# Patient Record
Sex: Male | Born: 1935 | Race: White | Hispanic: No | Smoking: Former smoker
Health system: Southern US, Community
[De-identification: ages and names within clinical notes are randomized; demographics above are authoritative.]

## PROBLEM LIST (undated history)

## (undated) DIAGNOSIS — M1990 Unspecified osteoarthritis, unspecified site: Secondary | ICD-10-CM

## (undated) DIAGNOSIS — R1310 Dysphagia, unspecified: Secondary | ICD-10-CM

## (undated) DIAGNOSIS — I4891 Unspecified atrial fibrillation: Secondary | ICD-10-CM

## (undated) DIAGNOSIS — N400 Enlarged prostate without lower urinary tract symptoms: Secondary | ICD-10-CM

## (undated) DIAGNOSIS — E079 Disorder of thyroid, unspecified: Secondary | ICD-10-CM

## (undated) DIAGNOSIS — Z972 Presence of dental prosthetic device (complete) (partial): Secondary | ICD-10-CM

## (undated) DIAGNOSIS — E785 Hyperlipidemia, unspecified: Secondary | ICD-10-CM

## (undated) DIAGNOSIS — I10 Essential (primary) hypertension: Secondary | ICD-10-CM

## (undated) DIAGNOSIS — G20 Parkinson's disease: Secondary | ICD-10-CM

## (undated) DIAGNOSIS — I509 Heart failure, unspecified: Secondary | ICD-10-CM

## (undated) DIAGNOSIS — M4800 Spinal stenosis, site unspecified: Secondary | ICD-10-CM

## (undated) DIAGNOSIS — Z973 Presence of spectacles and contact lenses: Secondary | ICD-10-CM

## (undated) DIAGNOSIS — F0390 Unspecified dementia without behavioral disturbance: Secondary | ICD-10-CM

## (undated) DIAGNOSIS — N289 Disorder of kidney and ureter, unspecified: Secondary | ICD-10-CM

## (undated) DIAGNOSIS — G20A1 Parkinson's disease without dyskinesia, without mention of fluctuations: Secondary | ICD-10-CM

## (undated) HISTORY — DX: Unspecified osteoarthritis, unspecified site: M19.90

## (undated) HISTORY — DX: Unspecified atrial fibrillation: I48.91

## (undated) HISTORY — PX: PACEMAKER INSERTION: SHX728

## (undated) HISTORY — PX: TONSILLECTOMY: SUR1361

## (undated) HISTORY — DX: Essential (primary) hypertension: I10

## (undated) HISTORY — PX: EYE SURGERY: SHX253

## (undated) HISTORY — DX: Heart failure, unspecified: I50.9

## (undated) HISTORY — DX: Disorder of kidney and ureter, unspecified: N28.9

## (undated) HISTORY — PX: OTHER SURGICAL HISTORY: SHX169

---

## 2009-07-09 ENCOUNTER — Encounter: Payer: Self-pay | Admitting: Internal Medicine

## 2009-07-10 ENCOUNTER — Encounter: Payer: Self-pay | Admitting: Internal Medicine

## 2009-12-27 ENCOUNTER — Encounter (INDEPENDENT_AMBULATORY_CARE_PROVIDER_SITE_OTHER): Payer: Self-pay | Admitting: *Deleted

## 2010-02-09 ENCOUNTER — Emergency Department (HOSPITAL_COMMUNITY): Admission: EM | Admit: 2010-02-09 | Discharge: 2010-02-09 | Payer: Self-pay | Admitting: Emergency Medicine

## 2010-02-19 ENCOUNTER — Ambulatory Visit: Payer: Self-pay | Admitting: Internal Medicine

## 2010-02-19 DIAGNOSIS — N259 Disorder resulting from impaired renal tubular function, unspecified: Secondary | ICD-10-CM | POA: Insufficient documentation

## 2010-02-19 DIAGNOSIS — I4891 Unspecified atrial fibrillation: Secondary | ICD-10-CM | POA: Insufficient documentation

## 2010-02-19 DIAGNOSIS — Z950 Presence of cardiac pacemaker: Secondary | ICD-10-CM | POA: Insufficient documentation

## 2010-02-19 LAB — CONVERTED CEMR LAB
BUN: 22 mg/dL (ref 6–23)
CO2: 30 meq/L (ref 19–32)
Calcium: 8.5 mg/dL (ref 8.4–10.5)
Chloride: 105 meq/L (ref 96–112)
Creatinine, Ser: 1.1 mg/dL (ref 0.4–1.5)
Digitoxin Lvl: <0.1 ng/mL — ABNORMAL LOW (ref 0.8–2.0)
GFR calc non Af Amer: 65.44 mL/min (ref >60)
Glucose, Bld: 124 mg/dL — ABNORMAL HIGH (ref 70–99)
Potassium: 4.4 meq/L (ref 3.5–5.1)
Sodium: 138 meq/L (ref 135–145)

## 2010-02-21 ENCOUNTER — Telehealth: Payer: Self-pay | Admitting: Internal Medicine

## 2010-03-28 ENCOUNTER — Ambulatory Visit: Payer: Self-pay | Admitting: Internal Medicine

## 2010-06-27 ENCOUNTER — Ambulatory Visit: Payer: Self-pay | Admitting: Internal Medicine

## 2010-08-27 NOTE — Assessment & Plan Note (Signed)
Summary: nep/ pt has device/ gd   CC:  new patient.  Pacemaker check.  Pt says that Dr. Sharyn Lull started him on carvedilol once daily.  History of Present Illness: [name] is seen at the reqeust of Dr Sharyn Lull  to establish device follouwp for pacer implant undertaken in 2001 for atrial fibrillation and bradycardia. He underwent generator replacement in 2006 with a [employer] device.  He has hd no symptoms of CHF although he was told by the [place] that he had CHF.  A recent echo by Dr Sharyn Lull showed impaired EF, 30-35% just recently.  He is able to climb 3 flts of stairs twice, denies edema, palpitations , lightheadedness or presyncope.  He has had no chest pressure.  He takes coumadin for TE risk reduction.    He has had a stress test by history, but never a cath.  Denies prior MI  Dr. Annitta Jersey notes were reviewed  Current Medications (verified): 1)  Apap 325 Mg Tabs (Acetaminophen) .... Take 2 Tablets As Needed For Pain 2)  Tramadol Hcl 50 Mg Tabs (Tramadol Hcl) .... 2 To 4 Tablets As Needed For Pain 3)  Folic Acid 400 Mcg Tabs (Folic Acid) .... Once Daily 4)  Finasteride 5 Mg Tabs (Finasteride) .... Take One Tablet Once Daily 5)  Tamsulosin Hcl 0.4 Mg Caps (Tamsulosin Hcl) .... Once Daily 6)  Wellbutrin 100 Mg Tabs (Bupropion Hcl) .... Take 2 Tablets Once Daily 7)  Fish Oil 1000 Mg Caps (Omega-3 Fatty Acids) .... Take One Capsule Two Times A Day 8)  Vitamin D3 5000 Unit Caps (Cholecalciferol) .... Once Daily 9)  Vitamin B-1 100 Mg Tabs (Thiamine Hcl) .... Once Daily 10)  Vitamin B-12 500 Mcg Tabs (Cyanocobalamin) .... Once Daily 11)  Active-Q 200-30 Mg Caps (Ubiquinol-Vitamin C) .... Once Daily 12)  Lisinopril 10 Mg Tabs (Lisinopril) .... Take One Tablet Once Daily 13)  Spironolactone 25 Mg Tabs (Spironolactone) .... Once Daily 14)  Simvastatin 40 Mg Tabs (Simvastatin) .... Take One Tablet At Bedtime 15)  Warfarin Sodium 2 Mg Tabs (Warfarin Sodium) .... Uad 16)  Vitamin K 100 Mcg  Tabs (Vitamin K) .... Take One Tablet Once Daily 17)  Digoxin 0.125 Mg Tabs (Digoxin) .... Take One Tablet Once Daily 18)  Mirtazapine 15 Mg Tabs (Mirtazapine) .... Take 1/2 Tablet Once Daily 19)  Coreg 3.125 Mg Tabs (Carvedilol) .... Once Daily  Allergies (verified): 1)  ! * Amiodarone  Past History:  Family History: Last updated: 04-Jan-2010 Father deceased age 31--CVA Mother deceased old age  Social History: Last updated: 02/19/2010 Retired  Widowed no children Tobacco Use - Former.  Alcohol Use - yes-socially  Past Medical History: CHF atrial fibrillation HTN status post pacemaker arthrits, incl spinal stenosis renal insufficiency question degree  Past Surgical History: bilateral shoulder surgery tonsillectomy Prosthetic hip bilaterally R knee quadricep tendon repair  Social History: Retired  Widowed no children Tobacco Use - Former.  Alcohol Use - yes-socially  Review of Systems       full review of systems was negative apart from a history of present illness and past medical history except hemmorrhoids, remote issues with thyroid, and floaters  Vital Signs:  Patient profile:   77 year old male Height:      68 inches Weight:      174 pounds BMI:     26.55 Pulse rate:   60 / minute Pulse rhythm:   irregular BP sitting:   117 / 73  (right arm) Cuff size:  regular  Vitals Entered By: Judithe Modest CMA (February 19, 2010 2:49 PM)  Physical Exam  General:  Well developed, well nourished, in no acute distress. Head:  HEENT Neck:  supple without thyromegaly Chest Wall:  without CVA tenderness or scoliosis Lungs:  clear to auscultation Heart:  irregular rate and rhythm with no significant murmurs or gallop Abdomen:  soft nontender without hepatomegaly or midline pulsation; no abdominal bruit Msk:  normal gait; impaired motion of his arms bilaterally Extremities:  without clubbing cyanosis or edema Neurologic:  alert and oriented grossly normal motor and   sensory function Skin:  warm and dry Cervical Nodes:  no lynmphadenopathy Psych:  affect normal     PPM Specifications Following MD:  Sherryl Manges, MD     PPM Vendor:  [facility] [employer]     PPM Model Number:  [id number]     PPM Serial Number:  [id number] PPM DOI:  03/07/2005     PPM Implanting MD:  NOT IMPLANTED BY [place]  Lead 1    Location: RV     DOI: 10/27/1998     Model #: [id number]     Serial #: [id number]     Status: active  Magnet Response Rate:  BOL 100 ERI  85  Indications:  A-fib   PPM Follow Up Remote Check?  No Battery Voltage:  good V     Battery Est. Longevity:  5 years     Pacer Dependent:  No     Right Ventricle  Amplitude: 8.2 mV, Impedance: 430 ohms, Threshold: 0.7 V at 0.4 msec  Episodes Coumadin:  Yes Ventricular Pacing:  58%  Parameters Mode:  SSIR     Lower Rate Limit:  60     Upper Rate Limit:  120 Next Cardiology Appt Due:  07/28/2010 Tech Comments:  No parameter changes.  The last 2 high rate episodes recorded in July of this year were noise on the lead which I was unable to reproduce.  We will set him up for TTM's with [employer] every 3 months and see him back in the clinic in 6 months. Altha Harm, LPN  February 19, 2010 3:23 PM   Impression & Recommendations:  Problem # 1:  PACEMAKER, PERMANENT (ICD-V45.01) tpatient has atrial fibrillation and bradycardia. He is status post pacemaker implantation and revision in 2006. There's an episode in July 2010 associated with high frequency artifactual signals. I suspect this represents external exposure to EMI as opposed to oversensing from an nternal signal, ie mypotential.  the patient is alerted to let us know if he has symptoms of lightheadedness. Otherwise the device functions normally.  He is 58% ventricularly paced and heart rate excursion is not too excessive  Problem # 2:  ATRIAL FIBRILLATION (ICD-427.31) Pt has permanent atrial fibrillation for whichhe takes coumadin(but also VitK ????)  becasue of difficulties with INR  control.  I [facility] as an alternative His updated medication list for this problem includes:    Warfarin Sodium 2 Mg Tabs (Warfarin sodium) ..... Uad    Digoxin 0.125 Mg Tabs (Digoxin) .Marland Kitchen... Take one tablet once daily    Coreg 3.125 Mg Tabs (Carvedilol) .Marland Kitchen... Take 1 tablet twice daily for 2 weeks until you see dr Sharyn Lull, then increase to 2 tablets twice daily  Problem # 3:  RENAL INSUFFICIENCY (ICD-588.9) With hthis history, we will check his BMET and Dig level.  I am also concerned about the aldactone ace with this  history.   Orders: TLB-BMP (Basic Metabolic Panel-BMET) (80048-METABOL) TLB-Digoxin (Lanoxin) (80162-DIG)  Problem # 4:  CARDIOMYOPATHY-PRESUMED NONISCHEMIC (ICD-425.4) the patient has a presumed nonischemic cardio myopathy with recent ejection fraction of 30-35%.  we sent a long time discussing the advantages of medications for risk reduction. In this regard we will further uptitrate his carvedilol to twice a day and then to 6.25 twice a day in anticipation is seeing Dr. [facility] again.  given his age and the just recent addition of beta blocker therapy, ICD upgrade is not an appropriate discussion His updated medication list for this problem includes:    Lisinopril 10 Mg Tabs (Lisinopril) .Marland Kitchen... Take one tablet once daily    Spironolactone 25 Mg Tabs (Spironolactone) ..... Once daily    Warfarin Sodium 2 Mg Tabs (Warfarin sodium) ..... Uad    Digoxin 0.125 Mg Tabs (Digoxin) .Marland Kitchen... Take one tablet once daily    Coreg 3.125 Mg Tabs (Carvedilol) .Marland Kitchen... Take 1 tablet twice daily for 2 weeks until you see dr Sharyn Lull, then increase to 2 tablets twice daily  Problem # 5:  BRADYCARDIA (ICD-427.81) as above His updated medication list for this problem includes:    Lisinopril 10 Mg Tabs (Lisinopril) .Marland Kitchen... Take one tablet once daily    Warfarin Sodium 2 Mg Tabs (Warfarin sodium) ..... Uad    Coreg 3.125 Mg Tabs (Carvedilol) .Marland Kitchen... Take 1  tablet twice daily for 2 weeks until you see dr Sharyn Lull, then increase to 2 tablets twice daily  Patient Instructions: 1)  Your physician has recommended you make the following change in your medication: Increase Carvedilol to 3.125mg  1 tablet twice daily for 2 weeks until you see Dr Sharyn Lull, then increase to 2 tablets twice daily.  Change Simvastatin to Pravastatin 20mg  1 tablet daily. 2)  Your physician recommends that you return for lab work today. 3)  Your physician wants you to follow-up in: 12 months with Dr Graciela Husbands.  You will receive a reminder letter in the mail two months in advance. If you don't receive a letter, please call our office to schedule the follow-up appointment. Prescriptions: DIGOXIN 0.125 MG TABS (DIGOXIN) take one tablet once daily  #30 x 11   Entered by:   Optometrist BSN   Authorized by:   Nathen May, MD, [facility]   Signed by:   Gypsy Balsam RN BSN on 02/19/2010   Method used:   Electronically to        [facility]  [phone]* (retail)       [address]       [place], [place]  [address]       Ph: [phone] or [phone]       Fax: [phone]   RxID:   [id number] SPIRONOLACTONE 25 MG TABS (SPIRONOLACTONE) once daily  #30 x 11   Entered by:   Optometrist BSN   Authorized by:   Nathen May, MD, [facility]   Signed by:   Gypsy Balsam RN BSN on 02/19/2010   Method used:   Electronically to        [facility]  [phone]* (retail)       [address]       [place], [place]  [address]       Ph: [phone] or [phone]       Fax: [phone]   RxID:   [id number] LISINOPRIL 10  MG TABS (LISINOPRIL) take one tablet once daily  #30 x 11   Entered by:   Optometrist BSN   Authorized by:   Nathen May, MD, [facility]   Signed by:   Gypsy Balsam RN BSN on 02/19/2010   Method used:   Electronically to        [facility]  [phone]* (retail)       [address]       [place], [place]  [address]       Ph: [phone] or [phone]       Fax: [phone]   RxID:   [id number] PRAVASTATIN SODIUM 20 MG TABS (PRAVASTATIN SODIUM) Take one tablet by mouth daily at bedtime  #30 x 11   Entered by:   Optometrist BSN   Authorized by:   Nathen May, MD, [facility]   Signed by:   Gypsy Balsam RN BSN on 02/19/2010   Method used:   Electronically to        [facility]  [phone]* (retail)       [address]       [place], [place]  [address]       Ph: [phone] or [phone]       Fax: [phone]   RxID:   [id number] COREG 3.125 MG TABS (CARVEDILOL) Take 1 tablet twice daily for 2 weeks until you see Dr Sharyn Lull, then increase to 2 tablets twice daily  #120 x 2   Entered by:   Optometrist BSN   Authorized by:   Nathen May, MD, [facility]   Signed by:   Gypsy Balsam RN BSN on 02/19/2010   Method used:   Electronically to        [facility]  [phone]* (retail)       [address]       [place], [place]  [address]       Ph: [phone] or [phone]       Fax: [phone]   RxID:   [id number]

## 2010-08-27 NOTE — Letter (Signed)
Summary: Appointment - Missed  [facility], Main Office  [address]   [place], [place] [address]   Phone: [phone]  Fax: [phone]     December 27, 2009 MRN: [id number]   [name] [address] [place], [place]  [address]   Dear [name],  Our records indicate you missed your appointment on 12-20-09 with Dr Graciela Husbands. It is very important that we reach you to reschedule this appointment. We look forward to participating in your health care needs. Please contact us at the number listed above at your earliest convenience to reschedule this appointment.     Sincerely,   Ruel Favors Scheduling Team

## 2010-08-27 NOTE — Cardiovascular Report (Signed)
Summary: TTM   TTM   Imported By: Roderic Ovens 04/24/2010 11:32:50  _____________________________________________________________________  External Attachment:    Type:   Image     Comment:   External Document

## 2010-08-27 NOTE — Cardiovascular Report (Signed)
Summary: TTM   TTM   Imported By: Roderic Ovens 05/08/2010 16:10:24  _____________________________________________________________________  External Attachment:    Type:   Image     Comment:   External Document

## 2010-08-27 NOTE — Progress Notes (Signed)
Summary: [name] regarding medication-lm  Phone Note Call from Patient Call back at [facility] Phone [phone]   Caller: Patient Summary of Call: Pt have question about medication Initial call taken by: Judie Grieve,  February 21, 2010 2:30 PM  Follow-up for Phone Call        [facility] for pt to call us back Dennis Bast, RN, BSN  February 21, 2010 4:54 PM  Additional Follow-up for Phone Call Additional follow up Details #1::        LMTCB Lisabeth Devoid RN BSN February 22, 2010 @ 0840 [name] called back to verify dosages of his current medications ( Lisinopril, spironolactone, & Acetaminophen).  Med list updated. Lisabeth Devoid RN    New/Updated Medications: APAP 325 MG TABS (ACETAMINOPHEN) take 2 tablets in the morning and 2 tablets in the afternoon daily. LISINOPRIL 10 MG TABS (LISINOPRIL) take 1/2 tablet daily SPIRONOLACTONE 25 MG TABS (SPIRONOLACTONE) Take 1/2 tablet daily.

## 2010-08-27 NOTE — Consult Note (Signed)
Summary: [facility]   Imported By: Marylou Mccoy 12/28/2009 15:41:34  _____________________________________________________________________  External Attachment:    Type:   Image     Comment:   External Document

## 2010-08-29 NOTE — Cardiovascular Report (Signed)
Summary: TTM   TTM   Imported By: Roderic Ovens 07/12/2010 15:10:03  _____________________________________________________________________  External Attachment:    Type:   Image     Comment:   External Document

## 2010-09-26 ENCOUNTER — Encounter: Payer: Self-pay | Admitting: Internal Medicine

## 2010-09-26 DIAGNOSIS — I4891 Unspecified atrial fibrillation: Secondary | ICD-10-CM

## 2010-10-12 LAB — DIFFERENTIAL
Basophils Absolute: 0 10*3/uL (ref 0.0–0.1)
Basophils Relative: 0 % (ref 0–1)
Eosinophils Absolute: 0 10*3/uL (ref 0.0–0.7)
Eosinophils Relative: 0 % (ref 0–5)
Lymphocytes Relative: 15 % (ref 12–46)
Lymphs Abs: 0.8 10*3/uL (ref 0.7–4.0)
Monocytes Absolute: 0.3 10*3/uL (ref 0.1–1.0)
Monocytes Relative: 5 % (ref 3–12)
Neutro Abs: 4.5 10*3/uL (ref 1.7–7.7)
Neutrophils Relative %: 80 % — ABNORMAL HIGH (ref 43–77)

## 2010-10-12 LAB — COMPREHENSIVE METABOLIC PANEL
ALT: 23 U/L (ref 0–53)
AST: 31 U/L (ref 0–37)
Albumin: 3.8 g/dL (ref 3.5–5.2)
Alkaline Phosphatase: 71 U/L (ref 39–117)
BUN: 18 mg/dL (ref 6–23)
CO2: 30 mEq/L (ref 19–32)
Calcium: 8.9 mg/dL (ref 8.4–10.5)
Chloride: 103 mEq/L (ref 96–112)
Creatinine, Ser: 1.11 mg/dL (ref 0.4–1.5)
GFR calc Af Amer: >60 mL/min (ref >60)
GFR calc non Af Amer: >60 mL/min (ref >60)
Glucose, Bld: 133 mg/dL — ABNORMAL HIGH (ref 70–99)
Potassium: 4 mEq/L (ref 3.5–5.1)
Sodium: 138 mEq/L (ref 135–145)
Total Bilirubin: 0.9 mg/dL (ref 0.3–1.2)
Total Protein: 7.2 g/dL (ref 6.0–8.3)

## 2010-10-12 LAB — CBC
HCT: 36.2 % — ABNORMAL LOW (ref 39.0–52.0)
Hemoglobin: 12.6 g/dL — ABNORMAL LOW (ref 13.0–17.0)
MCH: 34 pg (ref 26.0–34.0)
MCHC: 34.7 g/dL (ref 30.0–36.0)
MCV: 98.1 fL (ref 78.0–100.0)
Platelets: 155 10*3/uL (ref 150–400)
RBC: 3.69 MIL/uL — ABNORMAL LOW (ref 4.22–5.81)
RDW: 13.4 % (ref 11.5–15.5)
WBC: 5.7 10*3/uL (ref 4.0–10.5)

## 2010-10-12 LAB — URINALYSIS, ROUTINE W REFLEX MICROSCOPIC
Bilirubin Urine: NEGATIVE
Glucose, UA: NEGATIVE mg/dL
Ketones, ur: NEGATIVE mg/dL
Leukocytes, UA: NEGATIVE
Nitrite: NEGATIVE
Protein, ur: NEGATIVE mg/dL
Specific Gravity, Urine: 1.016 (ref 1.005–1.030)
Urobilinogen, UA: 0.2 mg/dL (ref 0.0–1.0)
pH: 7.5 (ref 5.0–8.0)

## 2010-10-12 LAB — URINE MICROSCOPIC-ADD ON

## 2010-10-12 LAB — URINE CULTURE
Colony Count: NO GROWTH
Culture: NO GROWTH

## 2010-10-15 NOTE — Cardiovascular Report (Signed)
Summary: TTM   TTM   Imported By: Roderic Ovens 10/07/2010 13:50:46  _____________________________________________________________________  External Attachment:    Type:   Image     Comment:   External Document

## 2010-10-16 ENCOUNTER — Ambulatory Visit: Payer: Medicare Other

## 2010-11-29 ENCOUNTER — Other Ambulatory Visit (HOSPITAL_COMMUNITY): Payer: Self-pay | Admitting: Cardiology

## 2010-12-09 ENCOUNTER — Encounter (HOSPITAL_COMMUNITY)
Admission: RE | Admit: 2010-12-09 | Discharge: 2010-12-09 | Disposition: A | Discharge status: Home / Self Care | Payer: Medicare Other | Source: Ambulatory Visit | Attending: Cardiology | Admitting: Cardiology

## 2010-12-09 ENCOUNTER — Ambulatory Visit (HOSPITAL_COMMUNITY): Payer: Medicare Other

## 2010-12-09 DIAGNOSIS — R55 Syncope and collapse: Secondary | ICD-10-CM | POA: Insufficient documentation

## 2010-12-09 DIAGNOSIS — Z950 Presence of cardiac pacemaker: Secondary | ICD-10-CM | POA: Insufficient documentation

## 2010-12-09 DIAGNOSIS — R079 Chest pain, unspecified: Secondary | ICD-10-CM | POA: Insufficient documentation

## 2010-12-09 DIAGNOSIS — R0602 Shortness of breath: Secondary | ICD-10-CM | POA: Insufficient documentation

## 2010-12-09 MED ORDER — TECHNETIUM TC 99M TETROFOSMIN IV KIT
30.0000 | PACK | Freq: Once | INTRAVENOUS | Status: AC | PRN
  Administered 2010-12-09: 30 via INTRAVENOUS

## 2010-12-09 MED ORDER — TECHNETIUM TC 99M TETROFOSMIN IV KIT
10.0000 | PACK | Freq: Once | INTRAVENOUS | Status: AC | PRN
  Administered 2010-12-09: 10 via INTRAVENOUS

## 2010-12-26 ENCOUNTER — Encounter: Payer: Self-pay | Admitting: Internal Medicine

## 2010-12-26 DIAGNOSIS — I495 Sick sinus syndrome: Secondary | ICD-10-CM

## 2011-01-28 ENCOUNTER — Encounter: Payer: Self-pay | Admitting: Internal Medicine

## 2011-02-11 ENCOUNTER — Other Ambulatory Visit: Payer: Self-pay | Admitting: Internal Medicine

## 2011-02-26 ENCOUNTER — Other Ambulatory Visit: Payer: Self-pay | Admitting: Internal Medicine

## 2011-03-12 ENCOUNTER — Other Ambulatory Visit: Payer: Self-pay | Admitting: Internal Medicine

## 2011-03-26 ENCOUNTER — Other Ambulatory Visit: Payer: Self-pay | Admitting: Internal Medicine

## 2011-03-28 ENCOUNTER — Encounter: Payer: Medicare Other | Admitting: Internal Medicine

## 2011-05-13 ENCOUNTER — Encounter: Payer: Self-pay | Admitting: Internal Medicine

## 2011-05-13 ENCOUNTER — Ambulatory Visit (INDEPENDENT_AMBULATORY_CARE_PROVIDER_SITE_OTHER): Payer: Medicare Other | Admitting: Internal Medicine

## 2011-05-13 DIAGNOSIS — Z950 Presence of cardiac pacemaker: Secondary | ICD-10-CM

## 2011-05-13 DIAGNOSIS — I4891 Unspecified atrial fibrillation: Secondary | ICD-10-CM

## 2011-05-13 DIAGNOSIS — I495 Sick sinus syndrome: Secondary | ICD-10-CM

## 2011-05-13 LAB — PACEMAKER DEVICE OBSERVATION
BRDY-0002RV: 60 {beats}/min
BRDY-0003RV: 120 {beats}/min
DEVICE MODEL PM: 134223
RV LEAD AMPLITUDE: 8.1 mv
RV LEAD IMPEDENCE PM: 410 Ohm
RV LEAD THRESHOLD: 0.8 V
VENTRICULAR PACING PM: 72

## 2011-05-13 NOTE — Assessment & Plan Note (Signed)
The patient's device was interrogated.  The information was reviewed. No changes were made in the programming.    

## 2011-05-13 NOTE — Patient Instructions (Signed)
Your physician wants you to follow-up in: 1 year with Dr. Klein. You will receive a reminder letter in the mail two months in advance. If you don't receive a letter, please call our office to schedule the follow-up appointment.  Your physician recommends that you continue on your current medications as directed. Please refer to the Current Medication list given to you today.  

## 2011-05-13 NOTE — Assessment & Plan Note (Signed)
Permanent

## 2011-05-13 NOTE — Progress Notes (Signed)
HPI  [name] is a 77 y.o. male seen in followup for pacer implanted for tachybradycardia syndrome in 2001 with device generator replacement in 2006.  He has hd no symptoms of CHF although he was told by the [place] that he had CHF. A recent echo by Dr Sharyn Lull showed impaired EF, 30-35% just recently. He is able to climb 3 flts of stairs twice,  The patient denies chest pain, shortness of breath, nocturnal dyspnea, orthopnea or peripheral edema.  There have been no palpitations, lightheadedness or syncope.   He takes coumadin for TE risk reduction.  He has had a stress test by history, but never a cath. Denies prior MI      Past Medical History  Diagnosis Date  . CHF (congestive heart failure)   . Atrial fibrillation   . HTN (hypertension)   . Pacemaker     [employer]   . Arthritis     incl spinal stenosis  . Renal insufficiency     question degree    Past Surgical History  Procedure Date  . Bilateral shoulder surgery   . Tonsillectomy   . Prosthetic hip - bilaterally   . R knee quadricep tendon repair     Current Outpatient Prescriptions  Medication Sig Dispense Refill  . acetaminophen (TYLENOL) 325 MG tablet Take 650 mg by mouth 2 (two) times daily. 2 in AM 2 in afternoon       . buPROPion (WELLBUTRIN) 100 MG tablet Take 200 mg by mouth daily.        . carvedilol (COREG) 3.125 MG tablet Take 2 tablets (6.25 mg total) by mouth 2 (two) times daily with a meal.  122 tablet  3  . Cholecalciferol (VITAMIN D-3) 5000 UNITS TABS Take 1 tablet by mouth daily.        . digoxin (LANOXIN) 0.125 MG tablet TAKE ONE TABLET BY MOUTH EVERY DAY  30 tablet  12  . finasteride (PROSCAR) 5 MG tablet Take 5 mg by mouth daily.        Marland Kitchen lisinopril (PRINIVIL,ZESTRIL) 10 MG tablet TAKE ONE TABLET BY MOUTH EVERY DAY  30 tablet  6  . mirtazapine (REMERON) 15 MG tablet Take 7.5 mg by mouth daily.        . Omega-3 Fatty Acids (FISH OIL) 1000 MG CAPS Take 1 capsule by mouth 2 (two) times  daily.        . pravastatin (PRAVACHOL) 20 MG tablet Take 20 mg by mouth at bedtime.        Marland Kitchen spironolactone (ALDACTONE) 25 MG tablet TAKE ONE TABLET BY MOUTH EVERY DAY  30 tablet  6  . Tamsulosin HCl (FLOMAX) 0.4 MG CAPS Take 0.4 mg by mouth daily.        . Thiamine HCl (VITAMIN B-1) 100 MG tablet Take 100 mg by mouth daily.        . traMADol (ULTRAM) 50 MG tablet Take 100-200 mg by mouth every 6 (six) hours as needed.        . vitamin B-12 (CYANOCOBALAMIN) 500 MCG tablet Take 500 mcg by mouth daily.        Marland Kitchen warfarin (COUMADIN) 2 MG tablet Take 2 mg by mouth daily. UAD         Allergies  Allergen Reactions  . Amiodarone     REACTION: fluid on lungs    Review of Systems negative except from HPI and PMH  Physical Exam Well developed and well nourished in no acute distress HENT  normal E scleral and icterus clear Neck Supple JVP flat; carotids brisk and full Clear to ausculation Irregular rate  and rhythm, no murmurs gallops or rub Soft with active bowel sounds No clubbing cyanosis and edema Alert and oriented, grossly normal motor and sensory function Skin Warm and Dry   Assessment and  Plan

## 2011-05-15 ENCOUNTER — Telehealth: Payer: Self-pay | Admitting: Internal Medicine

## 2011-05-15 NOTE — Telephone Encounter (Signed)
LMTC. I am not certain if he was just calling to clarify this med.

## 2011-05-15 NOTE — Telephone Encounter (Signed)
Pt called back with name of med is spironolactone He was here yesterday

## 2011-06-26 ENCOUNTER — Encounter: Payer: Self-pay | Admitting: Internal Medicine

## 2011-06-26 DIAGNOSIS — I495 Sick sinus syndrome: Secondary | ICD-10-CM

## 2011-08-20 ENCOUNTER — Other Ambulatory Visit: Payer: Self-pay | Admitting: Cardiology

## 2011-09-01 DIAGNOSIS — Z7901 Long term (current) use of anticoagulants: Secondary | ICD-10-CM | POA: Diagnosis not present

## 2011-09-05 DIAGNOSIS — I10 Essential (primary) hypertension: Secondary | ICD-10-CM | POA: Diagnosis not present

## 2011-09-05 DIAGNOSIS — E7800 Pure hypercholesterolemia, unspecified: Secondary | ICD-10-CM | POA: Diagnosis not present

## 2011-09-05 DIAGNOSIS — I4891 Unspecified atrial fibrillation: Secondary | ICD-10-CM | POA: Diagnosis not present

## 2011-09-05 DIAGNOSIS — I509 Heart failure, unspecified: Secondary | ICD-10-CM | POA: Diagnosis not present

## 2011-09-18 DIAGNOSIS — Z7901 Long term (current) use of anticoagulants: Secondary | ICD-10-CM | POA: Diagnosis not present

## 2011-09-19 DIAGNOSIS — I4891 Unspecified atrial fibrillation: Secondary | ICD-10-CM | POA: Diagnosis not present

## 2011-09-19 DIAGNOSIS — D649 Anemia, unspecified: Secondary | ICD-10-CM | POA: Diagnosis not present

## 2011-09-19 DIAGNOSIS — I509 Heart failure, unspecified: Secondary | ICD-10-CM | POA: Diagnosis not present

## 2011-09-19 DIAGNOSIS — I10 Essential (primary) hypertension: Secondary | ICD-10-CM | POA: Diagnosis not present

## 2011-09-25 ENCOUNTER — Encounter: Payer: Self-pay | Admitting: Internal Medicine

## 2011-09-25 DIAGNOSIS — I495 Sick sinus syndrome: Secondary | ICD-10-CM | POA: Diagnosis not present

## 2011-09-29 DIAGNOSIS — I4891 Unspecified atrial fibrillation: Secondary | ICD-10-CM | POA: Diagnosis not present

## 2011-09-29 DIAGNOSIS — D649 Anemia, unspecified: Secondary | ICD-10-CM | POA: Diagnosis not present

## 2011-09-29 DIAGNOSIS — I10 Essential (primary) hypertension: Secondary | ICD-10-CM | POA: Diagnosis not present

## 2011-10-13 ENCOUNTER — Encounter (HOSPITAL_COMMUNITY): Payer: Self-pay | Admitting: Emergency Medicine

## 2011-10-13 ENCOUNTER — Inpatient Hospital Stay (HOSPITAL_COMMUNITY)
Admission: EM | Admit: 2011-10-13 | Discharge: 2011-10-20 | DRG: 086 | Disposition: A | Discharge status: Nursing Home (Includes ICF) | Payer: Medicare Other | Attending: Cardiology | Admitting: Cardiology

## 2011-10-13 ENCOUNTER — Other Ambulatory Visit: Payer: Self-pay

## 2011-10-13 ENCOUNTER — Emergency Department (HOSPITAL_COMMUNITY): Payer: Medicare Other

## 2011-10-13 DIAGNOSIS — D638 Anemia in other chronic diseases classified elsewhere: Secondary | ICD-10-CM | POA: Diagnosis present

## 2011-10-13 DIAGNOSIS — N183 Chronic kidney disease, stage 3 unspecified: Secondary | ICD-10-CM | POA: Diagnosis present

## 2011-10-13 DIAGNOSIS — I10 Essential (primary) hypertension: Secondary | ICD-10-CM | POA: Diagnosis not present

## 2011-10-13 DIAGNOSIS — Z96649 Presence of unspecified artificial hip joint: Secondary | ICD-10-CM | POA: Diagnosis not present

## 2011-10-13 DIAGNOSIS — I129 Hypertensive chronic kidney disease with stage 1 through stage 4 chronic kidney disease, or unspecified chronic kidney disease: Secondary | ICD-10-CM | POA: Diagnosis present

## 2011-10-13 DIAGNOSIS — R55 Syncope and collapse: Secondary | ICD-10-CM

## 2011-10-13 DIAGNOSIS — I609 Nontraumatic subarachnoid hemorrhage, unspecified: Secondary | ICD-10-CM | POA: Diagnosis not present

## 2011-10-13 DIAGNOSIS — K5900 Constipation, unspecified: Secondary | ICD-10-CM | POA: Diagnosis present

## 2011-10-13 DIAGNOSIS — I5022 Chronic systolic (congestive) heart failure: Secondary | ICD-10-CM | POA: Diagnosis not present

## 2011-10-13 DIAGNOSIS — Z950 Presence of cardiac pacemaker: Secondary | ICD-10-CM | POA: Diagnosis not present

## 2011-10-13 DIAGNOSIS — J984 Other disorders of lung: Secondary | ICD-10-CM | POA: Diagnosis not present

## 2011-10-13 DIAGNOSIS — I4891 Unspecified atrial fibrillation: Secondary | ICD-10-CM

## 2011-10-13 DIAGNOSIS — S0180XA Unspecified open wound of other part of head, initial encounter: Secondary | ICD-10-CM | POA: Diagnosis present

## 2011-10-13 DIAGNOSIS — I428 Other cardiomyopathies: Secondary | ICD-10-CM | POA: Diagnosis present

## 2011-10-13 DIAGNOSIS — Z87891 Personal history of nicotine dependence: Secondary | ICD-10-CM

## 2011-10-13 DIAGNOSIS — I619 Nontraumatic intracerebral hemorrhage, unspecified: Secondary | ICD-10-CM | POA: Diagnosis not present

## 2011-10-13 DIAGNOSIS — S066X0A Traumatic subarachnoid hemorrhage without loss of consciousness, initial encounter: Principal | ICD-10-CM | POA: Diagnosis present

## 2011-10-13 DIAGNOSIS — M47812 Spondylosis without myelopathy or radiculopathy, cervical region: Secondary | ICD-10-CM | POA: Diagnosis not present

## 2011-10-13 DIAGNOSIS — R220 Localized swelling, mass and lump, head: Secondary | ICD-10-CM | POA: Diagnosis not present

## 2011-10-13 DIAGNOSIS — IMO0002 Reserved for concepts with insufficient information to code with codable children: Secondary | ICD-10-CM | POA: Diagnosis not present

## 2011-10-13 DIAGNOSIS — E7800 Pure hypercholesterolemia, unspecified: Secondary | ICD-10-CM | POA: Diagnosis not present

## 2011-10-13 DIAGNOSIS — R5381 Other malaise: Secondary | ICD-10-CM | POA: Diagnosis not present

## 2011-10-13 DIAGNOSIS — W19XXXA Unspecified fall, initial encounter: Secondary | ICD-10-CM | POA: Diagnosis present

## 2011-10-13 DIAGNOSIS — I517 Cardiomegaly: Secondary | ICD-10-CM | POA: Diagnosis not present

## 2011-10-13 DIAGNOSIS — H532 Diplopia: Secondary | ICD-10-CM | POA: Diagnosis present

## 2011-10-13 DIAGNOSIS — M1990 Unspecified osteoarthritis, unspecified site: Secondary | ICD-10-CM | POA: Diagnosis present

## 2011-10-13 DIAGNOSIS — I629 Nontraumatic intracranial hemorrhage, unspecified: Secondary | ICD-10-CM | POA: Diagnosis not present

## 2011-10-13 DIAGNOSIS — R42 Dizziness and giddiness: Secondary | ICD-10-CM | POA: Diagnosis not present

## 2011-10-13 DIAGNOSIS — R4789 Other speech disturbances: Secondary | ICD-10-CM | POA: Diagnosis not present

## 2011-10-13 DIAGNOSIS — R209 Unspecified disturbances of skin sensation: Secondary | ICD-10-CM | POA: Diagnosis not present

## 2011-10-13 DIAGNOSIS — R404 Transient alteration of awareness: Secondary | ICD-10-CM | POA: Diagnosis not present

## 2011-10-13 DIAGNOSIS — R221 Localized swelling, mass and lump, neck: Secondary | ICD-10-CM | POA: Diagnosis not present

## 2011-10-13 LAB — CBC
HCT: 36.7 % — ABNORMAL LOW (ref 39.0–52.0)
HCT: 33 % — ABNORMAL LOW (ref 39.0–52.0)
Hemoglobin: 12.6 g/dL — ABNORMAL LOW (ref 13.0–17.0)
Hemoglobin: 11.1 g/dL — ABNORMAL LOW (ref 13.0–17.0)
MCH: 32.6 pg (ref 26.0–34.0)
MCH: 32.4 pg (ref 26.0–34.0)
MCHC: 34.3 g/dL (ref 30.0–36.0)
MCHC: 33.6 g/dL (ref 30.0–36.0)
MCV: 95.1 fL (ref 78.0–100.0)
MCV: 96.2 fL (ref 78.0–100.0)
Platelets: 148 10*3/uL — ABNORMAL LOW (ref 150–400)
Platelets: 141 10*3/uL — ABNORMAL LOW (ref 150–400)
RBC: 3.86 MIL/uL — ABNORMAL LOW (ref 4.22–5.81)
RBC: 3.43 MIL/uL — ABNORMAL LOW (ref 4.22–5.81)
RDW: 13.9 % (ref 11.5–15.5)
RDW: 14.2 % (ref 11.5–15.5)
WBC: 6.1 10*3/uL (ref 4.0–10.5)
WBC: 6.5 10*3/uL (ref 4.0–10.5)

## 2011-10-13 LAB — DIFFERENTIAL
Basophils Absolute: 0 10*3/uL (ref 0.0–0.1)
Basophils Absolute: 0 10*3/uL (ref 0.0–0.1)
Basophils Relative: 0 % (ref 0–1)
Basophils Relative: 0 % (ref 0–1)
Eosinophils Absolute: 0 10*3/uL (ref 0.0–0.7)
Eosinophils Absolute: 0.1 10*3/uL (ref 0.0–0.7)
Eosinophils Relative: 1 % (ref 0–5)
Eosinophils Relative: 1 % (ref 0–5)
Lymphocytes Relative: 14 % (ref 12–46)
Lymphocytes Relative: 15 % (ref 12–46)
Lymphs Abs: 0.8 10*3/uL (ref 0.7–4.0)
Lymphs Abs: 1 10*3/uL (ref 0.7–4.0)
Monocytes Absolute: 0.3 10*3/uL (ref 0.1–1.0)
Monocytes Absolute: 0.4 10*3/uL (ref 0.1–1.0)
Monocytes Relative: 6 % (ref 3–12)
Monocytes Relative: 6 % (ref 3–12)
Neutro Abs: 4.8 10*3/uL (ref 1.7–7.7)
Neutro Abs: 5.1 10*3/uL (ref 1.7–7.7)
Neutrophils Relative %: 80 % — ABNORMAL HIGH (ref 43–77)
Neutrophils Relative %: 79 % — ABNORMAL HIGH (ref 43–77)

## 2011-10-13 LAB — COMPREHENSIVE METABOLIC PANEL
ALT: 13 U/L (ref 0–53)
AST: 20 U/L (ref 0–37)
Albumin: 3.3 g/dL — ABNORMAL LOW (ref 3.5–5.2)
Alkaline Phosphatase: 57 U/L (ref 39–117)
BUN: 25 mg/dL — ABNORMAL HIGH (ref 6–23)
CO2: 26 mEq/L (ref 19–32)
Calcium: 9.2 mg/dL (ref 8.4–10.5)
Chloride: 106 mEq/L (ref 96–112)
Creatinine, Ser: 1.37 mg/dL — ABNORMAL HIGH (ref 0.50–1.35)
GFR calc Af Amer: 53 mL/min — ABNORMAL LOW (ref >90)
GFR calc non Af Amer: 46 mL/min — ABNORMAL LOW (ref >90)
Glucose, Bld: 163 mg/dL — ABNORMAL HIGH (ref 70–99)
Potassium: 4 mEq/L (ref 3.5–5.1)
Sodium: 141 mEq/L (ref 135–145)
Total Bilirubin: 0.4 mg/dL (ref 0.3–1.2)
Total Protein: 6.5 g/dL (ref 6.0–8.3)

## 2011-10-13 LAB — BASIC METABOLIC PANEL
BUN: 27 mg/dL — ABNORMAL HIGH (ref 6–23)
CO2: 24 mEq/L (ref 19–32)
Calcium: 9.7 mg/dL (ref 8.4–10.5)
Chloride: 105 mEq/L (ref 96–112)
Creatinine, Ser: 1.43 mg/dL — ABNORMAL HIGH (ref 0.50–1.35)
GFR calc Af Amer: 51 mL/min — ABNORMAL LOW (ref >90)
GFR calc non Af Amer: 44 mL/min — ABNORMAL LOW (ref >90)
Glucose, Bld: 130 mg/dL — ABNORMAL HIGH (ref 70–99)
Potassium: 4.7 mEq/L (ref 3.5–5.1)
Sodium: 140 mEq/L (ref 135–145)

## 2011-10-13 LAB — URINALYSIS, ROUTINE W REFLEX MICROSCOPIC
Bilirubin Urine: NEGATIVE
Glucose, UA: NEGATIVE mg/dL
Ketones, ur: 15 mg/dL — AB
Leukocytes, UA: NEGATIVE
Nitrite: NEGATIVE
Protein, ur: NEGATIVE mg/dL
Specific Gravity, Urine: 1.013 (ref 1.005–1.030)
Urobilinogen, UA: 0.2 mg/dL (ref 0.0–1.0)
pH: 7 (ref 5.0–8.0)

## 2011-10-13 LAB — PROTIME-INR
INR: 1.18 (ref 0.00–1.49)
Prothrombin Time: 15.2 seconds (ref 11.6–15.2)

## 2011-10-13 LAB — GLUCOSE, CAPILLARY: Glucose-Capillary: 119 mg/dL — ABNORMAL HIGH (ref 70–99)

## 2011-10-13 LAB — URINE MICROSCOPIC-ADD ON

## 2011-10-13 LAB — CARDIAC PANEL(CRET KIN+CKTOT+MB+TROPI)
CK, MB: 4 ng/mL (ref 0.3–4.0)
Relative Index: 1.7 (ref 0.0–2.5)
Total CK: 236 U/L — ABNORMAL HIGH (ref 7–232)
Troponin I: <0.3 ng/mL (ref <0.30)

## 2011-10-13 LAB — POCT I-STAT TROPONIN I: Troponin i, poc: 0.02 ng/mL (ref 0.00–0.08)

## 2011-10-13 LAB — MAGNESIUM: Magnesium: 2.1 mg/dL (ref 1.5–2.5)

## 2011-10-13 LAB — APTT: aPTT: 27 seconds (ref 24–37)

## 2011-10-13 MED ORDER — ASPIRIN 300 MG RE SUPP: 300.0000 mg | RECTAL | Status: AC

## 2011-10-13 MED ORDER — NITROGLYCERIN 0.4 MG SL SUBL: 0.4000 mg | SUBLINGUAL_TABLET | SUBLINGUAL | Status: DC | PRN

## 2011-10-13 MED ORDER — SODIUM CHLORIDE 0.9 % IV BOLUS (SEPSIS)
500.0000 mL | Freq: Once | INTRAVENOUS | Status: AC
  Administered 2011-10-13: 500 mL via INTRAVENOUS

## 2011-10-13 MED ORDER — B COMPLEX-C PO TABS
1.0000 | ORAL_TABLET | Freq: Every day | ORAL | Status: DC
  Administered 2011-10-13 – 2011-10-20 (×8): 1 via ORAL
  Filled 2011-10-13 (×9): qty 1

## 2011-10-13 MED ORDER — ADULT MULTIVITAMIN W/MINERALS CH
1.0000 | ORAL_TABLET | Freq: Every day | ORAL | Status: DC
  Administered 2011-10-13 – 2011-10-20 (×8): 1 via ORAL
  Filled 2011-10-13 (×8): qty 1

## 2011-10-13 MED ORDER — WARFARIN - PHARMACIST DOSING INPATIENT
Freq: Every day | Status: DC
  Administered 2011-10-14: 17:00:00

## 2011-10-13 MED ORDER — WARFARIN SODIUM 4 MG PO TABS
4.0000 mg | ORAL_TABLET | Freq: Once | ORAL | Status: AC
  Administered 2011-10-13: 4 mg via ORAL
  Filled 2011-10-13: qty 1

## 2011-10-13 MED ORDER — SIMVASTATIN 20 MG PO TABS
20.0000 mg | ORAL_TABLET | Freq: Every day | ORAL | Status: DC
  Administered 2011-10-14 – 2011-10-20 (×7): 20 mg via ORAL
  Filled 2011-10-13 (×8): qty 1

## 2011-10-13 MED ORDER — OMEGA-3-ACID ETHYL ESTERS 1 G PO CAPS
1.0000 g | ORAL_CAPSULE | Freq: Two times a day (BID) | ORAL | Status: DC
  Administered 2011-10-13 – 2011-10-20 (×14): 1 g via ORAL
  Filled 2011-10-13 (×15): qty 1

## 2011-10-13 MED ORDER — VITAMIN D-3 125 MCG (5000 UT) PO TABS
1.0000 | ORAL_TABLET | Freq: Every day | ORAL | Status: DC
  Filled 2011-10-13 (×3): qty 1

## 2011-10-13 MED ORDER — SODIUM CHLORIDE 0.9 % IV SOLN
INTRAVENOUS | Status: DC
  Administered 2011-10-13: 21:00:00 via INTRAVENOUS
  Administered 2011-10-15: 20 mL/h via INTRAVENOUS
  Administered 2011-10-17: 16:00:00 via INTRAVENOUS
  Administered 2011-10-19: 500 mL via INTRAVENOUS

## 2011-10-13 MED ORDER — VITAMIN B-1 100 MG PO TABS
100.0000 mg | ORAL_TABLET | Freq: Every day | ORAL | Status: DC
  Administered 2011-10-13 – 2011-10-20 (×8): 100 mg via ORAL
  Filled 2011-10-13 (×9): qty 1

## 2011-10-13 MED ORDER — ASPIRIN EC 81 MG PO TBEC
81.0000 mg | DELAYED_RELEASE_TABLET | Freq: Every day | ORAL | Status: DC
  Administered 2011-10-14 – 2011-10-15 (×2): 81 mg via ORAL
  Filled 2011-10-13 (×2): qty 1

## 2011-10-13 MED ORDER — ONDANSETRON HCL 4 MG/2ML IJ SOLN: 4.0000 mg | Freq: Four times a day (QID) | INTRAMUSCULAR | Status: DC | PRN

## 2011-10-13 MED ORDER — ACETAMINOPHEN 325 MG PO TABS
650.0000 mg | ORAL_TABLET | ORAL | Status: DC | PRN
  Administered 2011-10-14 – 2011-10-20 (×5): 650 mg via ORAL
  Filled 2011-10-13 (×5): qty 2

## 2011-10-13 MED ORDER — CARVEDILOL 6.25 MG PO TABS
6.2500 mg | ORAL_TABLET | Freq: Two times a day (BID) | ORAL | Status: DC
  Administered 2011-10-14 – 2011-10-20 (×12): 6.25 mg via ORAL
  Filled 2011-10-13 (×14): qty 1

## 2011-10-13 MED ORDER — ASPIRIN 81 MG PO CHEW
324.0000 mg | CHEWABLE_TABLET | ORAL | Status: AC
  Administered 2011-10-13: 324 mg via ORAL
  Filled 2011-10-13: qty 4

## 2011-10-13 MED ORDER — VITAMIN D3 25 MCG (1000 UNIT) PO TABS
5000.0000 [IU] | ORAL_TABLET | Freq: Every day | ORAL | Status: DC
  Administered 2011-10-14 – 2011-10-20 (×7): 5000 [IU] via ORAL
  Filled 2011-10-13 (×8): qty 5

## 2011-10-13 MED ORDER — FINASTERIDE 5 MG PO TABS
5.0000 mg | ORAL_TABLET | Freq: Every day | ORAL | Status: DC
  Administered 2011-10-13 – 2011-10-20 (×8): 5 mg via ORAL
  Filled 2011-10-13 (×9): qty 1

## 2011-10-13 MED ORDER — TAMSULOSIN HCL 0.4 MG PO CAPS
0.4000 mg | ORAL_CAPSULE | Freq: Every day | ORAL | Status: DC
  Administered 2011-10-13 – 2011-10-20 (×8): 0.4 mg via ORAL
  Filled 2011-10-13 (×8): qty 1

## 2011-10-13 NOTE — ED Notes (Signed)
Patient transported to CT 

## 2011-10-13 NOTE — ED Provider Notes (Signed)
History     CSN: [id number]  Arrival date & time 10/13/11  1450   First MD Initiated Contact with Patient 10/13/11 1508      Chief Complaint  Patient presents with  . Loss of Consciousness    (Consider location/radiation/quality/duration/timing/severity/associated sxs/prior treatment) HPI Pt states he was visiting a friend at the [facility] (pt lives in [facility] but had driven to his friends to visit) when he states he had sudden onset lightheadedness and felt as if he was going to pass out.  He states he lowered himself down to the ground and believes he did pass out.  EMS stated that staff reported to them that the pt did have full LOC.  He was out for about 2-3 minutes.  Pt states that on awakening, he felt fine.  He denies any injury, has not had any pain or palpitations surrounding the event, including no cp.  Pt states that he fell at his nh 2 d ago and hit his head, mechanical fall, no LOC, has bruidsing and abrasion to left side of forehead and periorbital area.  Sx's moderate, no aggravating or alleviating factors.  Syncope associated with lightheadedness, no injury.  Past Medical History  Diagnosis Date  . CHF (congestive heart failure)   . Atrial fibrillation   . HTN (hypertension)   . Pacemaker     [employer]   . Arthritis     incl spinal stenosis  . Renal insufficiency     question degree    Past Surgical History  Procedure Date  . Bilateral shoulder surgery   . Tonsillectomy   . Prosthetic hip - bilaterally   . R knee quadricep tendon repair     History reviewed. No pertinent family history.  History  Substance Use Topics  . Smoking status: Former Smoker    Types: Cigarettes    Quit date: 05/13/1971  . Smokeless tobacco: Never Used  . Alcohol Use: Yes     occasional      Review of Systems  Constitutional: Negative for fever, chills and fatigue.  Respiratory: Negative for cough and shortness of breath.   Cardiovascular: Negative for  chest pain.  Gastrointestinal: Negative for nausea, vomiting, abdominal pain and diarrhea.  Genitourinary: Negative for dysuria and difficulty urinating.  Musculoskeletal: Negative for myalgias, back pain, joint swelling and arthralgias.  Skin: Negative for rash.  Neurological: Positive for syncope and light-headedness. Negative for speech difficulty, weakness, numbness and headaches.  All other systems reviewed and are negative.    Allergies  Amiodarone  Home Medications   No current outpatient prescriptions on file.  BP 92/42  Pulse 90  Temp(Src) 98 F (36.7 C) (Oral)  Resp 23  Ht 5\' 8"  (1.727 m)  Wt 160 lb 15 oz (73 kg)  BMI 24.47 kg/m2  SpO2 90%  Physical Exam  Nursing note and vitals reviewed. Constitutional: He appears well-developed and well-nourished.  HENT:  Head: Head is with abrasion and with contusion.    Eyes: Right eye exhibits no discharge. Left eye exhibits no discharge.  Neck: Normal range of motion. Neck supple.  Cardiovascular: Normal rate, regular rhythm and normal heart sounds.   Pulmonary/Chest: Effort normal and breath sounds normal.  Abdominal: Soft. There is no tenderness.  Musculoskeletal: Normal range of motion. He exhibits no tenderness.  Neurological: He is alert.  Skin: Skin is warm and dry.  Psychiatric: He has a normal mood and affect. His behavior is normal.    ED Course  Procedures (including critical care time)  Labs Reviewed  CBC - Abnormal; Notable for the following:    RBC 3.86 (*)    Hemoglobin 12.6 (*)    HCT 36.7 (*)    Platelets 148 (*)    All other components within normal limits  DIFFERENTIAL - Abnormal; Notable for the following:    Neutrophils Relative 80 (*)    All other components within normal limits  BASIC METABOLIC PANEL - Abnormal; Notable for the following:    Glucose, Bld 130 (*)    BUN 27 (*)    Creatinine, Ser 1.43 (*)    GFR calc non Af Amer 44 (*)    GFR calc Af Amer 51 (*)    All other  components within normal limits  URINALYSIS, ROUTINE W REFLEX MICROSCOPIC - Abnormal; Notable for the following:    Hgb urine dipstick TRACE (*)    Ketones, ur 15 (*)    All other components within normal limits  GLUCOSE, CAPILLARY - Abnormal; Notable for the following:    Glucose-Capillary 119 (*)    All other components within normal limits  POCT I-STAT TROPONIN I  URINE MICROSCOPIC-ADD ON  CARDIAC PANEL(CRET KIN+CKTOT+MB+TROPI)  CARDIAC PANEL(CRET KIN+CKTOT+MB+TROPI)  CARDIAC PANEL(CRET KIN+CKTOT+MB+TROPI)  PROTIME-INR  APTT  CBC  DIFFERENTIAL  TSH  COMPREHENSIVE METABOLIC PANEL  MAGNESIUM  LIPID PANEL  CBC  BASIC METABOLIC PANEL  PROTIME-INR   Ct Head Wo Contrast (if Head Trauma)  10/13/2011  **ADDENDUM** CREATED: 10/13/2011 16:47:32  9 mm sclerotic focus within the left aspect of the C7 vertebral body may be a bone island.  Small sclerotic metastatic lesion not excluded in the proper clinical setting.  Carotid bifurcation calcifications.  **END ADDENDUM** SIGNED BY: Almedia Balls. Constance Goltz, M.D.    10/13/2011  *RADIOLOGY REPORT*  Clinical Data:  Loss of consciousness.  CT HEAD WITHOUT CONTRAST CT CERVICAL SPINE WITHOUT CONTRAST  Technique:  Multidetector CT imaging of the head and cervical spine was performed following the standard protocol without intravenous contrast.  Multiplanar CT image reconstructions of the cervical spine were also generated.  Comparison:   None  CT HEAD  Findings: Soft tissue swelling left lateral orbital region.  No underlying fracture or intracranial hemorrhage.  Atrophy without hydrocephalus.  No CT evidence of large acute infarct.  No intracranial mass lesion detected on this unenhanced exam.  IMPRESSION: No skull fracture or intracranial hemorrhage.  Soft tissue swelling lateral left orbital region.  CT CERVICAL SPINE  Findings: Rotation with prominent degenerative changes slightly limit evaluation.  No fracture of the cervical spine noted.  No abnormal  prevertebral soft tissue swelling.  Prominent transverse ligament hypertrophy.  IMPRESSION: No cervical spine fracture noted.  Evaluation slightly limited by rotation and prominent degenerative changes.  Original Report Authenticated By: Fuller Canada, M.D.   Ct Cervical Spine Wo Contrast  10/13/2011  **ADDENDUM** CREATED: 10/13/2011 16:47:32  9 mm sclerotic focus within the left aspect of the C7 vertebral body may be a bone island.  Small sclerotic metastatic lesion not excluded in the proper clinical setting.  Carotid bifurcation calcifications.  **END ADDENDUM** SIGNED BY: Almedia Balls. Constance Goltz, M.D.    10/13/2011  *RADIOLOGY REPORT*  Clinical Data:  Loss of consciousness.  CT HEAD WITHOUT CONTRAST CT CERVICAL SPINE WITHOUT CONTRAST  Technique:  Multidetector CT imaging of the head and cervical spine was performed following the standard protocol without intravenous contrast.  Multiplanar CT image reconstructions of the cervical spine were also generated.  Comparison:   None  CT HEAD  Findings: Soft tissue swelling left lateral orbital region.  No underlying fracture or intracranial hemorrhage.  Atrophy without hydrocephalus.  No CT evidence of large acute infarct.  No intracranial mass lesion detected on this unenhanced exam.  IMPRESSION: No skull fracture or intracranial hemorrhage.  Soft tissue swelling lateral left orbital region.  CT CERVICAL SPINE  Findings: Rotation with prominent degenerative changes slightly limit evaluation.  No fracture of the cervical spine noted.  No abnormal prevertebral soft tissue swelling.  Prominent transverse ligament hypertrophy.  IMPRESSION: No cervical spine fracture noted.  Evaluation slightly limited by rotation and prominent degenerative changes.  Original Report Authenticated By: Fuller Canada, M.D.   Dg Chest Port 1 View  10/13/2011  *RADIOLOGY REPORT*  Clinical Data: Syncopal episode  PORTABLE CHEST - 1 VIEW  Comparison: None.  Findings: Dual lead pacemaker is in  place from a left subclavian approach.  Leads are somewhat unusually positioned, possibly in the region of the right ventricular outflow tract.  The heart is mildly enlarged.  There is atherosclerosis of the aorta.  No evidence of heart failure or effusion.  There is mild pulmonary scarring in the left upper lobe.  IMPRESSION: No active cardiopulmonary disease evident.  Mild cardiomegaly. Atherosclerosis.  Dual lead pacemaker with leads projected over the right ventricular outflow tract region.  Original Report Authenticated By: Thomasenia Sales, M.D.     No diagnosis found.    MDM  Pt is in nad, afvss, nontoxic appearing, exam and hx as above.  Pt here after syncopal episode associated with lightheadedness.  Had fall 2 d ago with head injury.  Story from ems unclear if pt hit head today.  Getting ct head and c spine.  Pt has [employer] pacemaker that was placed in 2006, have called to have the pacer interrogated.  Considered PE but pt no tachycardic, no sob, no cp, no leg swelling.  Pt has no s/sx of dehydration, no recent n/v/d, eating and drinking normally.  H/o chf last ef 30-35% (10/12).    9:40 PM [employer]chant [employer] officer interrogated pace and showed nothing acute specifically no VF/VT.  Pt has mild renal nsufficiency with Crt 1.43, trop wnl, h/h slightly low at 12.6/36.7 but orthostatics wnl and pt has no sob/fatigue to suggest this is the etiology.  Dr Sharyn Lull consulted, will admit, pt stable     Elijio Miles, MD 10/13/11 2141

## 2011-10-13 NOTE — H&P (Signed)
[name] is an 78 y.o. male.   Chief Complaint  syncope HPI: Patient is 78 year old male with past medical history significant for multiple medical problems i.e. hypertension chronic atrial fibrillation history of systolic heart failure next sinus syndrome status post permanent pacemaker degenerative joint disease history of spinal stenosis hypercholesteremia chronic renal insufficiency stage III came to the ER via EMS following syncopal episode. Patient states he was visiting his friend at [facility] while walking there he felt lightheaded and felt that he will pass out he sat down 100 floor and suddenly had syncopal episode and then he woke up noticed ambulance was there. Patient denies any palpitations prior to syncopal episode denies any chest pain nausea vomiting. Denies any weakness in the arms or legs. Denies any seizure activity. Denies such episodes of syncope in the past. Patient had single chamber pacemaker which was interpreted as normal device function with no episodes of VT of V. fib. By [employer] . Patient states he had fall last Friday her when he tripped in the bathroom. Consistent small laceration over his forehead and ecchymosis around left periorbital area. No history of syncope then.  Past Medical History  Diagnosis Date  . CHF (congestive heart failure)   . Atrial fibrillation   . HTN (hypertension)   . Pacemaker     [employer]   . Arthritis     incl spinal stenosis  . Renal insufficiency     question degree    Past Surgical History  Procedure Date  . Bilateral shoulder surgery   . Tonsillectomy   . Prosthetic hip - bilaterally   . R knee quadricep tendon repair     History reviewed. No pertinent family history. Social History:  reports that he quit smoking about 40 years ago. His smoking use included Cigarettes. He has never used smokeless tobacco. He reports that he drinks alcohol. He reports that he does not use illicit  drugs.  Allergies:  Allergies  Allergen Reactions  . Amiodarone     REACTION: fluid on lungs    Medications Prior to Admission  Medication Dose Route Frequency Provider Last Rate Last Dose  . sodium chloride 0.9 % bolus 500 mL  500 mL Intravenous Once Elijio Miles, MD   500 mL at 10/13/11 1626   Medications Prior to Admission  Medication Sig Dispense Refill  . acetaminophen (TYLENOL) 325 MG tablet Take 650 mg by mouth 2 (two) times daily. 2 in AM 2 in afternoon       . carvedilol (COREG) 3.125 MG tablet Take 2 tablets (6.25 mg total) by mouth 2 (two) times daily with a meal.  122 tablet  3  . Cholecalciferol (VITAMIN D-3) 5000 UNITS TABS Take 1 tablet by mouth daily.        . digoxin (LANOXIN) 0.125 MG tablet TAKE ONE TABLET BY MOUTH EVERY DAY  30 tablet  12  . finasteride (PROSCAR) 5 MG tablet Take 5 mg by mouth daily.        Marland Kitchen lisinopril (PRINIVIL,ZESTRIL) 10 MG tablet TAKE ONE TABLET BY MOUTH EVERY DAY  30 tablet  6  . Omega-3 Fatty Acids (FISH OIL) 1000 MG CAPS Take 1 capsule by mouth 3 (three) times daily.       . pravastatin (PRAVACHOL) 20 MG tablet Take 20 mg by mouth at bedtime.        Marland Kitchen spironolactone (ALDACTONE) 25 MG tablet TAKE ONE TABLET BY MOUTH EVERY DAY  30 tablet  6  .  Tamsulosin HCl (FLOMAX) 0.4 MG CAPS Take 0.4 mg by mouth daily.        . Thiamine HCl (VITAMIN B-1) 100 MG tablet Take 100 mg by mouth daily.        . traMADol (ULTRAM) 50 MG tablet Take 100 mg by mouth 2 (two) times daily.       Marland Kitchen warfarin (COUMADIN) 2 MG tablet Take 2 mg by mouth daily. 1 tablet on Sunday, Tuesday, Wednesday, Friday, and Saturday 1/2 tablet on Monday and Thursday        Results for orders placed during the hospital encounter of 10/13/11 (from the past 48 hour(s))  CBC     Status: Abnormal   Collection Time   10/13/11  3:37 PM      Component Value Range Comment   WBC 6.1  4.0 - 10.5 (K/uL)    RBC 3.86 (*) 4.22 - 5.81 (MIL/uL)    Hemoglobin 12.6 (*) 13.0 - 17.0 (g/dL)    HCT 16.1  (*) 09.6 - 52.0 (%)    MCV 95.1  78.0 - 100.0 (fL)    MCH 32.6  26.0 - 34.0 (pg)    MCHC 34.3  30.0 - 36.0 (g/dL)    RDW 04.5  40.9 - 81.1 (%)    Platelets 148 (*) 150 - 400 (K/uL)   DIFFERENTIAL     Status: Abnormal   Collection Time   10/13/11  3:37 PM      Component Value Range Comment   Neutrophils Relative 80 (*) 43 - 77 (%)    Neutro Abs 4.8  1.7 - 7.7 (K/uL)    Lymphocytes Relative 14  12 - 46 (%)    Lymphs Abs 0.8  0.7 - 4.0 (K/uL)    Monocytes Relative 6  3 - 12 (%)    Monocytes Absolute 0.3  0.1 - 1.0 (K/uL)    Eosinophils Relative 1  0 - 5 (%)    Eosinophils Absolute 0.0  0.0 - 0.7 (K/uL)    Basophils Relative 0  0 - 1 (%)    Basophils Absolute 0.0  0.0 - 0.1 (K/uL)   BASIC METABOLIC PANEL     Status: Abnormal   Collection Time   10/13/11  3:37 PM      Component Value Range Comment   Sodium 140  135 - 145 (mEq/L)    Potassium 4.7  3.5 - 5.1 (mEq/L)    Chloride 105  96 - 112 (mEq/L)    CO2 24  19 - 32 (mEq/L)    Glucose, Bld 130 (*) 70 - 99 (mg/dL)    BUN 27 (*) 6 - 23 (mg/dL)    Creatinine, Ser 9.14 (*) 0.50 - 1.35 (mg/dL)    Calcium 9.7  8.4 - 10.5 (mg/dL)    GFR calc non Af Amer 44 (*) >90 (mL/min)    GFR calc Af Amer 51 (*) >90 (mL/min)   POCT I-STAT TROPONIN I     Status: Normal   Collection Time   10/13/11  3:51 PM      Component Value Range Comment   Troponin i, poc 0.02  0.00 - 0.08 (ng/mL)    Comment 3            GLUCOSE, CAPILLARY     Status: Abnormal   Collection Time   10/13/11  4:47 PM      Component Value Range Comment   Glucose-Capillary 119 (*) 70 - 99 (mg/dL)    Ct Head Wo Contrast (if  Head Trauma)  10/13/2011  **ADDENDUM** CREATED: 10/13/2011 16:47:32  9 mm sclerotic focus within the left aspect of the C7 vertebral body may be a bone island.  Small sclerotic metastatic lesion not excluded in the proper clinical setting.  Carotid bifurcation calcifications.  **END ADDENDUM** SIGNED BY: Almedia Balls. Constance Goltz, M.D.    10/13/2011  *RADIOLOGY REPORT*   Clinical Data:  Loss of consciousness.  CT HEAD WITHOUT CONTRAST CT CERVICAL SPINE WITHOUT CONTRAST  Technique:  Multidetector CT imaging of the head and cervical spine was performed following the standard protocol without intravenous contrast.  Multiplanar CT image reconstructions of the cervical spine were also generated.  Comparison:   None  CT HEAD  Findings: Soft tissue swelling left lateral orbital region.  No underlying fracture or intracranial hemorrhage.  Atrophy without hydrocephalus.  No CT evidence of large acute infarct.  No intracranial mass lesion detected on this unenhanced exam.  IMPRESSION: No skull fracture or intracranial hemorrhage.  Soft tissue swelling lateral left orbital region.  CT CERVICAL SPINE  Findings: Rotation with prominent degenerative changes slightly limit evaluation.  No fracture of the cervical spine noted.  No abnormal prevertebral soft tissue swelling.  Prominent transverse ligament hypertrophy.  IMPRESSION: No cervical spine fracture noted.  Evaluation slightly limited by rotation and prominent degenerative changes.  Original Report Authenticated By: Fuller Canada, M.D.   Ct Cervical Spine Wo Contrast  10/13/2011  **ADDENDUM** CREATED: 10/13/2011 16:47:32  9 mm sclerotic focus within the left aspect of the C7 vertebral body may be a bone island.  Small sclerotic metastatic lesion not excluded in the proper clinical setting.  Carotid bifurcation calcifications.  **END ADDENDUM** SIGNED BY: Almedia Balls. Constance Goltz, M.D.    10/13/2011  *RADIOLOGY REPORT*  Clinical Data:  Loss of consciousness.  CT HEAD WITHOUT CONTRAST CT CERVICAL SPINE WITHOUT CONTRAST  Technique:  Multidetector CT imaging of the head and cervical spine was performed following the standard protocol without intravenous contrast.  Multiplanar CT image reconstructions of the cervical spine were also generated.  Comparison:   None  CT HEAD  Findings: Soft tissue swelling left lateral orbital region.  No underlying  fracture or intracranial hemorrhage.  Atrophy without hydrocephalus.  No CT evidence of large acute infarct.  No intracranial mass lesion detected on this unenhanced exam.  IMPRESSION: No skull fracture or intracranial hemorrhage.  Soft tissue swelling lateral left orbital region.  CT CERVICAL SPINE  Findings: Rotation with prominent degenerative changes slightly limit evaluation.  No fracture of the cervical spine noted.  No abnormal prevertebral soft tissue swelling.  Prominent transverse ligament hypertrophy.  IMPRESSION: No cervical spine fracture noted.  Evaluation slightly limited by rotation and prominent degenerative changes.  Original Report Authenticated By: Fuller Canada, M.D.   Dg Chest Port 1 View  10/13/2011  *RADIOLOGY REPORT*  Clinical Data: Syncopal episode  PORTABLE CHEST - 1 VIEW  Comparison: None.  Findings: Dual lead pacemaker is in place from a left subclavian approach.  Leads are somewhat unusually positioned, possibly in the region of the right ventricular outflow tract.  The heart is mildly enlarged.  There is atherosclerosis of the aorta.  No evidence of heart failure or effusion.  There is mild pulmonary scarring in the left upper lobe.  IMPRESSION: No active cardiopulmonary disease evident.  Mild cardiomegaly. Atherosclerosis.  Dual lead pacemaker with leads projected over the right ventricular outflow tract region.  Original Report Authenticated By: Thomasenia Sales, M.D.    Review of Systems  Constitutional:  Negative for fever, chills and weight loss.  HENT: Negative for ear pain.   Eyes: Negative for blurred vision, double vision and photophobia.  Respiratory: Negative for cough, hemoptysis and sputum production.   Cardiovascular: Negative for chest pain, palpitations and orthopnea.  Gastrointestinal: Negative for heartburn, nausea, vomiting and abdominal pain.  Genitourinary: Negative for dysuria.  Skin: Negative for rash.  Neurological: Positive for dizziness. Negative  for tingling, tremors, focal weakness, seizures and headaches.    Blood pressure 109/59, pulse 63, temperature 98 F (36.7 C), temperature source Oral, resp. rate 15, SpO2 94.00%. Physical Exam  Constitutional: He is oriented to person, place, and time. He appears well-developed.  HENT:  Head: Normocephalic.       Left orbital area is small laceration and ecchymosis noted  Eyes: Conjunctivae are normal. Left eye exhibits no discharge. No scleral icterus.  Neck: Neck supple. No JVD present. No tracheal deviation present. No thyromegaly present.  Cardiovascular: Normal rate and regular rhythm.        Soft systolic murmur noted no S3 gallop  Respiratory: Effort normal and breath sounds normal. No respiratory distress. He has no wheezes.  GI: Bowel sounds are normal. He exhibits no distension. There is no rebound.  Musculoskeletal: He exhibits no edema and no tenderness.  Lymphadenopathy:    He has no cervical adenopathy.  Neurological: He is alert and oriented to person, place, and time. No cranial nerve deficit.     Assessment/Plan Status post syncope rule out cardiac arrhythmias rule out orthostatic hypotension rule out coronary insufficiency Nonischemic cardiomyopathy Chronic atrial fibrillation Hypertension Hypercholesteremia Chronic kidney disease stage III Anemia of chronic disease Plan Check CT of brain duplex ultrasound of carotids 2-D echo Check serial enzymes and EKG OT PT consult Coumadin per pharmacy   [facility] N 10/13/2011, 6:48 PM

## 2011-10-13 NOTE — ED Notes (Signed)
Patient is unable to void at this time 

## 2011-10-13 NOTE — ED Notes (Signed)
This patient is unable to void at this time.  Patient is unable to stand to do orthostatics.  He is still in a neckbrace and states he is very uncomfortable.Marland Kitchen

## 2011-10-13 NOTE — ED Notes (Addendum)
Pt brought in via EMS for syncope with LOC. Incident witnessed by nursing home staff where pt was a visitor. Pt states he got dizzy and lowered himself to the floor. Pt has a hematoma around the left eye that was present prior to this incident, pt can not recall how this happened. CBG 118 per EMS

## 2011-10-13 NOTE — ED Notes (Signed)
Dinner tray ordered. Heart healthy diet. 

## 2011-10-13 NOTE — Progress Notes (Signed)
ANTICOAGULATION CONSULT NOTE - Initial Consult  Pharmacy Consult for Coumadin Indication: atrial fibrillation  Allergies  Allergen Reactions  . Amiodarone     REACTION: fluid on lungs    Patient Measurements: Height: 5\' 8"  (172.7 cm) Weight: 160 lb 15 oz (73 kg) IBW/kg (Calculated) : 68.4  Heparin Dosing Weight:   Vital Signs: Temp: 98 F (36.7 C) (03/18 2030) Temp src: Oral (03/18 2030) BP: 92/42 mmHg (03/18 2045) Pulse Rate: 90  (03/18 2045)  Labs:  Basename 10/13/11 2214 10/13/11 2153 10/13/11 1537  HGB -- 11.1* 12.6*  HCT -- 33.0* 36.7*  PLT -- 141* 148*  APTT -- 27 --  LABPROT -- 15.2 --  INR -- 1.18 --  HEPARINUNFRC -- -- --  CREATININE -- 1.37* 1.43*  CKTOTAL 236* -- --  CKMB 4.0 -- --  TROPONINI <0.30 -- --   Estimated Creatinine Clearance: 39.5 ml/min (by C-G formula based on Cr of 1.37).  Medical History: Past Medical History  Diagnosis Date  . CHF (congestive heart failure)   . Atrial fibrillation   . HTN (hypertension)   . Pacemaker     [employer]   . Arthritis     incl spinal stenosis  . Renal insufficiency     question degree    Medications:  Scheduled:    . aspirin  324 mg Oral NOW   Or  . aspirin  300 mg Rectal NOW  . aspirin EC  81 mg Oral Daily  . B-complex with vitamin C  1 tablet Oral Daily  . carvedilol  6.25 mg Oral BID WC  . finasteride  5 mg Oral Daily  . mulitivitamin with minerals  1 tablet Oral Daily  . omega-3 acid ethyl esters  1 g Oral BID  . simvastatin  20 mg Oral q1800  . sodium chloride  500 mL Intravenous Once  . Tamsulosin HCl  0.4 mg Oral Daily  . thiamine  100 mg Oral Daily  . Vitamin D-3  1 tablet Oral Daily  . warfarin  4 mg Oral Once  . Warfarin - Pharmacist Dosing Inpatient   Does not apply q1800    Assessment: 78 yr old male admitted after syncopal episode while visiting a friend at a nursing home. Pt has a history of a.fib and was on coumadin PTA, however, his INR was only 1,18. He was  taking 2 mg Sun, Tue, Wed, Fri, Sat and 1 mg on Mon and Thur. Per RN, pt is not in a.fib at present time. Goal of Therapy:  INR 2-3   Plan:  Will give 4 mg tonight. Ordered daily INR.   Eugene Garnet 10/13/2011,11:02 PM

## 2011-10-13 NOTE — ED Notes (Signed)
Attempted to phone floor report; receiving RN unable to take report at this time; left number for nurse to return call

## 2011-10-13 NOTE — ED Notes (Signed)
Gave patient urinal; waiting on inpatient bed

## 2011-10-13 NOTE — ED Notes (Signed)
2031-01 Ready 

## 2011-10-14 ENCOUNTER — Other Ambulatory Visit: Payer: Self-pay

## 2011-10-14 DIAGNOSIS — R55 Syncope and collapse: Secondary | ICD-10-CM

## 2011-10-14 LAB — CARDIAC PANEL(CRET KIN+CKTOT+MB+TROPI)
CK, MB: 3.1 ng/mL (ref 0.3–4.0)
CK, MB: 3 ng/mL (ref 0.3–4.0)
Relative Index: 1.5 (ref 0.0–2.5)
Relative Index: 1.5 (ref 0.0–2.5)
Total CK: 208 U/L (ref 7–232)
Total CK: 197 U/L (ref 7–232)
Troponin I: <0.3 ng/mL (ref <0.30)
Troponin I: <0.3 ng/mL (ref <0.30)

## 2011-10-14 LAB — BASIC METABOLIC PANEL
BUN: 26 mg/dL — ABNORMAL HIGH (ref 6–23)
CO2: 24 mEq/L (ref 19–32)
Calcium: 8.7 mg/dL (ref 8.4–10.5)
Chloride: 106 mEq/L (ref 96–112)
Creatinine, Ser: 1.35 mg/dL (ref 0.50–1.35)
GFR calc Af Amer: 54 mL/min — ABNORMAL LOW (ref >90)
GFR calc non Af Amer: 47 mL/min — ABNORMAL LOW (ref >90)
Glucose, Bld: 95 mg/dL (ref 70–99)
Potassium: 4 mEq/L (ref 3.5–5.1)
Sodium: 139 mEq/L (ref 135–145)

## 2011-10-14 LAB — CBC
HCT: 29.8 % — ABNORMAL LOW (ref 39.0–52.0)
Hemoglobin: 10.1 g/dL — ABNORMAL LOW (ref 13.0–17.0)
MCH: 32.3 pg (ref 26.0–34.0)
MCHC: 33.9 g/dL (ref 30.0–36.0)
MCV: 95.2 fL (ref 78.0–100.0)
Platelets: 133 10*3/uL — ABNORMAL LOW (ref 150–400)
RBC: 3.13 MIL/uL — ABNORMAL LOW (ref 4.22–5.81)
RDW: 14.2 % (ref 11.5–15.5)
WBC: 5.6 10*3/uL (ref 4.0–10.5)

## 2011-10-14 LAB — LIPID PANEL
Cholesterol: 152 mg/dL (ref 0–200)
HDL: 54 mg/dL (ref >39)
LDL Cholesterol: 78 mg/dL (ref 0–99)
Total CHOL/HDL Ratio: 2.8 RATIO
Triglycerides: 98 mg/dL (ref <150)
VLDL: 20 mg/dL (ref 0–40)

## 2011-10-14 LAB — PROTIME-INR
INR: 1.18 (ref 0.00–1.49)
Prothrombin Time: 15.2 seconds (ref 11.6–15.2)

## 2011-10-14 LAB — TSH: TSH: 2.449 u[IU]/mL (ref 0.350–4.500)

## 2011-10-14 LAB — HEPARIN LEVEL (UNFRACTIONATED): Heparin Unfractionated: 0.42 IU/mL (ref 0.30–0.70)

## 2011-10-14 MED ORDER — WARFARIN VIDEO: 1.0000 | Freq: Once | Status: DC

## 2011-10-14 MED ORDER — COUMADIN BOOK
1.0000 | Freq: Once | Status: AC
  Administered 2011-10-14: 1
  Filled 2011-10-14: qty 1

## 2011-10-14 MED ORDER — ZOLPIDEM TARTRATE 5 MG PO TABS
5.0000 mg | ORAL_TABLET | Freq: Every evening | ORAL | Status: DC | PRN
  Administered 2011-10-14 – 2011-10-19 (×6): 5 mg via ORAL
  Filled 2011-10-14 (×6): qty 1

## 2011-10-14 MED ORDER — WARFARIN SODIUM 4 MG PO TABS
4.0000 mg | ORAL_TABLET | Freq: Once | ORAL | Status: AC
  Administered 2011-10-14: 4 mg via ORAL
  Filled 2011-10-14: qty 1

## 2011-10-14 MED ORDER — POLYVINYL ALCOHOL 1.4 % OP SOLN
1.0000 [drp] | OPHTHALMIC | Status: DC | PRN
  Administered 2011-10-14: 1 [drp] via OPHTHALMIC
  Filled 2011-10-14: qty 15

## 2011-10-14 MED ORDER — HEPARIN (PORCINE) IN NACL 100-0.45 UNIT/ML-% IJ SOLN
1000.0000 [IU]/h | INTRAMUSCULAR | Status: DC
  Administered 2011-10-14 – 2011-10-15 (×3): 1000 [IU]/h via INTRAVENOUS
  Filled 2011-10-14 (×3): qty 250

## 2011-10-14 MED ORDER — HEPARIN BOLUS VIA INFUSION
3000.0000 [IU] | Freq: Once | INTRAVENOUS | Status: AC
  Administered 2011-10-14: 3000 [IU] via INTRAVENOUS
  Filled 2011-10-14: qty 3000

## 2011-10-14 NOTE — Consult Note (Signed)
ANTICOAGULATION CONSULT NOTE - Initial Consult  Pharmacy Consult for: Coumadin, Begin Heparin bridging until INR therapeutic Indication: atrial fibrillation  Allergies  Allergen Reactions  . Amiodarone     REACTION: fluid on lungs    Patient Measurements: Height: 5\' 8"  (172.7 cm) Weight: 160 lb 15 oz (73 kg) IBW/kg (Calculated) : 68.4   Vital Signs: Temp: 99.3 F (37.4 C) (03/19 1136) Temp src: Oral (03/19 1136) BP: 105/62 mmHg (03/19 1136) Pulse Rate: 63  (03/19 1136)  Labs:  Alvira Philips 10/14/11 0913 10/14/11 0329 10/14/11 0328 10/13/11 2214 10/13/11 2153 10/13/11 1537  HGB -- 10.1* -- -- 11.1* --  HCT -- 29.8* -- -- 33.0* 36.7*  PLT -- 133* -- -- 141* 148*  APTT -- -- -- -- 27 --  LABPROT -- 15.2 -- -- 15.2 --  INR -- 1.18 -- -- 1.18 --  HEPARINUNFRC -- -- -- -- -- --  CREATININE -- 1.35 -- -- 1.37* 1.43*  CKTOTAL 197 -- 208 236* -- --  CKMB 3.0 -- 3.1 4.0 -- --  TROPONINI <0.30 -- <0.30 <0.30 -- --   Estimated Creatinine Clearance: 40.1 ml/min (by C-G formula based on Cr of 1.35).  Medical / Surgical History: Past Medical History  Diagnosis Date  . CHF (congestive heart failure)   . Atrial fibrillation   . HTN (hypertension)   . Pacemaker     [employer]   . Arthritis     incl spinal stenosis  . Renal insufficiency     question degree   Past Surgical History  Procedure Date  . Bilateral shoulder surgery   . Tonsillectomy   . Prosthetic hip - bilaterally   . R knee quadricep tendon repair     Medications:  Prescriptions prior to admission  Medication Sig Dispense Refill  . acetaminophen (TYLENOL) 325 MG tablet Take 650 mg by mouth 2 (two) times daily. 2 in AM 2 in afternoon       . B Complex-C (B-COMPLEX WITH VITAMIN C) tablet Take 1 tablet by mouth daily.      . carvedilol (COREG) 3.125 MG tablet Take 2 tablets (6.25 mg total) by mouth 2 (two) times daily with a meal.  122 tablet  3  . Cholecalciferol (VITAMIN D-3) 5000 UNITS TABS Take 1  tablet by mouth daily.        . digoxin (LANOXIN) 0.125 MG tablet TAKE ONE TABLET BY MOUTH EVERY DAY  30 tablet  12  . finasteride (PROSCAR) 5 MG tablet Take 5 mg by mouth daily.        Marland Kitchen lisinopril (PRINIVIL,ZESTRIL) 10 MG tablet TAKE ONE TABLET BY MOUTH EVERY DAY  30 tablet  6  . Multiple Vitamin (MULITIVITAMIN WITH MINERALS) TABS Take 1 tablet by mouth daily.      . Omega-3 Fatty Acids (FISH OIL) 1000 MG CAPS Take 1 capsule by mouth 3 (three) times daily.       . pravastatin (PRAVACHOL) 20 MG tablet Take 20 mg by mouth at bedtime.        Marland Kitchen spironolactone (ALDACTONE) 25 MG tablet TAKE ONE TABLET BY MOUTH EVERY DAY  30 tablet  6  . Tamsulosin HCl (FLOMAX) 0.4 MG CAPS Take 0.4 mg by mouth daily.        . Thiamine HCl (VITAMIN B-1) 100 MG tablet Take 100 mg by mouth daily.        . traMADol (ULTRAM) 50 MG tablet Take 100 mg by mouth 2 (two) times daily.       Marland Kitchen  warfarin (COUMADIN) 2 MG tablet Take 2 mg by mouth daily. 1 tablet on Sunday, Tuesday, Wednesday, Friday, and Saturday 1/2 tablet on Monday and Thursday       Scheduled:    . aspirin  324 mg Oral NOW   Or  . aspirin  300 mg Rectal NOW  . aspirin EC  81 mg Oral Daily  . B-complex with vitamin C  1 tablet Oral Daily  . carvedilol  6.25 mg Oral BID WC  . cholecalciferol  5,000 Units Oral Daily  . finasteride  5 mg Oral Daily  . mulitivitamin with minerals  1 tablet Oral Daily  . omega-3 acid ethyl esters  1 g Oral BID  . simvastatin  20 mg Oral q1800  . sodium chloride  500 mL Intravenous Once  . Tamsulosin HCl  0.4 mg Oral Daily  . thiamine  100 mg Oral Daily  . warfarin  4 mg Oral Once  . Warfarin - Pharmacist Dosing Inpatient   Does not apply q1800  . DISCONTD: Vitamin D-3  1 tablet Oral Daily   Infusions:    . sodium chloride 50 mL/hr at 10/13/11 2058    Assessment:  78 yr old male admitted after syncopal episode while visiting a friend at a nursing home. Pt has a history of a.fib and was on coumadin PTA.  INR on  admission SUBtherapeutic at 1.18.  Will need Heparin bridging until INR therapeutic.  Goal of Therapy:   INR 2-3  Heparin level 0.3-0.7 units/ml   Plan:   Repeat Coumadin 4 mg po today.  Heparin 3000 unit bolus, then begin Heparin infusion at 1000 units/hr.  Heparin level in 8 hours after Heparin started, then daily.  Markeise Mathews, Elisha Headland, Pharm.D. 10/14/2011 11:52 AM

## 2011-10-14 NOTE — ED Provider Notes (Signed)
I saw and evaluated the patient, reviewed the resident's note and I agree with the findings and plan.  Limits the patient for additional syncopal workup.  His pacemaker was interrogated and was without abnormality.  Lyanne Co, MD 10/14/11 [phone]

## 2011-10-14 NOTE — Progress Notes (Signed)
ANTICOAGULATION CONSULT NOTE - Follow Up Consult  Pharmacy Consult for Heparin Indication: atrial fibrillation  Allergies  Allergen Reactions  . Amiodarone     REACTION: fluid on lungs    Patient Measurements: Height: 5\' 8"  (172.7 cm) Weight: 160 lb 15 oz (73 kg) IBW/kg (Calculated) : 68.4   Vital Signs: Temp: 98.9 F (37.2 C) (03/19 2033) Temp src: Oral (03/19 2033) BP: 105/51 mmHg (03/19 2033) Pulse Rate: 73  (03/19 2033)  Labs:  Alvira Philips 10/14/11 2031 10/14/11 0913 10/14/11 0329 10/14/11 0328 10/13/11 2214 10/13/11 2153 10/13/11 1537  HGB -- -- 10.1* -- -- 11.1* --  HCT -- -- 29.8* -- -- 33.0* 36.7*  PLT -- -- 133* -- -- 141* 148*  APTT -- -- -- -- -- 27 --  LABPROT -- -- 15.2 -- -- 15.2 --  INR -- -- 1.18 -- -- 1.18 --  HEPARINUNFRC 0.42 -- -- -- -- -- --  CREATININE -- -- 1.35 -- -- 1.37* 1.43*  CKTOTAL -- 197 -- 208 236* -- --  CKMB -- 3.0 -- 3.1 4.0 -- --  TROPONINI -- <0.30 -- <0.30 <0.30 -- --   Estimated Creatinine Clearance: 40.1 ml/min (by C-G formula based on Cr of 1.35).   Medications:  Infusions:    . sodium chloride 20 mL/hr (10/14/11 1500)  . heparin 1,000 Units/hr (10/14/11 1341)    Assessment: Atrial Fibrillation: Initial heparin level is therapeutic.  Goal of Therapy:  Heparin level 0.3-0.7 units/ml   Plan:  Continue Heparin at 1000 units/hr Check AM Heparin level and CBC.  Estella Husk, Pharm.D., BCPS Clinical Pharmacist  Pager [phone] 10/14/2011, 9:43 PM

## 2011-10-14 NOTE — Progress Notes (Signed)
Subjective:  No further dizziness or lightheadedness. Patient denies any chest pain or shortness of breath. INR remains subtherapeutic start him on heparin2  Objective:  Vital Signs in the last 24 hours: Temp:  [98 F (36.7 C)-98.8 F (37.1 C)] 98.8 F (37.1 C) (03/19 0553) Pulse Rate:  [60-90] 60  (03/19 0553) Resp:  [11-23] 18  (03/19 0553) BP: (92-151)/(42-71) 105/53 mmHg (03/19 0553) SpO2:  [90 %-99 %] 96 % (03/19 0553) Weight:  [73 kg (160 lb 15 oz)] 73 kg (160 lb 15 oz) (03/18 2043)  Intake/Output from previous day: 03/18 0701 - 03/19 0700 In: 501.7 [I.V.:501.7] Out: 500 [Urine:500] Intake/Output from this shift:    Physical Exam: Neck: no adenopathy, no carotid bruit, no JVD and supple, symmetrical, trachea midline Lungs: clear to auscultation bilaterally Heart: regular rate and rhythm, S1, S2 normal and Soft systolic murmur noted Abdomen: soft, non-tender; bowel sounds normal; no masses,  no organomegaly Extremities: extremities normal, atraumatic, no cyanosis or edema  Lab Results:  [facility] 10/14/11 0329 10/13/11 2153  WBC 5.6 6.5  HGB 10.1* 11.1*  PLT 133* 141*    Basename 10/14/11 0329 10/13/11 2153  NA 139 141  K 4.0 4.0  CL 106 106  CO2 24 26  GLUCOSE 95 163*  BUN 26* 25*  CREATININE 1.35 1.37*    Basename 10/14/11 0913 10/14/11 0328  TROPONINI <0.30 <0.30   Hepatic Function Panel  Basename 10/13/11 2153  PROT 6.5  ALBUMIN 3.3*  AST 20  ALT 13  ALKPHOS 57  BILITOT 0.4  BILIDIR --  IBILI --    Basename 10/14/11 0329  CHOL 152   No results found for this basename: PROTIME in the last 72 hours  Imaging: Imaging results have been reviewed and Ct Head Wo Contrast (if Head Trauma)  10/13/2011  **ADDENDUM** CREATED: 10/13/2011 16:47:32  9 mm sclerotic focus within the left aspect of the C7 vertebral body may be a bone island.  Small sclerotic metastatic lesion not excluded in the proper clinical setting.  Carotid bifurcation calcifications.   **END ADDENDUM** SIGNED BY: Almedia Balls. Constance Goltz, M.D.    10/13/2011  *RADIOLOGY REPORT*  Clinical Data:  Loss of consciousness.  CT HEAD WITHOUT CONTRAST CT CERVICAL SPINE WITHOUT CONTRAST  Technique:  Multidetector CT imaging of the head and cervical spine was performed following the standard protocol without intravenous contrast.  Multiplanar CT image reconstructions of the cervical spine were also generated.  Comparison:   None  CT HEAD  Findings: Soft tissue swelling left lateral orbital region.  No underlying fracture or intracranial hemorrhage.  Atrophy without hydrocephalus.  No CT evidence of large acute infarct.  No intracranial mass lesion detected on this unenhanced exam.  IMPRESSION: No skull fracture or intracranial hemorrhage.  Soft tissue swelling lateral left orbital region.  CT CERVICAL SPINE  Findings: Rotation with prominent degenerative changes slightly limit evaluation.  No fracture of the cervical spine noted.  No abnormal prevertebral soft tissue swelling.  Prominent transverse ligament hypertrophy.  IMPRESSION: No cervical spine fracture noted.  Evaluation slightly limited by rotation and prominent degenerative changes.  Original Report Authenticated By: Fuller Canada, M.D.   Ct Cervical Spine Wo Contrast  10/13/2011  **ADDENDUM** CREATED: 10/13/2011 16:47:32  9 mm sclerotic focus within the left aspect of the C7 vertebral body may be a bone island.  Small sclerotic metastatic lesion not excluded in the proper clinical setting.  Carotid bifurcation calcifications.  **END ADDENDUM** SIGNED BY: Almedia Balls. Constance Goltz, M.D.  10/13/2011  *RADIOLOGY REPORT*  Clinical Data:  Loss of consciousness.  CT HEAD WITHOUT CONTRAST CT CERVICAL SPINE WITHOUT CONTRAST  Technique:  Multidetector CT imaging of the head and cervical spine was performed following the standard protocol without intravenous contrast.  Multiplanar CT image reconstructions of the cervical spine were also generated.  Comparison:   None   CT HEAD  Findings: Soft tissue swelling left lateral orbital region.  No underlying fracture or intracranial hemorrhage.  Atrophy without hydrocephalus.  No CT evidence of large acute infarct.  No intracranial mass lesion detected on this unenhanced exam.  IMPRESSION: No skull fracture or intracranial hemorrhage.  Soft tissue swelling lateral left orbital region.  CT CERVICAL SPINE  Findings: Rotation with prominent degenerative changes slightly limit evaluation.  No fracture of the cervical spine noted.  No abnormal prevertebral soft tissue swelling.  Prominent transverse ligament hypertrophy.  IMPRESSION: No cervical spine fracture noted.  Evaluation slightly limited by rotation and prominent degenerative changes.  Original Report Authenticated By: Fuller Canada, M.D.   Dg Chest Port 1 View  10/13/2011  *RADIOLOGY REPORT*  Clinical Data: Syncopal episode  PORTABLE CHEST - 1 VIEW  Comparison: None.  Findings: Dual lead pacemaker is in place from a left subclavian approach.  Leads are somewhat unusually positioned, possibly in the region of the right ventricular outflow tract.  The heart is mildly enlarged.  There is atherosclerosis of the aorta.  No evidence of heart failure or effusion.  There is mild pulmonary scarring in the left upper lobe.  IMPRESSION: No active cardiopulmonary disease evident.  Mild cardiomegaly. Atherosclerosis.  Dual lead pacemaker with leads projected over the right ventricular outflow tract region.  Original Report Authenticated By: Thomasenia Sales, M.D.    Cardiac Studies:  Assessment/Plan:  Status post syncope workup in progress Nonischemic cardiomyopathy Chronic atrial fibrillation Hypertension Hypercholesteremia Chronic kidney disease stage III Anemia of chronic disease Plan Start heparin per pharmacy protocol Continue Coumadin per pharmacy protocol Check 2-D echo and duplex ultrasound of carotids Check stool for occult blood Check CBC in a.m.  LOS: 1 day     Jaya Lapka N 10/14/2011, 10:56 AM

## 2011-10-14 NOTE — Progress Notes (Signed)
  Echocardiogram 2D Echocardiogram has been performed.  Taela Charbonneau Deneen 10/14/2011, 11:00 AM 

## 2011-10-14 NOTE — Clinical Documentation Improvement (Signed)
CHF DOCUMENTATION CLARIFICATION QUERY  THIS DOCUMENT IS NOT A PERMANENT PART OF THE MEDICAL RECORD  TO RESPOND TO THE THIS QUERY, FOLLOW THE INSTRUCTIONS BELOW:  1. If needed, update documentation for the patient's encounter via the notes activity.  2. Access this query again and click edit on the In [employer].  3. After updating, or not, click F2 to complete all highlighted (required) fields concerning your review. Select "additional documentation in the medical record" OR "no additional documentation provided".  4. Click Sign note button.  5. The deficiency will fall out of your In Basket *Please let us know if you are not able to complete this workflow by phone or e-mail (listed below).  Please update your documentation within the medical record to reflect your response to this query.                                                                                    10/14/11  Dear Dr.[facility],  In a better effort to capture your patient's severity of illness, reflect appropriate length of stay and utilization of resources, a review of the patient medical record has revealed the following indicators the diagnosis of Heart Failure.    Based on your clinical judgment, please clarify and document in a progress note and/or discharge summary the clinical condition associated with the following supporting information:  In responding to this query please exercise your independent judgment.  The fact that a query is asked, does not imply that any particular answer is desired or expected.  Possible Clinical Conditions?  Chronic Systolic Congestive Heart Failure  Chronic Systolic & Diastolic Congestive Heart Failure  Acute Systolic Congestive Heart Failure  Acute Systolic & Diastolic Congestive Heart Failure  Acute on Chronic Systolic Congestive Heart Failure  Acute on Chronic Systolic & Diastolic  Congestive Heart Failure  Other Condition Cannot Clinically  Determine    Risk Factors: Systolic heart failure noted per 3/18 H&P. Please document Acute vs Chronic in the progress notes and the discharge summary.  Treatment: Aldactone 25mg  daily included on home medication list.  Reviewed: No further documentation provided.                  Thank You,  Marciano Sequin,  Clinical Documentation Specialist:  Pager: [phone]  [facility] [employer] [facility] [place]

## 2011-10-14 NOTE — Progress Notes (Signed)
Pt removed IV. Heparin was stopped at for about 30 minutes at  2000-2045.

## 2011-10-14 NOTE — Progress Notes (Signed)
VASCULAR LAB PRELIMINARY  PRELIMINARY  PRELIMINARY  PRELIMINARY  Carotid duplex completed.    Preliminary report:  Right:  40-59% internal carotid artery stenosis upper end of scale. Left:  60-79% internal carotid artery stenosis lower end of scale.                                 Bilateral : Vertebral artery flow is antegrade  Taniqua Issa D, RVS 10/14/2011, 11:22 AM

## 2011-10-15 ENCOUNTER — Inpatient Hospital Stay (HOSPITAL_COMMUNITY): Payer: Medicare Other

## 2011-10-15 ENCOUNTER — Other Ambulatory Visit: Payer: Self-pay

## 2011-10-15 LAB — CBC
HCT: 31.7 % — ABNORMAL LOW (ref 39.0–52.0)
Hemoglobin: 10.7 g/dL — ABNORMAL LOW (ref 13.0–17.0)
MCH: 33 pg (ref 26.0–34.0)
MCHC: 33.8 g/dL (ref 30.0–36.0)
MCV: 97.8 fL (ref 78.0–100.0)
Platelets: 125 10*3/uL — ABNORMAL LOW (ref 150–400)
RBC: 3.24 MIL/uL — ABNORMAL LOW (ref 4.22–5.81)
RDW: 14.2 % (ref 11.5–15.5)
WBC: 4.7 10*3/uL (ref 4.0–10.5)

## 2011-10-15 LAB — PROTIME-INR
INR: 1.36 (ref 0.00–1.49)
Prothrombin Time: 17 seconds — ABNORMAL HIGH (ref 11.6–15.2)

## 2011-10-15 LAB — HEPARIN LEVEL (UNFRACTIONATED): Heparin Unfractionated: 0.54 IU/mL (ref 0.30–0.70)

## 2011-10-15 MED ORDER — WARFARIN SODIUM 4 MG PO TABS
4.0000 mg | ORAL_TABLET | Freq: Once | ORAL | Status: AC
  Administered 2011-10-15: 4 mg via ORAL
  Filled 2011-10-15: qty 1

## 2011-10-15 NOTE — Progress Notes (Signed)
ANTICOAGULATION CONSULT NOTE - Follow Up Consult  Pharmacy Consult for:  Coumadin with Heparin bridging Indication: atrial fibrillation  Noted new events and CT findings positive for head hematoma likely result of prior trauma.  Anticoagulation has been stopped at this time.  Plan:  Will f/u with Neuro and plans to resume anticoagulation.  Cancel scheduled labs for now.  Nadara Mustard, PharmD., MS Clinical Pharmacist Pager:  [phone]  Allergies  Allergen Reactions  . Amiodarone     REACTION: fluid on lungs    Patient Measurements: Height: 5\' 8"  (172.7 cm) Weight: 160 lb 15 oz (73 kg) IBW/kg (Calculated) : 68.4    Vital Signs: Temp: 98.8 F (37.1 C) (03/20 1937) Temp src: Oral (03/20 1937) BP: 113/65 mmHg (03/20 1937) Pulse Rate: 59  (03/20 1937)  Labs:  Alvira Philips 10/15/11 0546 10/14/11 2031 10/14/11 0913 10/14/11 0329 10/14/11 0328 10/13/11 2214 10/13/11 2153 10/13/11 1537  HGB 10.7* -- -- 10.1* -- -- -- --  HCT 31.7* -- -- 29.8* -- -- 33.0* --  PLT 125* -- -- 133* -- -- 141* --  APTT -- -- -- -- -- -- 27 --  LABPROT 17.0* -- -- 15.2 -- -- 15.2 --  INR 1.36 -- -- 1.18 -- -- 1.18 --  HEPARINUNFRC 0.54 0.42 -- -- -- -- -- --  CREATININE -- -- -- 1.35 -- -- 1.37* 1.43*  CKTOTAL -- -- 197 -- 208 236* -- --  CKMB -- -- 3.0 -- 3.1 4.0 -- --  TROPONINI -- -- <0.30 -- <0.30 <0.30 -- --   Estimated Creatinine Clearance: 40.1 ml/min (by C-G formula based on Cr of 1.35).   Medications:  Prescriptions prior to admission  Medication Sig Dispense Refill  . acetaminophen (TYLENOL) 325 MG tablet Take 650 mg by mouth 2 (two) times daily. 2 in AM 2 in afternoon       . B Complex-C (B-COMPLEX WITH VITAMIN C) tablet Take 1 tablet by mouth daily.      . carvedilol (COREG) 3.125 MG tablet Take 2 tablets (6.25 mg total) by mouth 2 (two) times daily with a meal.  122 tablet  3  . Cholecalciferol (VITAMIN D-3) 5000 UNITS TABS Take 1 tablet by mouth daily.        . digoxin (LANOXIN)  0.125 MG tablet TAKE ONE TABLET BY MOUTH EVERY DAY  30 tablet  12  . finasteride (PROSCAR) 5 MG tablet Take 5 mg by mouth daily.        Marland Kitchen lisinopril (PRINIVIL,ZESTRIL) 10 MG tablet TAKE ONE TABLET BY MOUTH EVERY DAY  30 tablet  6  . Multiple Vitamin (MULITIVITAMIN WITH MINERALS) TABS Take 1 tablet by mouth daily.      . Omega-3 Fatty Acids (FISH OIL) 1000 MG CAPS Take 1 capsule by mouth 3 (three) times daily.       . pravastatin (PRAVACHOL) 20 MG tablet Take 20 mg by mouth at bedtime.        Marland Kitchen spironolactone (ALDACTONE) 25 MG tablet TAKE ONE TABLET BY MOUTH EVERY DAY  30 tablet  6  . Tamsulosin HCl (FLOMAX) 0.4 MG CAPS Take 0.4 mg by mouth daily.        . Thiamine HCl (VITAMIN B-1) 100 MG tablet Take 100 mg by mouth daily.        . traMADol (ULTRAM) 50 MG tablet Take 100 mg by mouth 2 (two) times daily.       Marland Kitchen warfarin (COUMADIN) 2 MG tablet Take 2 mg by mouth  daily. 1 tablet on Sunday, Tuesday, Wednesday, Friday, and Saturday 1/2 tablet on Monday and Thursday       Scheduled:     . B-complex with vitamin C  1 tablet Oral Daily  . carvedilol  6.25 mg Oral BID WC  . cholecalciferol  5,000 Units Oral Daily  . finasteride  5 mg Oral Daily  . mulitivitamin with minerals  1 tablet Oral Daily  . omega-3 acid ethyl esters  1 g Oral BID  . simvastatin  20 mg Oral q1800  . Tamsulosin HCl  0.4 mg Oral Daily  . thiamine  100 mg Oral Daily  . warfarin  4 mg Oral ONCE-1800  . DISCONTD: aspirin EC  81 mg Oral Daily  . DISCONTD: warfarin  1 each Does not apply Once  . DISCONTD: Warfarin - Pharmacist Dosing Inpatient   Does not apply q1800   Infusions:     . sodium chloride 20 mL/hr (10/14/11 1500)  . DISCONTD: heparin Stopped (10/15/11 1800)

## 2011-10-15 NOTE — Consult Note (Signed)
[facility] CONSULT NOTE     Reason for Consult: Syncope with traumatic intracranial hemorrhage   HPI:    [name] is an 78 y.o. male a history of atrial fibrillation treated with anticoagulation, sick sinus syndrome with pacemaker placement, renal insufficiency hypertension and hyperlipidemia, who was admitted on 10/13/2011 following a syncopal episode that occurred while he was visiting a friend in a local nursing facility. Patient felt lightheaded prior to losing consciousness. He suffered trauma to the left cheek and periorbital region. No fracture was seen. Repeat CT scan of his head today showed a 1.3 cm hematoma involving the right cerebral peduncle. Blood was also seen in the perimesencephalic cistern. Patient had no complaints of a mild headache. Anticoagulation has been suspended. NIH stroke score 0.  Past Medical History  Diagnosis Date  . CHF (congestive heart failure)   . Atrial fibrillation   . HTN (hypertension)   . Pacemaker     [employer]   . Arthritis     incl spinal stenosis  . Renal insufficiency     question degree    Past Surgical History  Procedure Date  . Bilateral shoulder surgery   . Tonsillectomy   . Prosthetic hip - bilaterally   . R knee quadricep tendon repair     History reviewed. No pertinent family history.  Social History:  reports that he quit smoking about 40 years ago. His smoking use included Cigarettes. He has never used smokeless tobacco. He reports that he drinks alcohol. He reports that he does not use illicit drugs.  Allergies  Allergen Reactions  . Amiodarone     REACTION: fluid on lungs    Medications:    Scheduled:   . B-complex with vitamin C  1 tablet Oral Daily  . carvedilol  6.25 mg Oral BID WC  . cholecalciferol  5,000 Units Oral Daily  . finasteride  5 mg Oral Daily  . mulitivitamin with minerals  1 tablet Oral Daily  . omega-3 acid ethyl esters  1 g Oral BID  .  simvastatin  20 mg Oral q1800  . Tamsulosin HCl  0.4 mg Oral Daily  . thiamine  100 mg Oral Daily  . warfarin  4 mg Oral ONCE-1800  . DISCONTD: aspirin EC  81 mg Oral Daily  . DISCONTD: warfarin  1 each Does not apply Once  . DISCONTD: Warfarin - Pharmacist Dosing Inpatient   Does not apply q1800     Blood pressure 113/65, pulse 59, temperature 98.8 F (37.1 C), temperature source Oral, resp. rate 18, height 5\' 8"  (1.727 m), weight 73 kg (160 lb 15 oz), SpO2 97.00%.   Neurologic Examination:  Mental Status: Alert, oriented, thought content appropriate.  Speech fluent without evidence of aphasia. Able to follow 3 step commands without difficulty. Cranial Nerves: II-Visual fields intact. III/IV/VI-Extraocular movements full and conjugate.  Pupils reacted normally to light. V/VII-no facial numbness and no facial weakness VIII-grossly intact X-normal speech and palatal movement XII-midline tongue extension Motor: 5/5 bilaterally with normal tone and bulk Sensory: light touch intact throughout, bilaterally Deep Tendon Reflexes: 1+ and symmetric throughout Plantars: Downgoing bilaterally Cerebellar: Normal finger-to-nose  Lab Results  Component Value Date/Time   CHOL 152 10/14/2011  3:29 AM    Results for orders placed during the hospital encounter of 10/13/11 (from the past 48 hour(s))  PROTIME-INR  Status: Normal   Collection Time   10/13/11  9:53 PM      Component Value Range Comment   Prothrombin Time 15.2  11.6 - 15.2 (seconds)    INR 1.18  0.00 - 1.49    APTT     Status: Normal   Collection Time   10/13/11  9:53 PM      Component Value Range Comment   aPTT 27  24 - 37 (seconds)   CBC     Status: Abnormal   Collection Time   10/13/11  9:53 PM      Component Value Range Comment   WBC 6.5  4.0 - 10.5 (K/uL)    RBC 3.43 (*) 4.22 - 5.81 (MIL/uL)    Hemoglobin 11.1 (*) 13.0 - 17.0 (g/dL)    HCT 16.1 (*) 09.6 - 52.0 (%)    MCV 96.2  78.0 - 100.0 (fL)    MCH 32.4  26.0  - 34.0 (pg)    MCHC 33.6  30.0 - 36.0 (g/dL)    RDW 04.5  40.9 - 81.1 (%)    Platelets 141 (*) 150 - 400 (K/uL)   DIFFERENTIAL     Status: Abnormal   Collection Time   10/13/11  9:53 PM      Component Value Range Comment   Neutrophils Relative 79 (*) 43 - 77 (%)    Neutro Abs 5.1  1.7 - 7.7 (K/uL)    Lymphocytes Relative 15  12 - 46 (%)    Lymphs Abs 1.0  0.7 - 4.0 (K/uL)    Monocytes Relative 6  3 - 12 (%)    Monocytes Absolute 0.4  0.1 - 1.0 (K/uL)    Eosinophils Relative 1  0 - 5 (%)    Eosinophils Absolute 0.1  0.0 - 0.7 (K/uL)    Basophils Relative 0  0 - 1 (%)    Basophils Absolute 0.0  0.0 - 0.1 (K/uL)   TSH     Status: Normal   Collection Time   10/13/11  9:53 PM      Component Value Range Comment   TSH 2.449  0.350 - 4.500 (uIU/mL)   COMPREHENSIVE METABOLIC PANEL     Status: Abnormal   Collection Time   10/13/11  9:53 PM      Component Value Range Comment   Sodium 141  135 - 145 (mEq/L)    Potassium 4.0  3.5 - 5.1 (mEq/L)    Chloride 106  96 - 112 (mEq/L)    CO2 26  19 - 32 (mEq/L)    Glucose, Bld 163 (*) 70 - 99 (mg/dL)    BUN 25 (*) 6 - 23 (mg/dL)    Creatinine, Ser 9.14 (*) 0.50 - 1.35 (mg/dL)    Calcium 9.2  8.4 - 10.5 (mg/dL)    Total Protein 6.5  6.0 - 8.3 (g/dL)    Albumin 3.3 (*) 3.5 - 5.2 (g/dL)    AST 20  0 - 37 (U/L)    ALT 13  0 - 53 (U/L)    Alkaline Phosphatase 57  39 - 117 (U/L)    Total Bilirubin 0.4  0.3 - 1.2 (mg/dL)    GFR calc non Af Amer 46 (*) >90 (mL/min)    GFR calc Af Amer 53 (*) >90 (mL/min)   MAGNESIUM     Status: Normal   Collection Time   10/13/11  9:53 PM      Component Value Range Comment   Magnesium 2.1  1.5 - 2.5 (mg/dL)   CARDIAC PANEL(CRET KIN+CKTOT+MB+TROPI)     Status: Abnormal   Collection Time   10/13/11 10:14 PM      Component Value Range Comment   Total CK 236 (*) 7 - 232 (U/L)    CK, MB 4.0  0.3 - 4.0 (ng/mL)    Troponin I <0.30  <0.30 (ng/mL)    Relative Index 1.7  0.0 - 2.5    CARDIAC PANEL(CRET  KIN+CKTOT+MB+TROPI)     Status: Normal   Collection Time   10/14/11  3:28 AM      Component Value Range Comment   Total CK 208  7 - 232 (U/L)    CK, MB 3.1  0.3 - 4.0 (ng/mL)    Troponin I <0.30  <0.30 (ng/mL)    Relative Index 1.5  0.0 - 2.5    CBC     Status: Abnormal   Collection Time   10/14/11  3:29 AM      Component Value Range Comment   WBC 5.6  4.0 - 10.5 (K/uL)    RBC 3.13 (*) 4.22 - 5.81 (MIL/uL)    Hemoglobin 10.1 (*) 13.0 - 17.0 (g/dL)    HCT 09.8 (*) 11.9 - 52.0 (%)    MCV 95.2  78.0 - 100.0 (fL)    MCH 32.3  26.0 - 34.0 (pg)    MCHC 33.9  30.0 - 36.0 (g/dL)    RDW 14.7  82.9 - 56.2 (%)    Platelets 133 (*) 150 - 400 (K/uL)   BASIC METABOLIC PANEL     Status: Abnormal   Collection Time   10/14/11  3:29 AM      Component Value Range Comment   Sodium 139  135 - 145 (mEq/L)    Potassium 4.0  3.5 - 5.1 (mEq/L)    Chloride 106  96 - 112 (mEq/L)    CO2 24  19 - 32 (mEq/L)    Glucose, Bld 95  70 - 99 (mg/dL)    BUN 26 (*) 6 - 23 (mg/dL)    Creatinine, Ser 1.30  0.50 - 1.35 (mg/dL)    Calcium 8.7  8.4 - 10.5 (mg/dL)    GFR calc non Af Amer 47 (*) >90 (mL/min)    GFR calc Af Amer 54 (*) >90 (mL/min)   PROTIME-INR     Status: Normal   Collection Time   10/14/11  3:29 AM      Component Value Range Comment   Prothrombin Time 15.2  11.6 - 15.2 (seconds)    INR 1.18  0.00 - 1.49    LIPID PANEL     Status: Normal   Collection Time   10/14/11  3:29 AM      Component Value Range Comment   Cholesterol 152  0 - 200 (mg/dL)    Triglycerides 98  <865 (mg/dL)    HDL 54  >78 (mg/dL)    Total CHOL/HDL Ratio 2.8      VLDL 20  0 - 40 (mg/dL)    LDL Cholesterol 78  0 - 99 (mg/dL)   CARDIAC PANEL(CRET KIN+CKTOT+MB+TROPI)     Status: Normal   Collection Time   10/14/11  9:13 AM      Component Value Range Comment   Total CK 197  7 - 232 (U/L)    CK, MB 3.0  0.3 - 4.0 (ng/mL)    Troponin I <0.30  <0.30 (ng/mL)    Relative Index 1.5  0.0 - 2.5  HEPARIN LEVEL (UNFRACTIONATED)      Status: Normal   Collection Time   10/14/11  8:31 PM      Component Value Range Comment   Heparin Unfractionated 0.42  0.30 - 0.70 (IU/mL)   PROTIME-INR     Status: Abnormal   Collection Time   10/15/11  5:46 AM      Component Value Range Comment   Prothrombin Time 17.0 (*) 11.6 - 15.2 (seconds)    INR 1.36  0.00 - 1.49    CBC     Status: Abnormal   Collection Time   10/15/11  5:46 AM      Component Value Range Comment   WBC 4.7  4.0 - 10.5 (K/uL)    RBC 3.24 (*) 4.22 - 5.81 (MIL/uL)    Hemoglobin 10.7 (*) 13.0 - 17.0 (g/dL)    HCT 81.1 (*) 91.4 - 52.0 (%)    MCV 97.8  78.0 - 100.0 (fL)    MCH 33.0  26.0 - 34.0 (pg)    MCHC 33.8  30.0 - 36.0 (g/dL)    RDW 78.2  95.6 - 21.3 (%)    Platelets 125 (*) 150 - 400 (K/uL)   HEPARIN LEVEL (UNFRACTIONATED)     Status: Normal   Collection Time   10/15/11  5:46 AM      Component Value Range Comment   Heparin Unfractionated 0.54  0.30 - 0.70 (IU/mL)     Ct Head Wo Contrast  10/15/2011  *RADIOLOGY REPORT*  Clinical Data: Recent head trauma.  New onset of left sided numbness.  CT HEAD WITHOUT CONTRAST  Technique:  Contiguous axial images were obtained from the base of the skull through the vertex without contrast.  Comparison: 10/13/2011  Findings: There is now acute hemorrhage within the right cerebral peduncle measuring 13 mm in diameter.  The previous study showed what looked like an old infarction in that region.  There is also acute hemorrhage in the perimesencephalic cistern more to the left. This distribution and usually due to venous bleeding.  Elsewhere, the brain shows atrophy and mild chronic small vessel change in the deep white matter.  There is swelling of the left scalp.  No skull fracture.  No hydrocephalus.  No subdural collection.  No definable mass lesion.  IMPRESSION: Development of a 1.3 cm hematoma in the right cerebral peduncle and acute blood in the peri masses cephalic cistern more on the left. I suspect these hemorrhages are  sequelae of the trauma 2 days ago, though hemorrhage was not detectable in any way at that time.  Critical Value/emergent results were called by telephone at the time of interpretation on 10/15/2011  at 1805 hours  to  the patient's nurse Shawna Orleans( who is contacting the managing physician), who verbally acknowledged these results.  Original Report Authenticated By: Thomasenia Sales, M.D.     Assessment/Plan:  Acute traumatic intracranial hemorrhage involving the right cerebral peduncle and perimesencephalic cistern. Patient clinically has no focal neurological deficits.  Recommendations: Agree with holding anticoagulation at this point. Recommend repeat CT scan of the head in 5-7 days for comparison with current study to ascertain beginning resolution of intracerebral and cisternal hemorrhage. Restart anticoagulation with hemorrhage has resolved.  Venetia Maxon M.D. [facility] [phone]  10/15/2011, 9:16 PM

## 2011-10-15 NOTE — Progress Notes (Signed)
Dr Karin Golden called and informed me of pts CT findings.. Dr Sharyn Lull made aware. Ordered to stop heparin and coumadin. He is also going to call a Neurologist to see patient.

## 2011-10-15 NOTE — Progress Notes (Signed)
UR completed. Fabio Wah RN BSN  

## 2011-10-15 NOTE — Progress Notes (Signed)
ANTICOAGULATION CONSULT NOTE - Follow Up Consult  Pharmacy Consult for:  Coumadin with Heparin bridging Indication: atrial fibrillation  Allergies  Allergen Reactions  . Amiodarone     REACTION: fluid on lungs    Patient Measurements: Height: 5\' 8"  (172.7 cm) Weight: 160 lb 15 oz (73 kg) IBW/kg (Calculated) : 68.4    Vital Signs: Temp: 97.7 F (36.5 C) (03/20 0500) Temp src: Oral (03/20 0500) BP: 143/72 mmHg (03/20 0500) Pulse Rate: 61  (03/20 0500)  Labs:  Alvira Philips 10/15/11 0546 10/14/11 2031 10/14/11 0913 10/14/11 0329 10/14/11 0328 10/13/11 2214 10/13/11 2153 10/13/11 1537  HGB 10.7* -- -- 10.1* -- -- -- --  HCT 31.7* -- -- 29.8* -- -- 33.0* --  PLT 125* -- -- 133* -- -- 141* --  APTT -- -- -- -- -- -- 27 --  LABPROT 17.0* -- -- 15.2 -- -- 15.2 --  INR 1.36 -- -- 1.18 -- -- 1.18 --  HEPARINUNFRC 0.54 0.42 -- -- -- -- -- --  CREATININE -- -- -- 1.35 -- -- 1.37* 1.43*  CKTOTAL -- -- 197 -- 208 236* -- --  CKMB -- -- 3.0 -- 3.1 4.0 -- --  TROPONINI -- -- <0.30 -- <0.30 <0.30 -- --   Estimated Creatinine Clearance: 40.1 ml/min (by C-G formula based on Cr of 1.35).   Medications:  Prescriptions prior to admission  Medication Sig Dispense Refill  . acetaminophen (TYLENOL) 325 MG tablet Take 650 mg by mouth 2 (two) times daily. 2 in AM 2 in afternoon       . B Complex-C (B-COMPLEX WITH VITAMIN C) tablet Take 1 tablet by mouth daily.      . carvedilol (COREG) 3.125 MG tablet Take 2 tablets (6.25 mg total) by mouth 2 (two) times daily with a meal.  122 tablet  3  . Cholecalciferol (VITAMIN D-3) 5000 UNITS TABS Take 1 tablet by mouth daily.        . digoxin (LANOXIN) 0.125 MG tablet TAKE ONE TABLET BY MOUTH EVERY DAY  30 tablet  12  . finasteride (PROSCAR) 5 MG tablet Take 5 mg by mouth daily.        Marland Kitchen lisinopril (PRINIVIL,ZESTRIL) 10 MG tablet TAKE ONE TABLET BY MOUTH EVERY DAY  30 tablet  6  . Multiple Vitamin (MULITIVITAMIN WITH MINERALS) TABS Take 1 tablet by mouth  daily.      . Omega-3 Fatty Acids (FISH OIL) 1000 MG CAPS Take 1 capsule by mouth 3 (three) times daily.       . pravastatin (PRAVACHOL) 20 MG tablet Take 20 mg by mouth at bedtime.        Marland Kitchen spironolactone (ALDACTONE) 25 MG tablet TAKE ONE TABLET BY MOUTH EVERY DAY  30 tablet  6  . Tamsulosin HCl (FLOMAX) 0.4 MG CAPS Take 0.4 mg by mouth daily.        . Thiamine HCl (VITAMIN B-1) 100 MG tablet Take 100 mg by mouth daily.        . traMADol (ULTRAM) 50 MG tablet Take 100 mg by mouth 2 (two) times daily.       Marland Kitchen warfarin (COUMADIN) 2 MG tablet Take 2 mg by mouth daily. 1 tablet on Sunday, Tuesday, Wednesday, Friday, and Saturday 1/2 tablet on Monday and Thursday       Scheduled:    . aspirin EC  81 mg Oral Daily  . B-complex with vitamin C  1 tablet Oral Daily  . carvedilol  6.25 mg Oral  BID WC  . cholecalciferol  5,000 Units Oral Daily  . coumadin book  1 each Does not apply Once  . finasteride  5 mg Oral Daily  . heparin  3,000 Units Intravenous Once  . mulitivitamin with minerals  1 tablet Oral Daily  . omega-3 acid ethyl esters  1 g Oral BID  . simvastatin  20 mg Oral q1800  . Tamsulosin HCl  0.4 mg Oral Daily  . thiamine  100 mg Oral Daily  . warfarin  4 mg Oral ONCE-1800  . warfarin  1 each Does not apply Once  . Warfarin - Pharmacist Dosing Inpatient   Does not apply q1800   Infusions:    . sodium chloride 20 mL/hr (10/14/11 1500)  . heparin 1,000 Units/hr (10/15/11 1119)    Assessment:  78 yr old male admitted after syncopal episode while visiting a friend at a nursing home. Pt has a history of a.fib and was on coumadin PTA.   Heparin within therapeutic range.  INR  SUBtherapeutic 1.36  Goal of Therapy:   INR 2-3  Heparin level 0.3-0.7 units/ml   Plan:   Continue Heparin at 1000 units/hr.  Repeat Coumadin 4 mg x 1.  Ladavion Savitz, Elisha Headland, Pharm.D. 10/15/2011 12:35 PM

## 2011-10-15 NOTE — Progress Notes (Signed)
C/o left side facial ,left fingers , and left lower ext numbness. Pt alert and oriented follwing commds. Speech clear. Grips equal bilaterally. Intact sensation.moved all extremeties equally. No drift. Normal smile. There is also a small hematoma tol left cheek which was shown to dr Sharyn Lull earlier today. Dr Sharyn Lull made aware of assesmnts. Ct head orrdered.

## 2011-10-15 NOTE — Progress Notes (Signed)
Subjective:  Patient denies any chest pain or shortness of breath No further episodes of syncope or dizziness. Complaints of left-sided facial swelling and ecchymosis secondary to fall Which is stable Objective:  Vital Signs in the last 24 hours: Temp:  [97.7 F (36.5 C)-98.9 F (37.2 C)] 97.7 F (36.5 C) (03/20 0500) Pulse Rate:  [60-73] 61  (03/20 0500) Resp:  [18-22] 18  (03/20 0500) BP: (95-143)/(51-72) 143/72 mmHg (03/20 0500) SpO2:  [95 %-99 %] 95 % (03/20 0500)  Intake/Output from previous day: 03/19 0701 - 03/20 0700 In: 1103.3 [P.O.:240; I.V.:863.3] Out: 3600 [Urine:3600] Intake/Output from this shift: Total I/O In: 100 [P.O.:100] Out: 800 [Urine:800]  Physical Exam: Neck: no adenopathy, no carotid bruit, no JVD and supple, symmetrical, trachea midline Lungs: clear to auscultation bilaterally Heart: irregularly irregular rhythm, S1, S2 normal and Soft systolic murmur noted Abdomen: soft, non-tender; bowel sounds normal; no masses,  no organomegaly Extremities: extremities normal, atraumatic, no cyanosis or edema  Lab Results:  Basename 10/15/11 0546 10/14/11 0329  WBC 4.7 5.6  HGB 10.7* 10.1*  PLT 125* 133*    Basename 10/14/11 0329 10/13/11 2153  NA 139 141  K 4.0 4.0  CL 106 106  CO2 24 26  GLUCOSE 95 163*  BUN 26* 25*  CREATININE 1.35 1.37*    Basename 10/14/11 0913 10/14/11 0328  TROPONINI <0.30 <0.30   Hepatic Function Panel  Basename 10/13/11 2153  PROT 6.5  ALBUMIN 3.3*  AST 20  ALT 13  ALKPHOS 57  BILITOT 0.4  BILIDIR --  IBILI --    Basename 10/14/11 0329  CHOL 152   No results found for this basename: PROTIME in the last 72 hours  Imaging: Imaging results have been reviewed and Ct Head Wo Contrast (if Head Trauma)  10/13/2011  **ADDENDUM** CREATED: 10/13/2011 16:47:32  9 mm sclerotic focus within the left aspect of the C7 vertebral body may be a bone island.  Small sclerotic metastatic lesion not excluded in the proper clinical  setting.  Carotid bifurcation calcifications.  **END ADDENDUM** SIGNED BY: Almedia Balls. Constance Goltz, M.D.    10/13/2011  *RADIOLOGY REPORT*  Clinical Data:  Loss of consciousness.  CT HEAD WITHOUT CONTRAST CT CERVICAL SPINE WITHOUT CONTRAST  Technique:  Multidetector CT imaging of the head and cervical spine was performed following the standard protocol without intravenous contrast.  Multiplanar CT image reconstructions of the cervical spine were also generated.  Comparison:   None  CT HEAD  Findings: Soft tissue swelling left lateral orbital region.  No underlying fracture or intracranial hemorrhage.  Atrophy without hydrocephalus.  No CT evidence of large acute infarct.  No intracranial mass lesion detected on this unenhanced exam.  IMPRESSION: No skull fracture or intracranial hemorrhage.  Soft tissue swelling lateral left orbital region.  CT CERVICAL SPINE  Findings: Rotation with prominent degenerative changes slightly limit evaluation.  No fracture of the cervical spine noted.  No abnormal prevertebral soft tissue swelling.  Prominent transverse ligament hypertrophy.  IMPRESSION: No cervical spine fracture noted.  Evaluation slightly limited by rotation and prominent degenerative changes.  Original Report Authenticated By: Fuller Canada, M.D.   Ct Cervical Spine Wo Contrast  10/13/2011  **ADDENDUM** CREATED: 10/13/2011 16:47:32  9 mm sclerotic focus within the left aspect of the C7 vertebral body may be a bone island.  Small sclerotic metastatic lesion not excluded in the proper clinical setting.  Carotid bifurcation calcifications.  **END ADDENDUM** SIGNED BY: Almedia Balls. Constance Goltz, M.D.    10/13/2011  *  RADIOLOGY REPORT*  Clinical Data:  Loss of consciousness.  CT HEAD WITHOUT CONTRAST CT CERVICAL SPINE WITHOUT CONTRAST  Technique:  Multidetector CT imaging of the head and cervical spine was performed following the standard protocol without intravenous contrast.  Multiplanar CT image reconstructions of the cervical  spine were also generated.  Comparison:   None  CT HEAD  Findings: Soft tissue swelling left lateral orbital region.  No underlying fracture or intracranial hemorrhage.  Atrophy without hydrocephalus.  No CT evidence of large acute infarct.  No intracranial mass lesion detected on this unenhanced exam.  IMPRESSION: No skull fracture or intracranial hemorrhage.  Soft tissue swelling lateral left orbital region.  CT CERVICAL SPINE  Findings: Rotation with prominent degenerative changes slightly limit evaluation.  No fracture of the cervical spine noted.  No abnormal prevertebral soft tissue swelling.  Prominent transverse ligament hypertrophy.  IMPRESSION: No cervical spine fracture noted.  Evaluation slightly limited by rotation and prominent degenerative changes.  Original Report Authenticated By: Fuller Canada, M.D.   Dg Chest Port 1 View  10/13/2011  *RADIOLOGY REPORT*  Clinical Data: Syncopal episode  PORTABLE CHEST - 1 VIEW  Comparison: None.  Findings: Dual lead pacemaker is in place from a left subclavian approach.  Leads are somewhat unusually positioned, possibly in the region of the right ventricular outflow tract.  The heart is mildly enlarged.  There is atherosclerosis of the aorta.  No evidence of heart failure or effusion.  There is mild pulmonary scarring in the left upper lobe.  IMPRESSION: No active cardiopulmonary disease evident.  Mild cardiomegaly. Atherosclerosis.  Dual lead pacemaker with leads projected over the right ventricular outflow tract region.  Original Report Authenticated By: Thomasenia Sales, M.D.    Cardiac Studies:  Assessment/Plan:  Status post syncope workup negative Nonischemic myopathy Atrial fibrillation Hypertension Hypercholesteremia Chronic kidney disease stage III Anemia of chronic disease Plan Continue present management Will DC home once INR above 2  LOS: 2 days    [name] N 10/15/2011, 12:20 PM

## 2011-10-16 MED ORDER — DOCUSATE SODIUM 100 MG PO CAPS
100.0000 mg | ORAL_CAPSULE | Freq: Two times a day (BID) | ORAL | Status: DC | PRN
  Administered 2011-10-16: 100 mg via ORAL
  Filled 2011-10-16: qty 1

## 2011-10-16 NOTE — Progress Notes (Signed)
Subjective:  Patient complains of some numbness in the left arm and leg denies any weakness. Complains of constipation  Objective:  Vital Signs in the last 24 hours: Temp:  [97 F (36.1 C)-98.8 F (37.1 C)] 97 F (36.1 C) (03/21 0509) Pulse Rate:  [59-72] 72  (03/21 0512) Resp:  [18] 18  (03/21 0509) BP: (110-139)/(57-70) 121/66 mmHg (03/21 0512) SpO2:  [97 %-100 %] 100 % (03/21 0509)  Intake/Output from previous day: 03/20 0701 - 03/21 0700 In: 1870 [P.O.:1160; I.V.:710] Out: 2950 [Urine:2950] Intake/Output from this shift: Total I/O In: -  Out: 750 [Urine:750]  Physical Exam: Neck: no adenopathy, no carotid bruit, no JVD and supple, symmetrical, trachea midline Lungs: clear to auscultation bilaterally Heart: regular rate and rhythm, S1, S2 normal and Soft systolic murmur noted Abdomen: soft, non-tender; bowel sounds normal; no masses,  no organomegaly Extremities: extremities normal, atraumatic, no cyanosis or edema  Lab Results:  Basename 10/15/11 0546 10/14/11 0329  WBC 4.7 5.6  HGB 10.7* 10.1*  PLT 125* 133*    Basename 10/14/11 0329 10/13/11 2153  NA 139 141  K 4.0 4.0  CL 106 106  CO2 24 26  GLUCOSE 95 163*  BUN 26* 25*  CREATININE 1.35 1.37*    Basename 10/14/11 0913 10/14/11 0328  TROPONINI <0.30 <0.30   Hepatic Function Panel  Basename 10/13/11 2153  PROT 6.5  ALBUMIN 3.3*  AST 20  ALT 13  ALKPHOS 57  BILITOT 0.4  BILIDIR --  IBILI --    Basename 10/14/11 0329  CHOL 152   No results found for this basename: PROTIME in the last 72 hours  Imaging: Imaging results have been reviewed and Ct Head Wo Contrast  10/15/2011  *RADIOLOGY REPORT*  Clinical Data: Recent head trauma.  New onset of left sided numbness.  CT HEAD WITHOUT CONTRAST  Technique:  Contiguous axial images were obtained from the base of the skull through the vertex without contrast.  Comparison: 10/13/2011  Findings: There is now acute hemorrhage within the right cerebral  peduncle measuring 13 mm in diameter.  The previous study showed what looked like an old infarction in that region.  There is also acute hemorrhage in the perimesencephalic cistern more to the left. This distribution and usually due to venous bleeding.  Elsewhere, the brain shows atrophy and mild chronic small vessel change in the deep white matter.  There is swelling of the left scalp.  No skull fracture.  No hydrocephalus.  No subdural collection.  No definable mass lesion.  IMPRESSION: Development of a 1.3 cm hematoma in the right cerebral peduncle and acute blood in the peri masses cephalic cistern more on the left. I suspect these hemorrhages are sequelae of the trauma 2 days ago, though hemorrhage was not detectable in any way at that time.  Critical Value/emergent results were called by telephone at the time of interpretation on 10/15/2011  at 1805 hours  to  the patient's nurse Shawna Orleans( who is contacting the managing physician), who verbally acknowledged these results.  Original Report Authenticated By: Thomasenia Sales, M.D.    Cardiac Studies:  Assessment/Plan:  Status post right cerebral peduncle and mesencephalic cistern hemorrhage Status post syncope/fall Chronic atrial fibrillation Nonischemic cardiomyopathy Hypertension Hypercholesteremia Chronic kidney disease stage III Anemia of chronic disease Left facial ecchymosis her secondary to fall Plan Continue present management OT PT consult Discussed with patient regarding high risk of rebleed and high risk of fall and also his age we will not start back  on any anticoagulations with slightly higher risk of stroke in view of A. fib patient understands and agrees. We'll repeat CT of the brain in few days  LOS: 3 days    Mimi Debellis N 10/16/2011, 10:59 AM

## 2011-10-16 NOTE — Progress Notes (Signed)
Pt ambulated 500 ft with RW and 1 assist.  Gait unsteady.  No complaints of pain, fatigue, SOB, or dizziness.  Returned to bed, stated "I feel better already."  Call bell in reach, will con't plan of care.

## 2011-10-16 NOTE — Progress Notes (Addendum)
[facility] PROGRESS NOTE    SUBJECTIVE   His double vision has resolved and feels better overall. Continues to have left facial tingling, left hand tingling and left leg decreased sensation but these symptoms have not changed since admission. No further complaints.   OBJECTIVE   Vital signs in last 24 hours: Temp:  [97 F (36.1 C)-99.8 F (37.7 C)] 99.8 F (37.7 C) (03/21 1347) Pulse Rate:  [54-72] 54  (03/21 1347) Resp:  [18] 18  (03/21 1347) BP: (110-139)/(57-70) 113/59 mmHg (03/21 1347) SpO2:  [95 %-100 %] 95 % (03/21 1347)  Intake/Output from previous day: 03/20 0701 - 03/21 0700 In: 1870 [P.O.:1160; I.V.:710] Out: 2950 [Urine:2950] Intake/Output this shift: Total I/O In: -  Out: 1250 [Urine:1250] Nutritional status: Cardiac  Past Medical History  Diagnosis Date  . CHF (congestive heart failure)   . Atrial fibrillation   . HTN (hypertension)   . Pacemaker     [employer]   . Arthritis     incl spinal stenosis  . Renal insufficiency     question degree    Neurologic Exam:   Mental Status:  Alert, oriented, thought content appropriate. Speech fluent without evidence of aphasia. Able to follow 3 step commands without difficulty.  Cranial Nerves:  II-Visual fields intact.  III/IV/VI-Extraocular movements full and conjugate. Pupils reacted normally to light.  V/VII-left facial decreased sensation but inchanged and no facial weakness  VIII-grossly intact  X-normal speech and palatal movement  XII-midline tongue extension  Motor: 5/5 bilaterally with normal tone and bulk except with shoulder FF which is slightly weaker due to multiple shoulder issues. No drift Sensory: light touch intact throughout, bilaterally but states he continues to have decreased sensation over lefthand and slightly decreased over left foot.  Deep Tendon Reflexes: 1+ and symmetric throughout  Plantars: Downgoing bilaterally    Cerebellar: Normal finger-to-nose  Lab Results: Lab Results  Component Value Date/Time   CHOL 152 10/14/2011  3:29 AM   Lipid Panel  Basename 10/14/11 0329  CHOL 152  TRIG 98  HDL 54  CHOLHDL 2.8  VLDL 20  LDLCALC 78    Studies/Results: Ct Head Wo Contrast  10/15/2011  *RADIOLOGY REPORT*  Clinical Data: Recent head trauma.  New onset of left sided numbness.  CT HEAD WITHOUT CONTRAST  Technique:  Contiguous axial images were obtained from the base of the skull through the vertex without contrast.  Comparison: 10/13/2011  Findings: There is now acute hemorrhage within the right cerebral peduncle measuring 13 mm in diameter.  The previous study showed what looked like an old infarction in that region.  There is also acute hemorrhage in the perimesencephalic cistern more to the left. This distribution and usually due to venous bleeding.  Elsewhere, the brain shows atrophy and mild chronic small vessel change in the deep white matter.  There is swelling of the left scalp.  No skull fracture.  No hydrocephalus.  No subdural collection.  No definable mass lesion.  IMPRESSION: Development of a 1.3 cm hematoma in the right cerebral peduncle and acute blood in the peri masses cephalic cistern more on the left. I suspect these hemorrhages are sequelae of the trauma 2 days ago, though hemorrhage was not detectable in any way at that time.  Critical Value/emergent  results were called by telephone at the time of interpretation on 10/15/2011  at 1805 hours  to  the patient's nurse Shawna Orleans( who is contacting the managing physician), who verbally acknowledged these results.  Original Report Authenticated By: Thomasenia Sales, M.D.    Medications:     Prior to Admission:  Prescriptions prior to admission  Medication Sig Dispense Refill  . acetaminophen (TYLENOL) 325 MG tablet Take 650 mg by mouth 2 (two) times daily. 2 in AM 2 in afternoon       . B Complex-C (B-COMPLEX WITH VITAMIN C) tablet Take 1  tablet by mouth daily.      . carvedilol (COREG) 3.125 MG tablet Take 2 tablets (6.25 mg total) by mouth 2 (two) times daily with a meal.  122 tablet  3  . Cholecalciferol (VITAMIN D-3) 5000 UNITS TABS Take 1 tablet by mouth daily.        . digoxin (LANOXIN) 0.125 MG tablet TAKE ONE TABLET BY MOUTH EVERY DAY  30 tablet  12  . finasteride (PROSCAR) 5 MG tablet Take 5 mg by mouth daily.        Marland Kitchen lisinopril (PRINIVIL,ZESTRIL) 10 MG tablet TAKE ONE TABLET BY MOUTH EVERY DAY  30 tablet  6  . Multiple Vitamin (MULITIVITAMIN WITH MINERALS) TABS Take 1 tablet by mouth daily.      . Omega-3 Fatty Acids (FISH OIL) 1000 MG CAPS Take 1 capsule by mouth 3 (three) times daily.       . pravastatin (PRAVACHOL) 20 MG tablet Take 20 mg by mouth at bedtime.        Marland Kitchen spironolactone (ALDACTONE) 25 MG tablet TAKE ONE TABLET BY MOUTH EVERY DAY  30 tablet  6  . Tamsulosin HCl (FLOMAX) 0.4 MG CAPS Take 0.4 mg by mouth daily.        . Thiamine HCl (VITAMIN B-1) 100 MG tablet Take 100 mg by mouth daily.        . traMADol (ULTRAM) 50 MG tablet Take 100 mg by mouth 2 (two) times daily.       Marland Kitchen warfarin (COUMADIN) 2 MG tablet Take 2 mg by mouth daily. 1 tablet on Sunday, Tuesday, Wednesday, Friday, and Saturday 1/2 tablet on Monday and Thursday       Scheduled:   . B-complex with vitamin C  1 tablet Oral Daily  . carvedilol  6.25 mg Oral BID WC  . cholecalciferol  5,000 Units Oral Daily  . finasteride  5 mg Oral Daily  . mulitivitamin with minerals  1 tablet Oral Daily  . omega-3 acid ethyl esters  1 g Oral BID  . simvastatin  20 mg Oral q1800  . Tamsulosin HCl  0.4 mg Oral Daily  . thiamine  100 mg Oral Daily  . warfarin  4 mg Oral ONCE-1800  . DISCONTD: aspirin EC  81 mg Oral Daily  . DISCONTD: warfarin  1 each Does not apply Once  . DISCONTD: Warfarin - Pharmacist Dosing Inpatient   Does not apply q1800   Carotid Doppler: Summary: Right - 40% to 59% upper range ICA stenosis.  Left - 60% to 79% lower range  ICA stenosis. Bilateral - Vertebral artery is patent and flow is Antegrade.  2 D echo:  - Left ventricle: The cavity size was normal. Systolic function was mildly reduced. The estimated ejection fraction was in the range of 45% to 50%. Diffuse hypokinesis.    Assessment/Plan:   Acute traumatic intracranial hemorrhage involving the right cerebral  peduncle and perimesencephalic cistern. No new symptoms.   Per Dr. Annitta Jersey note patient will not start back on any anticoagulations due to fall risk.  Repeat CT head in AM/PT/OT.    Felicie Morn [name] [facility] [phone]  10/16/2011, 4:17 PM

## 2011-10-17 ENCOUNTER — Inpatient Hospital Stay (HOSPITAL_COMMUNITY): Payer: Medicare Other

## 2011-10-17 NOTE — Progress Notes (Signed)
Subjective:  Patient complains of left facial numbness and numbness in the arm and left leg no significant change in neuro status CT of the brain shows slightly more pronounced hematoma Objective:  Vital Signs in the last 24 hours: Temp:  [98.2 F (36.8 C)-98.4 F (36.9 C)] 98.4 F (36.9 C) (03/22 1400) Pulse Rate:  [60-71] 71  (03/22 1402) Resp:  [18] 18  (03/22 1400) BP: (119-162)/(61-70) 128/64 mmHg (03/22 1402) SpO2:  [97 %-98 %] 97 % (03/22 1400)  Intake/Output from previous day: 03/21 0701 - 03/22 0700 In: 470 [P.O.:470] Out: 2800 [Urine:2800] Intake/Output from this shift:    Physical Exam: Exam essentially unchanged  Lab Results:  Basename 10/15/11 0546  WBC 4.7  HGB 10.7*  PLT 125*   No results found for this basename: NA:2,K:2,CL:2,CO2:2,GLUCOSE:2,BUN:2,CREATININE:2 in the last 72 hours No results found for this basename: TROPONINI:2,CK,MB:2 in the last 72 hours Hepatic Function Panel No results found for this basename: PROT,ALBUMIN,AST,ALT,ALKPHOS,BILITOT,BILIDIR,IBILI in the last 72 hours No results found for this basename: CHOL in the last 72 hours No results found for this basename: PROTIME in the last 72 hours  Imaging: Imaging results have been reviewed and Ct Head Wo Contrast  10/17/2011  *RADIOLOGY REPORT*  Clinical Data: Followup intracranial hemorrhage.  Left facial tingling, left hand tingling and decreased sensation left leg.  CT HEAD WITHOUT CONTRAST  Technique:  Contiguous axial images were obtained from the base of the skull through the vertex without contrast.  Comparison: 10/15/2011.  Findings: Right cerebral peduncle/midbrain hematoma measures 14.2 x 12.5 mm versus prior 13.8 x 12 mm.  Left peri mesencephalic subarachnoid/subdural blood noted.  This may be related to venous injury from recent fall.  Injury to the left posterior cerebral artery branch vessel not entirely a excluded.  Small amount of intraventricular blood dependent aspect of the  lateral ventricles minimally more notable than on prior exam.  Infarct right thalamus appears remote.  Small vessel disease type changes without CT evidence of large acute thrombotic infarct.  No intracranial mass lesion noted as separate from the above described findings.  Global atrophy.  Ventricular prominence probably related to atrophy and without change.  IMPRESSION: Right cerebral peduncle/midbrain hematoma appears minimally more prominent.  Relatively similar appearance of subarachnoid/subdural hemorrhage left posterior perimesencephalic cistern.  Minimal increase in dependent interventricular blood.  Original Report Authenticated By: Fuller Canada, M.D.    Cardiac Studies:  Assessment/Plan:  Right cerebral peduncle/midbrain hematoma status post fall Subarachnoid/subdural hemorrhage left peri mesencephalic cistern Status post fall syncope Chronic atrial fibrillation Nonischemic myopathy Hypertension Hypercholesteremia Chronic kidney disease stage III Anemia of chronic disease Plan Continue present conservative management Will repeat CT in few days as per neuro recommendation Will DC home once cleared by neurology  LOS: 4 days    Danay Mckellar N 10/17/2011, 7:16 PM

## 2011-10-17 NOTE — Progress Notes (Signed)
[facility] PROGRESS NOTE    SUBJECTIVE   He notes some double vision when looking into the hall way from his bed.  This has not changed since yesterday but he states it comes and goes.  Most pronounced when looking in right far gaze. Not present when looking straight or to the left.   OBJECTIVE   Vital signs in last 24 hours: Temp:  [98.2 F (36.8 C)-99.8 F (37.7 C)] 98.2 F (36.8 C) (03/22 0531) Pulse Rate:  [54-63] 62  (03/22 0531) Resp:  [18] 18  (03/22 0531) BP: (113-162)/(59-70) 162/70 mmHg (03/22 0531) SpO2:  [95 %-98 %] 98 % (03/22 0531)  Intake/Output from previous day: 03/21 0701 - 03/22 0700 In: 470 [P.O.:470] Out: 2800 [Urine:2800] Intake/Output this shift:   Nutritional status: Cardiac  Past Medical History  Diagnosis Date  . CHF (congestive heart failure)   . Atrial fibrillation   . HTN (hypertension)   . Pacemaker     [employer]   . Arthritis     incl spinal stenosis  . Renal insufficiency     question degree    Neurologic Exam:  Mental Status:  Alert, oriented, thought content appropriate. Speech fluent without evidence of aphasia. Able to follow 3 step commands without difficulty.  Cranial Nerves:  II-Visual fields intact.  III/IV/VI-Extraocular movements full and conjugate. Pupils reacted normally to light.  V/VII-left facial decreased sensation but unchanged and no facial weakness  VIII-grossly intact  X-normal speech and palatal movement  XII-midline tongue extension  Motor: 5/5 bilaterally with normal tone and bulk except with shoulder FF which is slightly weaker due to multiple shoulder issues. No drift  Sensory: light touch intact throughout, bilaterally but states he continues to have decreased sensation over lefthand and slightly decreased over left foot.  Deep Tendon Reflexes: 1+ and symmetric throughout  Plantars: Downgoing bilaterally  Cerebellar: Normal finger-to-nose and  Heel to shin  Lab Results: Lab Results  Component Value Date/Time   CHOL 152 10/14/2011  3:29 AM   Lipid Panel No results found for this basename: CHOL,TRIG,HDL,CHOLHDL,VLDL,LDLCALC in the last 72 hours  Studies/Results: Ct Head Wo Contrast  10/17/2011  *RADIOLOGY REPORT*  Clinical Data: Followup intracranial hemorrhage.  Left facial tingling, left hand tingling and decreased sensation left leg.  CT HEAD WITHOUT CONTRAST  Technique:  Contiguous axial images were obtained from the base of the skull through the vertex without contrast.  Comparison: 10/15/2011.  Findings: Right cerebral peduncle/midbrain hematoma measures 14.2 x 12.5 mm versus prior 13.8 x 12 mm.  Left peri mesencephalic subarachnoid/subdural blood noted.  This may be related to venous injury from recent fall.  Injury to the left posterior cerebral artery branch vessel not entirely a excluded.  Small amount of intraventricular blood dependent aspect of the lateral ventricles minimally more notable than on prior exam.  Infarct right thalamus appears remote.  Small vessel disease type changes without CT evidence of large acute thrombotic infarct.  No intracranial mass lesion noted as separate from the above described findings.  Global atrophy.  Ventricular prominence probably related to atrophy and without change.  IMPRESSION: Right cerebral peduncle/midbrain hematoma appears minimally more prominent.  Relatively similar appearance of subarachnoid/subdural hemorrhage left posterior perimesencephalic cistern.  Minimal increase in dependent interventricular blood.  Original Report  Authenticated By: Fuller Canada, M.D.   Ct Head Wo Contrast  10/15/2011  *RADIOLOGY REPORT*  Clinical Data: Recent head trauma.  New onset of left sided numbness.  CT HEAD WITHOUT CONTRAST  Technique:  Contiguous axial images were obtained from the base of the skull through the vertex without contrast.  Comparison: 10/13/2011  Findings: There is now acute  hemorrhage within the right cerebral peduncle measuring 13 mm in diameter.  The previous study showed what looked like an old infarction in that region.  There is also acute hemorrhage in the perimesencephalic cistern more to the left. This distribution and usually due to venous bleeding.  Elsewhere, the brain shows atrophy and mild chronic small vessel change in the deep white matter.  There is swelling of the left scalp.  No skull fracture.  No hydrocephalus.  No subdural collection.  No definable mass lesion.  IMPRESSION: Development of a 1.3 cm hematoma in the right cerebral peduncle and acute blood in the peri masses cephalic cistern more on the left. I suspect these hemorrhages are sequelae of the trauma 2 days ago, though hemorrhage was not detectable in any way at that time.  Critical Value/emergent results were called by telephone at the time of interpretation on 10/15/2011  at 1805 hours  to  the patient's nurse Shawna Orleans( who is contacting the managing physician), who verbally acknowledged these results.  Original Report Authenticated By: Thomasenia Sales, M.D.    Medications:     Scheduled:   . B-complex with vitamin C  1 tablet Oral Daily  . carvedilol  6.25 mg Oral BID WC  . cholecalciferol  5,000 Units Oral Daily  . finasteride  5 mg Oral Daily  . mulitivitamin with minerals  1 tablet Oral Daily  . omega-3 acid ethyl esters  1 g Oral BID  . simvastatin  20 mg Oral q1800  . Tamsulosin HCl  0.4 mg Oral Daily  . thiamine  100 mg Oral Daily    Assessment/Plan:    Acute traumatic intracranial hemorrhage involving the right cerebral peduncle and perimesencephalic cistern. CT obtained today shows right cerebral peduncle/midbrain hematoma measures 14.2 x 12.5 mm versus prior 13.8 x 12 mm.  Recommend continue holding anticoagulation.  Repeat head CT in 2-3 days.    Felicie Morn [name] [facility] [phone]  10/17/2011, 11:14 AM

## 2011-10-17 NOTE — Progress Notes (Signed)
Pt ambulated approx 300 feet with rolling walker. No SOB. Some dizziness when pt stood up, resolved within seconds. Pt tolerated walk well.

## 2011-10-18 NOTE — Progress Notes (Signed)
Subjective: Patient denies any chest pain or shortness of breath. States this fascial numbness has improved. Ambulated with PT without any problems. CT of the brain showed slight progression of intracranial hemorrhage clinical the patient is stable  Objective:  Vital Signs in the last 24 hours: Temp:  [98.1 F (36.7 C)-99.5 F (37.5 C)] 98.1 F (36.7 C) (03/23 0518) Pulse Rate:  [60-73] 72  (03/23 0524) Resp:  [18-20] 20  (03/23 0524) BP: (102-146)/(50-68) 132/68 mmHg (03/23 0524) SpO2:  [96 %-99 %] 99 % (03/23 0524)  Intake/Output from previous day: 03/22 0701 - 03/23 0700 In: 840 [P.O.:840] Out: 300 [Urine:300] Intake/Output from this shift: Total I/O In: 240 [P.O.:240] Out: 300 [Urine:300]  Physical Exam: Neck: no adenopathy, no carotid bruit, no JVD and supple, symmetrical, trachea midline Lungs: clear to auscultation bilaterally Heart: regular rate and rhythm, S1, S2 normal and Soft systolic murmur noted Abdomen: soft, non-tender; bowel sounds normal; no masses,  no organomegaly Extremities: extremities normal, atraumatic, no cyanosis or edema and Homans sign is negative, no sign of DVT  Lab Results: No results found for this basename: WBC:2,HGB:2,PLT:2 in the last 72 hours No results found for this basename: NA:2,K:2,CL:2,CO2:2,GLUCOSE:2,BUN:2,CREATININE:2 in the last 72 hours No results found for this basename: TROPONINI:2,CK,MB:2 in the last 72 hours Hepatic Function Panel No results found for this basename: PROT,ALBUMIN,AST,ALT,ALKPHOS,BILITOT,BILIDIR,IBILI in the last 72 hours No results found for this basename: CHOL in the last 72 hours No results found for this basename: PROTIME in the last 72 hours  Imaging: Imaging results have been reviewed and Ct Head Wo Contrast  10/17/2011  *RADIOLOGY REPORT*  Clinical Data: Followup intracranial hemorrhage.  Left facial tingling, left hand tingling and decreased sensation left leg.  CT HEAD WITHOUT CONTRAST  Technique:   Contiguous axial images were obtained from the base of the skull through the vertex without contrast.  Comparison: 10/15/2011.  Findings: Right cerebral peduncle/midbrain hematoma measures 14.2 x 12.5 mm versus prior 13.8 x 12 mm.  Left peri mesencephalic subarachnoid/subdural blood noted.  This may be related to venous injury from recent fall.  Injury to the left posterior cerebral artery branch vessel not entirely a excluded.  Small amount of intraventricular blood dependent aspect of the lateral ventricles minimally more notable than on prior exam.  Infarct right thalamus appears remote.  Small vessel disease type changes without CT evidence of large acute thrombotic infarct.  No intracranial mass lesion noted as separate from the above described findings.  Global atrophy.  Ventricular prominence probably related to atrophy and without change.  IMPRESSION: Right cerebral peduncle/midbrain hematoma appears minimally more prominent.  Relatively similar appearance of subarachnoid/subdural hemorrhage left posterior perimesencephalic cistern.  Minimal increase in dependent interventricular blood.  Original Report Authenticated By: Fuller Canada, M.D.    Cardiac Studies:  Assessment/Plan:  Right cerebral peduncle/midbrain hematoma status post fall  Subarachnoid/subdural hemorrhage left peri mesencephalic cistern  Status post fall syncope  Chronic atrial fibrillation  Nonischemic myopathy  Hypertension  Hypercholesteremia  Chronic kidney disease stage III  Anemia of chronic disease  Plan  Continue present conservative management  Will repeat CT in few days as per neuro recommendation  Will DC home once cleared by neurology   LOS: 5 days    Nayef College N 10/18/2011, 11:35 AM

## 2011-10-18 NOTE — Progress Notes (Signed)
Subjective: Patient without complaints.  Repeat CT showed slight increase in prominence of hemorrhage.    Objective: Current vital signs: BP 132/68  Pulse 72  Temp(Src) 98.1 F (36.7 C) (Oral)  Resp 20  Ht 5\' 8"  (1.727 m)  Wt 73 kg (160 lb 15 oz)  BMI 24.47 kg/m2  SpO2 99% Vital signs in last 24 hours: Temp:  [98.1 F (36.7 C)-99.5 F (37.5 C)] 98.1 F (36.7 C) (03/23 0518) Pulse Rate:  [60-73] 72  (03/23 0524) Resp:  [18-20] 20  (03/23 0524) BP: (102-146)/(50-68) 132/68 mmHg (03/23 0524) SpO2:  [96 %-99 %] 99 % (03/23 0524)  Intake/Output from previous day: 03/22 0701 - 03/23 0700 In: 840 [P.O.:840] Out: 300 [Urine:300] Intake/Output this shift:   Nutritional status: Cardiac  Neurologic Exam: Mental Status:  Alert, oriented, thought content appropriate. Speech fluent without evidence of aphasia. Able to follow 3 step commands without difficulty.  Cranial Nerves:  II-Visual fields intact.  III/IV/VI-Extraocular movements full and conjugate. Pupils reacted normally to light.  V/VII-left facial decreased sensation but unchanged and no facial weakness  VIII-grossly intact  X-normal speech and palatal movement  XII-midline tongue extension  Motor: 5/5 bilaterally with decreased ROM at the shoulders bilaterally Sensory: light touch intact throughout Deep Tendon Reflexes: 1+ and symmetric throughout  Plantars: Downgoing bilaterally  Cerebellar: Normal finger-to-nose and heel to shin      Lab Results: No results found for this or any previous visit (from the past 48 hour(s)).  No results found for this or any previous visit (from the past 240 hour(s)).  Lipid Panel No results found for this basename: CHOL,TRIG,HDL,CHOLHDL,VLDL,LDLCALC in the last 72 hours  Studies/Results: Ct Head Wo Contrast  10/17/2011  *RADIOLOGY REPORT*  Clinical Data: Followup intracranial hemorrhage.  Left facial tingling, left hand tingling and decreased sensation left leg.  CT HEAD  WITHOUT CONTRAST  Technique:  Contiguous axial images were obtained from the base of the skull through the vertex without contrast.  Comparison: 10/15/2011.  Findings: Right cerebral peduncle/midbrain hematoma measures 14.2 x 12.5 mm versus prior 13.8 x 12 mm.  Left peri mesencephalic subarachnoid/subdural blood noted.  This may be related to venous injury from recent fall.  Injury to the left posterior cerebral artery branch vessel not entirely a excluded.  Small amount of intraventricular blood dependent aspect of the lateral ventricles minimally more notable than on prior exam.  Infarct right thalamus appears remote.  Small vessel disease type changes without CT evidence of large acute thrombotic infarct.  No intracranial mass lesion noted as separate from the above described findings.  Global atrophy.  Ventricular prominence probably related to atrophy and without change.  IMPRESSION: Right cerebral peduncle/midbrain hematoma appears minimally more prominent.  Relatively similar appearance of subarachnoid/subdural hemorrhage left posterior perimesencephalic cistern.  Minimal increase in dependent interventricular blood.  Original Report Authenticated By: Fuller Canada, M.D.    Medications:  I have reviewed the patient's current medications. Scheduled:   . B-complex with vitamin C  1 tablet Oral Daily  . carvedilol  6.25 mg Oral BID WC  . cholecalciferol  5,000 Units Oral Daily  . finasteride  5 mg Oral Daily  . mulitivitamin with minerals  1 tablet Oral Daily  . omega-3 acid ethyl esters  1 g Oral BID  . simvastatin  20 mg Oral q1800  . Tamsulosin HCl  0.4 mg Oral Daily  . 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  Will need to continue to follow   Plan:  1.  Patient to get up with PT for ambulation  2.  Repeat CT in AM       LOS: 5 days   Thana Farr, MD [facility] [phone] 10/18/2011  8:39 AM

## 2011-10-19 ENCOUNTER — Inpatient Hospital Stay (HOSPITAL_COMMUNITY): Payer: Medicare Other

## 2011-10-19 NOTE — Progress Notes (Signed)
Subjective:  Patient states his facial numbness has improved but continues to have numbness in the left arm and left leg. Patient denies any weakness in or leg Objective:  Vital Signs in the last 24 hours: Temp:  [97.4 F (36.3 C)-98.5 F (36.9 C)] 98.3 F (36.8 C) (03/24 0644) Pulse Rate:  [60-81] 79  (03/24 0648) Resp:  [18-19] 18  (03/24 0644) BP: (97-127)/(53-71) 103/67 mmHg (03/24 0648) SpO2:  [95 %-98 %] 96 % (03/24 0644)  Intake/Output from previous day: 03/23 0701 - 03/24 0700 In: 480 [P.O.:240; I.V.:240] Out: 1600 [Urine:1600] Intake/Output from this shift: Total I/O In: 480 [P.O.:480] Out: 500 [Urine:500]  Physical Exam: Neck: no adenopathy, no carotid bruit, no JVD and supple, symmetrical, trachea midline Lungs: clear to auscultation bilaterally Heart: irregularly irregular rhythm, S1, S2 normal and Soft systolic murmur noted Abdomen: soft, non-tender; bowel sounds normal; no masses,  no organomegaly Extremities: extremities normal, atraumatic, no cyanosis or edema Neurologic: Grossly normal  Lab Results: No results found for this basename: WBC:2,HGB:2,PLT:2 in the last 72 hours No results found for this basename: NA:2,K:2,CL:2,CO2:2,GLUCOSE:2,BUN:2,CREATININE:2 in the last 72 hours No results found for this basename: TROPONINI:2,CK,MB:2 in the last 72 hours Hepatic Function Panel No results found for this basename: PROT,ALBUMIN,AST,ALT,ALKPHOS,BILITOT,BILIDIR,IBILI in the last 72 hours No results found for this basename: CHOL in the last 72 hours No results found for this basename: PROTIME in the last 72 hours  Imaging: Imaging results have been reviewed and Ct Head Wo Contrast  10/19/2011  *RADIOLOGY REPORT*  Clinical Data: Generalized weakness.  Slurred speech.  CT HEAD WITHOUT CONTRAST  Technique:  Contiguous axial images were obtained from the base of the skull through the vertex without contrast.  Comparison: 10/17/2011.  Findings: Right middle cerebral  peduncle/midbrain focal hematoma without significant change.  Collection of blood left posterior ambient cisterna/tentorial region minimally decreased in size.  Dependent interventricular blood decreased minimally in size/conspicuity.  No new area of intracranial hemorrhage.  Global atrophy with mild ventricular prominence stable.  Remote right thalamic infarct.  No CT evidence of large acute thrombotic infarct.  Small vessel disease type changes most prominent left sub insular region.  Vascular calcifications.  IMPRESSION: Minimal decrease in amount of blood left posterior ambient cistern and interventricular region with right middle cerebral peduncle/midbrain focal hematoma without significant change.  Original Report Authenticated By: Fuller Canada, M.D.    Cardiac Studies:  Assessment/Plan:  Right cerebral peduncle/midbrain hematoma status post fall  Subarachnoid/subdural hemorrhage left peri mesencephalic cistern  Status post fall syncope  Chronic atrial fibrillation  Nonischemic myopathy  Hypertension  Hypercholesteremia  Chronic kidney disease stage III  Anemia of chronic disease  Plan  Continue present management Increase ambulation as tolerated   LOS: 6 days    [name] N 10/19/2011, 2:02 PM

## 2011-10-19 NOTE — Progress Notes (Signed)
Subjective: Patient without complaints.  Repeat head CT performed today and shows a minimal decrease in the amount of hemorrhage.    Objective: Current vital signs: BP 103/67  Pulse 79  Temp(Src) 98.3 F (36.8 C) (Oral)  Resp 18  Ht 5\' 8"  (1.727 m)  Wt 73 kg (160 lb 15 oz)  BMI 24.47 kg/m2  SpO2 96% Vital signs in last 24 hours: Temp:  [98.3 F (36.8 C)-98.5 F (36.9 C)] 98.3 F (36.8 C) (03/24 0644) Pulse Rate:  [60-81] 79  (03/24 0648) Resp:  [18-19] 18  (03/24 0644) BP: (103-127)/(58-71) 103/67 mmHg (03/24 0648) SpO2:  [96 %-98 %] 96 % (03/24 0644)  Intake/Output from previous day: 03/23 0701 - 03/24 0700 In: 480 [P.O.:240; I.V.:240] Out: 1600 [Urine:1600] Intake/Output this shift: Total I/O In: 480 [P.O.:480] Out: 500 [Urine:500] Nutritional status: Cardiac  Neurologic Exam: Mental Status:  Alert, oriented, thought content appropriate. Speech fluent without evidence of aphasia. Able to follow 3 step commands without difficulty.  Cranial Nerves:  II-Visual fields intact.  III/IV/VI-Extraocular movements full and conjugate. Pupils reacted normally to light.  V/VII-left facial decreased sensation but unchanged and no facial weakness  VIII-grossly intact  X-normal speech and palatal movement  XII-midline tongue extension  Motor: 5/5 bilaterally with decreased ROM at the shoulders bilaterally  Sensory: light touch intact throughout  Deep Tendon Reflexes: 1+ and symmetric throughout  Plantars: Downgoing bilaterally  Cerebellar: Normal finger-to-nose and heel to shin    Lab Results: No results found for this or any previous visit (from the past 48 hour(s)).  No results found for this or any previous visit (from the past 240 hour(s)).  Lipid Panel No results found for this basename: CHOL,TRIG,HDL,CHOLHDL,VLDL,LDLCALC in the last 72 hours  Studies/Results: Ct Head Wo Contrast  10/19/2011  *RADIOLOGY REPORT*  Clinical Data: Generalized weakness.  Slurred speech.   CT HEAD WITHOUT CONTRAST  Technique:  Contiguous axial images were obtained from the base of the skull through the vertex without contrast.  Comparison: 10/17/2011.  Findings: Right middle cerebral peduncle/midbrain focal hematoma without significant change.  Collection of blood left posterior ambient cisterna/tentorial region minimally decreased in size.  Dependent interventricular blood decreased minimally in size/conspicuity.  No new area of intracranial hemorrhage.  Global atrophy with mild ventricular prominence stable.  Remote right thalamic infarct.  No CT evidence of large acute thrombotic infarct.  Small vessel disease type changes most prominent left sub insular region.  Vascular calcifications.  IMPRESSION: Minimal decrease in amount of blood left posterior ambient cistern and interventricular region with right middle cerebral peduncle/midbrain focal hematoma without significant change.  Original Report Authenticated By: Fuller Canada, M.D.    Medications:  I have reviewed the patient's current medications. Scheduled:   . B-complex with vitamin C  1 tablet Oral Daily  . carvedilol  6.25 mg Oral BID WC  . cholecalciferol  5,000 Units Oral Daily  . finasteride  5 mg Oral Daily  . mulitivitamin with minerals  1 tablet Oral Daily  . omega-3 acid ethyl esters  1 g Oral BID  . simvastatin  20 mg Oral q1800  . Tamsulosin HCl  0.4 mg Oral Daily  . 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  Imaging has stabilized as well with no further progression noted of hemorrhage.      Plan: 1. If ambulates well with PT may be stable for  D/C with neurology follow up a an outpatient      LOS: 6 days   Thana Farr, MD [facility] [phone] 10/19/2011  3:18 PM

## 2011-10-20 NOTE — Progress Notes (Signed)
10-20-11 Farley.Howells MD Care Manager set up outpatient physical therapy. Please write Rx for outpatient therapy and specify evaluation and treatment for balance and gait training. Thanks.  Outpatient Physical Therapy set up @ [facility] [address] [place], [place] [address]. [phone] Scheduled for  Thursday October 23, 2011 with Tresa Endo. Please make sure you have the Prescription for services. Gala Lewandowsky, RN,BSN [phone]

## 2011-10-20 NOTE — Progress Notes (Signed)
Pt given discharge instructions, medication lists, follow up appointments, when to call the doctor and instructions for outpatient PT. Pt verbalized understanding of instructions. [name]  Pt will contact patient advocate about missing suspenders.  Pt believes suspenders did not get placed in patient belonging bag with transferred from ED. [name]

## 2011-10-20 NOTE — Progress Notes (Signed)
   CARE MANAGEMENT NOTE 10/20/2011  Patient:  [name], [name]   Account Number:  [id number]  Date Initiated:  10/20/2011  Documentation initiated by:  GRAVES-BIGELOW,Jkayla Spiewak  Subjective/Objective Assessment:   Pt admitted with fall- syncopal episode. Pt has HX: HTN, chronic a fib, CRI. Pt is form ALF. PT is working with pt and CM will f/u for recommendations.     Action/Plan:   CM did call CSW to make her aware that pt is from ALF.   Anticipated DC Date:  10/20/2011   Anticipated DC Plan:  ASSISTED LIVING / REST HOME  In-house referral  Clinical Social Worker      DC Planning Services  CM consult      Choice offered to / List presented to:             Status of service:  Completed, signed off Medicare Important Message given?   (If response is "NO", the following Medicare IM given date fields will be blank) Date Medicare IM given:   Date Additional Medicare IM given:    Discharge Disposition:  ASSISTED LIVING  Per UR Regulation:    If discussed at Long Length of Stay Meetings, dates discussed:    Comments:  10-20-11 1005  Tomi Bamberger, RN,BSN [phone] CM will continue to monitor for d/c disposition needs.

## 2011-10-20 NOTE — Discharge Instructions (Addendum)
Outpatient Physical Therapy set up @ [facility] [address] [place], [place] [address]. [phone] Scheduled for Thursday October 23, 2011 with Tresa Endo. Appointment time: 3:30. Please arrive 15 minutes earlier for registration @ 3:15. Please make sure you have the Prescription for services.

## 2011-10-20 NOTE — Progress Notes (Addendum)
Clinical Social Work Department BRIEF PSYCHOSOCIAL ASSESSMENT 10/20/2011  Patient:  [name], [name]     Account Number:  [id number]     Admit date:  10/13/2011  Clinical Social Worker:  Hulan Fray  Date/Time:  10/20/2011 10:49 AM  Referred by:  Care Management  Date Referred:  10/20/2011 Referred for  ALF Placement   Other Referral:   Interview type:  Patient Other interview type:    PSYCHOSOCIAL DATA Living Status:  FACILITY Admitted from facility:   Level of care:  Independent Living Primary support name:  [name] Primary support relationship to patient:  SIBLING Degree of support available:   Supportive    CURRENT CONCERNS Current Concerns  None Noted   Other Concerns:    SOCIAL WORK ASSESSMENT / PLAN CSW met with patient to discuss discharge planning. Patient stated he is from [facility] Patient is from the [facility] [phone]). Patient is alert and oriented x3. Patient did not state any concerns. CSW will continue to follow and await PT/OT recommendations.   Assessment/plan status:  Other - See comment Other assessment/ plan:   CSW will await PT/OT recommendation. 14:19pm PT recommends outpatient PT or HHPT. CSW will sign off.    Information/referral to community resources:    PATIENT'S/FAMILY'S RESPONSE TO PLAN OF CARE: Patient is hopeful to return back to [facility] Patient states that he has a walker he can use at his apartment. Patient stated that if he did need a higher level of care for short term rehab, he is agreeable to it.

## 2011-10-20 NOTE — Evaluation (Signed)
Physical Therapy Evaluation Patient Details Name: [name] MRN: [id number] DOB: 1927/04/26 Today's Date: 10/20/2011  Problem List:  Patient Active Problem List  Diagnoses  . ATRIAL FIBRILLATION  . RENAL INSUFFICIENCY  . PACEMAKER, PERMANENT  . Syncope    Past Medical History:  Past Medical History  Diagnosis Date  . CHF (congestive heart failure)   . Atrial fibrillation   . HTN (hypertension)   . Pacemaker     [employer]   . Arthritis     incl spinal stenosis  . Renal insufficiency     question degree   Past Surgical History:  Past Surgical History  Procedure Date  . Bilateral shoulder surgery   . Tonsillectomy   . Prosthetic hip - bilaterally   . R knee quadricep tendon repair     PT Assessment/Plan/Recommendation PT Assessment Clinical Impression Statement: Pt with recent fall and syncopal episode. Pt demonstrates balance deficits and will benefit from further therapy to maximize mobility and balance at discharge. Pt states he drives for his groceries and errands and does not want to stop driving. If MD approves driving then recommend OPPT for balance and if unable to drive then HHPT. Will continue to follow acutely. Further balance assessment not performed secondary to arrival of lunch. PT Recommendation/Assessment: Patient will need skilled PT in the acute care venue PT Problem List: Decreased range of motion;Decreased knowledge of use of DME;Decreased mobility;Decreased balance Barriers to Discharge: Decreased caregiver support PT Therapy Diagnosis : Abnormality of gait PT Plan PT Frequency: Min 3X/week PT Treatment/Interventions: Gait training;DME instruction;Stair training;Functional mobility training;Therapeutic activities;Balance training;Patient/family education PT Recommendation Follow Up Recommendations: Outpatient PT Equipment Recommended: None recommended by PT PT Goals  Acute Rehab PT Goals PT Goal Formulation: With patient Time For Goal  Achievement: 7 days Pt will Ambulate: >150 feet;with modified independence;with least restrictive assistive device PT Goal: Ambulate - Progress: Goal set today Pt will Go Up / Down Stairs: Flight;with modified independence;with rail(s) PT Goal: Up/Down Stairs - Progress: Goal set today Additional Goals Additional Goal #1: Pt will complete dynamic gait index to further assess balance deficits. PT Goal: Additional Goal #1 - Progress: Goal set today  PT Evaluation Precautions/Restrictions  Precautions Precautions: Fall Prior Functioning  Home Living Lives With: Alone Type of Home: Independent living facility Home Layout: One level Home Access: Level entry Bathroom Shower/Tub: Health visitor: Standard Home Adaptive Equipment: Built-in shower seat;Straight cane;Walker - rolling;Bedside commode/3-in-1;Sock aid Prior Function Level of Independence: Independent with basic ADLs;Independent with transfers;Independent with homemaking with ambulation;Independent with gait Driving: Yes Vocation: Retired Producer, television/film/video: Awake/alert Overall Cognitive Status: Appears within functional limits for tasks assessed Orientation Level: Oriented X4 Sensation/Coordination Sensation Light Touch: Impaired by gross assessment Additional Comments: Pt able to sense touch to all extremities but reports LUE decreased with slight tingly compared to [employer] Gross Motor Movements are Fluid and Coordinated: Yes Extremity Assessment RUE Assessment RUE Assessment: Exceptions to [facility] RUE AROM (degrees) RUE Overall AROM Comments: pt grossly 120 degree shoulder flexion limited by mulitple RCR, unable to resist movement with strength LUE Assessment LUE Assessment: Exceptions to WFL LUE AROM (degrees) LUE Overall AROM Comments: pt grossly 120 degree shoulder flexion limited by mulitple RCR, unable to resist movement with strength RLE Assessment RLE Assessment: Within  Functional Limits LLE Assessment LLE Assessment: Within Functional Limits Mobility (including Balance) Bed Mobility Bed Mobility: Yes Supine to Sit: 6: Modified independent (Device/Increase time);HOB flat Transfers Transfers: Yes Sit to Stand: 6: Modified independent (Device/Increase time);From  bed Stand to Sit: 6: Modified independent (Device/Increase time);To chair/3-in-1;With armrests Ambulation/Gait Ambulation/Gait: Yes Ambulation/Gait Assistance: 5: Supervision Ambulation/Gait Assistance Details (indicate cue type and reason): Pt maintains flexed trunk and cueing throughout to step into RW. Pt with periodic scissoring with external rotation of RLE and crossing midline with RLE, pt unaware.  Ambulation Distance (Feet): 450 Feet Assistive device: Rolling walker Gait Pattern: Decreased stride length;Trunk flexed;Scissoring Stairs: No  Posture/Postural Control Posture/Postural Control: Postural limitations Postural Limitations: kyphotic posture Balance Balance Assessed: Yes Static Sitting Balance Static Sitting - Balance Support: Feet supported;Right upper extremity supported Static Sitting - Level of Assistance: 5: Stand by assistance Static Sitting - Comment/# of Minutes: 4, pt with posterior lean with any lower extremity or upper extremity movement Static Standing Balance Static Standing - Balance Support: Bilateral upper extremity supported Static Standing - Level of Assistance: 5: Stand by assistance Static Standing - Comment/# of Minutes: 5 min at sink during teeth brushing, pt with left lean but able to maintain balance without physical assist Exercise    End of Session PT - End of Session Equipment Utilized During Treatment: Gait belt Activity Tolerance: Patient tolerated treatment well Patient left: in chair;with call bell in reach General Behavior During Session: [facility] for tasks performed Cognition: [facility] for tasks performed  Delorse Lek 10/20/2011, 12:24  PM  Delaney Meigs, PT [phone]

## 2011-10-21 NOTE — Discharge Summary (Signed)
NAMENAETHAN, [name] NO.:  [id number]  MEDICAL RECORD NO.:  [id number]  LOCATION:  2031                         FACILITY:  [facility]  PHYSICIAN:  Eduardo Osier. Sharyn Lull, M.D. DATE OF BIRTH:  [birth year]  DATE OF ADMISSION:  10/13/2011 DATE OF DISCHARGE:  10/20/2011                              DISCHARGE SUMMARY   ADMITTING DIAGNOSES:  Syncope, rule out cardiac arrhythmias, rule out orthostatic hypotension, rule out coronary insufficiency, nonischemic cardiomyopathy, chronic atrial fibrillation, hypertension, hypercholesteremia, chronic kidney disease stage III, anemia of chronic disease, sick sinus syndrome, status post permanent pacemaker in the past.  DISCHARGE DIAGNOSES:  Status post syncope/fall, status post right cerebral peduncle/midbrain hematoma/subarachnoid and subdural hemorrhage in the left perimesencephalic cistern, chronic atrial fibrillation, nonischemic cardiomyopathy, hypertension, hypercholesteremia, chronic kidney disease stage III, anemia of chronic disease, sick sinus syndrome, status post permanent pacemaker.  DISCHARGE HOME MEDICATIONS: 1. Carvedilol 3.125 mg 2 tablets by mouth twice daily as before. 2. Digoxin 0.125 mg 1 tab daily. 3. Proscar 5 mg 1 tablet daily. 4. Fish oil 1000 mg 1 capsule 3 times daily. 5. Lisinopril 10 mg 1 tablet daily. 6. Multivitamin with mineral 1 tab daily as before. 7. B complex with vitamin C 1 tablet daily as before. 8. Pravastatin 20 mg 1 tablet daily. 9. Flomax 0.4 mg daily. 10.Thiamin 100 mg daily. 11.Vitamin D3 5000 units by mouth daily.  The patient has been advised     to stop spironolactone, tramadol, warfarin and acetaminophen.  The     patient also has been advised not to take any aspirin or NSAIDs.  Follow up with me in 1 week.  Follow up with Neuro in 2 weeks.  Get the repeat CT of the brain as outpatient in 1-2 weeks.  Condition at discharge is stable.  The patient is scheduled for outpatient PT  for evaluation and treatment for balance and gait training.  The patient has been advised to call 911 if he develops any severe headache, dizziness, weakness, slurry speech, or blurring of vision.  BRIEF HISTORY AND HOSPITAL COURSE:  Mr. Chadderdon is 78 year old male with past medical history significant for multiple medical problems, i.e. hypertension, chronic atrial fibrillation, history of systolic heart failure, history of sick sinus syndrome, status post permanent pacemaker, degenerative joint disease, history of spinal stenosis, hypercholesteremia, chronic renal insufficiency stage III.  He came to the ER via EMS following syncopal episode.  The patient states he was visiting his friend at [facility].  While walking there, he felt lightheaded and felt that he was going to pass out.  He sat down on the floor and suddenly had syncopal episode, then he woke up and noticed ambulance was there.  The patient denies any palpitation prior to syncopal episode.  Denies any chest pain, nausea, or vomiting.  Denies any weakness in the arms or legs.  Denies any seizure activity.  Denies such episodes of syncope in the past.  The patient had single-chamber pacemaker, which was interrogated in the ER, which showed normal device function with no episodes of VT or VFib.  The patient states he had fall last Friday when he tripped in the bathroom and had small  laceration over his forehead and ecchymosis around the left periorbital and left facial area.  No syncope was noted at that time.  PAST MEDICAL HISTORY:  As above.  PAST SURGICAL HISTORY:  He had bilateral shoulder surgery in the past, had tonsillectomy in the past, had prostatic hip replacement bilaterally in the past, had right knee quadriceps tendon repair in the past.  SOCIAL HISTORY:  He is single, used to smoke, quit 40+ years ago.  No history of alcohol abuse, but drinks socially.  PHYSICAL EXAMINATION:  GENERAL:  He was  alert, awake, and oriented x3. VITAL SIGNS:  Blood pressure was 109/59, pulse was 63.  He was afebrile. HEENT:  Conjunctivae was pink.  Left orbital area, there was small laceration with ecchymosis noted. NECK:  Supple.  No JVD.  No bruit. LUNGS:  Clear to auscultation without rhonchi or rales. CARDIOVASCULAR:  S1, S2 was normal.  There was soft systolic murmur. There was no S3 gallop. ABDOMEN:  Soft.  Bowel sounds were present.  Nontender. EXTREMITIES:  There was no clubbing, cyanosis, or edema. NEURO:  Grossly intact except for bilateral pinpoint pupils.  LABORATORY STUDIES:  CT of the brain showed no skull fracture or intracranial hemorrhage.  Soft tissue swelling, lateral aspect of the left orbital region.  CT of cervical spine showed no cervical spine fracture.  Repeat CT of the brain done 2 days later on October 15, 2011 showed development of 1.3-cm hematoma in the right cerebral peduncle and acute blood in the perimesencephalic cistern more on the left side. These hemorrhages are sequelae of trauma 2 days ago, although hemorrhage was not detectable in anyway at that time.  Repeat CT of the brain done on 20-second showed right cerebral peduncle/mid-brain hematoma, which appears to be minimally more prominent, relatively similar appearance of subarachnoid/subdural hemorrhage/posterior perimesencephalic cistern, minimal increase in dependent interventricular blood.  CT of the brain was repeated again on October 19, 2011, which showed minimal decrease in amount of blood, left posterior ambient cistern and interventricular region with right middle cerebral peduncle/mid brain focal hematoma without significant change.  His other labs; sodium was 140, potassium 4.7, BUN 27, creatinine 1.43.  Labs on October 15, 2011; sodium 139, potassium 4.0, BUN 26, creatinine 1.35.  His three sets of cardiac enzymes were negative.  Cholesterol was 152, triglycerides 98, HDL 54, LDL 78.  Hemoglobin was  12.6, hematocrit 36.7, white count of 6.1. Repeat hemoglobin was 10.7, hematocrit 31.7, white count of 4.7, which has been stable in last few days.  His TSH was 2.44.  His admission PT was 15.2, INR 1.18.  Repeat PT was 15.2, INR 1.18.  On October 14, 2011; PT was 17, INR 1.36.  BRIEF HOSPITAL COURSE:  The patient was admitted to telemetry unit.  MI was ruled out by serial enzymes and EKG.  The patient did not have any episodes of cardiac arrhythmias during the hospital stay.  His initial CT was negative for bleed.  His INR was subtherapeutic.  The patient was started on heparin and Coumadin.  Two days later, the patient developed left-sided numbness and left arm and leg and left facial numbness. Repeat CT was done, which showed intracerebral/mid-brain hemorrhage as above.  The patient's Coumadin and heparin were discontinued.  Neuro consult was obtained.  The patient was treated conservatively.  The patient remained stable during the hospital stay.  His left arm and leg numbness has gradually improved, so his left facial swelling and small hematoma, which is resolving.  OT/PT consultation was obtained.  The patient has been ambulating in hallway without any problems.  His CT of the brain is stable with no new neuro deficits.  The patient will be discharged home on above medications and will be followed up in my office in 1 week and Neuro in 2 weeks.     Eduardo Osier. Sharyn Lull, M.D.     [facility]  D:  10/20/2011  T:  10/21/2011  Job:  [id number]

## 2011-10-23 ENCOUNTER — Ambulatory Visit: Payer: Medicare Other | Attending: Cardiology

## 2011-10-23 DIAGNOSIS — M6281 Muscle weakness (generalized): Secondary | ICD-10-CM | POA: Diagnosis not present

## 2011-10-23 DIAGNOSIS — R269 Unspecified abnormalities of gait and mobility: Secondary | ICD-10-CM | POA: Insufficient documentation

## 2011-10-23 DIAGNOSIS — R5381 Other malaise: Secondary | ICD-10-CM | POA: Insufficient documentation

## 2011-10-23 DIAGNOSIS — IMO0001 Reserved for inherently not codable concepts without codable children: Secondary | ICD-10-CM | POA: Diagnosis not present

## 2011-10-27 ENCOUNTER — Ambulatory Visit: Payer: Medicare Other | Attending: Cardiology | Admitting: Physical Therapy

## 2011-10-27 DIAGNOSIS — M6281 Muscle weakness (generalized): Secondary | ICD-10-CM | POA: Insufficient documentation

## 2011-10-27 DIAGNOSIS — IMO0001 Reserved for inherently not codable concepts without codable children: Secondary | ICD-10-CM | POA: Insufficient documentation

## 2011-10-27 DIAGNOSIS — R269 Unspecified abnormalities of gait and mobility: Secondary | ICD-10-CM | POA: Diagnosis not present

## 2011-10-27 DIAGNOSIS — R5381 Other malaise: Secondary | ICD-10-CM | POA: Insufficient documentation

## 2011-10-28 DIAGNOSIS — I10 Essential (primary) hypertension: Secondary | ICD-10-CM | POA: Diagnosis not present

## 2011-10-28 DIAGNOSIS — I4891 Unspecified atrial fibrillation: Secondary | ICD-10-CM | POA: Diagnosis not present

## 2011-10-28 DIAGNOSIS — E7800 Pure hypercholesterolemia, unspecified: Secondary | ICD-10-CM | POA: Diagnosis not present

## 2011-10-29 ENCOUNTER — Ambulatory Visit: Payer: Medicare Other | Admitting: Physical Therapy

## 2011-11-01 DIAGNOSIS — M25559 Pain in unspecified hip: Secondary | ICD-10-CM | POA: Diagnosis not present

## 2011-11-01 DIAGNOSIS — I509 Heart failure, unspecified: Secondary | ICD-10-CM | POA: Diagnosis not present

## 2011-11-01 DIAGNOSIS — N182 Chronic kidney disease, stage 2 (mild): Secondary | ICD-10-CM | POA: Diagnosis not present

## 2011-11-01 DIAGNOSIS — R031 Nonspecific low blood-pressure reading: Secondary | ICD-10-CM | POA: Diagnosis not present

## 2011-11-01 DIAGNOSIS — I4891 Unspecified atrial fibrillation: Secondary | ICD-10-CM | POA: Diagnosis not present

## 2011-11-01 DIAGNOSIS — M129 Arthropathy, unspecified: Secondary | ICD-10-CM | POA: Diagnosis not present

## 2011-11-01 DIAGNOSIS — I959 Hypotension, unspecified: Secondary | ICD-10-CM | POA: Diagnosis not present

## 2011-11-01 DIAGNOSIS — R296 Repeated falls: Secondary | ICD-10-CM | POA: Diagnosis not present

## 2011-11-01 DIAGNOSIS — T84029A Dislocation of unspecified internal joint prosthesis, initial encounter: Secondary | ICD-10-CM | POA: Diagnosis not present

## 2011-11-01 DIAGNOSIS — Z96649 Presence of unspecified artificial hip joint: Secondary | ICD-10-CM | POA: Diagnosis not present

## 2011-11-01 DIAGNOSIS — M4800 Spinal stenosis, site unspecified: Secondary | ICD-10-CM | POA: Diagnosis not present

## 2011-11-01 DIAGNOSIS — W19XXXA Unspecified fall, initial encounter: Secondary | ICD-10-CM | POA: Diagnosis not present

## 2011-11-01 DIAGNOSIS — Z79899 Other long term (current) drug therapy: Secondary | ICD-10-CM | POA: Diagnosis not present

## 2011-11-01 DIAGNOSIS — S73006A Unspecified dislocation of unspecified hip, initial encounter: Secondary | ICD-10-CM | POA: Diagnosis not present

## 2011-11-01 DIAGNOSIS — R404 Transient alteration of awareness: Secondary | ICD-10-CM | POA: Diagnosis not present

## 2011-11-01 DIAGNOSIS — Z950 Presence of cardiac pacemaker: Secondary | ICD-10-CM | POA: Diagnosis not present

## 2011-11-01 DIAGNOSIS — M79609 Pain in unspecified limb: Secondary | ICD-10-CM | POA: Diagnosis not present

## 2011-11-01 DIAGNOSIS — I10 Essential (primary) hypertension: Secondary | ICD-10-CM | POA: Diagnosis not present

## 2011-11-02 DIAGNOSIS — I4891 Unspecified atrial fibrillation: Secondary | ICD-10-CM | POA: Diagnosis not present

## 2011-11-02 DIAGNOSIS — R031 Nonspecific low blood-pressure reading: Secondary | ICD-10-CM | POA: Diagnosis not present

## 2011-11-04 DIAGNOSIS — S72009A Fracture of unspecified part of neck of unspecified femur, initial encounter for closed fracture: Secondary | ICD-10-CM | POA: Diagnosis not present

## 2011-11-04 DIAGNOSIS — Z96649 Presence of unspecified artificial hip joint: Secondary | ICD-10-CM | POA: Diagnosis not present

## 2011-11-04 DIAGNOSIS — Z4789 Encounter for other orthopedic aftercare: Secondary | ICD-10-CM | POA: Diagnosis not present

## 2011-11-04 DIAGNOSIS — N400 Enlarged prostate without lower urinary tract symptoms: Secondary | ICD-10-CM | POA: Diagnosis not present

## 2011-11-04 DIAGNOSIS — I959 Hypotension, unspecified: Secondary | ICD-10-CM | POA: Diagnosis not present

## 2011-11-04 DIAGNOSIS — I509 Heart failure, unspecified: Secondary | ICD-10-CM | POA: Diagnosis not present

## 2011-11-04 DIAGNOSIS — I4891 Unspecified atrial fibrillation: Secondary | ICD-10-CM | POA: Diagnosis not present

## 2011-11-04 DIAGNOSIS — S73006A Unspecified dislocation of unspecified hip, initial encounter: Secondary | ICD-10-CM | POA: Diagnosis not present

## 2011-11-04 DIAGNOSIS — T84029A Dislocation of unspecified internal joint prosthesis, initial encounter: Secondary | ICD-10-CM | POA: Diagnosis not present

## 2011-11-04 DIAGNOSIS — M25559 Pain in unspecified hip: Secondary | ICD-10-CM | POA: Diagnosis not present

## 2011-11-04 DIAGNOSIS — R262 Difficulty in walking, not elsewhere classified: Secondary | ICD-10-CM | POA: Diagnosis not present

## 2011-11-04 DIAGNOSIS — I10 Essential (primary) hypertension: Secondary | ICD-10-CM | POA: Diagnosis not present

## 2011-11-04 DIAGNOSIS — M4150 Other secondary scoliosis, site unspecified: Secondary | ICD-10-CM | POA: Diagnosis not present

## 2011-11-05 ENCOUNTER — Encounter: Payer: Medicare Other | Admitting: Physical Therapy

## 2011-11-07 DIAGNOSIS — S72009A Fracture of unspecified part of neck of unspecified femur, initial encounter for closed fracture: Secondary | ICD-10-CM | POA: Diagnosis not present

## 2011-11-10 ENCOUNTER — Encounter: Payer: Medicare Other | Admitting: Physical Therapy

## 2011-11-12 DIAGNOSIS — S72009A Fracture of unspecified part of neck of unspecified femur, initial encounter for closed fracture: Secondary | ICD-10-CM | POA: Diagnosis not present

## 2011-11-17 ENCOUNTER — Encounter: Payer: Medicare Other | Admitting: Physical Therapy

## 2011-11-17 DIAGNOSIS — S72009A Fracture of unspecified part of neck of unspecified femur, initial encounter for closed fracture: Secondary | ICD-10-CM | POA: Diagnosis not present

## 2011-11-19 ENCOUNTER — Encounter: Payer: Medicare Other | Admitting: Physical Therapy

## 2011-11-20 DIAGNOSIS — E7800 Pure hypercholesterolemia, unspecified: Secondary | ICD-10-CM | POA: Diagnosis not present

## 2011-11-20 DIAGNOSIS — I428 Other cardiomyopathies: Secondary | ICD-10-CM | POA: Diagnosis not present

## 2011-11-20 DIAGNOSIS — I10 Essential (primary) hypertension: Secondary | ICD-10-CM | POA: Diagnosis not present

## 2011-11-20 DIAGNOSIS — I4891 Unspecified atrial fibrillation: Secondary | ICD-10-CM | POA: Diagnosis not present

## 2011-11-24 ENCOUNTER — Encounter: Payer: Medicare Other | Admitting: Physical Therapy

## 2011-11-26 ENCOUNTER — Encounter: Payer: Medicare Other | Admitting: Physical Therapy

## 2011-12-03 ENCOUNTER — Ambulatory Visit: Payer: Medicare Other

## 2011-12-03 ENCOUNTER — Inpatient Hospital Stay (HOSPITAL_COMMUNITY)
Admission: EM | Admit: 2011-12-03 | Discharge: 2011-12-12 | DRG: 981 | Disposition: A | Discharge status: Skilled Nursing Facility | Payer: Medicare Other | Attending: Cardiology | Admitting: Cardiology

## 2011-12-03 ENCOUNTER — Encounter (HOSPITAL_COMMUNITY): Payer: Self-pay | Admitting: Emergency Medicine

## 2011-12-03 ENCOUNTER — Emergency Department (HOSPITAL_COMMUNITY): Payer: Medicare Other

## 2011-12-03 ENCOUNTER — Inpatient Hospital Stay (HOSPITAL_COMMUNITY): Payer: Medicare Other

## 2011-12-03 DIAGNOSIS — N183 Chronic kidney disease, stage 3 unspecified: Secondary | ICD-10-CM | POA: Diagnosis present

## 2011-12-03 DIAGNOSIS — Y92009 Unspecified place in unspecified non-institutional (private) residence as the place of occurrence of the external cause: Secondary | ICD-10-CM

## 2011-12-03 DIAGNOSIS — J154 Pneumonia due to other streptococci: Secondary | ICD-10-CM | POA: Diagnosis not present

## 2011-12-03 DIAGNOSIS — T148XXA Other injury of unspecified body region, initial encounter: Secondary | ICD-10-CM | POA: Diagnosis not present

## 2011-12-03 DIAGNOSIS — I43 Cardiomyopathy in diseases classified elsewhere: Secondary | ICD-10-CM | POA: Diagnosis not present

## 2011-12-03 DIAGNOSIS — M25539 Pain in unspecified wrist: Secondary | ICD-10-CM | POA: Diagnosis not present

## 2011-12-03 DIAGNOSIS — M659 Synovitis and tenosynovitis, unspecified: Secondary | ICD-10-CM | POA: Diagnosis not present

## 2011-12-03 DIAGNOSIS — R0602 Shortness of breath: Secondary | ICD-10-CM | POA: Diagnosis not present

## 2011-12-03 DIAGNOSIS — D631 Anemia in chronic kidney disease: Secondary | ICD-10-CM | POA: Diagnosis present

## 2011-12-03 DIAGNOSIS — M542 Cervicalgia: Secondary | ICD-10-CM | POA: Diagnosis not present

## 2011-12-03 DIAGNOSIS — R4701 Aphasia: Secondary | ICD-10-CM | POA: Diagnosis not present

## 2011-12-03 DIAGNOSIS — R4789 Other speech disturbances: Secondary | ICD-10-CM | POA: Diagnosis not present

## 2011-12-03 DIAGNOSIS — E860 Dehydration: Secondary | ICD-10-CM | POA: Diagnosis not present

## 2011-12-03 DIAGNOSIS — I428 Other cardiomyopathies: Secondary | ICD-10-CM | POA: Diagnosis present

## 2011-12-03 DIAGNOSIS — G459 Transient cerebral ischemic attack, unspecified: Secondary | ICD-10-CM | POA: Diagnosis present

## 2011-12-03 DIAGNOSIS — B950 Streptococcus, group A, as the cause of diseases classified elsewhere: Secondary | ICD-10-CM | POA: Diagnosis present

## 2011-12-03 DIAGNOSIS — R079 Chest pain, unspecified: Secondary | ICD-10-CM | POA: Diagnosis not present

## 2011-12-03 DIAGNOSIS — R7881 Bacteremia: Secondary | ICD-10-CM | POA: Diagnosis present

## 2011-12-03 DIAGNOSIS — S20219A Contusion of unspecified front wall of thorax, initial encounter: Secondary | ICD-10-CM | POA: Diagnosis not present

## 2011-12-03 DIAGNOSIS — I509 Heart failure, unspecified: Secondary | ICD-10-CM | POA: Diagnosis not present

## 2011-12-03 DIAGNOSIS — R569 Unspecified convulsions: Secondary | ICD-10-CM | POA: Diagnosis not present

## 2011-12-03 DIAGNOSIS — I517 Cardiomegaly: Secondary | ICD-10-CM | POA: Diagnosis not present

## 2011-12-03 DIAGNOSIS — M009 Pyogenic arthritis, unspecified: Secondary | ICD-10-CM | POA: Diagnosis not present

## 2011-12-03 DIAGNOSIS — E7800 Pure hypercholesterolemia, unspecified: Secondary | ICD-10-CM | POA: Diagnosis present

## 2011-12-03 DIAGNOSIS — I4891 Unspecified atrial fibrillation: Secondary | ICD-10-CM | POA: Diagnosis present

## 2011-12-03 DIAGNOSIS — G9341 Metabolic encephalopathy: Secondary | ICD-10-CM | POA: Diagnosis present

## 2011-12-03 DIAGNOSIS — M519 Unspecified thoracic, thoracolumbar and lumbosacral intervertebral disc disorder: Secondary | ICD-10-CM | POA: Diagnosis not present

## 2011-12-03 DIAGNOSIS — I10 Essential (primary) hypertension: Secondary | ICD-10-CM | POA: Diagnosis not present

## 2011-12-03 DIAGNOSIS — J9819 Other pulmonary collapse: Secondary | ICD-10-CM | POA: Diagnosis not present

## 2011-12-03 DIAGNOSIS — Z96649 Presence of unspecified artificial hip joint: Secondary | ICD-10-CM

## 2011-12-03 DIAGNOSIS — I129 Hypertensive chronic kidney disease with stage 1 through stage 4 chronic kidney disease, or unspecified chronic kidney disease: Secondary | ICD-10-CM | POA: Diagnosis present

## 2011-12-03 DIAGNOSIS — I495 Sick sinus syndrome: Secondary | ICD-10-CM | POA: Diagnosis not present

## 2011-12-03 DIAGNOSIS — R197 Diarrhea, unspecified: Secondary | ICD-10-CM | POA: Diagnosis not present

## 2011-12-03 DIAGNOSIS — I6350 Cerebral infarction due to unspecified occlusion or stenosis of unspecified cerebral artery: Secondary | ICD-10-CM | POA: Diagnosis not present

## 2011-12-03 DIAGNOSIS — M25559 Pain in unspecified hip: Secondary | ICD-10-CM | POA: Diagnosis not present

## 2011-12-03 DIAGNOSIS — N039 Chronic nephritic syndrome with unspecified morphologic changes: Secondary | ICD-10-CM | POA: Diagnosis present

## 2011-12-03 DIAGNOSIS — S62109A Fracture of unspecified carpal bone, unspecified wrist, initial encounter for closed fracture: Secondary | ICD-10-CM | POA: Diagnosis not present

## 2011-12-03 DIAGNOSIS — Z5189 Encounter for other specified aftercare: Secondary | ICD-10-CM | POA: Diagnosis not present

## 2011-12-03 DIAGNOSIS — Z950 Presence of cardiac pacemaker: Secondary | ICD-10-CM | POA: Diagnosis not present

## 2011-12-03 DIAGNOSIS — D649 Anemia, unspecified: Secondary | ICD-10-CM | POA: Diagnosis not present

## 2011-12-03 DIAGNOSIS — E876 Hypokalemia: Secondary | ICD-10-CM | POA: Diagnosis not present

## 2011-12-03 DIAGNOSIS — J984 Other disorders of lung: Secondary | ICD-10-CM | POA: Diagnosis not present

## 2011-12-03 DIAGNOSIS — J189 Pneumonia, unspecified organism: Secondary | ICD-10-CM | POA: Diagnosis not present

## 2011-12-03 DIAGNOSIS — S59909A Unspecified injury of unspecified elbow, initial encounter: Secondary | ICD-10-CM | POA: Diagnosis not present

## 2011-12-03 DIAGNOSIS — W010XXA Fall on same level from slipping, tripping and stumbling without subsequent striking against object, initial encounter: Secondary | ICD-10-CM | POA: Diagnosis present

## 2011-12-03 DIAGNOSIS — D72829 Elevated white blood cell count, unspecified: Secondary | ICD-10-CM | POA: Diagnosis not present

## 2011-12-03 DIAGNOSIS — I609 Nontraumatic subarachnoid hemorrhage, unspecified: Secondary | ICD-10-CM | POA: Diagnosis not present

## 2011-12-03 DIAGNOSIS — W19XXXA Unspecified fall, initial encounter: Secondary | ICD-10-CM

## 2011-12-03 LAB — BASIC METABOLIC PANEL
BUN: 27 mg/dL — ABNORMAL HIGH (ref 6–23)
CO2: 22 mEq/L (ref 19–32)
Calcium: 8.8 mg/dL (ref 8.4–10.5)
Chloride: 99 mEq/L (ref 96–112)
Creatinine, Ser: 1.58 mg/dL — ABNORMAL HIGH (ref 0.50–1.35)
GFR calc Af Amer: 45 mL/min — ABNORMAL LOW (ref >90)
GFR calc non Af Amer: 39 mL/min — ABNORMAL LOW (ref >90)
Glucose, Bld: 190 mg/dL — ABNORMAL HIGH (ref 70–99)
Potassium: 3.3 mEq/L — ABNORMAL LOW (ref 3.5–5.1)
Sodium: 136 mEq/L (ref 135–145)

## 2011-12-03 LAB — COMPREHENSIVE METABOLIC PANEL
ALT: 19 U/L (ref 0–53)
AST: 35 U/L (ref 0–37)
Albumin: 2.9 g/dL — ABNORMAL LOW (ref 3.5–5.2)
Alkaline Phosphatase: 62 U/L (ref 39–117)
BUN: 27 mg/dL — ABNORMAL HIGH (ref 6–23)
CO2: 22 mEq/L (ref 19–32)
Calcium: 8.3 mg/dL — ABNORMAL LOW (ref 8.4–10.5)
Chloride: 98 mEq/L (ref 96–112)
Creatinine, Ser: 1.49 mg/dL — ABNORMAL HIGH (ref 0.50–1.35)
GFR calc Af Amer: 48 mL/min — ABNORMAL LOW (ref >90)
GFR calc non Af Amer: 42 mL/min — ABNORMAL LOW (ref >90)
Glucose, Bld: 158 mg/dL — ABNORMAL HIGH (ref 70–99)
Potassium: 3.4 mEq/L — ABNORMAL LOW (ref 3.5–5.1)
Sodium: 134 mEq/L — ABNORMAL LOW (ref 135–145)
Total Bilirubin: 0.6 mg/dL (ref 0.3–1.2)
Total Protein: 6.6 g/dL (ref 6.0–8.3)

## 2011-12-03 LAB — URINALYSIS, ROUTINE W REFLEX MICROSCOPIC
Bilirubin Urine: NEGATIVE
Glucose, UA: NEGATIVE mg/dL
Ketones, ur: NEGATIVE mg/dL
Leukocytes, UA: NEGATIVE
Nitrite: NEGATIVE
Protein, ur: 30 mg/dL — AB
Specific Gravity, Urine: 1.018 (ref 1.005–1.030)
Urobilinogen, UA: 0.2 mg/dL (ref 0.0–1.0)
pH: 6 (ref 5.0–8.0)

## 2011-12-03 LAB — CBC
HCT: 31.8 % — ABNORMAL LOW (ref 39.0–52.0)
HCT: 31.1 % — ABNORMAL LOW (ref 39.0–52.0)
Hemoglobin: 10.6 g/dL — ABNORMAL LOW (ref 13.0–17.0)
Hemoglobin: 10.5 g/dL — ABNORMAL LOW (ref 13.0–17.0)
MCH: 31.5 pg (ref 26.0–34.0)
MCH: 31.9 pg (ref 26.0–34.0)
MCHC: 33.3 g/dL (ref 30.0–36.0)
MCHC: 33.8 g/dL (ref 30.0–36.0)
MCV: 94.6 fL (ref 78.0–100.0)
MCV: 94.5 fL (ref 78.0–100.0)
Platelets: 136 10*3/uL — ABNORMAL LOW (ref 150–400)
Platelets: 116 10*3/uL — ABNORMAL LOW (ref 150–400)
RBC: 3.36 MIL/uL — ABNORMAL LOW (ref 4.22–5.81)
RBC: 3.29 MIL/uL — ABNORMAL LOW (ref 4.22–5.81)
RDW: 13.5 % (ref 11.5–15.5)
RDW: 13.8 % (ref 11.5–15.5)
WBC: 20.5 10*3/uL — ABNORMAL HIGH (ref 4.0–10.5)
WBC: 18.7 10*3/uL — ABNORMAL HIGH (ref 4.0–10.5)

## 2011-12-03 LAB — DIFFERENTIAL
Basophils Absolute: 0 10*3/uL (ref 0.0–0.1)
Basophils Absolute: 0 10*3/uL (ref 0.0–0.1)
Basophils Relative: 0 % (ref 0–1)
Basophils Relative: 0 % (ref 0–1)
Eosinophils Absolute: 0 10*3/uL (ref 0.0–0.7)
Eosinophils Absolute: 0 10*3/uL (ref 0.0–0.7)
Eosinophils Relative: 0 % (ref 0–5)
Eosinophils Relative: 0 % (ref 0–5)
Lymphocytes Relative: 1 % — ABNORMAL LOW (ref 12–46)
Lymphocytes Relative: 3 % — ABNORMAL LOW (ref 12–46)
Lymphs Abs: 0.2 10*3/uL — ABNORMAL LOW (ref 0.7–4.0)
Lymphs Abs: 0.6 10*3/uL — ABNORMAL LOW (ref 0.7–4.0)
Monocytes Absolute: 0.5 10*3/uL (ref 0.1–1.0)
Monocytes Absolute: 0.4 10*3/uL (ref 0.1–1.0)
Monocytes Relative: 3 % (ref 3–12)
Monocytes Relative: 2 % — ABNORMAL LOW (ref 3–12)
Neutro Abs: 19.7 10*3/uL — ABNORMAL HIGH (ref 1.7–7.7)
Neutro Abs: 17.7 10*3/uL — ABNORMAL HIGH (ref 1.7–7.7)
Neutrophils Relative %: 96 % — ABNORMAL HIGH (ref 43–77)
Neutrophils Relative %: 95 % — ABNORMAL HIGH (ref 43–77)
WBC Morphology: INCREASED

## 2011-12-03 LAB — GLUCOSE, CAPILLARY: Glucose-Capillary: 151 mg/dL — ABNORMAL HIGH (ref 70–99)

## 2011-12-03 LAB — URINE MICROSCOPIC-ADD ON

## 2011-12-03 LAB — MAGNESIUM: Magnesium: 1.6 mg/dL (ref 1.5–2.5)

## 2011-12-03 MED ORDER — ADULT MULTIVITAMIN W/MINERALS CH
1.0000 | ORAL_TABLET | Freq: Two times a day (BID) | ORAL | Status: DC
  Administered 2011-12-03 – 2011-12-12 (×18): 1 via ORAL
  Filled 2011-12-03 (×20): qty 1

## 2011-12-03 MED ORDER — OMEGA-3-ACID ETHYL ESTERS 1 G PO CAPS
1.0000 g | ORAL_CAPSULE | Freq: Every day | ORAL | Status: DC
  Administered 2011-12-04 – 2011-12-12 (×9): 1 g via ORAL
  Filled 2011-12-03 (×9): qty 1

## 2011-12-03 MED ORDER — B COMPLEX-C PO TABS
1.0000 | ORAL_TABLET | Freq: Every morning | ORAL | Status: DC
  Administered 2011-12-04 – 2011-12-12 (×9): 1 via ORAL
  Filled 2011-12-03 (×9): qty 1

## 2011-12-03 MED ORDER — TAMSULOSIN HCL 0.4 MG PO CAPS
0.8000 mg | ORAL_CAPSULE | Freq: Every morning | ORAL | Status: DC
  Administered 2011-12-04 – 2011-12-10 (×7): 0.8 mg via ORAL
  Filled 2011-12-03 (×8): qty 2

## 2011-12-03 MED ORDER — VITAMIN B-1 100 MG PO TABS
100.0000 mg | ORAL_TABLET | Freq: Every day | ORAL | Status: DC
  Administered 2011-12-04 – 2011-12-12 (×9): 100 mg via ORAL
  Filled 2011-12-03 (×9): qty 1

## 2011-12-03 MED ORDER — FOLIC ACID 1 MG PO TABS
1.0000 mg | ORAL_TABLET | Freq: Every day | ORAL | Status: DC
  Administered 2011-12-04 – 2011-12-12 (×9): 1 mg via ORAL
  Filled 2011-12-03 (×9): qty 1

## 2011-12-03 MED ORDER — DEXTROSE 5 % IV SOLN
1.0000 g | INTRAVENOUS | Status: AC
  Administered 2011-12-03 – 2011-12-09 (×7): 1 g via INTRAVENOUS
  Filled 2011-12-03 (×8): qty 10

## 2011-12-03 MED ORDER — SODIUM CHLORIDE 0.9 % IV SOLN
INTRAVENOUS | Status: DC
  Administered 2011-12-03 – 2011-12-08 (×8): via INTRAVENOUS

## 2011-12-03 MED ORDER — SODIUM CHLORIDE 0.9 % IV BOLUS (SEPSIS)
500.0000 mL | Freq: Once | INTRAVENOUS | Status: AC
  Administered 2011-12-03: 500 mL via INTRAVENOUS

## 2011-12-03 MED ORDER — SIMVASTATIN 10 MG PO TABS
10.0000 mg | ORAL_TABLET | Freq: Every day | ORAL | Status: DC
  Administered 2011-12-04 – 2011-12-11 (×7): 10 mg via ORAL
  Filled 2011-12-03 (×9): qty 1

## 2011-12-03 MED ORDER — FINASTERIDE 5 MG PO TABS
5.0000 mg | ORAL_TABLET | Freq: Every morning | ORAL | Status: DC
  Administered 2011-12-04 – 2011-12-12 (×9): 5 mg via ORAL
  Filled 2011-12-03 (×9): qty 1

## 2011-12-03 MED ORDER — DEXTROSE 5 % IV SOLN
500.0000 mg | INTRAVENOUS | Status: AC
  Administered 2011-12-03 – 2011-12-09 (×7): 500 mg via INTRAVENOUS
  Filled 2011-12-03 (×8): qty 500

## 2011-12-03 MED ORDER — ONDANSETRON HCL 4 MG/2ML IJ SOLN: 4.0000 mg | Freq: Three times a day (TID) | INTRAMUSCULAR | Status: DC | PRN

## 2011-12-03 MED ORDER — FOLIC ACID 400 MCG PO TABS: 400.0000 ug | ORAL_TABLET | Freq: Every morning | ORAL | Status: DC

## 2011-12-03 MED ORDER — CARVEDILOL 6.25 MG PO TABS
6.2500 mg | ORAL_TABLET | Freq: Two times a day (BID) | ORAL | Status: DC
  Administered 2011-12-04: 3.125 mg via ORAL
  Filled 2011-12-03 (×3): qty 1

## 2011-12-03 MED ORDER — ACETAMINOPHEN 325 MG PO TABS
650.0000 mg | ORAL_TABLET | Freq: Four times a day (QID) | ORAL | Status: DC | PRN
  Administered 2011-12-03 – 2011-12-05 (×3): 650 mg via ORAL
  Filled 2011-12-03 (×3): qty 2

## 2011-12-03 MED ORDER — LISINOPRIL 10 MG PO TABS
10.0000 mg | ORAL_TABLET | Freq: Every day | ORAL | Status: DC
  Administered 2011-12-03: 10 mg via ORAL
  Filled 2011-12-03 (×2): qty 1

## 2011-12-03 MED ORDER — DIGOXIN 125 MCG PO TABS
125.0000 ug | ORAL_TABLET | Freq: Every day | ORAL | Status: DC
  Administered 2011-12-03 – 2011-12-11 (×9): 125 ug via ORAL
  Filled 2011-12-03 (×10): qty 1

## 2011-12-03 MED ORDER — SODIUM CHLORIDE 0.9 % IV SOLN: INTRAVENOUS | Status: DC

## 2011-12-03 NOTE — H&P (Signed)
[name] is an 78 y.o. male.   Chief Complaint: Cough high-grade fever/fall HPI: Patient is 78 year old male with past rectal history significant for multiple medical problems i.e. history of syncope/fall status post right cerebral peduncle/midbrain hematoma/subarachnoid and sub-dural hemorrhage in March 2013 hypertension chronic atrial fibrillation history of systolic heart failure history of sick sinus syndrome status post permanent pacemaker history of spinal stenosis hypercholesteremia chronic venous insufficiency he came to the ER complaining off coughing associated with high-grade fever chills and had fall at home. Patient denies any her chest pain but complains of musculoskeletal rib pain. Denies any shortness of breath chest x-ray done in the ER showed bibasal atelectasis and was noted to have her elevated WBCs count of 20,000. Patient denies any urinary complaints denies any abdominal pain. States had one bout of diarrhea earlier this a.m. patient denies any recent antibiotic use.  Past Medical History  Diagnosis Date  . CHF (congestive heart failure)   . Atrial fibrillation   . HTN (hypertension)   . Pacemaker     [employer]   . Arthritis     incl spinal stenosis  . Renal insufficiency     question degree    Past Surgical History  Procedure Date  . Bilateral shoulder surgery   . Tonsillectomy   . Prosthetic hip - bilaterally   . R knee quadricep tendon repair     History reviewed. No pertinent family history. Social History:  reports that he quit smoking about 40 years ago. His smoking use included Cigarettes. He has never used smokeless tobacco. He reports that he drinks alcohol. He reports that he does not use illicit drugs.  Allergies:  Allergies  Allergen Reactions  . Amiodarone     REACTION: fluid on lungs     (Not in a hospital admission)  Results for orders placed during the hospital encounter of 12/03/11 (from the past 48 hour(s))  CBC     Status:  Abnormal   Collection Time   12/03/11  2:13 PM      Component Value Range Comment   WBC 20.5 (*) 4.0 - 10.5 (K/uL)    RBC 3.36 (*) 4.22 - 5.81 (MIL/uL)    Hemoglobin 10.6 (*) 13.0 - 17.0 (g/dL)    HCT 09.8 (*) 11.9 - 52.0 (%)    MCV 94.6  78.0 - 100.0 (fL)    MCH 31.5  26.0 - 34.0 (pg)    MCHC 33.3  30.0 - 36.0 (g/dL)    RDW 14.7  82.9 - 56.2 (%)    Platelets 136 (*) 150 - 400 (K/uL)   DIFFERENTIAL     Status: Abnormal   Collection Time   12/03/11  2:13 PM      Component Value Range Comment   Neutrophils Relative 96 (*) 43 - 77 (%)    Neutro Abs 19.7 (*) 1.7 - 7.7 (K/uL)    Lymphocytes Relative 1 (*) 12 - 46 (%)    Lymphs Abs 0.2 (*) 0.7 - 4.0 (K/uL)    Monocytes Relative 3  3 - 12 (%)    Monocytes Absolute 0.5  0.1 - 1.0 (K/uL)    Eosinophils Relative 0  0 - 5 (%)    Eosinophils Absolute 0.0  0.0 - 0.7 (K/uL)    Basophils Relative 0  0 - 1 (%)    Basophils Absolute 0.0  0.0 - 0.1 (K/uL)   BASIC METABOLIC PANEL     Status: Abnormal   Collection Time   12/03/11  2:13 PM      Component Value Range Comment   Sodium 136  135 - 145 (mEq/L)    Potassium 3.3 (*) 3.5 - 5.1 (mEq/L)    Chloride 99  96 - 112 (mEq/L)    CO2 22  19 - 32 (mEq/L)    Glucose, Bld 190 (*) 70 - 99 (mg/dL)    BUN 27 (*) 6 - 23 (mg/dL)    Creatinine, Ser 3.61 (*) 0.50 - 1.35 (mg/dL)    Calcium 8.8  8.4 - 10.5 (mg/dL)    GFR calc non Af Amer 39 (*) >90 (mL/min)    GFR calc Af Amer 45 (*) >90 (mL/min)   URINALYSIS, ROUTINE W REFLEX MICROSCOPIC     Status: Abnormal   Collection Time   12/03/11  3:19 PM      Component Value Range Comment   Color, Urine YELLOW  YELLOW     APPearance CLEAR  CLEAR     Specific Gravity, Urine 1.018  1.005 - 1.030     pH 6.0  5.0 - 8.0     Glucose, UA NEGATIVE  NEGATIVE (mg/dL)    Hgb urine dipstick SMALL (*) NEGATIVE     Bilirubin Urine NEGATIVE  NEGATIVE     Ketones, ur NEGATIVE  NEGATIVE (mg/dL)    Protein, ur 30 (*) NEGATIVE (mg/dL)    Urobilinogen, UA 0.2  0.0 - 1.0 (mg/dL)     Nitrite NEGATIVE  NEGATIVE     Leukocytes, UA NEGATIVE  NEGATIVE    URINE MICROSCOPIC-ADD ON     Status: Abnormal   Collection Time   12/03/11  3:19 PM      Component Value Range Comment   RBC / HPF 3-6  <3 (RBC/hpf)    Bacteria, UA MANY (*) RARE     Dg Chest 2 View  12/03/2011  *RADIOLOGY REPORT*  Clinical Data: Fall.  Right anterior chest pain.  CHEST - 2 VIEW 12/03/2011:  Comparison: Right rib x-rays obtained concurrently.  Portable chest x-ray 10/13/2011.  Findings: Cardiac silhouette mildly enlarged but stable.  Thoracic aorta atherosclerotic, unchanged.  Hilar and mediastinal contours otherwise unremarkable.  Suboptimal inspiration accounts for mild linear atelectasis in the lung bases.  Lungs otherwise clear.  Mild pulmonary venous hypertension, increased in the interval, without overt pulmonary edema.  Dual lead transvenous pacemaker unchanged in intact.  Degenerative changes and DISH involving the thoracic spine.  IMPRESSION: Suboptimal inspiration accounts for mild atelectasis in the lung bases.  No acute cardiopulmonary disease otherwise.  Stable cardiomegaly.  Pulmonary venous hypertension without overt edema.  Original Report Authenticated By: Arnell Sieving, M.D.   Dg Ribs Unilateral Right  12/03/2011  *RADIOLOGY REPORT*  Clinical Data: Larey Seat and injured right ribs.  Right anterior chest pain.  RIGHT RIBS - 2 VIEW 12/03/2011::  Comparison: No prior right rib imaging.  Two-view chest x-ray same date is correlated.  Findings: No right rib fractures identified.  No intrinsic osseous abnormalities involving the right ribs.  Prominent costal cartilage calcification.  IMPRESSION: No right rib fracture identified.  Original Report Authenticated By: Arnell Sieving, M.D.    Review of Systems  Constitutional: Positive for fever and chills.  Respiratory: Positive for cough. Negative for hemoptysis, shortness of breath and wheezing.   Cardiovascular: Positive for chest pain. Negative for  palpitations, orthopnea and claudication.  Gastrointestinal: Negative for nausea, vomiting and abdominal pain.  Genitourinary: Negative for dysuria and urgency.  Musculoskeletal: Positive for joint pain.  Neurological: Negative for dizziness  and headaches.    Blood pressure 102/54, pulse 71, temperature 100.5 F (38.1 C), temperature source Oral, resp. rate 26, SpO2 98.00%. Physical Exam  Constitutional: He is oriented to person, place, and time.  HENT:  Head: Normocephalic and atraumatic.  Nose: Nose normal.  Mouth/Throat: No oropharyngeal exudate.  Eyes: Conjunctivae are normal. Left eye exhibits no discharge. No scleral icterus.  Neck: Normal range of motion. No JVD present. No thyromegaly present.  Cardiovascular:       Irregularly irregular S1-S2 soft there was soft systolic murmur no S3 gallop  Respiratory:       Decreased breath sound at bases with occasional rhonchi  GI: Soft. Bowel sounds are normal. He exhibits no distension. There is no tenderness. There is no rebound.  Musculoskeletal: He exhibits no edema and no tenderness.  Lymphadenopathy:    He has no cervical adenopathy.  Neurological: He is alert and oriented to person, place, and time.     Assessment/Plan Marked leukocytosis rule out pneumonia Status post fall Hypertension Chronic atrial fibrillation History of right cerebral peduncle/midbrain/subarachnoid hemorrhage in the past Nonischemic cardiomyopathy Hypercholesteremia Chronic kidney disease stage III Anemia of chronic disease Sick sinus syndrome status post permanent pacemaker Degenerative joint disease Plan As per orders Plan  Tequita Marrs N 12/03/2011, 5:42 PM

## 2011-12-03 NOTE — Progress Notes (Signed)
CM reviewed [facility] with pt He confirms he lives in a retirement home without a support system but there are staff of the retirement center that checks on him.  He has been to outpatient PT services before and states he agrees PT would be needed. Pt discussed [facility] but wants to decide on 12/04/11

## 2011-12-03 NOTE — ED Notes (Signed)
EMS brings pt [facility], pt co of diarrhea earlier was in shower and fell while getting out of shower, pt co neck and back pain. No obvious deformity. Pt arrived with c- collar and KED in place.

## 2011-12-03 NOTE — ED Provider Notes (Addendum)
History     CSN: [id number]  Arrival date & time 12/03/11  1141   First MD Initiated Contact with Patient 12/03/11 1351      Chief Complaint  Patient presents with  . Fall    (Consider location/radiation/quality/duration/timing/severity/associated sxs/prior treatment) HPI Comments: [name] is a 78 y.o. Male , who fell in the shower, we'll attempt to get out; causing pain in his neck and upper back. He had had diarrhea earlier. There's been no nausea, vomiting, fever, or chills. He was treated seen by EMS to immobilized, and transferred him here. He's not tried a medication for the problem. The pain is worse in his neck and back when he moves.  The history is provided by the patient.    Past Medical History  Diagnosis Date  . CHF (congestive heart failure)   . Atrial fibrillation   . HTN (hypertension)   . Pacemaker     [employer]   . Arthritis     incl spinal stenosis  . Renal insufficiency     question degree    Past Surgical History  Procedure Date  . Bilateral shoulder surgery   . Tonsillectomy   . Prosthetic hip - bilaterally   . R knee quadricep tendon repair     No family history on file.  History  Substance Use Topics  . Smoking status: Former Smoker    Types: Cigarettes    Quit date: 05/13/1971  . Smokeless tobacco: Never Used  . Alcohol Use: Yes     occasional      Review of Systems  All other systems reviewed and are negative.    Allergies  Amiodarone  Home Medications   Current Outpatient Rx  Name Route Sig Dispense Refill  . ACETAMINOPHEN 325 MG PO TABS Oral Take 650 mg by mouth 2 (two) times daily.    . B COMPLEX-C PO TABS Oral Take 1 tablet by mouth every morning.     Marland Kitchen CARVEDILOL 3.125 MG PO TABS Oral Take 3.125-6.25 mg by mouth See admin instructions. He takes as needed if his blood pressure is >140 he takes two tablets, if it is < 100 he takes one tablet and if it is 120 he does not take any tablets.    Marland Kitchen DIGOXIN 0.125  MG PO TABS Oral Take 125 mcg by mouth at bedtime.    Marland Kitchen FINASTERIDE 5 MG PO TABS Oral Take 5 mg by mouth every morning.     Marland Kitchen FOLIC ACID 400 MCG PO TABS Oral Take 400 mcg by mouth every morning.    Marland Kitchen LISINOPRIL 10 MG PO TABS Oral Take 10 mg by mouth at bedtime.    . ADULT MULTIVITAMIN W/MINERALS CH Oral Take 1 tablet by mouth 2 (two) times daily.     Marland Kitchen FISH OIL 1000 MG PO CAPS Oral Take 2 capsules by mouth 2 (two) times daily.     Marland Kitchen PRAVASTATIN SODIUM 20 MG PO TABS Oral Take 20 mg by mouth at bedtime.      . TAMSULOSIN HCL 0.4 MG PO CAPS Oral Take 0.8 mg by mouth every morning.     Marland Kitchen VITAMIN B-1 100 MG PO TABS Oral Take 100 mg by mouth daily.      Marland Kitchen ZOLPIDEM TARTRATE 10 MG PO TABS Oral Take 10 mg by mouth at bedtime.      BP 112/62  Pulse 80  Temp 97 F (36.1 C)  SpO2 90%  Physical Exam  Nursing note and vitals  reviewed. Constitutional: He is oriented to person, place, and time. He appears well-developed.       He is frail  HENT:  Head: Normocephalic and atraumatic.  Right Ear: External ear normal.  Left Ear: External ear normal.  Eyes: Conjunctivae and EOM are normal. Pupils are equal, round, and reactive to light.  Neck: Normal range of motion and phonation normal. Neck supple.  Cardiovascular: Normal rate, regular rhythm, normal heart sounds and intact distal pulses.   Pulmonary/Chest: Effort normal and breath sounds normal. He exhibits no bony tenderness.       Right posterior chest wall is tender, without crepitation.  Abdominal: Soft. Normal appearance. There is no tenderness.  Musculoskeletal: Normal range of motion.       Cervical thoracic, lumbar spines are nontender.  Neurological: He is alert and oriented to person, place, and time. He has normal strength. No cranial nerve deficit or sensory deficit. He exhibits normal muscle tone. Coordination normal.  Skin: Skin is warm, dry and intact.  Psychiatric: His behavior is normal.    ED Course  Procedures (including  critical care time)  Emergency department treatment IV fluids, bolus and drip. Case discussed with Dr.Harwani, will admit, the patient     Labs Reviewed  CBC - Abnormal; Notable for the following:    WBC 20.5 (*)    RBC 3.36 (*)    Hemoglobin 10.6 (*)    HCT 31.8 (*)    Platelets 136 (*)    All other components within normal limits  DIFFERENTIAL - Abnormal; Notable for the following:    Neutrophils Relative 96 (*)    Neutro Abs 19.7 (*)    Lymphocytes Relative 1 (*)    Lymphs Abs 0.2 (*)    All other components within normal limits  BASIC METABOLIC PANEL - Abnormal; Notable for the following:    Potassium 3.3 (*)    Glucose, Bld 190 (*)    BUN 27 (*)    Creatinine, Ser 1.58 (*)    GFR calc non Af Amer 39 (*)    GFR calc Af Amer 45 (*)    All other components within normal limits  URINALYSIS, ROUTINE W REFLEX MICROSCOPIC  URINE CULTURE   Dg Chest 2 View  12/03/2011  *RADIOLOGY REPORT*  Clinical Data: Fall.  Right anterior chest pain.  CHEST - 2 VIEW 12/03/2011:  Comparison: Right rib x-rays obtained concurrently.  Portable chest x-ray 10/13/2011.  Findings: Cardiac silhouette mildly enlarged but stable.  Thoracic aorta atherosclerotic, unchanged.  Hilar and mediastinal contours otherwise unremarkable.  Suboptimal inspiration accounts for mild linear atelectasis in the lung bases.  Lungs otherwise clear.  Mild pulmonary venous hypertension, increased in the interval, without overt pulmonary edema.  Dual lead transvenous pacemaker unchanged in intact.  Degenerative changes and DISH involving the thoracic spine.  IMPRESSION: Suboptimal inspiration accounts for mild atelectasis in the lung bases.  No acute cardiopulmonary disease otherwise.  Stable cardiomegaly.  Pulmonary venous hypertension without overt edema.  Original Report Authenticated By: Arnell Sieving, M.D.   Dg Ribs Unilateral Right  12/03/2011  *RADIOLOGY REPORT*  Clinical Data: Larey Seat and injured right ribs.  Right  anterior chest pain.  RIGHT RIBS - 2 VIEW 12/03/2011::  Comparison: No prior right rib imaging.  Two-view chest x-ray same date is correlated.  Findings: No right rib fractures identified.  No intrinsic osseous abnormalities involving the right ribs.  Prominent costal cartilage calcification.  IMPRESSION: No right rib fracture identified.  Original Report Authenticated By:  Arnell Sieving, M.D.     1. Dehydration   2. Fall   3. Contusion, chest wall       MDM  Fall with dehydration, and elevated white blood cell count. Mild renal insufficiency, worsened baseline. Possible UTI versus occult pneumonia. He needs admission for stabilization    Plan- Admit, Holding orders written    Flint Melter, MD 12/03/11 1610  Flint Melter, MD 01/30/12 [phone]

## 2011-12-03 NOTE — ED Notes (Signed)
Bed:[facility]ALA<BR> Expected date:<BR> Expected time:11:37 AM<BR> Means of arrival:<BR> Comments:<BR> M40 - 85yoM Fall, neck/back: LSB

## 2011-12-04 ENCOUNTER — Inpatient Hospital Stay (HOSPITAL_COMMUNITY): Payer: Medicare Other

## 2011-12-04 DIAGNOSIS — G459 Transient cerebral ischemic attack, unspecified: Secondary | ICD-10-CM

## 2011-12-04 DIAGNOSIS — R4789 Other speech disturbances: Secondary | ICD-10-CM

## 2011-12-04 LAB — EXPECTORATED SPUTUM ASSESSMENT W GRAM STAIN, RFLX TO RESP C

## 2011-12-04 LAB — CBC
HCT: 29.6 % — ABNORMAL LOW (ref 39.0–52.0)
Hemoglobin: 10 g/dL — ABNORMAL LOW (ref 13.0–17.0)
MCH: 31.7 pg (ref 26.0–34.0)
MCHC: 33.8 g/dL (ref 30.0–36.0)
MCV: 94 fL (ref 78.0–100.0)
Platelets: 100 10*3/uL — ABNORMAL LOW (ref 150–400)
RBC: 3.15 MIL/uL — ABNORMAL LOW (ref 4.22–5.81)
RDW: 13.9 % (ref 11.5–15.5)
WBC: 15.2 10*3/uL — ABNORMAL HIGH (ref 4.0–10.5)

## 2011-12-04 LAB — LEGIONELLA ANTIGEN, URINE: Legionella Antigen, Urine: NEGATIVE

## 2011-12-04 LAB — TSH: TSH: 1.813 u[IU]/mL (ref 0.350–4.500)

## 2011-12-04 LAB — BASIC METABOLIC PANEL
BUN: 26 mg/dL — ABNORMAL HIGH (ref 6–23)
CO2: 20 mEq/L (ref 19–32)
Calcium: 8.2 mg/dL — ABNORMAL LOW (ref 8.4–10.5)
Chloride: 99 mEq/L (ref 96–112)
Creatinine, Ser: 1.37 mg/dL — ABNORMAL HIGH (ref 0.50–1.35)
GFR calc Af Amer: 53 mL/min — ABNORMAL LOW (ref >90)
GFR calc non Af Amer: 46 mL/min — ABNORMAL LOW (ref >90)
Glucose, Bld: 171 mg/dL — ABNORMAL HIGH (ref 70–99)
Potassium: 3.2 mEq/L — ABNORMAL LOW (ref 3.5–5.1)
Sodium: 133 mEq/L — ABNORMAL LOW (ref 135–145)

## 2011-12-04 LAB — HIV ANTIBODY (ROUTINE TESTING W REFLEX): HIV: NONREACTIVE

## 2011-12-04 LAB — MRSA PCR SCREENING: MRSA by PCR: NEGATIVE

## 2011-12-04 LAB — STREP PNEUMONIAE URINARY ANTIGEN: Strep Pneumo Urinary Antigen: NEGATIVE

## 2011-12-04 MED ORDER — TRAMADOL HCL 50 MG PO TABS
50.0000 mg | ORAL_TABLET | Freq: Three times a day (TID) | ORAL | Status: DC | PRN
  Administered 2011-12-04 – 2011-12-06 (×6): 50 mg via ORAL
  Filled 2011-12-04 (×6): qty 1

## 2011-12-04 MED ORDER — HEPARIN SODIUM (PORCINE) 5000 UNIT/ML IJ SOLN
5000.0000 [IU] | Freq: Three times a day (TID) | INTRAMUSCULAR | Status: DC
  Administered 2011-12-04 – 2011-12-06 (×8): 5000 [IU] via SUBCUTANEOUS
  Filled 2011-12-04 (×12): qty 1

## 2011-12-04 MED ORDER — VANCOMYCIN HCL 1000 MG IV SOLR
750.0000 mg | Freq: Two times a day (BID) | INTRAVENOUS | Status: DC
  Administered 2011-12-04 – 2011-12-08 (×8): 750 mg via INTRAVENOUS
  Filled 2011-12-04 (×9): qty 750

## 2011-12-04 MED ORDER — ASPIRIN 325 MG PO TABS
325.0000 mg | ORAL_TABLET | Freq: Every day | ORAL | Status: DC
  Administered 2011-12-04 – 2011-12-12 (×9): 325 mg via ORAL
  Filled 2011-12-04 (×9): qty 1

## 2011-12-04 MED ORDER — LISINOPRIL 5 MG PO TABS
5.0000 mg | ORAL_TABLET | Freq: Every day | ORAL | Status: DC
  Administered 2011-12-04: 5 mg via ORAL
  Filled 2011-12-04 (×2): qty 1

## 2011-12-04 MED ORDER — CARVEDILOL 3.125 MG PO TABS
3.1250 mg | ORAL_TABLET | Freq: Two times a day (BID) | ORAL | Status: DC
  Administered 2011-12-04 – 2011-12-12 (×14): 3.125 mg via ORAL
  Filled 2011-12-04 (×18): qty 1

## 2011-12-04 MED ORDER — POTASSIUM CHLORIDE CRYS ER 20 MEQ PO TBCR
40.0000 meq | EXTENDED_RELEASE_TABLET | Freq: Once | ORAL | Status: AC
  Administered 2011-12-04: 40 meq via ORAL
  Filled 2011-12-04 (×3): qty 2

## 2011-12-04 NOTE — Progress Notes (Signed)
Dr Sharyn Lull paged concerning patient having increase respirations on 36 bpm and temp of 103.9. Also notified of patient complaining of pain in left wrist. New orders received and initated

## 2011-12-04 NOTE — Progress Notes (Signed)
Subjective:  Patient drowsy and sleepy complains of left wrist pain Denies chest pain or shortness of breath. No further episodes of difficulty with speech. CT of the brain showed lacunar infarct with no evidence of new infarct or bleed. Objective:  Vital Signs in the last 24 hours: Temp:  [97 F (36.1 C)-103.9 F (39.9 C)] 98 F (36.7 C) (05/09 0840) Pulse Rate:  [71-89] 82  (05/09 0624) Resp:  [15-36] 15  (05/09 0624) BP: (92-153)/(50-73) 92/52 mmHg (05/09 0624) SpO2:  [90 %-100 %] 98 % (05/09 0624) Weight:  [75.6 kg (166 lb 10.7 oz)] 75.6 kg (166 lb 10.7 oz) (05/09 0714)  Intake/Output from previous day: 05/08 0701 - 05/09 0700 In: 1020 [P.O.:720; IV Piggyback:300] Out: 690 [Urine:690] Intake/Output from this shift:    Physical Exam: Neck: no adenopathy, no carotid bruit, no JVD and supple, symmetrical, trachea midline Lungs: Decreased breath sound at bases with occasional rhonchi Heart: irregularly irregular rhythm, S1, S2 normal and Soft systolic murmur noted Abdomen: soft, non-tender; bowel sounds normal; no masses,  no organomegaly Extremities: No clubbing cyanosis or edema left wrist swelling noted  Lab Results:  Basename 12/04/11 0438 12/03/11 2040  WBC 15.2* 18.7*  HGB 10.0* 10.5*  PLT 100* 116*    Basename 12/04/11 0438 12/03/11 2040  NA 133* 134*  K 3.2* 3.4*  CL 99 98  CO2 20 22  GLUCOSE 171* 158*  BUN 26* 27*  CREATININE 1.37* 1.49*   No results found for this basename: TROPONINI:2,CK,MB:2 in the last 72 hours Hepatic Function Panel  Basename 12/03/11 2040  PROT 6.6  ALBUMIN 2.9*  AST 35  ALT 19  ALKPHOS 62  BILITOT 0.6  BILIDIR --  IBILI --   No results found for this basename: CHOL in the last 72 hours No results found for this basename: PROTIME in the last 72 hours  Imaging: Imaging results have been reviewed and Dg Chest 2 View  12/03/2011  *RADIOLOGY REPORT*  Clinical Data: Fall.  Right anterior chest pain.  CHEST - 2 VIEW 12/03/2011:   Comparison: Right rib x-rays obtained concurrently.  Portable chest x-ray 10/13/2011.  Findings: Cardiac silhouette mildly enlarged but stable.  Thoracic aorta atherosclerotic, unchanged.  Hilar and mediastinal contours otherwise unremarkable.  Suboptimal inspiration accounts for mild linear atelectasis in the lung bases.  Lungs otherwise clear.  Mild pulmonary venous hypertension, increased in the interval, without overt pulmonary edema.  Dual lead transvenous pacemaker unchanged in intact.  Degenerative changes and DISH involving the thoracic spine.  IMPRESSION: Suboptimal inspiration accounts for mild atelectasis in the lung bases.  No acute cardiopulmonary disease otherwise.  Stable cardiomegaly.  Pulmonary venous hypertension without overt edema.  Original Report Authenticated By: Arnell Sieving, M.D.   Dg Ribs Unilateral Right  12/03/2011  *RADIOLOGY REPORT*  Clinical Data: Larey Seat and injured right ribs.  Right anterior chest pain.  RIGHT RIBS - 2 VIEW 12/03/2011::  Comparison: No prior right rib imaging.  Two-view chest x-ray same date is correlated.  Findings: No right rib fractures identified.  No intrinsic osseous abnormalities involving the right ribs.  Prominent costal cartilage calcification.  IMPRESSION: No right rib fracture identified.  Original Report Authenticated By: Arnell Sieving, M.D.   Dg Hip Bilateral Vito Berger  12/03/2011  *RADIOLOGY REPORT*  Clinical Data: Fall with bilateral hip pain.  BILATERAL HIP WITH PELVIS - 4+ VIEW  Comparison: None.  Findings: Frontal view the pelvis shows diffuse bony demineralization.  The patient is status post left total hip  arthroplasty.  There is a bipolar hemiarthroplasty on the right. No evidence for pelvic fracture.  No periprosthetic femur fracture is seen on a the sign.  Although we do not have a cross-table lateral view of the hips, the femoral components appear located.  IMPRESSION: No acute bony findings.  Original Report Authenticated By:  ERIC A. MANSELL, M.D.   Ct Head Wo Contrast  12/03/2011  *RADIOLOGY REPORT*  Clinical Data: New onset of aphasia; assess for CVA.  CT HEAD WITHOUT CONTRAST  Technique:  Contiguous axial images were obtained from the base of the skull through the vertex without contrast.  Comparison: CT of the head performed 10/19/2011  Findings: There is no evidence of acute infarction, mass lesion, or intra- or extra-axial hemorrhage on CT.  Increased density anterior to the brainstem is thought to reflect beam hardening artifact. Evaluation is suboptimal due to motion artifact and machine artifact.  Chronic lacunar infarcts are seen within the right thalamus, right cerebral peduncle and left basal ganglia.  The posterior fossa, including the cerebellum, brainstem and fourth ventricle, is within normal limits.  The third and lateral ventricles are unremarkable in appearance.  The cerebral hemispheres demonstrate grossly normal gray-white differentiation. No mass effect or midline shift is seen.  There is no evidence of fracture; visualized osseous structures are unremarkable in appearance.  The orbits are within normal limits. There is mild partial opacification of the right mastoid air cells. The paranasal sinuses and left mastoid air cells are well-aerated. No significant soft tissue abnormalities are seen.  IMPRESSION:  1.  No acute intracranial pathology seen on CT. 2.  Chronic lacunar infarcts within the right thalamus, right cerebral peduncle and left basal ganglia. 3.  Mild partial opacification of the right mastoid air cells.  These results were called by telephone on 12/03/2011  at  09:25 p.m. to  Nursing on [facility], who verbally acknowledged these results.  Original Report Authenticated By: Tonia Ghent, M.D.    Cardiac Studies:  Assessment/Plan:  Status post probable TIA Probable pneumonia Leukocytosis improved Status post fall rule out left wrist fracture Chronic atrial fibrillation History of right  cerebral peduncle/midbrain/subarachnoid hemorrhage in the past Ischemic cardiomyopathy Hypercholesteremia Chronic kidney disease stage III improved Anemia of chronic disease 6 sinus syndrome status post permanent pacemaker Cerebrovascular disease Degenerative joint disease Hypokalemia Plan Agree with aspirin Will hold off to repeat 2-D echo as patient recently had the 2-D echo. Patient is not a candidate for chronic anticoagulation in view of recurrent falls and recent history of traumatic brain hemorrhage. Check labs in a.m. Heparin for DVT prophylaxis Replace K.  LOS: 1 day    Darius Lundberg N 12/04/2011, 9:56 AM

## 2011-12-04 NOTE — Progress Notes (Signed)
   CARE MANAGEMENT NOTE 12/04/2011  Patient:  [name], [name]   Account Number:  [id number]  Date Initiated:  12/04/2011  Documentation initiated by:  Jiles Crocker  Subjective/Objective Assessment:   ADMITTED WITH BACK PAIN/ FEVER     Action/Plan:   LIVES ALONE; AWAITING FOR PT/OT EVALS FOR POSSIBLE PLACEMENT   Anticipated DC Date:  12/11/2011   Anticipated DC Plan:  SKILLED NURSING FACILITY  In-house referral  Clinical Social Worker      DC Planning Services  CM consult               Status of service:  In process, will continue to follow Medicare Important Message given?  NA - LOS <3 / Initial given by admissions (If response is "NO", the following Medicare IM given date fields will be blank)  Per UR Regulation:  Reviewed for med. necessity/level of care/duration of stay  Comments:  12/04/2011- B Garyn Waguespack RN, BSN, MHA

## 2011-12-04 NOTE — Significant Event (Signed)
Rapid Response Event Note  Overview: Time Called: 2020 Arrival Time: 2024 Event Type: Neurologic  Initial Focused Assessment: Pt having some expressive aphasia, not previously reported. Hx: syncope/falls, subarachnoid hemorrhage. Pt alert, oriented to person/place and answers birthdate as "[birth year]" instead of [birth year] and could not recall year. PEARRLA 2+. VSS on 2L [place]. CTA. Pt paced/AF HR 70s. No pronator drift. Able to move all extremities equally to command.   Interventions: CBG: 151. CT head completed and pt moved to tele to monitor AF (hx: sick sinus). Neuro consult previously placed.  Event Summary: Name of Physician Notified: Harwani at 2020    at    Outcome: Transferred (Comment) [phone])     [name], [name]

## 2011-12-04 NOTE — Consult Note (Signed)
Reason for Consult:TIA Referring Physician: Sharyn Lull  CC: Difficulty with speech  HPI: [name] is an 78 y.o. 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  After admission had an episode of difficulty with speech.  Per report of the consulting physician the patient was having difficulty getting his words out.  Episode resolved without intervention.  Head CT was performed which showed chronic lacunar infarcts with no acute changes.     Past Medical History  Diagnosis Date  . CHF (congestive heart failure)   . Atrial fibrillation   . HTN (hypertension)   . Pacemaker     [employer]   . Arthritis     incl spinal stenosis  . Renal insufficiency     question degree    Past Surgical History  Procedure Date  . Bilateral shoulder surgery   . Tonsillectomy   . Prosthetic hip - bilaterally   . R knee quadricep tendon repair     History reviewed. No pertinent family history.  Social History:  reports that he quit smoking about 40 years ago. His smoking use included Cigarettes. He has never used smokeless tobacco. He reports that he drinks alcohol. He reports that he does not use illicit drugs.  Allergies  Allergen Reactions  . Amiodarone     REACTION: fluid on lungs    Medications:  I have reviewed the patient's current medications. Prior to Admission:  Prescriptions prior to admission  Medication Sig Dispense Refill  . acetaminophen (TYLENOL) 325 MG tablet Take 650 mg by mouth 2 (two) times daily.      . B Complex-C (B-COMPLEX WITH VITAMIN C) tablet Take 1 tablet by mouth every morning.       . carvedilol (COREG) 3.125 MG tablet Take 3.125-6.25 mg by mouth See admin instructions. He takes as needed if his blood pressure is >140 he takes two tablets, if it is < 100 he takes one tablet and if it is 120 he  does not take any tablets.      . digoxin (LANOXIN) 0.125 MG tablet Take 125 mcg by mouth at bedtime.      . finasteride (PROSCAR) 5 MG tablet Take 5 mg by mouth every morning.       . folic acid (FOLVITE) 400 MCG tablet Take 400 mcg by mouth every morning.      Marland Kitchen lisinopril (PRINIVIL,ZESTRIL) 10 MG tablet Take 10 mg by mouth at bedtime.      . Multiple Vitamin (MULITIVITAMIN WITH MINERALS) TABS Take 1 tablet by mouth 2 (two) times daily.       . Omega-3 Fatty Acids (FISH OIL) 1000 MG CAPS Take 2 capsules by mouth 2 (two) times daily.       . pravastatin (PRAVACHOL) 20 MG tablet Take 20 mg by mouth at bedtime.        . Tamsulosin HCl (FLOMAX) 0.4 MG CAPS Take 0.8 mg by mouth every morning.       . Thiamine HCl (VITAMIN B-1) 100 MG tablet Take 100 mg by mouth daily.        Marland Kitchen zolpidem (AMBIEN) 10 MG tablet Take 10 mg by mouth at bedtime.       Scheduled:   . azithromycin  500 mg Intravenous Q24H  . B-complex with vitamin C  1 tablet Oral q morning - 10a  . carvedilol  6.25 mg  Oral BID AC  . cefTRIAXone (ROCEPHIN)  IV  1 g Intravenous Q24H  . digoxin  125 mcg Oral QHS  . finasteride  5 mg Oral q morning - 10a  . folic acid  1 mg Oral Daily  . lisinopril  10 mg Oral QHS  . mulitivitamin with minerals  1 tablet Oral BID  . omega-3 acid ethyl esters  1 g Oral Daily  . simvastatin  10 mg Oral q1800  . sodium chloride  500 mL Intravenous Once  . Tamsulosin HCl  0.8 mg Oral q morning - 10a  . thiamine  100 mg Oral Daily  . DISCONTD: folic acid  400 mcg Oral q morning - 10a    ROS: History obtained from the patient  General ROS: fever Psychological ROS: negative for - behavioral disorder, hallucinations, memory difficulties, mood swings or suicidal ideation Ophthalmic ROS: negative for - blurry vision, double vision, eye pain or loss of vision ENT ROS: negative for - epistaxis, nasal discharge, oral lesions, sore throat, tinnitus or vertigo Allergy and Immunology ROS: negative for - hives  or itchy/watery eyes Hematological and Lymphatic ROS: negative for - bleeding problems, bruising or swollen lymph nodes Endocrine ROS: negative for - galactorrhea, hair pattern changes, polydipsia/polyuria or temperature intolerance Respiratory ROS:  cough Cardiovascular ROS: negative for - chest pain, dyspnea on exertion, edema or irregular heartbeat Gastrointestinal ROS: negative for - abdominal pain, diarrhea, hematemesis, nausea/vomiting or stool incontinence Genito-Urinary ROS: negative for - dysuria, hematuria, incontinence or urinary frequency/urgency Musculoskeletal ROS: negative for - joint swelling or muscular weakness Neurological ROS: as noted in HPI Dermatological ROS: negative for rash and skin lesion changes  Physical Examination: Blood pressure 147/66, pulse 73, temperature 99.7 F (37.6 C), temperature source Oral, resp. rate 24, SpO2 97.00%.  Neurologic Examination Mental Status: Alert and awake.  Speech fluent without evidence of aphasia.  Able to follow 3 step commands without difficulty. Cranial Nerves: II: visual fields grossly normal, pupils equal, round, reactive to light and accommodation III,IV, VI: ptosis not present, extra-ocular motions intact bilaterally V,VII: smile symmetric, facial light touch sensation decreased on the right VIII: hearing normal bilaterally IX,X: gag reflex present XI: trapezius strength/neck flexion strength normal bilaterally XII: tongue strength normal  Motor: Right : Upper extremity   5/5    Left:     Upper extremity   Limited exam secondary to pain  Lower extremity   5/5     Lower extremity  Limited exam secondary to pain  Tone and bulk:normal tone throughout; significant atrophy noted in the hand intrinsics bilaterally Sensory: Pinprick and light touch intact decreased on the left Deep Tendon Reflexes: 1+ in the upper extremities and and symmetric throughout Plantars: Right: downgoing   Left: downgoing Cerebellar: Not tested  secondary to pain  Results for orders placed during the hospital encounter of 12/03/11 (from the past 48 hour(s))  CBC     Status: Abnormal   Collection Time   12/03/11  2:13 PM      Component Value Range Comment   WBC 20.5 (*) 4.0 - 10.5 (K/uL)    RBC 3.36 (*) 4.22 - 5.81 (MIL/uL)    Hemoglobin 10.6 (*) 13.0 - 17.0 (g/dL)    HCT 32.4 (*) 40.1 - 52.0 (%)    MCV 94.6  78.0 - 100.0 (fL)    MCH 31.5  26.0 - 34.0 (pg)    MCHC 33.3  30.0 - 36.0 (g/dL)    RDW 02.7  25.3 - 66.4 (%)  Platelets 136 (*) 150 - 400 (K/uL)   DIFFERENTIAL     Status: Abnormal   Collection Time   12/03/11  2:13 PM      Component Value Range Comment   Neutrophils Relative 96 (*) 43 - 77 (%)    Neutro Abs 19.7 (*) 1.7 - 7.7 (K/uL)    Lymphocytes Relative 1 (*) 12 - 46 (%)    Lymphs Abs 0.2 (*) 0.7 - 4.0 (K/uL)    Monocytes Relative 3  3 - 12 (%)    Monocytes Absolute 0.5  0.1 - 1.0 (K/uL)    Eosinophils Relative 0  0 - 5 (%)    Eosinophils Absolute 0.0  0.0 - 0.7 (K/uL)    Basophils Relative 0  0 - 1 (%)    Basophils Absolute 0.0  0.0 - 0.1 (K/uL)   BASIC METABOLIC PANEL     Status: Abnormal   Collection Time   12/03/11  2:13 PM      Component Value Range Comment   Sodium 136  135 - 145 (mEq/L)    Potassium 3.3 (*) 3.5 - 5.1 (mEq/L)    Chloride 99  96 - 112 (mEq/L)    CO2 22  19 - 32 (mEq/L)    Glucose, Bld 190 (*) 70 - 99 (mg/dL)    BUN 27 (*) 6 - 23 (mg/dL)    Creatinine, Ser 9.60 (*) 0.50 - 1.35 (mg/dL)    Calcium 8.8  8.4 - 10.5 (mg/dL)    GFR calc non Af Amer 39 (*) >90 (mL/min)    GFR calc Af Amer 45 (*) >90 (mL/min)   URINALYSIS, ROUTINE W REFLEX MICROSCOPIC     Status: Abnormal   Collection Time   12/03/11  3:19 PM      Component Value Range Comment   Color, Urine YELLOW  YELLOW     APPearance CLEAR  CLEAR     Specific Gravity, Urine 1.018  1.005 - 1.030     pH 6.0  5.0 - 8.0     Glucose, UA NEGATIVE  NEGATIVE (mg/dL)    Hgb urine dipstick SMALL (*) NEGATIVE     Bilirubin Urine NEGATIVE   NEGATIVE     Ketones, ur NEGATIVE  NEGATIVE (mg/dL)    Protein, ur 30 (*) NEGATIVE (mg/dL)    Urobilinogen, UA 0.2  0.0 - 1.0 (mg/dL)    Nitrite NEGATIVE  NEGATIVE     Leukocytes, UA NEGATIVE  NEGATIVE    URINE MICROSCOPIC-ADD ON     Status: Abnormal   Collection Time   12/03/11  3:19 PM      Component Value Range Comment   RBC / HPF 3-6  <3 (RBC/hpf)    Bacteria, UA MANY (*) RARE    GLUCOSE, CAPILLARY     Status: Abnormal   Collection Time   12/03/11  8:32 PM      Component Value Range Comment   Glucose-Capillary 151 (*) 70 - 99 (mg/dL)   COMPREHENSIVE METABOLIC PANEL     Status: Abnormal   Collection Time   12/03/11  8:40 PM      Component Value Range Comment   Sodium 134 (*) 135 - 145 (mEq/L)    Potassium 3.4 (*) 3.5 - 5.1 (mEq/L)    Chloride 98  96 - 112 (mEq/L)    CO2 22  19 - 32 (mEq/L)    Glucose, Bld 158 (*) 70 - 99 (mg/dL)    BUN 27 (*) 6 - 23 (mg/dL)  Creatinine, Ser 1.49 (*) 0.50 - 1.35 (mg/dL)    Calcium 8.3 (*) 8.4 - 10.5 (mg/dL)    Total Protein 6.6  6.0 - 8.3 (g/dL)    Albumin 2.9 (*) 3.5 - 5.2 (g/dL)    AST 35  0 - 37 (U/L)    ALT 19  0 - 53 (U/L)    Alkaline Phosphatase 62  39 - 117 (U/L)    Total Bilirubin 0.6  0.3 - 1.2 (mg/dL)    GFR calc non Af Amer 42 (*) >90 (mL/min)    GFR calc Af Amer 48 (*) >90 (mL/min)   MAGNESIUM     Status: Normal   Collection Time   12/03/11  8:40 PM      Component Value Range Comment   Magnesium 1.6  1.5 - 2.5 (mg/dL)   CBC     Status: Abnormal   Collection Time   12/03/11  8:40 PM      Component Value Range Comment   WBC 18.7 (*) 4.0 - 10.5 (K/uL)    RBC 3.29 (*) 4.22 - 5.81 (MIL/uL)    Hemoglobin 10.5 (*) 13.0 - 17.0 (g/dL)    HCT 16.1 (*) 09.6 - 52.0 (%)    MCV 94.5  78.0 - 100.0 (fL)    MCH 31.9  26.0 - 34.0 (pg)    MCHC 33.8  30.0 - 36.0 (g/dL)    RDW 04.5  40.9 - 81.1 (%)    Platelets 116 (*) 150 - 400 (K/uL)   DIFFERENTIAL     Status: Abnormal   Collection Time   12/03/11  8:40 PM      Component Value Range Comment    Neutrophils Relative 95 (*) 43 - 77 (%)    Lymphocytes Relative 3 (*) 12 - 46 (%)    Monocytes Relative 2 (*) 3 - 12 (%)    Eosinophils Relative 0  0 - 5 (%)    Basophils Relative 0  0 - 1 (%)    Neutro Abs 17.7 (*) 1.7 - 7.7 (K/uL)    Lymphs Abs 0.6 (*) 0.7 - 4.0 (K/uL)    Monocytes Absolute 0.4  0.1 - 1.0 (K/uL)    Eosinophils Absolute 0.0  0.0 - 0.7 (K/uL)    Basophils Absolute 0.0  0.0 - 0.1 (K/uL)    WBC Morphology INCREASED BANDS (>20% BANDS)      Smear Review PLATELET COUNT CONFIRMED BY SMEAR       No results found for this or any previous visit (from the past 240 hour(s)).  Dg Chest 2 View  12/03/2011  *RADIOLOGY REPORT*  Clinical Data: Fall.  Right anterior chest pain.  CHEST - 2 VIEW 12/03/2011:  Comparison: Right rib x-rays obtained concurrently.  Portable chest x-ray 10/13/2011.  Findings: Cardiac silhouette mildly enlarged but stable.  Thoracic aorta atherosclerotic, unchanged.  Hilar and mediastinal contours otherwise unremarkable.  Suboptimal inspiration accounts for mild linear atelectasis in the lung bases.  Lungs otherwise clear.  Mild pulmonary venous hypertension, increased in the interval, without overt pulmonary edema.  Dual lead transvenous pacemaker unchanged in intact.  Degenerative changes and DISH involving the thoracic spine.  IMPRESSION: Suboptimal inspiration accounts for mild atelectasis in the lung bases.  No acute cardiopulmonary disease otherwise.  Stable cardiomegaly.  Pulmonary venous hypertension without overt edema.  Original Report Authenticated By: Arnell Sieving, M.D.   Dg Ribs Unilateral Right  12/03/2011  *RADIOLOGY REPORT*  Clinical Data: Larey Seat and injured right ribs.  Right anterior chest  pain.  RIGHT RIBS - 2 VIEW 12/03/2011::  Comparison: No prior right rib imaging.  Two-view chest x-ray same date is correlated.  Findings: No right rib fractures identified.  No intrinsic osseous abnormalities involving the right ribs.  Prominent costal cartilage  calcification.  IMPRESSION: No right rib fracture identified.  Original Report Authenticated By: Arnell Sieving, M.D.   Dg Hip Bilateral Vito Berger  12/03/2011  *RADIOLOGY REPORT*  Clinical Data: Fall with bilateral hip pain.  BILATERAL HIP WITH PELVIS - 4+ VIEW  Comparison: None.  Findings: Frontal view the pelvis shows diffuse bony demineralization.  The patient is status post left total hip arthroplasty.  There is a bipolar hemiarthroplasty on the right. No evidence for pelvic fracture.  No periprosthetic femur fracture is seen on a the sign.  Although we do not have a cross-table lateral view of the hips, the femoral components appear located.  IMPRESSION: No acute bony findings.  Original Report Authenticated By: ERIC A. MANSELL, M.D.   Ct Head Wo Contrast  12/03/2011  *RADIOLOGY REPORT*  Clinical Data: New onset of aphasia; assess for CVA.  CT HEAD WITHOUT CONTRAST  Technique:  Contiguous axial images were obtained from the base of the skull through the vertex without contrast.  Comparison: CT of the head performed 10/19/2011  Findings: There is no evidence of acute infarction, mass lesion, or intra- or extra-axial hemorrhage on CT.  Increased density anterior to the brainstem is thought to reflect beam hardening artifact. Evaluation is suboptimal due to motion artifact and machine artifact.  Chronic lacunar infarcts are seen within the right thalamus, right cerebral peduncle and left basal ganglia.  The posterior fossa, including the cerebellum, brainstem and fourth ventricle, is within normal limits.  The third and lateral ventricles are unremarkable in appearance.  The cerebral hemispheres demonstrate grossly normal gray-white differentiation. No mass effect or midline shift is seen.  There is no evidence of fracture; visualized osseous structures are unremarkable in appearance.  The orbits are within normal limits. There is mild partial opacification of the right mastoid air cells. The paranasal  sinuses and left mastoid air cells are well-aerated. No significant soft tissue abnormalities are seen.  IMPRESSION:  1.  No acute intracranial pathology seen on CT. 2.  Chronic lacunar infarcts within the right thalamus, right cerebral peduncle and left basal ganglia. 3.  Mild partial opacification of the right mastoid air cells.  These results were called by telephone on 12/03/2011  at  09:25 p.m. to  Nursing on [facility], who verbally acknowledged these results.  Original Report Authenticated By: Tonia Ghent, M.D.     Assessment/Plan:  Patient Active Hospital Problem List: Difficulty with speech, ?TIA   Assessment:  Patient with history of atrial fibrillation and on no anticoagulation secondary to recent ICH/SAH.  Is at risk for an ischemic event.  Unclear if the episode this evening was an ischemic event.  CT was unremarkable.  MRI unable to be performed secondary to pacemaker. Carotid dopplers and echo were performed in March and would not repeat again at this point.  Carotid dopplers show a 40-59% RICA stenosis and a 60-79% LICA stenosis.  Echo showed no evidence of intracardiac masses or thrombi.        Plan:  1.  Would not repeat stroke work up at this time  2.  Start ASA 325mg  daily  3.  If episodes continue would consider EEG  4.  Tele monitoring  5.  Frequent neuro checks  6.  Would have left ICA stenosis followed up on an outpatient basis.     Thana Farr, MD [facility] [phone] 12/04/2011, 12:35 AM

## 2011-12-04 NOTE — Progress Notes (Signed)
Notified Dr. Sharyn Lull of pt's blood cultures, gram + cocci in chains, new order written to add Vancomycin per Pharmacy.Marland Kitchen

## 2011-12-04 NOTE — Progress Notes (Signed)
ANTIBIOTIC CONSULT NOTE - INITIAL  Pharmacy Consult for Vancomycin Indication: positive blood cultures  Allergies  Allergen Reactions  . Amiodarone     REACTION: fluid on lungs    Patient Measurements: Height: 5\' 7"  (170.2 cm) Weight: 166 lb 10.7 oz (75.6 kg) IBW/kg (Calculated) : 66.1   Vital Signs: Temp: 98.6 F (37 C) (05/09 1403) Temp src: Oral (05/09 1403) BP: 92/54 mmHg (05/09 1403) Pulse Rate: 76  (05/09 1403) Intake/Output from previous day: 05/08 0701 - 05/09 0700 In: 1020 [P.O.:720; IV Piggyback:300] Out: 690 [Urine:690] Intake/Output from this shift: Total I/O In: 420 [P.O.:420] Out: 400 [Urine:400]  Labs:  [facility] 12/04/11 0438 12/03/11 2040 12/03/11 1413  WBC 15.2* 18.7* 20.5*  HGB 10.0* 10.5* 10.6*  PLT 100* 116* 136*  LABCREA -- -- --  CREATININE 1.37* 1.49* 1.58*   Estimated Creatinine Clearance: 38.2 ml/min (by C-G formula based on Cr of 1.37). No results found for this basename: VANCOTROUGH:2,VANCOPEAK:2,VANCORANDOM:2,GENTTROUGH:2,GENTPEAK:2,GENTRANDOM:2,TOBRATROUGH:2,TOBRAPEAK:2,TOBRARND:2,AMIKACINPEAK:2,AMIKACINTROU:2,AMIKACIN:2, in the last 72 hours   Microbiology: Recent Results (from the past 720 hour(s))  CULTURE, BLOOD (ROUTINE X 2)     Status: Normal (Preliminary result)   Collection Time   12/03/11  8:40 PM      Component Value Range Status Comment   Specimen Description BLOOD RIGHT ARM   Final    Special Requests BOTTLES DRAWN AEROBIC AND ANAEROBIC 5CC   Final    Culture  Setup Time [id number]   Final    Culture     Final    Value: GRAM POSITIVE COCCI IN CHAINS     Note: Gram Stain Report Called to,Read Back By and Verified With: EMILY REYNOLDS 12/04/11 1135 BY SMITHERSJ   Report Status PENDING   Incomplete   CULTURE, BLOOD (ROUTINE X 2)     Status: Normal (Preliminary result)   Collection Time   12/03/11  8:47 PM      Component Value Range Status Comment   Specimen Description BLOOD RIGHT ARM   Final    Special Requests BOTTLES  DRAWN AEROBIC AND ANAEROBIC 10CC   Final    Culture  Setup Time [id number]   Final    Culture     Final    Value: GRAM POSITIVE COCCI IN CHAINS     Note: Gram Stain Report Called to,Read Back By and Verified With: EMILY REYNOLDS 12/04/11 1134 BY SMITHERSJ   Report Status PENDING   Incomplete   MRSA PCR SCREENING     Status: Normal   Collection Time   12/04/11  4:30 AM      Component Value Range Status Comment   MRSA by PCR NEGATIVE  NEGATIVE  Final   CULTURE, EXPECTORATED SPUTUM-ASSESSMENT     Status: Normal   Collection Time   12/04/11  3:35 PM      Component Value Range Status Comment   Specimen Description SPUTUM   Final    Special Requests NONE   Final    Sputum evaluation     Final    Value: THIS SPECIMEN IS ACCEPTABLE. RESPIRATORY CULTURE REPORT TO FOLLOW.   Report Status 12/04/2011 FINAL   Final     Medical History: Past Medical History  Diagnosis Date  . CHF (congestive heart failure)   . Atrial fibrillation   . HTN (hypertension)   . Pacemaker     [employer]   . Arthritis     incl spinal stenosis  . Renal insufficiency     question degree    Medications:  Anti-infectives  Start     Dose/Rate Route Frequency Ordered Stop   12/04/11 2000   vancomycin (VANCOCIN) 750 mg in sodium chloride 0.9 % 150 mL IVPB        750 mg 150 mL/hr over 60 Minutes Intravenous Every 12 hours 12/04/11 1850     12/03/11 2200   azithromycin (ZITHROMAX) 500 mg in dextrose 5 % 250 mL IVPB        500 mg 250 mL/hr over 60 Minutes Intravenous Every 24 hours 12/03/11 2007 12/10/11 2159   12/03/11 2100   cefTRIAXone (ROCEPHIN) 1 g in dextrose 5 % 50 mL IVPB        1 g 100 mL/hr over 30 Minutes Intravenous Every 24 hours 12/03/11 2007 12/10/11 2059         Assessment:  78 yo male admit for treatment for CAP, started on Rocephin, Azithromycin 5/8.  Blood cultures drawn 5/8 with 2/2 Gram positive cocci.  Vancomycin added  Goal of Therapy:  Vancomycin trough level 15-20  mcg/ml  Plan:   Vancomycin 750mg  IV q12h  Follow cultures results, renal function.  Trough at steady state.  Perlie Gold LPharmD Pager [phone] 12/04/2011,6:50 PM

## 2011-12-05 ENCOUNTER — Inpatient Hospital Stay (HOSPITAL_COMMUNITY)
Admit: 2011-12-05 | Discharge: 2011-12-05 | Disposition: A | Discharge status: Home / Self Care | Payer: Medicare Other | Attending: Neurology | Admitting: Neurology

## 2011-12-05 ENCOUNTER — Inpatient Hospital Stay (HOSPITAL_COMMUNITY): Payer: Medicare Other

## 2011-12-05 DIAGNOSIS — G459 Transient cerebral ischemic attack, unspecified: Secondary | ICD-10-CM

## 2011-12-05 DIAGNOSIS — R569 Unspecified convulsions: Secondary | ICD-10-CM

## 2011-12-05 DIAGNOSIS — R4789 Other speech disturbances: Secondary | ICD-10-CM

## 2011-12-05 LAB — BASIC METABOLIC PANEL
BUN: 35 mg/dL — ABNORMAL HIGH (ref 6–23)
CO2: 22 mEq/L (ref 19–32)
Calcium: 7.7 mg/dL — ABNORMAL LOW (ref 8.4–10.5)
Chloride: 103 mEq/L (ref 96–112)
Creatinine, Ser: 1.49 mg/dL — ABNORMAL HIGH (ref 0.50–1.35)
GFR calc Af Amer: 48 mL/min — ABNORMAL LOW (ref >90)
GFR calc non Af Amer: 42 mL/min — ABNORMAL LOW (ref >90)
Glucose, Bld: 144 mg/dL — ABNORMAL HIGH (ref 70–99)
Potassium: 3.9 mEq/L (ref 3.5–5.1)
Sodium: 134 mEq/L — ABNORMAL LOW (ref 135–145)

## 2011-12-05 LAB — CBC
HCT: 24.5 % — ABNORMAL LOW (ref 39.0–52.0)
Hemoglobin: 8.4 g/dL — ABNORMAL LOW (ref 13.0–17.0)
MCH: 31.7 pg (ref 26.0–34.0)
MCHC: 34.3 g/dL (ref 30.0–36.0)
MCV: 92.5 fL (ref 78.0–100.0)
Platelets: 65 10*3/uL — ABNORMAL LOW (ref 150–400)
RBC: 2.65 MIL/uL — ABNORMAL LOW (ref 4.22–5.81)
RDW: 14.1 % (ref 11.5–15.5)
WBC: 12 10*3/uL — ABNORMAL HIGH (ref 4.0–10.5)

## 2011-12-05 LAB — URINE CULTURE
Colony Count: NO GROWTH
Culture  Setup Time: 201305090118
Culture: NO GROWTH

## 2011-12-05 LAB — MAGNESIUM: Magnesium: 1.9 mg/dL (ref 1.5–2.5)

## 2011-12-05 MED ORDER — SODIUM CHLORIDE 0.9 % IV BOLUS (SEPSIS)
250.0000 mL | Freq: Once | INTRAVENOUS | Status: AC
  Administered 2011-12-05: 250 mL via INTRAVENOUS

## 2011-12-05 NOTE — Progress Notes (Signed)
Portable EEG completed at [facility]. 

## 2011-12-05 NOTE — Progress Notes (Signed)
Call received from lab, 2 blood culture bottles drawn from R arm on 12/03/11 growing group A strep.  Dr. Sharyn Lull notified.  No new orders received.  Ardyth Gal, RN 12/05/2011

## 2011-12-05 NOTE — Progress Notes (Signed)
CSW aware of possible SNF placement, awaiting PT/OT eval. [phone]

## 2011-12-05 NOTE — Progress Notes (Signed)
Subjective: Earlier last evening patient seemed to have some difficulty getting his words out per nursing but this improved and patient has done well otherwise overnight.  Patient has been febrile.  WBC count remains elevated although improved.  Blood cultures positive.  On antibiotics.  Remains on ASA.    Objective: Current vital signs: BP 102/62  Pulse 88  Temp(Src) 100.2 F (37.9 C) (Oral)  Resp 19  Ht 5\' 7"  (1.702 m)  Wt 75.6 kg (166 lb 10.7 oz)  BMI 26.10 kg/m2  SpO2 96% Vital signs in last 24 hours: Temp:  [98 F (36.7 C)-100.2 F (37.9 C)] 100.2 F (37.9 C) (05/09 2125) Pulse Rate:  [76-88] 88  (05/09 2125) Resp:  [15-19] 19  (05/09 2125) BP: (92-102)/(52-62) 102/62 mmHg (05/09 2125) SpO2:  [96 %-98 %] 96 % (05/09 2125) Weight:  [75.6 kg (166 lb 10.7 oz)] 75.6 kg (166 lb 10.7 oz) (05/09 0714)  Intake/Output from previous day: 05/09 0701 - 05/10 0700 In: 1380 [P.O.:780; I.V.:150; IV Piggyback:450] Out: 775 [Urine:775] Intake/Output this shift: Total I/O In: 840 [P.O.:240; I.V.:150; IV Piggyback:450] Out: 375 [Urine:375] Nutritional status: Cardiac  Neurologic Exam: Mental Status:  Alert and awake. Speech fluent without evidence of aphasia. Able to follow 3 step commands without difficulty.  Cranial Nerves:  II: visual fields grossly normal, pupils equal, round, reactive to light and accommodation  III,IV, VI: ptosis not present, extra-ocular motions intact bilaterally  V,VII: smile symmetric, facial light touch sensation decreased on the right  VIII: hearing normal bilaterally  IX,X: gag reflex present  XI: trapezius strength/neck flexion strength normal bilaterally  XII: tongue strength normal  Motor:  Right : Upper extremity 5/5   Left: Upper extremity Limited exam secondary to pain   Lower extremity 5/5           Lower extremity Limited exam secondary to pain  Tone and bulk:normal tone throughout; significant atrophy noted in the hand intrinsics bilaterally    Sensory: Pinprick and light touch intact decreased on the left  Deep Tendon Reflexes: 1+ in the upper extremities and and symmetric throughout  Plantars:  Right: downgoing   Left: downgoing  Cerebellar:  Not tested secondary to pain   Lab Results: Results for orders placed during the hospital encounter of 12/03/11 (from the past 48 hour(s))  CBC     Status: Abnormal   Collection Time   12/03/11  2:13 PM      Component Value Range Comment   WBC 20.5 (*) 4.0 - 10.5 (K/uL)    RBC 3.36 (*) 4.22 - 5.81 (MIL/uL)    Hemoglobin 10.6 (*) 13.0 - 17.0 (g/dL)    HCT 16.1 (*) 09.6 - 52.0 (%)    MCV 94.6  78.0 - 100.0 (fL)    MCH 31.5  26.0 - 34.0 (pg)    MCHC 33.3  30.0 - 36.0 (g/dL)    RDW 04.5  40.9 - 81.1 (%)    Platelets 136 (*) 150 - 400 (K/uL)   DIFFERENTIAL     Status: Abnormal   Collection Time   12/03/11  2:13 PM      Component Value Range Comment   Neutrophils Relative 96 (*) 43 - 77 (%)    Neutro Abs 19.7 (*) 1.7 - 7.7 (K/uL)    Lymphocytes Relative 1 (*) 12 - 46 (%)    Lymphs Abs 0.2 (*) 0.7 - 4.0 (K/uL)    Monocytes Relative 3  3 - 12 (%)    Monocytes Absolute 0.5  0.1 - 1.0 (K/uL)    Eosinophils Relative 0  0 - 5 (%)    Eosinophils Absolute 0.0  0.0 - 0.7 (K/uL)    Basophils Relative 0  0 - 1 (%)    Basophils Absolute 0.0  0.0 - 0.1 (K/uL)   BASIC METABOLIC PANEL     Status: Abnormal   Collection Time   12/03/11  2:13 PM      Component Value Range Comment   Sodium 136  135 - 145 (mEq/L)    Potassium 3.3 (*) 3.5 - 5.1 (mEq/L)    Chloride 99  96 - 112 (mEq/L)    CO2 22  19 - 32 (mEq/L)    Glucose, Bld 190 (*) 70 - 99 (mg/dL)    BUN 27 (*) 6 - 23 (mg/dL)    Creatinine, Ser 1.19 (*) 0.50 - 1.35 (mg/dL)    Calcium 8.8  8.4 - 10.5 (mg/dL)    GFR calc non Af Amer 39 (*) >90 (mL/min)    GFR calc Af Amer 45 (*) >90 (mL/min)   URINALYSIS, ROUTINE W REFLEX MICROSCOPIC     Status: Abnormal   Collection Time   12/03/11  3:19 PM      Component Value Range Comment   Color, Urine  YELLOW  YELLOW     APPearance CLEAR  CLEAR     Specific Gravity, Urine 1.018  1.005 - 1.030     pH 6.0  5.0 - 8.0     Glucose, UA NEGATIVE  NEGATIVE (mg/dL)    Hgb urine dipstick SMALL (*) NEGATIVE     Bilirubin Urine NEGATIVE  NEGATIVE     Ketones, ur NEGATIVE  NEGATIVE (mg/dL)    Protein, ur 30 (*) NEGATIVE (mg/dL)    Urobilinogen, UA 0.2  0.0 - 1.0 (mg/dL)    Nitrite NEGATIVE  NEGATIVE     Leukocytes, UA NEGATIVE  NEGATIVE    URINE CULTURE     Status: Normal   Collection Time   12/03/11  3:19 PM      Component Value Range Comment   Specimen Description URINE, CATHETERIZED      Special Requests NONE      Culture  Setup Time [id number]      Colony Count NO GROWTH      Culture NO GROWTH      Report Status 12/05/2011 FINAL     URINE MICROSCOPIC-ADD ON     Status: Abnormal   Collection Time   12/03/11  3:19 PM      Component Value Range Comment   RBC / HPF 3-6  <3 (RBC/hpf)    Bacteria, UA MANY (*) RARE    GLUCOSE, CAPILLARY     Status: Abnormal   Collection Time   12/03/11  8:32 PM      Component Value Range Comment   Glucose-Capillary 151 (*) 70 - 99 (mg/dL)   HIV ANTIBODY (ROUTINE TESTING)     Status: Normal   Collection Time   12/03/11  8:40 PM      Component Value Range Comment   HIV NON REACTIVE  NON REACTIVE    CULTURE, BLOOD (ROUTINE X 2)     Status: Normal (Preliminary result)   Collection Time   12/03/11  8:40 PM      Component Value Range Comment   Specimen Description BLOOD RIGHT ARM      Special Requests BOTTLES DRAWN AEROBIC AND ANAEROBIC 5CC      Culture  Setup Time [id number]  Culture        Value: GRAM POSITIVE COCCI IN CHAINS     Note: Gram Stain Report Called to,Read Back By and Verified With: EMILY [place]a Retana 12/04/11 1135 BY [email or web address]   Report Status PENDING     COMPREHENSIVE METABOLIC PANEL     Status: Abnormal   Collection Time   12/03/11  8:40 PM      Component Value Range Comment   Sodium 134 (*) 135 - 145 (mEq/L)    Potassium 3.4 (*) 3.5 - 5.1  (mEq/L)    Chloride 98  96 - 112 (mEq/L)    CO2 22  19 - 32 (mEq/L)    Glucose, Bld 158 (*) 70 - 99 (mg/dL)    BUN 27 (*) 6 - 23 (mg/dL)    Creatinine, Ser 1.91 (*) 0.50 - 1.35 (mg/dL)    Calcium 8.3 (*) 8.4 - 10.5 (mg/dL)    Total Protein 6.6  6.0 - 8.3 (g/dL)    Albumin 2.9 (*) 3.5 - 5.2 (g/dL)    AST 35  0 - 37 (U/L)    ALT 19  0 - 53 (U/L)    Alkaline Phosphatase 62  39 - 117 (U/L)    Total Bilirubin 0.6  0.3 - 1.2 (mg/dL)    GFR calc non Af Amer 42 (*) >90 (mL/min)    GFR calc Af Amer 48 (*) >90 (mL/min)   MAGNESIUM     Status: Normal   Collection Time   12/03/11  8:40 PM      Component Value Range Comment   Magnesium 1.6  1.5 - 2.5 (mg/dL)   TSH     Status: Normal   Collection Time   12/03/11  8:40 PM      Component Value Range Comment   TSH 1.813  0.350 - 4.500 (uIU/mL)   CBC     Status: Abnormal   Collection Time   12/03/11  8:40 PM      Component Value Range Comment   WBC 18.7 (*) 4.0 - 10.5 (K/uL)    RBC 3.29 (*) 4.22 - 5.81 (MIL/uL)    Hemoglobin 10.5 (*) 13.0 - 17.0 (g/dL)    HCT 47.8 (*) 29.5 - 52.0 (%)    MCV 94.5  78.0 - 100.0 (fL)    MCH 31.9  26.0 - 34.0 (pg)    MCHC 33.8  30.0 - 36.0 (g/dL)    RDW 62.1  30.8 - 65.7 (%)    Platelets 116 (*) 150 - 400 (K/uL)   DIFFERENTIAL     Status: Abnormal   Collection Time   12/03/11  8:40 PM      Component Value Range Comment   Neutrophils Relative 95 (*) 43 - 77 (%)    Lymphocytes Relative 3 (*) 12 - 46 (%)    Monocytes Relative 2 (*) 3 - 12 (%)    Eosinophils Relative 0  0 - 5 (%)    Basophils Relative 0  0 - 1 (%)    Neutro Abs 17.7 (*) 1.7 - 7.7 (K/uL)    Lymphs Abs 0.6 (*) 0.7 - 4.0 (K/uL)    Monocytes Absolute 0.4  0.1 - 1.0 (K/uL)    Eosinophils Absolute 0.0  0.0 - 0.7 (K/uL)    Basophils Absolute 0.0  0.0 - 0.1 (K/uL)    WBC Morphology INCREASED BANDS (>20% BANDS)      Smear Review PLATELET COUNT CONFIRMED BY SMEAR     CULTURE, BLOOD (ROUTINE X 2)  Status: Normal (Preliminary result)   Collection Time    12/03/11  8:47 PM      Component Value Range Comment   Specimen Description BLOOD RIGHT ARM      Special Requests BOTTLES DRAWN AEROBIC AND ANAEROBIC 10CC      Culture  Setup Time [id number]      Culture        Value: GRAM POSITIVE COCCI IN CHAINS     Note: Gram Stain Report Called to,Read Back By and Verified With: EMILY Amaziah Ghosh 12/04/11 1134 BY [email or web address]   Report Status PENDING     LEGIONELLA ANTIGEN, URINE     Status: Normal   Collection Time   12/04/11  3:15 AM      Component Value Range Comment   Specimen Description URINE, CLEAN CATCH      Special Requests NONE      Legionella Antigen, Urine Negative for Legionella pneumophilia serogroup 1      Report Status 12/04/2011 FINAL     STREP PNEUMONIAE URINARY ANTIGEN     Status: Normal   Collection Time   12/04/11  3:15 AM      Component Value Range Comment   Strep Pneumo Urinary Antigen NEGATIVE  NEGATIVE    MRSA PCR SCREENING     Status: Normal   Collection Time   12/04/11  4:30 AM      Component Value Range Comment   MRSA by PCR NEGATIVE  NEGATIVE    BASIC METABOLIC PANEL     Status: Abnormal   Collection Time   12/04/11  4:38 AM      Component Value Range Comment   Sodium 133 (*) 135 - 145 (mEq/L)    Potassium 3.2 (*) 3.5 - 5.1 (mEq/L)    Chloride 99  96 - 112 (mEq/L)    CO2 20  19 - 32 (mEq/L)    Glucose, Bld 171 (*) 70 - 99 (mg/dL)    BUN 26 (*) 6 - 23 (mg/dL)    Creatinine, Ser 8.11 (*) 0.50 - 1.35 (mg/dL)    Calcium 8.2 (*) 8.4 - 10.5 (mg/dL)    GFR calc non Af Amer 46 (*) >90 (mL/min)    GFR calc Af Amer 53 (*) >90 (mL/min)   CBC     Status: Abnormal   Collection Time   12/04/11  4:38 AM      Component Value Range Comment   WBC 15.2 (*) 4.0 - 10.5 (K/uL)    RBC 3.15 (*) 4.22 - 5.81 (MIL/uL)    Hemoglobin 10.0 (*) 13.0 - 17.0 (g/dL)    HCT 91.4 (*) 78.2 - 52.0 (%)    MCV 94.0  78.0 - 100.0 (fL)    MCH 31.7  26.0 - 34.0 (pg)    MCHC 33.8  30.0 - 36.0 (g/dL)    RDW 95.6  21.3 - 08.6 (%)    Platelets 100 (*) 150 - 400  (K/uL)   CULTURE, EXPECTORATED SPUTUM-ASSESSMENT     Status: Normal   Collection Time   12/04/11  3:35 PM      Component Value Range Comment   Specimen Description SPUTUM      Special Requests NONE      Sputum evaluation        Value: THIS SPECIMEN IS ACCEPTABLE. RESPIRATORY CULTURE REPORT TO FOLLOW.   Report Status 12/04/2011 FINAL       Recent Results (from the past 240 hour(s))  URINE CULTURE     Status: Normal  Collection Time   12/03/11  3:19 PM      Component Value Range Status Comment   Specimen Description URINE, CATHETERIZED   Final    Special Requests NONE   Final    Culture  Setup Time [id number]   Final    Colony Count NO GROWTH   Final    Culture NO GROWTH   Final    Report Status 12/05/2011 FINAL   Final   CULTURE, BLOOD (ROUTINE X 2)     Status: Normal (Preliminary result)   Collection Time   12/03/11  8:40 PM      Component Value Range Status Comment   Specimen Description BLOOD RIGHT ARM   Final    Special Requests BOTTLES DRAWN AEROBIC AND ANAEROBIC 5CC   Final    Culture  Setup Time [id number]   Final    Culture     Final    Value: GRAM POSITIVE COCCI IN CHAINS     Note: Gram Stain Report Called to,Read Back By and Verified With: EMILY Nyna Chilton 12/04/11 1135 BY SMITHERSJ   Report Status PENDING   Incomplete   CULTURE, BLOOD (ROUTINE X 2)     Status: Normal (Preliminary result)   Collection Time   12/03/11  8:47 PM      Component Value Range Status Comment   Specimen Description BLOOD RIGHT ARM   Final    Special Requests BOTTLES DRAWN AEROBIC AND ANAEROBIC 10CC   Final    Culture  Setup Time [id number]   Final    Culture     Final    Value: GRAM POSITIVE COCCI IN CHAINS     Note: Gram Stain Report Called to,Read Back By and Verified With: EMILY Ann Bohne 12/04/11 1134 BY SMITHERSJ   Report Status PENDING   Incomplete   MRSA PCR SCREENING     Status: Normal   Collection Time   12/04/11  4:30 AM      Component Value Range Status Comment   MRSA by PCR NEGATIVE   NEGATIVE  Final   CULTURE, EXPECTORATED SPUTUM-ASSESSMENT     Status: Normal   Collection Time   12/04/11  3:35 PM      Component Value Range Status Comment   Specimen Description SPUTUM   Final    Special Requests NONE   Final    Sputum evaluation     Final    Value: THIS SPECIMEN IS ACCEPTABLE. RESPIRATORY CULTURE REPORT TO FOLLOW.   Report Status 12/04/2011 FINAL   Final     Lipid Panel No results found for this basename: CHOL,TRIG,HDL,CHOLHDL,VLDL,LDLCALC in the last 72 hours  Studies/Results: Dg Chest 2 View  12/03/2011  *RADIOLOGY REPORT*  Clinical Data: Fall.  Right anterior chest pain.  CHEST - 2 VIEW 12/03/2011:  Comparison: Right rib x-rays obtained concurrently.  Portable chest x-ray 10/13/2011.  Findings: Cardiac silhouette mildly enlarged but stable.  Thoracic aorta atherosclerotic, unchanged.  Hilar and mediastinal contours otherwise unremarkable.  Suboptimal inspiration accounts for mild linear atelectasis in the lung bases.  Lungs otherwise clear.  Mild pulmonary venous hypertension, increased in the interval, without overt pulmonary edema.  Dual lead transvenous pacemaker unchanged in intact.  Degenerative changes and DISH involving the thoracic spine.  IMPRESSION: Suboptimal inspiration accounts for mild atelectasis in the lung bases.  No acute cardiopulmonary disease otherwise.  Stable cardiomegaly.  Pulmonary venous hypertension without overt edema.  Original Report Authenticated By: Arnell Sieving, M.D.   Dg Ribs Unilateral Right  12/03/2011  *  RADIOLOGY REPORT*  Clinical Data: Larey Seat and injured right ribs.  Right anterior chest pain.  RIGHT RIBS - 2 VIEW 12/03/2011::  Comparison: No prior right rib imaging.  Two-view chest x-ray same date is correlated.  Findings: No right rib fractures identified.  No intrinsic osseous abnormalities involving the right ribs.  Prominent costal cartilage calcification.  IMPRESSION: No right rib fracture identified.  Original Report  Authenticated By: Arnell Sieving, M.D.   Dg Wrist Complete Left  12/04/2011  *RADIOLOGY REPORT*  Clinical Data: Larey Seat landing on left wrist, pain, swelling, bruising and redness  LEFT WRIST - COMPLETE 3+ VIEW  Comparison: None  Findings: Significant osseous demineralization. Widening of scapholunate interval. Degenerative changes at first carpometacarpal joint. Mild degenerative changes at first MCP joint with mild subluxation of the proximal phalanx. No definite acute fracture, dislocation, or bone destruction. Scattered radial artery atherosclerotic calcification.  IMPRESSION: Degenerative changes first CMC and first MCP joints. Osseous demineralization. No acute osseous abnormalities. Widened scapholunate interval consistent with torn scapholunate ligament.  Original Report Authenticated By: Lollie Marrow, M.D.   Dg Hip Bilateral W/pelvis  12/03/2011  *RADIOLOGY REPORT*  Clinical Data: Fall with bilateral hip pain.  BILATERAL HIP WITH PELVIS - 4+ VIEW  Comparison: None.  Findings: Frontal view the pelvis shows diffuse bony demineralization.  The patient is status post left total hip arthroplasty.  There is a bipolar hemiarthroplasty on the right. No evidence for pelvic fracture.  No periprosthetic femur fracture is seen on a the sign.  Although we do not have a cross-table lateral view of the hips, the femoral components appear located.  IMPRESSION: No acute bony findings.  Original Report Authenticated By: ERIC A. MANSELL, M.D.   Ct Head Wo Contrast  12/03/2011  *RADIOLOGY REPORT*  Clinical Data: New onset of aphasia; assess for CVA.  CT HEAD WITHOUT CONTRAST  Technique:  Contiguous axial images were obtained from the base of the skull through the vertex without contrast.  Comparison: CT of the head performed 10/19/2011  Findings: There is no evidence of acute infarction, mass lesion, or intra- or extra-axial hemorrhage on CT.  Increased density anterior to the brainstem is thought to reflect beam  hardening artifact. Evaluation is suboptimal due to motion artifact and machine artifact.  Chronic lacunar infarcts are seen within the right thalamus, right cerebral peduncle and left basal ganglia.  The posterior fossa, including the cerebellum, brainstem and fourth ventricle, is within normal limits.  The third and lateral ventricles are unremarkable in appearance.  The cerebral hemispheres demonstrate grossly normal gray-white differentiation. No mass effect or midline shift is seen.  There is no evidence of fracture; visualized osseous structures are unremarkable in appearance.  The orbits are within normal limits. There is mild partial opacification of the right mastoid air cells. The paranasal sinuses and left mastoid air cells are well-aerated. No significant soft tissue abnormalities are seen.  IMPRESSION:  1.  No acute intracranial pathology seen on CT. 2.  Chronic lacunar infarcts within the right thalamus, right cerebral peduncle and left basal ganglia. 3.  Mild partial opacification of the right mastoid air cells.  These results were called by telephone on 12/03/2011  at  09:25 p.m. to  Nursing on [facility], who verbally acknowledged these results.  Original Report Authenticated By: Tonia Ghent, M.D.    Medications:  I have reviewed the patient's current medications. Scheduled:   . aspirin  325 mg Oral Daily  . azithromycin  500 mg Intravenous Q24H  .  B-complex with vitamin C  1 tablet Oral q morning - 10a  . carvedilol  3.125 mg Oral BID AC  . cefTRIAXone (ROCEPHIN)  IV  1 g Intravenous Q24H  . digoxin  125 mcg Oral QHS  . finasteride  5 mg Oral q morning - 10a  . folic acid  1 mg Oral Daily  . heparin subcutaneous  5,000 Units Subcutaneous Q8H  . lisinopril  5 mg Oral QHS  . mulitivitamin with minerals  1 tablet Oral BID  . omega-3 acid ethyl esters  1 g Oral Daily  . potassium chloride  40 mEq Oral Once  . simvastatin  10 mg Oral q1800  . Tamsulosin HCl  0.8 mg Oral q  morning - 10a  . thiamine  100 mg Oral Daily  . vancomycin  750 mg Intravenous Q12H  . DISCONTD: carvedilol  6.25 mg Oral BID AC  . DISCONTD: lisinopril  10 mg Oral QHS    Assessment/Plan:  Patient Active Hospital Problem List: Difficulty with speech   Assessment:  Patient with one further episode very similar to the initial episode.  At this point concerned that this may be secondary to a TME related to patient's concomitant infection.  Will rule out other possibilities including seizure.  Exam currently unchanged.   Plan:  1.  Continue ASA  2.  EEG   LOS: 2 days   Thana Farr, MD [facility] [phone] 12/05/2011  4:16 AM

## 2011-12-05 NOTE — Plan of Care (Signed)
Problem: Phase I Progression Outcomes Goal: OOB as tolerated unless otherwise ordered Outcome: Not Progressing Pt refusing OOB - states he is in too much pain.  MD aware.

## 2011-12-05 NOTE — Progress Notes (Signed)
Subjective:  Patient more awake now no further episodes of difficulty in speech. Blood cultures positive final culture sensitivity is still pending  Objective:  Vital Signs in the last 24 hours: Temp:  [98 F (36.7 C)-100.2 F (37.9 C)] 98 F (36.7 C) (05/10 1510) Pulse Rate:  [67-89] 73  (05/10 1855) Resp:  [18-19] 18  (05/10 1510) BP: (72-105)/(38-62) 105/53 mmHg (05/10 1855) SpO2:  [94 %-99 %] 99 % (05/10 1510)  Intake/Output from previous day: 05/09 0701 - 05/10 0700 In: 1905 [P.O.:780; I.V.:675; IV Piggyback:450] Out: 950 [Urine:950] Intake/Output from this shift:    Physical Exam: Neck: no adenopathy, no carotid bruit, no JVD and supple, symmetrical, trachea midline Lungs: Decreased breath sound at bases with bilateral rhonchi Heart: irregularly irregular rhythm, S1, S2 normal and Soft systolic murmur noted Abdomen: soft, non-tender; bowel sounds normal; no masses,  no organomegaly Extremities: extremities normal, atraumatic, no cyanosis or edema  Lab Results:  Basename 12/05/11 1339 12/04/11 0438  WBC 12.0* 15.2*  HGB 8.4* 10.0*  PLT 65* 100*    Basename 12/05/11 1339 12/04/11 0438  NA 134* 133*  K 3.9 3.2*  CL 103 99  CO2 22 20  GLUCOSE 144* 171*  BUN 35* 26*  CREATININE 1.49* 1.37*   No results found for this basename: TROPONINI:2,CK,MB:2 in the last 72 hours Hepatic Function Panel  Basename 12/03/11 2040  PROT 6.6  ALBUMIN 2.9*  AST 35  ALT 19  ALKPHOS 62  BILITOT 0.6  BILIDIR --  IBILI --   No results found for this basename: CHOL in the last 72 hours No results found for this basename: PROTIME in the last 72 hours  Imaging: Imaging results have been reviewed and Dg Chest 2 View  12/05/2011  *RADIOLOGY REPORT*  Clinical Data: Shortness of breath.  CHEST - 2 VIEW  Comparison: 12/03/2011  Findings: Dual lead transvenous pacemaker again seen.  Heart size is stable.  Mild scarring seen in the lateral left lung base. Mild worsening asymmetric  opacity is seen in the central right upper lobe, and right upper lobe pneumonia cannot be excluded.  No definite pleural effusion.  IMPRESSION: Question developing pneumonia in medial right upper lobe. Continued radiographic followup is recommended.  Original Report Authenticated By: Danae Orleans, M.D.   Dg Wrist Complete Left  12/04/2011  *RADIOLOGY REPORT*  Clinical Data: Larey Seat landing on left wrist, pain, swelling, bruising and redness  LEFT WRIST - COMPLETE 3+ VIEW  Comparison: None  Findings: Significant osseous demineralization. Widening of scapholunate interval. Degenerative changes at first carpometacarpal joint. Mild degenerative changes at first MCP joint with mild subluxation of the proximal phalanx. No definite acute fracture, dislocation, or bone destruction. Scattered radial artery atherosclerotic calcification.  IMPRESSION: Degenerative changes first CMC and first MCP joints. Osseous demineralization. No acute osseous abnormalities. Widened scapholunate interval consistent with torn scapholunate ligament.  Original Report Authenticated By: Lollie Marrow, M.D.   Dg Hip Bilateral W/pelvis  12/03/2011  *RADIOLOGY REPORT*  Clinical Data: Fall with bilateral hip pain.  BILATERAL HIP WITH PELVIS - 4+ VIEW  Comparison: None.  Findings: Frontal view the pelvis shows diffuse bony demineralization.  The patient is status post left total hip arthroplasty.  There is a bipolar hemiarthroplasty on the right. No evidence for pelvic fracture.  No periprosthetic femur fracture is seen on a the sign.  Although we do not have a cross-table lateral view of the hips, the femoral components appear located.  IMPRESSION: No acute bony findings.  Original  Report Authenticated By: ERIC A. MANSELL, M.D.   Ct Head Wo Contrast  12/03/2011  *RADIOLOGY REPORT*  Clinical Data: New onset of aphasia; assess for CVA.  CT HEAD WITHOUT CONTRAST  Technique:  Contiguous axial images were obtained from the base of the skull through  the vertex without contrast.  Comparison: CT of the head performed 10/19/2011  Findings: There is no evidence of acute infarction, mass lesion, or intra- or extra-axial hemorrhage on CT.  Increased density anterior to the brainstem is thought to reflect beam hardening artifact. Evaluation is suboptimal due to motion artifact and machine artifact.  Chronic lacunar infarcts are seen within the right thalamus, right cerebral peduncle and left basal ganglia.  The posterior fossa, including the cerebellum, brainstem and fourth ventricle, is within normal limits.  The third and lateral ventricles are unremarkable in appearance.  The cerebral hemispheres demonstrate grossly normal gray-white differentiation. No mass effect or midline shift is seen.  There is no evidence of fracture; visualized osseous structures are unremarkable in appearance.  The orbits are within normal limits. There is mild partial opacification of the right mastoid air cells. The paranasal sinuses and left mastoid air cells are well-aerated. No significant soft tissue abnormalities are seen.  IMPRESSION:  1.  No acute intracranial pathology seen on CT. 2.  Chronic lacunar infarcts within the right thalamus, right cerebral peduncle and left basal ganglia. 3.  Mild partial opacification of the right mastoid air cells.  These results were called by telephone on 12/03/2011  at  09:25 p.m. to  Nursing on [facility], who verbally acknowledged these results.  Original Report Authenticated By: Tonia Ghent, M.D.    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  LOS: 2 days    Tyr Franca N 12/05/2011, 7:37 PM

## 2011-12-06 ENCOUNTER — Encounter (HOSPITAL_COMMUNITY): Payer: Self-pay | Admitting: Orthopedic Surgery

## 2011-12-06 DIAGNOSIS — M009 Pyogenic arthritis, unspecified: Secondary | ICD-10-CM | POA: Diagnosis not present

## 2011-12-06 LAB — BASIC METABOLIC PANEL
BUN: 29 mg/dL — ABNORMAL HIGH (ref 6–23)
CO2: 21 mEq/L (ref 19–32)
Calcium: 8.1 mg/dL — ABNORMAL LOW (ref 8.4–10.5)
Chloride: 100 mEq/L (ref 96–112)
Creatinine, Ser: 1.25 mg/dL (ref 0.50–1.35)
GFR calc Af Amer: 60 mL/min — ABNORMAL LOW (ref >90)
GFR calc non Af Amer: 51 mL/min — ABNORMAL LOW (ref >90)
Glucose, Bld: 115 mg/dL — ABNORMAL HIGH (ref 70–99)
Potassium: 3.9 mEq/L (ref 3.5–5.1)
Sodium: 131 mEq/L — ABNORMAL LOW (ref 135–145)

## 2011-12-06 LAB — CBC
HCT: 25.6 % — ABNORMAL LOW (ref 39.0–52.0)
Hemoglobin: 8.7 g/dL — ABNORMAL LOW (ref 13.0–17.0)
MCH: 31.5 pg (ref 26.0–34.0)
MCHC: 34 g/dL (ref 30.0–36.0)
MCV: 92.8 fL (ref 78.0–100.0)
Platelets: 74 10*3/uL — ABNORMAL LOW (ref 150–400)
RBC: 2.76 MIL/uL — ABNORMAL LOW (ref 4.22–5.81)
RDW: 14.2 % (ref 11.5–15.5)
WBC: 11.9 10*3/uL — ABNORMAL HIGH (ref 4.0–10.5)

## 2011-12-06 LAB — SYNOVIAL CELL COUNT + DIFF, W/ CRYSTALS
Crystals, Fluid: NONE SEEN
Eosinophils-Synovial: 0 % (ref 0–1)
Lymphocytes-Synovial Fld: 2 % (ref 0–20)
Monocyte-Macrophage-Synovial Fluid: 7 % — ABNORMAL LOW (ref 50–90)
Neutrophil, Synovial: 91 % — ABNORMAL HIGH (ref 0–25)
WBC, Synovial: 49000 /mm3 — ABNORMAL HIGH (ref 0–200)

## 2011-12-06 MED ORDER — LIDOCAINE HCL 2 % IJ SOLN
INTRAMUSCULAR | Status: AC
  Filled 2011-12-06: qty 1

## 2011-12-06 MED ORDER — LIDOCAINE HCL 1 % IJ SOLN
INTRAMUSCULAR | Status: AC
  Administered 2011-12-06: 2 mL via INTRADERMAL
  Filled 2011-12-06: qty 20

## 2011-12-06 MED ORDER — LIDOCAINE HCL (PF) 1 % IJ SOLN: 2.0000 mL | Freq: Once | INTRAMUSCULAR | Status: AC

## 2011-12-06 NOTE — Consult Note (Signed)
[name] is an 78 y.o. male.   Chief Complaint: left wrist pain HPI: 78 yo rhd male with left wrist pain since fall in shower a few days ago.  Remembers no previous problems with wrist and no other joint pains at this time.  Does have some chronic arthritis, but states this is different.  No chills or night sweats.  Doesn't feel ill.  Remembers no wounds to wrist. No history of gout.  Past Medical History  Diagnosis Date  . CHF (congestive heart failure)   . Atrial fibrillation   . HTN (hypertension)   . Pacemaker     [employer]   . Arthritis     incl spinal stenosis  . Renal insufficiency     question degree    Past Surgical History  Procedure Date  . Bilateral shoulder surgery   . Tonsillectomy   . Prosthetic hip - bilaterally   . R knee quadricep tendon repair     History reviewed. No pertinent family history. Social History:  reports that he quit smoking about 40 years ago. His smoking use included Cigarettes. He has never used smokeless tobacco. He reports that he drinks alcohol. He reports that he does not use illicit drugs.  Allergies:  Allergies  Allergen Reactions  . Amiodarone     REACTION: fluid on lungs    Medications Prior to Admission  Medication Sig Dispense Refill  . acetaminophen (TYLENOL) 325 MG tablet Take 650 mg by mouth 2 (two) times daily.      . B Complex-C (B-COMPLEX WITH VITAMIN C) tablet Take 1 tablet by mouth every morning.       . carvedilol (COREG) 3.125 MG tablet Take 3.125-6.25 mg by mouth See admin instructions. He takes as needed if his blood pressure is >140 he takes two tablets, if it is < 100 he takes one tablet and if it is 120 he does not take any tablets.      . digoxin (LANOXIN) 0.125 MG tablet Take 125 mcg by mouth at bedtime.      . finasteride (PROSCAR) 5 MG tablet Take 5 mg by mouth every morning.       . folic acid (FOLVITE) 400 MCG tablet Take 400 mcg by mouth every morning.      Marland Kitchen lisinopril (PRINIVIL,ZESTRIL) 10  MG tablet Take 10 mg by mouth at bedtime.      . Multiple Vitamin (MULITIVITAMIN WITH MINERALS) TABS Take 1 tablet by mouth 2 (two) times daily.       . Omega-3 Fatty Acids (FISH OIL) 1000 MG CAPS Take 2 capsules by mouth 2 (two) times daily.       . pravastatin (PRAVACHOL) 20 MG tablet Take 20 mg by mouth at bedtime.        . Tamsulosin HCl (FLOMAX) 0.4 MG CAPS Take 0.8 mg by mouth every morning.       . Thiamine HCl (VITAMIN B-1) 100 MG tablet Take 100 mg by mouth daily.        Marland Kitchen zolpidem (AMBIEN) 10 MG tablet Take 10 mg by mouth at bedtime.        Results for orders placed during the hospital encounter of 12/03/11 (from the past 48 hour(s))  CBC     Status: Abnormal   Collection Time   12/05/11  1:39 PM      Component Value Range Comment   WBC 12.0 (*) 4.0 - 10.5 (K/uL)    RBC 2.65 (*) 4.22 - 5.81 (MIL/uL)  Hemoglobin 8.4 (*) 13.0 - 17.0 (g/dL)    HCT 16.1 (*) 09.6 - 52.0 (%)    MCV 92.5  78.0 - 100.0 (fL)    MCH 31.7  26.0 - 34.0 (pg)    MCHC 34.3  30.0 - 36.0 (g/dL)    RDW 04.5  40.9 - 81.1 (%)    Platelets 65 (*) 150 - 400 (K/uL)   BASIC METABOLIC PANEL     Status: Abnormal   Collection Time   12/05/11  1:39 PM      Component Value Range Comment   Sodium 134 (*) 135 - 145 (mEq/L)    Potassium 3.9  3.5 - 5.1 (mEq/L)    Chloride 103  96 - 112 (mEq/L)    CO2 22  19 - 32 (mEq/L)    Glucose, Bld 144 (*) 70 - 99 (mg/dL)    BUN 35 (*) 6 - 23 (mg/dL)    Creatinine, Ser 9.14 (*) 0.50 - 1.35 (mg/dL)    Calcium 7.7 (*) 8.4 - 10.5 (mg/dL)    GFR calc non Af Amer 42 (*) >90 (mL/min)    GFR calc Af Amer 48 (*) >90 (mL/min)   MAGNESIUM     Status: Normal   Collection Time   12/05/11  1:39 PM      Component Value Range Comment   Magnesium 1.9  1.5 - 2.5 (mg/dL)   BASIC METABOLIC PANEL     Status: Abnormal   Collection Time   12/06/11  5:31 AM      Component Value Range Comment   Sodium 131 (*) 135 - 145 (mEq/L)    Potassium 3.9  3.5 - 5.1 (mEq/L)    Chloride 100  96 - 112 (mEq/L)     CO2 21  19 - 32 (mEq/L)    Glucose, Bld 115 (*) 70 - 99 (mg/dL)    BUN 29 (*) 6 - 23 (mg/dL)    Creatinine, Ser 7.82  0.50 - 1.35 (mg/dL)    Calcium 8.1 (*) 8.4 - 10.5 (mg/dL)    GFR calc non Af Amer 51 (*) >90 (mL/min)    GFR calc Af Amer 60 (*) >90 (mL/min)   CBC     Status: Abnormal   Collection Time   12/06/11  5:31 AM      Component Value Range Comment   WBC 11.9 (*) 4.0 - 10.5 (K/uL)    RBC 2.76 (*) 4.22 - 5.81 (MIL/uL)    Hemoglobin 8.7 (*) 13.0 - 17.0 (g/dL)    HCT 95.6 (*) 21.3 - 52.0 (%)    MCV 92.8  78.0 - 100.0 (fL)    MCH 31.5  26.0 - 34.0 (pg)    MCHC 34.0  30.0 - 36.0 (g/dL)    RDW 08.6  57.8 - 46.9 (%)    Platelets 74 (*) 150 - 400 (K/uL) CONSISTENT WITH PREVIOUS RESULT    Dg Chest 2 View  12/05/2011  *RADIOLOGY REPORT*  Clinical Data: Shortness of breath.  CHEST - 2 VIEW  Comparison: 12/03/2011  Findings: Dual lead transvenous pacemaker again seen.  Heart size is stable.  Mild scarring seen in the lateral left lung base. Mild worsening asymmetric opacity is seen in the central right upper lobe, and right upper lobe pneumonia cannot be excluded.  No definite pleural effusion.  IMPRESSION: Question developing pneumonia in medial right upper lobe. Continued radiographic followup is recommended.  Original Report Authenticated By: Danae Orleans, M.D.     A comprehensive review of  systems was negative except for: Constitutional: positive for fevers Gastrointestinal: positive for diarrhea  Blood pressure 118/65, pulse 71, temperature 98.9 F (37.2 C), temperature source Oral, resp. rate 18, height 5\' 7"  (1.702 m), weight 75.6 kg (166 lb 10.7 oz), SpO2 96.00%.  General appearance: alert, cooperative, appears stated age and no distress Head: Normocephalic, without obvious abnormality, atraumatic Extremities: light touch sensation and capillary refill intact all digits.  +epl/fpl/io.  right hand without wounds or ttp.  left hand with no wounds.  ttp at wrist.  mild  erythema on dorsum of hand, less so at wrist.  no streaks.  swelling in dorsum of hand and wrist.  pain with wrist motion. Pulses: 2+ and symmetric Skin: as above Neurologic: Grossly normal Incision/Wound: na  Assessment/Plan Left wrist gout/pseudogout vs infection.  SL widening in 78 year old is likely chronic.  CMC arthritis on XR.  Left wrist aspiration performed after verbal informed consent.  1.5 cc cloudy yellow fluid obtained.  Not grossly purulent.  Sent to lab for analysis.  Will order uric acid.  Npo after midnight and hold AM heparin pending lab results.  If infection seems likely diagnosis, will plan for wrist I&D tomorrow.  At this time, gout seems more likely.  Recommend antiinflammatory if patient can tolerate medically.  Procedure: After verbal informed consent, left wrist skin anesthetized with 1% plain lidocaine.  Prepped with betadine and draped with sterile towels.  Wrist aspirated at ulnar side.  1.5 ml of cloudy yellow fluid obtained.  Sent for crystal exam, cell count, cultures and gram stain.  Aspiration site dressed with sterile 2x2 and kerlix.  Patient tolerated procedure well.  Portia Wisdom R 12/06/2011, 9:23 PM

## 2011-12-06 NOTE — Progress Notes (Signed)
Subjective:  Patient denies any chest pain or shortness of breath complains of left wrist pain. Also complains of occasional blurring of vision. No weakness in the arms or legs speech has improved  Objective:  Vital Signs in the last 24 hours: Temp:  [98 F (36.7 C)-99.5 F (37.5 C)] 98.5 F (36.9 C) (05/11 0617) Pulse Rate:  [67-84] 83  (05/11 0715) Resp:  [18-20] 18  (05/11 0617) BP: (89-122)/(50-73) 119/69 mmHg (05/11 0715) SpO2:  [95 %-99 %] 95 % (05/11 0715)  Intake/Output from previous day: 05/10 0701 - 05/11 0700 In: 3120 [P.O.:720; I.V.:1800; IV Piggyback:600] Out: 775 [Urine:775] Intake/Output from this shift: Total I/O In: -  Out: 400 [Urine:400]  Physical Exam: Neck: no adenopathy, no carotid bruit, no JVD and supple, symmetrical, trachea midline Lungs: Decreased breath sound at bases with her bilateral rhonchi Heart: irregularly irregular rhythm, S1, S2 normal and Soft systolic murmur noted Abdomen: soft, non-tender; bowel sounds normal; no masses,  no organomegaly Extremities: extremities normal, atraumatic, no cyanosis or edema and Left wrist swelling noted  Lab Results:  Basename 12/06/11 0531 12/05/11 1339  WBC 11.9* 12.0*  HGB 8.7* 8.4*  PLT 74* 65*    Basename 12/06/11 0531 12/05/11 1339  NA 131* 134*  K 3.9 3.9  CL 100 103  CO2 21 22  GLUCOSE 115* 144*  BUN 29* 35*  CREATININE 1.25 1.49*   No results found for this basename: TROPONINI:2,CK,MB:2 in the last 72 hours Hepatic Function Panel  Basename 12/03/11 2040  PROT 6.6  ALBUMIN 2.9*  AST 35  ALT 19  ALKPHOS 62  BILITOT 0.6  BILIDIR --  IBILI --   No results found for this basename: CHOL in the last 72 hours No results found for this basename: PROTIME in the last 72 hours  Imaging: Imaging results have been reviewed and Dg Chest 2 View  12/05/2011  *RADIOLOGY REPORT*  Clinical Data: Shortness of breath.  CHEST - 2 VIEW  Comparison: 12/03/2011  Findings: Dual lead transvenous  pacemaker again seen.  Heart size is stable.  Mild scarring seen in the lateral left lung base. Mild worsening asymmetric opacity is seen in the central right upper lobe, and right upper lobe pneumonia cannot be excluded.  No definite pleural effusion.  IMPRESSION: Question developing pneumonia in medial right upper lobe. Continued radiographic followup is recommended.  Original Report Authenticated By: Danae Orleans, M.D.    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

## 2011-12-06 NOTE — Evaluation (Signed)
Occupational Therapy Evaluation Patient Details Name: [name] MRN: [id number] DOB: 07/06/1927 Today's Date: 12/06/2011 Time: 6213-0865 OT Time Calculation (min): 32 min  OT Assessment / Plan / Recommendation Clinical Impression  Pt presents with PNA and s/p fall resulting in L wrist injury.  Pt with very high pain in wrist in back limiting mobility severly.  Unable to stand or transfer or A with any ADLs due to increased pain in back with mobility and requires +2 assist to sit EOB.  Pt will benefit from skilled OT in acute venue to address deficits in prep for d/c to next venue of care.    OT Assessment  Patient needs continued OT Services    Follow Up Recommendations  Skilled nursing facility    Barriers to Discharge Inaccessible home environment;Decreased caregiver support    Equipment Recommendations  Defer to next venue    Recommendations for Other Services Speech consult  Frequency  Min 1X/week    Precautions / Restrictions Precautions Precautions: Fall Restrictions Weight Bearing Restrictions: No Other Position/Activity Restrictions: Pt needs to keep LUE elevated on 2 pillows and ice as tolerated to decrease edema.   Pertinent Vitals/Pain     ADL  Where Assessed - Eating/Feeding:  (Per staff members  pt has been feeding himself with setup.) Grooming: Simulated;Maximal assistance Where Assessed - Grooming: Unsupported sitting ADL Comments: Eval consisted of just supine to sit and sit<>stand due to pt in so much pain. Pt extremely weak and debilitated.    OT Diagnosis: Generalized weakness  OT Problem List: Decreased strength;Decreased range of motion;Increased edema;Pain;Decreased knowledge of use of DME or AE;Impaired balance (sitting and/or standing);Decreased activity tolerance;Impaired vision/perception OT Treatment Interventions: Self-care/ADL training;Therapeutic activities;DME and/or AE instruction;Patient/family education;Balance training   OT Goals Acute  Rehab OT Goals OT Goal Formulation: With patient Time For Goal Achievement: 12/20/11 Potential to Achieve Goals: Fair ADL Goals Pt Will Perform Grooming: with set-up;Sitting, chair;Sitting, edge of bed;Supported;Unsupported ADL Goal: Grooming - Progress: Goal set today Pt Will Transfer to Toilet: with max assist;Squat pivot transfer;Stand pivot transfer;3-in-1 ADL Goal: Toilet Transfer - Progress: Goal set today Additional ADL Goal #1: Pt will roll R & L with min A to allow caregivers to provide peri care. Additional ADL Goal #2: Pt will tolerate sitting EOB with min A x 8 min in prep for seated ADL. Arm Goals Pt Will Perform AROM: with minimal assist;to decrease contractures;Left upper extremity;1 set;10 reps Arm Goal: AROM - Progress: Goal set today  Visit Information  Last OT Received On: 12/06/11 Assistance Needed: +2 PT/OT Co-Evaluation/Treatment: Yes    Subjective Data  Subjective: "Oh, Oh God it hurts!!!" Patient Stated Goal: Not asked   Prior Functioning  Home Living Lives With: Alone Type of Home: Apartment Home Access: Elevator Home Layout: One level Bathroom Shower/Tub: Health visitor: Standard Home Adaptive Equipment: Walker - rolling;Grab bars in shower;Shower chair with back;Bedside commode/3-in-1 Prior Function Level of Independence: Independent Able to Take Stairs?: No Driving: Yes Vocation: Retired Musician: Clinical cytogeneticist  Overall Cognitive Status: Appears within functional limits for tasks assessed/performed Arousal/Alertness: Awake/alert Orientation Level: Appears intact for tasks assessed Behavior During Session: [facility] for tasks performed    Extremity/Trunk Assessment Right Upper Extremity Assessment RUE ROM/Strength/Tone: Deficits RUE ROM/Strength/Tone Deficits: Pt with only ~60 degrees of shoulder flexion, WFL distally. Arthritic deformities noted in the wrist/hand. Strength ~3/5 at shoulder 3+/5  distally. Left Upper Extremity Assessment LUE ROM/Strength/Tone: Unable to fully assess;Due to pain;Deficits LUE ROM/Strength/Tone Deficits: Unable to  move pt's entire LUE without him crying out in pain. Hand and wrist extremely edemetous. Only able to tolerate elevation on 2 pillows. Right Lower Extremity Assessment RLE ROM/Strength/Tone: Unable to fully assess;Due to pain RLE Sensation: WFL - Light Touch RLE Coordination: WFL - gross motor Left Lower Extremity Assessment LLE ROM/Strength/Tone: Unable to fully assess;Due to pain LLE Sensation: WFL - Light Touch LLE Coordination: WFL - gross motor Trunk Assessment Trunk Assessment: Kyphotic;Other exceptions Trunk Exceptions: Pt also states that he has scoliosis and stenosis   Mobility Bed Mobility Bed Mobility: Supine to Sit;Sit to Supine Supine to Sit: 1: +2 Total assist;HOB elevated Supine to Sit: Patient Percentage: 10% Sit to Supine: 1: +2 Total assist;HOB flat Sit to Supine: Patient Percentage: 10% Details for Bed Mobility Assistance: Pt able to get B LE to EOB, however requires assist for B LE off of bed and for trunk to obtain sitting position.  Cues for hand placement on rails, however pt with very limited R UE ROM, therefore unable to provide assist until in fully upright position.  Transfers Sit to Stand: 1: +2 Total assist;From bed;From elevated surface Sit to Stand: Patient Percentage: 10% Stand to Sit: 1: +2 Total assist;To elevated surface;To bed Stand to Sit: Patient Percentage: 10% Details for Transfer Assistance: Pt able to somewhat bare weight through B LE, however, unable to attain standing position due to weakness and increased pain.     Exercise    Balance Balance Balance Assessed: Yes Static Sitting Balance Static Sitting - Balance Support: Right upper extremity supported;Feet supported Static Sitting - Level of Assistance: 1: +2 Total assist Static Sitting - Comment/# of Minutes: Pt sat EOB with +2 assist for  trunk and to maintain B LE on floor.  Provided cues for pt to use R UE to assist with trunk support.  Pt able to use recliner arm rest to maintain upright position at min/guard level.   End of Session OT - End of Session Equipment Utilized During Treatment: Gait belt Activity Tolerance: Patient limited by pain Patient left: in bed;with call bell/phone within reach Nurse Communication: Need for lift equipment   Prentiss Polio A OTR/L [phone] 12/06/2011, 11:47 AM

## 2011-12-06 NOTE — Procedures (Signed)
EEG NUMBER:  [id number].  This routine EEG was requested in this 78 year old man who was admitted with dehydration, confusion, falls, 2 episodes of speech difficulty.  He had a previous subdural hematoma.  MEDICATIONS:  No anticonvulsants.  The EEG was done with the patient awake and drowsy.  During periods of maximal wakefulness, background activities were composed of mixed frequency beta activities that were posteriorly dominant.  There is no clear reactivity to eye opening.  There was noted to be more prominent alpha activities at the midline.  These were of higher amplitude.  Photic stimulation produced a symmetric driving response. No hyperventilation was performed.  Drowsiness was marked by attenuation of muscle activity.  There were also bursts of slower activity as well as  attenuation of the amplitude of the background activities.  CLINICAL INTERPRETATION:  This routine EEG done with the patient awake and drowsy is abnormal.  Background activities are slightly too slow suggesting a mild-to-moderate encephalopathy of a nonspecific etiology. The prominence of alpha activities at the midline may suggest the breach rhythm secondary to a skull defect.  Clinical correlation is advised.          ______________________________ Denton Meek, MD    [id number] D:  12/05/2011 23:07:41  T:  12/05/2011 23:25:43  Job #:  [id number]

## 2011-12-06 NOTE — Progress Notes (Signed)
History: [name] is an 78 y.o. 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 After admission had an episode of difficulty with speech. Per report of the consulting physician the patient was having difficulty getting his words out. Episode resolved without intervention. Head CT was performed which showed chronic lacunar infarcts with no acute changes.   Subjective: No diploplia.  No complaints.   Objective: BP 93/51  Pulse 73  Temp(Src) 98 F (36.7 C) (Oral)  Resp 17  Ht 5\' 7"  (1.702 m)  Wt 75.6 kg (166 lb 10.7 oz)  BMI 26.10 kg/m2  SpO2 97%  CBGs  Basename 12/03/11 2032  GLUCAP 151*    Medications: Scheduled:   . aspirin  325 mg Oral Daily  . azithromycin  500 mg Intravenous Q24H  . B-complex with vitamin C  1 tablet Oral q morning - 10a  . carvedilol  3.125 mg Oral BID AC  . cefTRIAXone (ROCEPHIN)  IV  1 g Intravenous Q24H  . digoxin  125 mcg Oral QHS  . finasteride  5 mg Oral q morning - 10a  . folic acid  1 mg Oral Daily  . heparin subcutaneous  5,000 Units Subcutaneous Q8H  . mulitivitamin with minerals  1 tablet Oral BID  . omega-3 acid ethyl esters  1 g Oral Daily  . simvastatin  10 mg Oral q1800  . Tamsulosin HCl  0.8 mg Oral q morning - 10a  . thiamine  100 mg Oral Daily  . vancomycin  750 mg Intravenous Q12H   Neurologic Exam: Mental Status: Alert, oriented, thought content appropriate.  Speech fluent without evidence of aphasia. Able to follow 3 step commands without difficulty. Cranial Nerves: II- Visual fields grossly intact. III/IV/VI-Extraocular movements intact.  Pupils reactive bilaterally. V/VII-Smile symmetric VIII-hearing grossly intact IX/X-normal gag XI-bilateral shoulder shrug XII-midline tongue extension Motor: 5/5 bilaterally with normal tone and bulk, except wasting R  hand.  Left arm has limited ROM due to old injury. Sensory: Light touch intact throughout, bilaterally Primitive Reflexes:+ Meyer's sigh.    Lab Results: CBC:  Lab 12/06/11 0531 12/05/11 1339 12/03/11 2040 12/03/11 1413  WBC 11.9* 12.0* -- --  NEUTROABS -- -- 17.7* 19.7*  HGB 8.7* 8.4* -- --  HCT 25.6* 24.5* -- --  MCV 92.8 92.5 -- --  PLT 74* 65* -- --   Basic Metabolic Panel:  Lab 12/06/11 4540 12/05/11 1339 12/03/11 2040  NA 131* 134* --  K 3.9 3.9 --  CL 100 103 --  CO2 21 22 --  GLUCOSE 115* 144* --  BUN 29* 35* --  CREATININE 1.25 1.49* --  CALCIUM 8.1* 7.7* --  MG -- 1.9 1.6  PHOS -- -- --   Liver Function Tests:  Lab 12/03/11 2040  AST 35  ALT 19  ALKPHOS 62  BILITOT 0.6  PROT 6.6  ALBUMIN 2.9*   Thyroid Function Tests:  Lab 12/03/11 2040  TSH 1.813  T4TOTAL --  FREET4 --  T3FREE --  THYROIDAB --   Study Results: 12/03/11 CT HEAD WITHOUT CONTRAST  Findings: There is no evidence of acute infarction, mass lesion, or intra- or extra-axial hemorrhage on CT. Increased density anterior to the brainstem is thought to reflect beam hardening artifact. Evaluation is suboptimal due to motion artifact and machine artifact. Chronic lacunar infarcts are seen within the right thalamus, right cerebral peduncle and left  basal ganglia. The posterior fossa, including the cerebellum, brainstem and fourth ventricle, is within normal limits. The third and lateral ventricles are unremarkable in appearance. The cerebral hemispheres demonstrate grossly normal gray-white differentiation. No mass effect or midline shift is seen. There is no evidence of fracture; visualized osseous structures are unremarkable in appearance. The orbits are within normal limits. There is mild partial opacification of the right mastoid air cells. The paranasal sinuses and left mastoid air cells are well-aerated. No significant soft tissue abnormalities are seen.  IMPRESSION:  1. No acute intracranial  pathology seen on CT.  2. Chronic lacunar infarcts within the right thalamus, right  cerebral peduncle and left basal ganglia.  3. Mild partial opacification of the right mastoid air cells.   Tonia Ghent, M.D.  12/05/2011   CHEST - 2 VIEW   Findings: Dual lead transvenous pacemaker again seen.  Heart size is stable.  Mild scarring seen in the lateral left lung base. Mild worsening asymmetric opacity is seen in the central right upper lobe, and right upper lobe pneumonia cannot be excluded.  No definite pleural effusion.  IMPRESSION: Question developing pneumonia in medial right upper lobe. Continued radiographic followup is recommended.   Danae Orleans, M.D.   12/05/11 EEG CLINICAL INTERPRETATION: This routine EEG done with the patient awake and drowsy is abnormal. Background activities are slightly too slow suggesting a mild-to-moderate encephalopathy with nonspecific etiology.  The prominence of alpha activities at the midline may suggest the breach rhythm secondary to a skull defect.   Assessment: EEG compatible with mild cognitive impairment.  No seizure activity identified.  Do not suspect seizure or other acute neurological process.    Plan:  Recommend outpatient follow up with Dr. Terrace Arabia.  Will need work up of memory loss and  right hand weakness/muscle atrophy, if not done already. No need for further neuro work up at this time.  Will sign off.  Call with questions.  Patient seen and examined by Dr. Elita Boone who recommends above.  LOS: 3 days   Marya Fossa [name] [facility] [phone] 12/06/2011  3:04 PM

## 2011-12-06 NOTE — Evaluation (Signed)
Physical Therapy Evaluation Patient Details Name: [name] MRN: [id number] DOB: 06-Mar-1927 Today's Date: 12/06/2011 Time: 1191-4782 PT Time Calculation (min): 34 min  PT Assessment / Plan / Recommendation Clinical Impression  Pt presents with PNA and s/p fall resulting in L wrist injury.  Pt with very high pain in wrist in back limiting mobility severly.  Unable to stand or transfer due to increased pain in back with mobility and requires +2 assist to sit EOB.  Pt will benefit from skilled PT in acute venue to address deficits.  PT recommends SNF for follow up therapy at D/C to increase pt safety and return to to PLOF.    PT Assessment  Patient needs continued PT services    Follow Up Recommendations  Skilled nursing facility    Barriers to Discharge Decreased caregiver support      lEquipment Recommendations  Defer to next venue    Recommendations for Other Services OT consult (already on board.)   Frequency Min 3X/week    Precautions / Restrictions Precautions Precautions: Fall Restrictions Weight Bearing Restrictions: No   Pertinent Vitals/Pain 9/10 pain in back with mobility       Mobility  Bed Mobility Bed Mobility: Supine to Sit;Sit to Supine Supine to Sit: 1: +2 Total assist;HOB elevated Supine to Sit: Patient Percentage: 10% Sit to Supine: 1: +2 Total assist;HOB flat Sit to Supine: Patient Percentage: 10% Details for Bed Mobility Assistance: Pt able to get B LE to EOB, however requires assist for B LE off of bed and for trunk to obtain sitting position.  Cues for hand placement on rails, however pt with very limited R UE ROM, therefore unable to provide assist until in fully upright position.  Transfers Transfers: Sit to Stand;Stand to Sit Sit to Stand: 1: +2 Total assist;From bed;From elevated surface Sit to Stand: Patient Percentage: 10% Stand to Sit: 1: +2 Total assist;To elevated surface;To bed Stand to Sit: Patient Percentage: 10% Details for Transfer  Assistance: Pt able to somewhat bare weight through B LE, however, unable to attain standing position due to weakness and increased pain.   Ambulation/Gait Ambulation/Gait Assistance: Not tested (comment) General Gait Details: Will likely need L platform RW if pt able to get back pain under control.   Stairs: No Wheelchair Mobility Wheelchair Mobility: No    Exercises     PT Diagnosis: Generalized weakness;Difficulty walking;Acute pain  PT Problem List: Decreased strength;Decreased range of motion;Decreased activity tolerance;Decreased balance;Decreased mobility;Decreased knowledge of use of DME;Pain PT Treatment Interventions: DME instruction;Gait training;Functional mobility training;Therapeutic activities;Therapeutic exercise;Balance training;Patient/family education   PT Goals Acute Rehab PT Goals PT Goal Formulation: With patient Time For Goal Achievement: 12/20/11 Potential to Achieve Goals: Fair Pt will go Supine/Side to Sit: with mod assist PT Goal: Supine/Side to Sit - Progress: Goal set today Pt will go Sit to Supine/Side: with mod assist PT Goal: Sit to Supine/Side - Progress: Goal set today Pt will go Sit to Stand: with mod assist PT Goal: Sit to Stand - Progress: Goal set today Pt will go Stand to Sit: with mod assist PT Goal: Stand to Sit - Progress: Goal set today Pt will Transfer Bed to Chair/Chair to Bed: with mod assist PT Transfer Goal: Bed to Chair/Chair to Bed - Progress: Goal set today Pt will Ambulate: 1 - 15 feet;with mod assist;with least restrictive assistive device PT Goal: Ambulate - Progress: Goal set today  Visit Information  Last PT Received On: 12/06/11 Assistance Needed: +2 PT/OT Co-Evaluation/Treatment: Yes  Subjective Data  Subjective: i'm willing to try to get up Patient Stated Goal: to get better   Prior Functioning  Home Living Lives With: Alone Type of Home: Apartment Home Access: Elevator Home Layout: One level Bathroom  Shower/Tub: Health visitor: Standard Home Adaptive Equipment: Walker - rolling;Grab bars in shower;Shower chair with back;Bedside commode/3-in-1 Prior Function Level of Independence: Independent Able to Take Stairs?: No Driving: Yes Vocation: Retired Musician: Clinical cytogeneticist  Overall Cognitive Status: Appears within functional limits for tasks assessed/performed Arousal/Alertness: Awake/alert Orientation Level: Appears intact for tasks assessed Behavior During Session: [facility] for tasks performed    Extremity/Trunk Assessment Right Lower Extremity Assessment RLE ROM/Strength/Tone: Unable to fully assess;Due to pain RLE Sensation: WFL - Light Touch RLE Coordination: WFL - gross motor Left Lower Extremity Assessment LLE ROM/Strength/Tone: Unable to fully assess;Due to pain LLE Sensation: WFL - Light Touch LLE Coordination: WFL - gross motor Trunk Assessment Trunk Assessment: Kyphotic;Other exceptions Trunk Exceptions: Pt also states that he has scoliosis and stenosis   Balance Balance Balance Assessed: Yes Static Sitting Balance Static Sitting - Balance Support: Right upper extremity supported;Feet supported Static Sitting - Level of Assistance: 1: +2 Total assist Static Sitting - Comment/# of Minutes: Pt sat EOB with +2 assist for trunk and to maintain B LE on floor.  Provided cues for pt to use R UE to assist with trunk support.  Pt able to use recliner arm rest to maintain upright position at min/guard level.   End of Session PT - End of Session Equipment Utilized During Treatment: Gait belt Activity Tolerance: Patient limited by pain Patient left: in bed;with call bell/phone within reach Nurse Communication: Mobility status   Page, Meribeth Mattes 12/06/2011, 10:54 AM

## 2011-12-07 ENCOUNTER — Inpatient Hospital Stay (HOSPITAL_COMMUNITY): Payer: Medicare Other | Admitting: Anesthesiology

## 2011-12-07 ENCOUNTER — Encounter (HOSPITAL_COMMUNITY): Payer: Self-pay | Admitting: Anesthesiology

## 2011-12-07 ENCOUNTER — Encounter (HOSPITAL_COMMUNITY)
Admission: EM | Disposition: A | Discharge status: Skilled Nursing Facility | Payer: Self-pay | Source: Home / Self Care | Attending: Cardiology

## 2011-12-07 HISTORY — PX: I & D EXTREMITY: SHX5045

## 2011-12-07 LAB — CULTURE, RESPIRATORY W GRAM STAIN: Culture: NORMAL

## 2011-12-07 LAB — URIC ACID: Uric Acid, Serum: 6.5 mg/dL (ref 4.0–7.8)

## 2011-12-07 LAB — MRSA PCR SCREENING: MRSA by PCR: NEGATIVE

## 2011-12-07 LAB — SYNOVIAL FLUID, CRYSTAL: Crystals, Fluid: NONE SEEN

## 2011-12-07 SURGERY — IRRIGATION AND DEBRIDEMENT EXTREMITY
Anesthesia: General | Site: Wrist | Laterality: Left | Wound class: Dirty or Infected

## 2011-12-07 MED ORDER — MORPHINE SULFATE 2 MG/ML IJ SOLN
1.0000 mg | INTRAMUSCULAR | Status: DC | PRN
  Administered 2011-12-09 – 2011-12-10 (×3): 1 mg via INTRAVENOUS
  Filled 2011-12-07 (×3): qty 1

## 2011-12-07 MED ORDER — 0.9 % SODIUM CHLORIDE (POUR BTL) OPTIME
TOPICAL | Status: DC | PRN
  Administered 2011-12-07: 3000 mL

## 2011-12-07 MED ORDER — SUCCINYLCHOLINE CHLORIDE 20 MG/ML IJ SOLN
INTRAMUSCULAR | Status: DC | PRN
  Administered 2011-12-07: 100 mg via INTRAVENOUS

## 2011-12-07 MED ORDER — OXYCODONE-ACETAMINOPHEN 5-325 MG PO TABS
1.0000 | ORAL_TABLET | ORAL | Status: DC | PRN
  Administered 2011-12-07: 2 via ORAL
  Administered 2011-12-07: 1 via ORAL
  Administered 2011-12-08 – 2011-12-12 (×8): 2 via ORAL
  Filled 2011-12-07: qty 2
  Filled 2011-12-07: qty 1
  Filled 2011-12-07 (×9): qty 2

## 2011-12-07 MED ORDER — ONDANSETRON HCL 4 MG/2ML IJ SOLN
INTRAMUSCULAR | Status: DC | PRN
  Administered 2011-12-07: 4 mg via INTRAVENOUS

## 2011-12-07 MED ORDER — METOCLOPRAMIDE HCL 5 MG/ML IJ SOLN: 5.0000 mg | Freq: Three times a day (TID) | INTRAMUSCULAR | Status: DC | PRN

## 2011-12-07 MED ORDER — LACTATED RINGERS IV SOLN: INTRAVENOUS | Status: DC

## 2011-12-07 MED ORDER — METOCLOPRAMIDE HCL 5 MG PO TABS
5.0000 mg | ORAL_TABLET | Freq: Three times a day (TID) | ORAL | Status: DC | PRN
  Filled 2011-12-07: qty 2

## 2011-12-07 MED ORDER — FENTANYL CITRATE 0.05 MG/ML IJ SOLN
INTRAMUSCULAR | Status: DC | PRN
  Administered 2011-12-07: 100 ug via INTRAVENOUS
  Administered 2011-12-07 (×4): 50 ug via INTRAVENOUS

## 2011-12-07 MED ORDER — BUPIVACAINE HCL 0.25 % IJ SOLN
INTRAMUSCULAR | Status: DC | PRN
  Administered 2011-12-07: 10 mL

## 2011-12-07 MED ORDER — ETOMIDATE 2 MG/ML IV SOLN
INTRAVENOUS | Status: DC | PRN
  Administered 2011-12-07: 14 mg via INTRAVENOUS

## 2011-12-07 MED ORDER — ONDANSETRON HCL 4 MG PO TABS
4.0000 mg | ORAL_TABLET | Freq: Four times a day (QID) | ORAL | Status: DC | PRN
  Filled 2011-12-07: qty 1

## 2011-12-07 MED ORDER — HYDROMORPHONE HCL PF 1 MG/ML IJ SOLN: 0.2500 mg | INTRAMUSCULAR | Status: DC | PRN

## 2011-12-07 MED ORDER — ONDANSETRON HCL 4 MG/2ML IJ SOLN: 4.0000 mg | Freq: Four times a day (QID) | INTRAMUSCULAR | Status: DC | PRN

## 2011-12-07 MED ORDER — LACTATED RINGERS IV SOLN
INTRAVENOUS | Status: DC | PRN
  Administered 2011-12-07: 11:00:00 via INTRAVENOUS

## 2011-12-07 SURGICAL SUPPLY — 40 items
BAG ZIPLOCK 12X15 (MISCELLANEOUS) ×2 IMPLANT
BANDAGE ELASTIC 3 VELCRO ST LF (GAUZE/BANDAGES/DRESSINGS) ×2 IMPLANT
BANDAGE ELASTIC 4 VELCRO ST LF (GAUZE/BANDAGES/DRESSINGS) ×2 IMPLANT
BANDAGE GAUZE ELAST BULKY 4 IN (GAUZE/BANDAGES/DRESSINGS) ×2 IMPLANT
BNDG COHESIVE 4X5 TAN STRL (GAUZE/BANDAGES/DRESSINGS) IMPLANT
CANISTER SUCTION 2500CC (MISCELLANEOUS) IMPLANT
CLEANER TIP ELECTROSURG 2X2 (MISCELLANEOUS) IMPLANT
CLOTH BEACON ORANGE TIMEOUT ST (SAFETY) ×2 IMPLANT
CORDS BIPOLAR (ELECTRODE) ×2 IMPLANT
CUFF TOURN SGL QUICK 18 (TOURNIQUET CUFF) ×2 IMPLANT
CUFF TOURN SGL QUICK 24 (TOURNIQUET CUFF)
CUFF TRNQT CYL 24X4X40X1 (TOURNIQUET CUFF) IMPLANT
DRAIN PENROSE 18X1/2 LTX STRL (DRAIN) IMPLANT
DRSG PAD ABDOMINAL 8X10 ST (GAUZE/BANDAGES/DRESSINGS) ×2 IMPLANT
ELECT REM PT RETURN 9FT ADLT (ELECTROSURGICAL) ×2
ELECTRODE REM PT RTRN 9FT ADLT (ELECTROSURGICAL) ×1 IMPLANT
GAUZE PACKING IODOFORM 1/2 (PACKING) IMPLANT
GAUZE PACKING IODOFORM 1/4X5 (PACKING) ×2 IMPLANT
GAUZE XEROFORM 1X8 LF (GAUZE/BANDAGES/DRESSINGS) ×2 IMPLANT
GAUZE XEROFORM 5X9 LF (GAUZE/BANDAGES/DRESSINGS) IMPLANT
GLOVE BIO SURGEON STRL SZ7.5 (GLOVE) ×2 IMPLANT
GLOVE BIOGEL PI IND STRL 8 (GLOVE) ×1 IMPLANT
GLOVE BIOGEL PI INDICATOR 8 (GLOVE) ×1
GOWN STRL REIN XL XLG (GOWN DISPOSABLE) ×2 IMPLANT
HANDPIECE INTERPULSE COAX TIP (DISPOSABLE)
KIT BASIN OR (CUSTOM PROCEDURE TRAY) ×2 IMPLANT
MANIFOLD NEPTUNE II (INSTRUMENTS) ×2 IMPLANT
NEEDLE HYPO 22GX1.5 SAFETY (NEEDLE) ×2 IMPLANT
PACK LOWER EXTREMITY WL (CUSTOM PROCEDURE TRAY) ×2 IMPLANT
PAD CAST 4YDX4 CTTN HI CHSV (CAST SUPPLIES) IMPLANT
PADDING CAST COTTON 4X4 STRL (CAST SUPPLIES)
SET CYSTO W/LG BORE CLAMP LF (SET/KITS/TRAYS/PACK) ×2 IMPLANT
SET HNDPC FAN SPRY TIP SCT (DISPOSABLE) IMPLANT
SPONGE GAUZE 4X4 12PLY (GAUZE/BANDAGES/DRESSINGS) ×2 IMPLANT
SUT ETHILON 4 0 PS 2 18 (SUTURE) IMPLANT
SUT SILK 4 0 PS 2 (SUTURE) ×4 IMPLANT
SYR 20CC LL (SYRINGE) IMPLANT
SYR 3ML LL SCALE MARK (SYRINGE) ×2 IMPLANT
SYR CONTROL 10ML LL (SYRINGE) ×2 IMPLANT
VESSEL CANN W0 1 W VA 30003 (MISCELLANEOUS) ×2 IMPLANT

## 2011-12-07 NOTE — Preoperative (Signed)
Beta Blockers   Reason not to administer Beta Blockers:Received Coreg this am.

## 2011-12-07 NOTE — Anesthesia Preprocedure Evaluation (Addendum)
Anesthesia Evaluation  Patient identified by MRN, date of birth, ID band Patient awake  General Assessment Comment:NPO today Advanced yrs Increased CV risk  Reviewed: Allergy & Precautions, H&P , NPO status , Patient's Chart, lab work & pertinent test results, reviewed documented beta blocker date and time   Airway Mallampati: II TM Distance: >3 FB Neck ROM: Limited    Dental  (+) Partial Upper, Dental Advisory Given and Poor Dentition   Pulmonary neg pulmonary ROS,  breath sounds clear to auscultation  + decreased breath sounds      Cardiovascular hypertension, Pt. on medications +CHF + dysrhythmias Atrial Fibrillation + pacemaker Rhythm:Irregular Rate:Normal  A Fib/pacemaker Has been off coumadin 3 wks   Neuro/Psych CT shows evidence of previous infarcts negative psych ROS   GI/Hepatic negative GI ROS, Neg liver ROS,   Endo/Other  negative endocrine ROS  Renal/GU Mildly increased Cr   negative genitourinary   Musculoskeletal negative musculoskeletal ROS (+)   Abdominal   Peds negative pediatric ROS (+)  Hematology Anemia, Hgb 8.7   Anesthesia Other Findings   Reproductive/Obstetrics negative OB ROS                         Anesthesia Physical Anesthesia Plan  ASA: III  Anesthesia Plan: General   Post-op Pain Management:    Induction: Intravenous and Cricoid pressure planned  Airway Management Planned: Oral ETT  Additional Equipment:   Intra-op Plan:   Post-operative Plan: Extubation in OR  Informed Consent: I have reviewed the patients History and Physical, chart, labs and discussed the procedure including the risks, benefits and alternatives for the proposed anesthesia with the patient or authorized representative who has indicated his/her understanding and acceptance.   Dental advisory given  Plan Discussed with: CRNA and Surgeon  Anesthesia Plan Comments:          Anesthesia Quick Evaluation

## 2011-12-07 NOTE — Op Note (Signed)
Dictation [phone]

## 2011-12-07 NOTE — Progress Notes (Signed)
Subjective: Continued left wrist pain.  Splint helps.     Objective: Vital signs in last 24 hours: Temp:  [98 F (36.7 C)-98.9 F (37.2 C)] 98 F (36.7 C) (05/12 0540) Pulse Rate:  [71-83] 83  (05/12 0810) Resp:  [17-18] 18  (05/12 0540) BP: (93-122)/(51-67) 122/67 mmHg (05/12 0810) SpO2:  [96 %-97 %] 96 % (05/12 0540)  Intake/Output from previous day: 05/11 0701 - 05/12 0700 In: 2422.5 [P.O.:600; I.V.:1222.5; IV Piggyback:600] Out: 1400 [Urine:1400] Intake/Output this shift: Total I/O In: 0  Out: 500 [Urine:500]   Basename 12/06/11 0531 12/05/11 1339  HGB 8.7* 8.4*    Basename 12/06/11 0531 12/05/11 1339  WBC 11.9* 12.0*  RBC 2.76* 2.65*  HCT 25.6* 24.5*  PLT 74* 65*    Basename 12/06/11 0531 12/05/11 1339  NA 131* 134*  K 3.9 3.9  CL 100 103  CO2 21 22  BUN 29* 35*  CREATININE 1.25 1.49*  GLUCOSE 115* 144*  CALCIUM 8.1* 7.7*   No results found for this basename: LABPT:2,INR:2 in the last 72 hours  left hand with intact sensation and capillary refill.  wiggles fingers.  wrist tender and swollen.  mild erythema.  no streaking.  Assessment/Plan: Wrist aspirate with no crystals seen, WBC 49k with 94% pmn, Cx's pending.  recommend OR for I&D of left wrist.  Risks, benefits, and alternatives of surgery were discussed and the patient agrees with the plan of care.    Chari Parmenter R 12/07/2011, 10:51 AM

## 2011-12-07 NOTE — Progress Notes (Signed)
Subjective:  Patient denies any chest pain or shortness of breath States wrist pain has slightly improved swelling persist. Left wrist  fluid showed large wbc's but no crystals. Patient being taken to or for incision and drainage for possible septic arthritis  Objective:  Vital Signs in the last 24 hours: Temp:  [98 F (36.7 C)-98.9 F (37.2 C)] 98 F (36.7 C) (05/12 0540) Pulse Rate:  [71-83] 83  (05/12 0810) Resp:  [17-18] 18  (05/12 0540) BP: (93-122)/(51-67) 122/67 mmHg (05/12 0810) SpO2:  [96 %-97 %] 96 % (05/12 0540)  Intake/Output from previous day: 05/11 0701 - 05/12 0700 In: 2422.5 [P.O.:600; I.V.:1222.5; IV Piggyback:600] Out: 1400 [Urine:1400] Intake/Output from this shift: Total I/O In: 0  Out: 500 [Urine:500]  Physical Exam: Neck: no adenopathy, no carotid bruit, no JVD and supple, symmetrical, trachea midline Lungs: Decreased breath sounds with occasional rhonchi Heart: irregularly irregular rhythm, S1, S2 normal and Soft systolic murmur noted Abdomen: soft, non-tender; bowel sounds normal; no masses,  no organomegaly Extremities: extremities normal, atraumatic, no cyanosis or edema  Lab Results:  Basename 12/06/11 0531 12/05/11 1339  WBC 11.9* 12.0*  HGB 8.7* 8.4*  PLT 74* 65*    Basename 12/06/11 0531 12/05/11 1339  NA 131* 134*  K 3.9 3.9  CL 100 103  CO2 21 22  GLUCOSE 115* 144*  BUN 29* 35*  CREATININE 1.25 1.49*   No results found for this basename: TROPONINI:2,CK,MB:2 in the last 72 hours Hepatic Function Panel No results found for this basename: PROT,ALBUMIN,AST,ALT,ALKPHOS,BILITOT,BILIDIR,IBILI in the last 72 hours No results found for this basename: CHOL in the last 72 hours No results found for this basename: PROTIME in the last 72 hours  Imaging: Imaging results have been reviewed and Dg Chest 2 View  12/05/2011  *RADIOLOGY REPORT*  Clinical Data: Shortness of breath.  CHEST - 2 VIEW  Comparison: 12/03/2011  Findings: Dual lead  transvenous pacemaker again seen.  Heart size is stable.  Mild scarring seen in the lateral left lung base. Mild worsening asymmetric opacity is seen in the central right upper lobe, and right upper lobe pneumonia cannot be excluded.  No definite pleural effusion.  IMPRESSION: Question developing pneumonia in medial right upper lobe. Continued radiographic followup is recommended.  Original Report Authenticated By: Danae Orleans, M.D.    Cardiac Studies:  Assessment/Plan: Possible left wrist septic arthritis Status post probable TIA /metabolic encephalopathy  Resolving pneumonia  Gram-positive bacteremia  Leukocytosis improved  Status post fall /torn scapholunate ligament  Chronic atrial fibrillation  History of right cerebral peduncle/midbrain/subarachnoid hemorrhage in the past  Ischemic cardiomyopathy  Hypercholesteremia  Chronic kidney disease stage III improved  Anemia of chronic disease  6 sinus syndrome status post permanent pacemaker  Cerebrovascular disease  Degenerative joint disease  Anemia stable Thrombocytopenia  Plan DC heparin Continue present antibiotics Schedule for incision and drainage of left wrist Check labs in a.m.  LOS: 4 days    [name] 12/07/2011, 10:50 AM

## 2011-12-07 NOTE — Anesthesia Postprocedure Evaluation (Signed)
  Anesthesia Post-op Note  Patient: [name]  Procedure(s) Performed: Procedure(s) (LRB): IRRIGATION AND DEBRIDEMENT EXTREMITY (Left)  Patient Location: PACU  Anesthesia Type: General  Level of Consciousness: oriented and sedated  Airway and Oxygen Therapy: Patient Spontanous Breathing and Patient connected to nasal cannula oxygen  Post-op Pain: mild  Post-op Assessment: Post-op Vital signs reviewed, Patient's Cardiovascular Status Stable, Respiratory Function Stable and Patent Airway  Post-op Vital Signs: stable  Complications: No apparent anesthesia complications

## 2011-12-07 NOTE — Transfer of Care (Signed)
Immediate Anesthesia Transfer of Care Note  Patient: [name]  Procedure(s) Performed: Procedure(s) (LRB): IRRIGATION AND DEBRIDEMENT EXTREMITY (Left)  Patient Location: PACU  Anesthesia Type: General  Level of Consciousness: awake, oriented and patient cooperative  Airway & Oxygen Therapy: Patient Spontanous Breathing and Patient connected to face mask oxygen  Post-op Assessment: Report given to PACU RN and Post -op Vital signs reviewed and stable  Post vital signs: Reviewed and stable  Complications: No apparent anesthesia complications

## 2011-12-08 LAB — VANCOMYCIN, TROUGH: Vancomycin Tr: 10 ug/mL (ref 10.0–20.0)

## 2011-12-08 LAB — CBC
HCT: 25.9 % — ABNORMAL LOW (ref 39.0–52.0)
Hemoglobin: 8.8 g/dL — ABNORMAL LOW (ref 13.0–17.0)
MCH: 31.4 pg (ref 26.0–34.0)
MCHC: 34 g/dL (ref 30.0–36.0)
MCV: 92.5 fL (ref 78.0–100.0)
Platelets: 121 10*3/uL — ABNORMAL LOW (ref 150–400)
RBC: 2.8 MIL/uL — ABNORMAL LOW (ref 4.22–5.81)
RDW: 14.1 % (ref 11.5–15.5)
WBC: 9.7 10*3/uL (ref 4.0–10.5)

## 2011-12-08 LAB — BASIC METABOLIC PANEL
BUN: 17 mg/dL (ref 6–23)
CO2: 23 mEq/L (ref 19–32)
Calcium: 7.9 mg/dL — ABNORMAL LOW (ref 8.4–10.5)
Chloride: 102 mEq/L (ref 96–112)
Creatinine, Ser: 1.05 mg/dL (ref 0.50–1.35)
GFR calc Af Amer: 74 mL/min — ABNORMAL LOW (ref >90)
GFR calc non Af Amer: 64 mL/min — ABNORMAL LOW (ref >90)
Glucose, Bld: 138 mg/dL — ABNORMAL HIGH (ref 70–99)
Potassium: 3.5 mEq/L (ref 3.5–5.1)
Sodium: 134 mEq/L — ABNORMAL LOW (ref 135–145)

## 2011-12-08 MED ORDER — VANCOMYCIN HCL 1000 MG IV SOLR
1250.0000 mg | Freq: Two times a day (BID) | INTRAVENOUS | Status: DC
  Administered 2011-12-08 – 2011-12-10 (×4): 1250 mg via INTRAVENOUS
  Filled 2011-12-08 (×7): qty 1250

## 2011-12-08 NOTE — Progress Notes (Signed)
Physical Therapy Treatment Patient Details Name: [name] MRN: [id number] DOB: 1926/10/01 Today's Date: 12/08/2011 Time: 4098-1191 PT Time Calculation (min): 9 min  PT Assessment / Plan / Recommendation Comments on Treatment Session  Pt declined OOB this pm due to difficulty and increased pain with nursing. Pt was agreeable to bed exercises. Explained to pt that we need to continue to attempt OOB/mobility and that we may try to coordinate PT with pain medication administration in future. Noted pt had L UE resting on one pillow. Encourged pt to try to keep L UE elevated as much as tolerated throughout day.  Pt stated he could not tolerate the elevation for long periods so he is alternating position.     Follow Up Recommendations  Skilled nursing facility    Barriers to Discharge        Equipment Recommendations  Defer to next venue    Recommendations for Other Services    Frequency Min 3X/week   Plan Discharge plan remains appropriate    Precautions / Restrictions Precautions Precautions: Fall Restrictions Other Position/Activity Restrictions: L UE elevated-[place] sling on IV pole.   Pertinent Vitals/Pain     Mobility       Exercises General Exercises - Lower Extremity Ankle Circles/Pumps: AROM;Both;20 reps;Supine Quad Sets: AROM;Both;20 reps;Supine Heel Slides: AROM;Both;20 reps;Supine;AAROM (AA R LE) Hip ABduction/ADduction: AROM;Both;20 reps;Supine;AAROM (AA R LE)   PT Diagnosis:    PT Problem List:   PT Treatment Interventions:     PT Goals Acute Rehab PT Goals Pt will Perform Home Exercise Program: with supervision, verbal cues required/provided PT Goal: Perform Home Exercise Program - Progress: Goal set today  Visit Information  Last PT Received On: 12/08/11 Assistance Needed: +2    Subjective Data  Subjective: "I can't tolerate keeping my arm up for a long time"   Cognition  Overall Cognitive Status: Appears within functional limits for tasks  assessed/performed Arousal/Alertness: Awake/alert Orientation Level: Appears intact for tasks assessed Behavior During Session: [facility] for tasks performed    Balance     End of Session PT - End of Session Activity Tolerance: Patient tolerated treatment well Patient left: in bed;with call bell/phone within reach    [name] [facility] 12/08/2011, 3:38 PM [phone]

## 2011-12-08 NOTE — Progress Notes (Addendum)
ANTIBIOTIC CONSULT NOTE - Follow Up  Pharmacy Consult for Vancomycin Indication:  Group A Strep blood cxt, septic arthritis  Allergies  Allergen Reactions  . Amiodarone     REACTION: fluid on lungs    Patient Measurements: Height: 5\' 7"  (170.2 cm) Weight: 166 lb 10.7 oz (75.6 kg) IBW/kg (Calculated) : 66.1   Vital Signs: Temp: 98 F (36.7 C) (05/13 0450) Temp src: Oral (05/13 0450) BP: 120/58 mmHg (05/13 0902) Pulse Rate: 71  (05/13 1130)  Labs:  Basename 12/08/11 0540 12/06/11 0531 12/05/11 1339  WBC 9.7 11.9* 12.0*  HGB 8.8* 8.7* 8.4*  PLT 121* 74* 65*  LABCREA -- -- --  CREATININE 1.05 1.25 1.49*   Estimated Creatinine Clearance: 49.8 ml/min (by C-G formula based on Cr of 1.05). No results found for this basename: VANCOTROUGH:2,VANCOPEAK:2,VANCORANDOM:2,GENTTROUGH:2,GENTPEAK:2,GENTRANDOM:2,TOBRATROUGH:2,TOBRAPEAK:2,TOBRARND:2,AMIKACINPEAK:2,AMIKACINTROU:2,AMIKACIN:2, in the last 72 hours   Microbiology: 5/8 blood: 2/2 GAS -Strep pyogenes (pending sens)  5/8 urine: NGF 5/9 sputum: normal flora 5/1 synocial aspirate: NGTD   Medications:  Anti-infectives     Start     Dose/Rate Route Frequency Ordered Stop   12/04/11 2000   vancomycin (VANCOCIN) 750 mg in sodium chloride 0.9 % 150 mL IVPB        750 mg 150 mL/hr over 60 Minutes Intravenous Every 12 hours 12/04/11 1850     12/03/11 2200   azithromycin (ZITHROMAX) 500 mg in dextrose 5 % 250 mL IVPB        500 mg 250 mL/hr over 60 Minutes Intravenous Every 24 hours 12/03/11 2007 12/10/11 2159   12/03/11 2100   cefTRIAXone (ROCEPHIN) 1 g in dextrose 5 % 50 mL IVPB        1 g 100 mL/hr over 30 Minutes Intravenous Every 24 hours 12/03/11 2007 12/10/11 2059         Assessment:  78 yo male admit for treatment for CAP, septic arthritis with + blood cultures  Day #6 Rocephin, Azithromycin, Day #5 Vancomycin  Renal function improving, CrCl ~ 50 ml/min today  Goal of Therapy:  Vancomycin trough level 15-20  mcg/ml  Plan:   Continue Vancomycin 750mg  IV q12h  Follow up labs, cultures results (sensitivity), renal function.  Vancomycin trough at steady state today  Lynann Beaver PharmD, BCPS Pager [phone] 12/08/2011 11:44 AM   ADDENDUM: 12/08/2011 7:18 PM Vancomycin trough returned below goal at 10 on 750 mg IV q12h.  Scr improving. Plan: Adjust vancomycin to 1250 mg IV q12h.    Clance Boll, PharmD, BCPS Pager: [phone]

## 2011-12-08 NOTE — Op Note (Signed)
[name], [name] NO.:  [id number]  MEDICAL RECORD NO.:  [id number]  LOCATION:  1438                         FACILITY:  [facility]  PHYSICIAN:  Betha Loa, MD        DATE OF BIRTH:  September 01, 1926  DATE OF PROCEDURE:  12/07/2011 DATE OF DISCHARGE:                              OPERATIVE REPORT   PREOPERATIVE DIAGNOSIS:  Left wrist septic arthritis.  POSTOPERATIVE DIAGNOSIS:  Left wrist septic arthritis.  PROCEDURE:  Irrigation debridement of left wrist.  SURGEON:  Betha Loa, MD.  ASSISTANT:  None.  ANESTHESIA:  General.  IV FLUIDS:  Per anesthesia flow sheet.  ESTIMATED BLOOD LOSS:  Minimal.  COMPLICATIONS:  None.  SPECIMENS:  Cultures from left wrist to micro, fluid for crystal exam, and wrist synovium and tenosynovium to Pathology.  TOURNIQUET TIME:  57 minutes.  DISPOSITION:  Stable to PACU.  INDICATIONS:  [name] is an 78 year old right-hand dominant male who a few days ago began to notice pain in his left wrist after a fall in the shower.  He was admitted by the hospitalist and was found to have positive blood cultures, as well as a positive urine culture.  He has been on antibiotics.  I was consulted for management of wrist pain.  He states he does not remember any previous injuries to the wrist and no previous problems.  He did not remember any wounds.  On examination, he had a painful and swollen left wrist.  There was mild erythema.  There was no streaking.  An aspiration was performed last evening. Examination of the aspiration revealed no crystals, a white count of 49,000 with a neutrophil percentage of 95%.  I recommended to [name] going to the operating room for irrigation debridement of the wrist. Risks, benefits, alternatives of surgery were discussed including the risk of blood loss, infection, damage to nerves, vessels, tendons, ligaments, bone, fair surgery, need for additional surgery, complications with wound healing,  continued pain, continued infection, need for repeat irrigation debridement.  He voiced understanding and elected to proceed.  OPERATIVE COURSE:  After being identified preoperatively by myself, the patient and I agreed upon the procedure and site of procedure.  The surgical site was marked.  The risks, benefits and alternatives of surgery were reviewed and he wished to proceed.  Surgical consent had been signed.  He was transferred to the operating room and placed on the operating table in supine position with the left upper extremity in an armboard.  General anesthesia was induced by an anesthesiologist.  The left upper extremity was prepped and draped in normal sterile orthopedic fashion.  A surgical pause was performed between surgeons, anesthesia and operating staff, and all were in agreement with the patient, the procedure, and site of procedure.  The tourniquet at the proximal aspect of the extremity was inflated to 250 mmHg after gravity exsanguination of the hand and arm and Esmarch exsanguination of the forearm.  The wrist was approached from dorsally.  A 6 cm incision was made longitudinally just ulnar to Lister's tubercle.  This was carried into subcutaneous tissues by spreading technique.  The extensor retinaculum was identified.  It was  split between the 3rd and 4th dorsal compartments.  The capsule was identified and was split as well.  Care was taken to protect the dorsal wrist ligaments as best possible.  There was inflammatory fluid from within the sheaths of the 3rd and 4th dorsal compartments.  The wrist joint was entered.  There was cloudy fluid within the wrist joint.  Cultures were taken and sent to microbiology. Some fluid was sent for crystal exam.  The wrist was copiously irrigated with sterile saline.  There was thickened synovium around the 4th dorsal compartment tendons.  This was debrided using the rongeurs.  This was sent to pathology for examination.  Some  of the thickened synovium of the wrist joint was debrided as well.  This was also sent to pathology.  The 2nd and 5th dorsal compartment tendons were identified as well.  With milking there was some inflammatory fluid within these tendon sheaths as well.  The hand and forearm were milked toward the wound to get all the fluid out as best possible.  3000 mL of sterile saline was irrigated through the wrist joint and tendon sheaths using cysto tubing and a vessel cannula.  During irrigation the effluent turned clear and no further purulence or inflammatory fluid was found.  The wrist was placed through a range of motion multiple times throughout the irrigation to ensure as much fluid as possible was irrigated from within the wrist. There was noted to be widening of the scapholunate interval with incompetence of the scapholunate ligament.  This looked chronic.  There was a small chunk of bone that was attached to the portion of the capsule upon entering it.  The radiocarpal and midcarpal joints were able to be easily irrigated.  The fluid that was found within the wrist was a cloudy reddish brown fluid.  The wrist joint and wound were then packed with quarter-inch iodoform wick gauze.  Two 4-0 nylon sutures were placed in the skin to prevent wound retraction.  These were placed very loosely.  The wound was injected with 10 mL of 0.25% plain Marcaine to aid in postoperative analgesia.  The wound was dressed with sterile Xeroform, 4x4s, and ABD, and wrapped with Kerlix.  A volar splint was placed and wrapped with Kerlix and Ace bandage.  The tourniquet was deflated at 57 minutes.  The fingertips were pink with brisk capillary refill after deflation of the tourniquet.  Operative drapes were broken down and the patient was awoken from anesthesia safely.  He was transferred back to the stretcher and taken to PACU in stable condition. He will be continued on IV antibiotics.  In 2-3 days we will  start hydrotherapy with packing change to the left wrist.     Betha Loa, MD     KK/MEDQ  D:  12/07/2011  T:  12/08/2011  Job:  [id number]

## 2011-12-08 NOTE — Progress Notes (Signed)
Subjective:  Patient denies any chest pain or shortness of breath. Left hand swelling improved states pain has improved.  Objective:  Vital Signs in the last 24 hours: Temp:  [97.2 F (36.2 C)-98.1 F (36.7 C)] 98 F (36.7 C) (05/13 1500) Pulse Rate:  [62-77] 70  (05/13 1500) Resp:  [18-20] 19  (05/13 1500) BP: (101-120)/(50-60) 102/56 mmHg (05/13 1500) SpO2:  [94 %-99 %] 98 % (05/13 1500)  Intake/Output from previous day: 05/12 0701 - 05/13 0700 In: 2775 [I.V.:2325; IV Piggyback:450] Out: 1550 [Urine:1550] Intake/Output from this shift: Total I/O In: 1110 [P.O.:360; I.V.:600; IV Piggyback:150] Out: 350 [Urine:350]  Physical Exam: Neck: no adenopathy, no carotid bruit, no JVD and supple, symmetrical, trachea midline Lungs: clear to auscultation bilaterally Heart: irregularly irregular rhythm, S1, S2 normal and Soft systolic murmur noted Abdomen: soft, non-tender; bowel sounds normal; no masses,  no organomegaly Extremities: extremities normal, atraumatic, no cyanosis or edema and Left forearm wrist surgical dressing peripheral circulation okay  Lab Results:  Basename 12/08/11 0540 12/06/11 0531  WBC 9.7 11.9*  HGB 8.8* 8.7*  PLT 121* 74*    Basename 12/08/11 0540 12/06/11 0531  NA 134* 131*  K 3.5 3.9  CL 102 100  CO2 23 21  GLUCOSE 138* 115*  BUN 17 29*  CREATININE 1.05 1.25   No results found for this basename: TROPONINI:2,CK,MB:2 in the last 72 hours Hepatic Function Panel No results found for this basename: PROT,ALBUMIN,AST,ALT,ALKPHOS,BILITOT,BILIDIR,IBILI in the last 72 hours No results found for this basename: CHOL in the last 72 hours No results found for this basename: PROTIME in the last 72 hours  Imaging: Imaging results have been reviewed and No results found.  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   LOS: 5 days    [name] N 12/08/2011, 4:48 PM

## 2011-12-09 ENCOUNTER — Inpatient Hospital Stay (HOSPITAL_COMMUNITY): Payer: Medicare Other

## 2011-12-09 LAB — CBC
HCT: 24.2 % — ABNORMAL LOW (ref 39.0–52.0)
Hemoglobin: 8.1 g/dL — ABNORMAL LOW (ref 13.0–17.0)
MCH: 30.9 pg (ref 26.0–34.0)
MCHC: 33.5 g/dL (ref 30.0–36.0)
MCV: 92.4 fL (ref 78.0–100.0)
Platelets: 145 10*3/uL — ABNORMAL LOW (ref 150–400)
RBC: 2.62 MIL/uL — ABNORMAL LOW (ref 4.22–5.81)
RDW: 14.2 % (ref 11.5–15.5)
WBC: 7.9 10*3/uL (ref 4.0–10.5)

## 2011-12-09 LAB — BASIC METABOLIC PANEL
BUN: 13 mg/dL (ref 6–23)
CO2: 22 mEq/L (ref 19–32)
Calcium: 7.6 mg/dL — ABNORMAL LOW (ref 8.4–10.5)
Chloride: 105 mEq/L (ref 96–112)
Creatinine, Ser: 0.96 mg/dL (ref 0.50–1.35)
GFR calc Af Amer: 86 mL/min — ABNORMAL LOW (ref >90)
GFR calc non Af Amer: 75 mL/min — ABNORMAL LOW (ref >90)
Glucose, Bld: 123 mg/dL — ABNORMAL HIGH (ref 70–99)
Potassium: 3.2 mEq/L — ABNORMAL LOW (ref 3.5–5.1)
Sodium: 134 mEq/L — ABNORMAL LOW (ref 135–145)

## 2011-12-09 MED ORDER — POTASSIUM CHLORIDE CRYS ER 20 MEQ PO TBCR
40.0000 meq | EXTENDED_RELEASE_TABLET | Freq: Once | ORAL | Status: AC
  Administered 2011-12-09: 40 meq via ORAL
  Filled 2011-12-09: qty 2

## 2011-12-09 NOTE — Progress Notes (Signed)
Occupational Therapy Treatment Patient Details Name: Enoch Moffa MRN: [id number] DOB: June 11, 1927 Today's Date: 12/09/2011 Time: 4098-1191 OT Time Calculation (min): 14 min  OT Assessment / Plan / Recommendation Comments on Treatment Session Pt verbalizes need to get OOB but not willing to try today; fearful of back spasms    Follow Up Recommendations  Skilled nursing facility    Barriers to Discharge       Equipment Recommendations  Defer to next venue    Recommendations for Other Services    Frequency Min 1X/week   Plan Discharge plan remains appropriate    Precautions / Restrictions Precautions Precautions: Fall Restrictions Weight Bearing Restrictions: No Other Position/Activity Restrictions: LUE elevated; mission sling:  s/p I & D for septic arthritis   Pertinent Vitals/Pain     ADL  Grooming: Performed;Brushing hair;Moderate assistance (also set up for face) Ambulation Related to ADLs: declined OOB/EOB due to back spasms ADL Comments: Pt in mission sling.  could not move arm without assist:  gentle AAROM to shoulder all planes x 10; arom to elbow and fingers    OT Diagnosis:    OT Problem List:   OT Treatment Interventions:     OT Goals Acute Rehab OT Goals Time For Goal Achievement: 12/20/11 Potential to Achieve Goals: Fair ADL Goals Pt Will Perform Grooming: with set-up;Sitting, chair;Sitting, edge of bed;Supported;Unsupported ADL Goal: Grooming - Progress: Progressing toward goals Arm Goals Pt Will Perform AROM: with minimal assist;to decrease contractures;Left upper extremity;1 set;10 reps Arm Goal: AROM - Progress: Progressing toward goal  Visit Information  Last OT Received On: 12/09/11 Assistance Needed: +2    Subjective Data  Subjective: I can't sit up at the side of the bed--my back spasms   Prior Functioning       Cognition  Behavior During Session: [facility] for tasks performed Cognition - Other Comments: verbalizes need for OOB but not willing  to try today due to back spasms    Mobility Bed Mobility Bed Mobility:  (unwilling to perform)   Exercises Shoulder Exercises Shoulder Flexion: AAROM;10 reps (to 40 degrees flex, abduction and ER) Elbow Flexion: AROM;10 reps.  Pt able to wiggle fingers; educated to do this for edema management in addition to mission sling.  Balance    End of Session OT - End of Session Activity Tolerance: Other (comment) (tolerated bed level tasks well) Patient left: in bed;with call bell/phone within reach   [facility] 12/09/2011, 2:56 PM Marica Otter, OTR/L [phone] 12/09/2011

## 2011-12-09 NOTE — Progress Notes (Signed)
Subjective: 2 Days Post-Op Procedure(s) (LRB): IRRIGATION AND DEBRIDEMENT EXTREMITY (Left) Patient reports pain as mild.  Wrist feels much better.  Tolerated hydrotherapy.  Objective: Vital signs in last 24 hours: Temp:  [97.3 F (36.3 C)-99 F (37.2 C)] 97.3 F (36.3 C) (05/14 1353) Pulse Rate:  [61-84] 61  (05/14 1353) Resp:  [18-20] 18  (05/14 1353) BP: (109-129)/(59-68) 118/62 mmHg (05/14 1658) SpO2:  [96 %-98 %] 96 % (05/14 1353)  Intake/Output from previous day: 05/13 0701 - 05/14 0700 In: 2560 [P.O.:360; I.V.:1500; IV Piggyback:700] Out: 1725 [Urine:1725] Intake/Output this shift: Total I/O In: 960 [P.O.:960] Out: 800 [Urine:800]   Basename 12/09/11 0451 12/08/11 0540  HGB 8.1* 8.8*    Basename 12/09/11 0451 12/08/11 0540  WBC 7.9 9.7  RBC 2.62* 2.80*  HCT 24.2* 25.9*  PLT 145* 121*    Basename 12/09/11 0451 12/08/11 0540  NA 134* 134*  K 3.2* 3.5  CL 105 102  CO2 22 23  BUN 13 17  CREATININE 0.96 1.05  GLUCOSE 123* 138*  CALCIUM 7.6* 7.9*   No results found for this basename: LABPT:2,INR:2 in the last 72 hours  Sensation intact distally Compartment soft brisk capillary refill.  swelling decreased in hand.  erythema decreased.  wound c/d/i.  no streaks.  Assessment/Plan: 2 Days Post-Op Procedure(s) (LRB): IRRIGATION AND DEBRIDEMENT EXTREMITY (Left) Doing well. Continue hydrotherapy every other day.  Can be done as outpatient once patient ready for d/c medically.  Cultures negative so far.  Saahir Prude R 12/09/2011, 5:52 PM

## 2011-12-09 NOTE — Progress Notes (Signed)
12/09/11 1030  Subjective Assessment  Subjective I hope it doesn't send me into a back spasm  Patient and Family Stated Goals to have wrist heal up  Date of Onset 12/07/11  Prior Treatments I and D of septic arthritis 5/12  Evaluation and Treatment  Evaluation and Treatment Procedures Explained to Patient/Family Yes  Evaluation and Treatment Procedures agreed to  Incision 12/07/11 Hand Left  Date First Assessed/Time First Assessed: 12/07/11 1241   Location: Hand  Location Orientation: Left  Site / Wound Assessment Clean  Incision Length (cm) 4 cm  Margins Unattacted edges (unapproximated)  Closure Surface sutures  Drainage Amount Scant  Drainage Description Sanguineous  Treatment Cleansed  Dressing Type Gauze (Comment) (1/4" iodoform packing strip (~ 10 inches), rigid extensor sp)  Dressing Changed  Hydrotherapy  Pulsed Lavage with Suction (psi) 8 psi  Pulsed Lavage with Suction - Normal Saline Used 1000 mL  Pulsed Lavage Tip Tip with splash shield  Pulsed lavage therapy - wound location left dorsal wrist  Wound Therapy - Assess/Plan/Recommendations  Wound Therapy - Clinical Statement pt with surgical site on left wrist.  Old dressing moistened with saline for easier removal followed by remainder of 1000 ml saline lavage and repacking.and redressing.  Pt with edema in fingers and hand.  Pt enouraged to do finger ROM including add/add and elevation  Wound Therapy - Functional Problem List generalized arthritis  Factors Delaying/Impairing Wound Healing Multiple medical problems  Hydrotherapy Plan Dressing change;Pulsatile lavage with suction  Wound Therapy - Frequency 3X / week  Wound Therapy - Current Recommendations PT;OT  Wound Therapy - Follow Up Recommendations Home health RN  Wound Therapy Goals - Improve the function of patient's integumentary system by progressing the wound(s) through the phases of wound healing by:  Decrease Length/Width/Depth by (cm) 2  Decrease  Length/Width/Depth - Progress Goal set today  Improve Drainage Characteristics Other (comment) (none)  Improve Drainage Characteristics - Progress Goal set today  Patient/Family will be able to  verbalize dressing change and edema control strategy  Patient/Family Instruction Goal - Progress Goal set today

## 2011-12-09 NOTE — Progress Notes (Signed)
Soc Worker following case for SNF placement; B Lobbyist, BSN, [employer]

## 2011-12-09 NOTE — Progress Notes (Signed)
Subjective:  Patient denies any chest pain or shortness of breath. Left and swelling more prominent today. Patient advised to keep the arm elevated  Objective:  Vital Signs in the last 24 hours: Temp:  [97.9 F (36.6 C)-99 F (37.2 C)] 97.9 F (36.6 C) (05/14 0500) Pulse Rate:  [70-77] 72  (05/14 0500) Resp:  [19-20] 20  (05/14 0500) BP: (102-120)/(56-68) 120/68 mmHg (05/14 0500) SpO2:  [97 %-98 %] 98 % (05/14 0500)  Intake/Output from previous day: 05/13 0701 - 05/14 0700 In: 2560 [P.O.:360; I.V.:1500; IV Piggyback:700] Out: 1725 [Urine:1725] Intake/Output from this shift:    Physical Exam: Neck: no adenopathy, no carotid bruit, no JVD and supple, symmetrical, trachea midline Lungs: Decreased breath sound at bases  Heart: regular rate and rhythm, S1, S2 normal and Soft systolic murmur noted no S3 gallop Abdomen: soft, non-tender; bowel sounds normal; no masses,  no organomegaly Extremities: No clubbing cyanosis or edema left hand with good peripheral circulation  Lab Results:  Basename 12/09/11 0451 12/08/11 0540  WBC 7.9 9.7  HGB 8.1* 8.8*  PLT 145* 121*    Basename 12/09/11 0451 12/08/11 0540  NA 134* 134*  K 3.2* 3.5  CL 105 102  CO2 22 23  GLUCOSE 123* 138*  BUN 13 17  CREATININE 0.96 1.05   No results found for this basename: TROPONINI:2,CK,MB:2 in the last 72 hours Hepatic Function Panel No results found for this basename: PROT,ALBUMIN,AST,ALT,ALKPHOS,BILITOT,BILIDIR,IBILI in the last 72 hours No results found for this basename: CHOL in the last 72 hours No results found for this basename: PROTIME in the last 72 hours  Imaging: Imaging results have been reviewed and No results found.  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

## 2011-12-10 LAB — BODY FLUID CULTURE: Culture: NO GROWTH

## 2011-12-10 LAB — VANCOMYCIN, TROUGH: Vancomycin Tr: 26.4 ug/mL (ref 10.0–20.0)

## 2011-12-10 MED ORDER — POTASSIUM CHLORIDE CRYS ER 20 MEQ PO TBCR
40.0000 meq | EXTENDED_RELEASE_TABLET | Freq: Once | ORAL | Status: AC
  Administered 2011-12-10: 40 meq via ORAL
  Filled 2011-12-10: qty 2

## 2011-12-10 MED ORDER — FUROSEMIDE 10 MG/ML IJ SOLN
40.0000 mg | Freq: Once | INTRAMUSCULAR | Status: AC
  Administered 2011-12-10: 40 mg via INTRAVENOUS
  Filled 2011-12-10: qty 4

## 2011-12-10 MED ORDER — MAGNESIUM HYDROXIDE 400 MG/5ML PO SUSP
30.0000 mL | Freq: Every day | ORAL | Status: DC | PRN
  Administered 2011-12-10 – 2011-12-11 (×2): 30 mL via ORAL
  Filled 2011-12-10 (×2): qty 30

## 2011-12-10 MED ORDER — VANCOMYCIN HCL IN DEXTROSE 1-5 GM/200ML-% IV SOLN
1000.0000 mg | Freq: Two times a day (BID) | INTRAVENOUS | Status: DC
  Administered 2011-12-11 – 2011-12-12 (×4): 1000 mg via INTRAVENOUS
  Filled 2011-12-10 (×4): qty 200

## 2011-12-10 NOTE — Progress Notes (Signed)
Pt has not had a documented bowel movement since 12/03/11. Pt can not state when last bowel occurred. Dr. Sharyn Lull notified and orders for milk of magnesia were given. Will continue to monitor pt.

## 2011-12-10 NOTE — Progress Notes (Signed)
Pt informed about possible discharge on Friday. Pt states will speak with Dr. Sharyn Lull about possible placement at rehab facility/SNF later on today. Pt has no further questions at this time.

## 2011-12-10 NOTE — Progress Notes (Signed)
Subjective:  Patient denies any chest pain or shortness of breath. Left arm swelling almost resolved  Objective:  Vital Signs in the last 24 hours: Temp:  [97.3 F (36.3 C)-99.2 F (37.3 C)] 97.7 F (36.5 C) (05/15 0544) Pulse Rate:  [61-72] 68  (05/15 0544) Resp:  [18] 18  (05/15 0544) BP: (118-131)/(60-68) 124/68 mmHg (05/15 0544) SpO2:  [96 %-99 %] 98 % (05/15 0544)  Intake/Output from previous day: 05/14 0701 - 05/15 0700 In: 2130 [P.O.:960; I.V.:370; IV Piggyback:800] Out: 1630 [Urine:1630] Intake/Output from this shift: Total I/O In: 120 [P.O.:120] Out: 425 [Urine:425]  Physical Exam: Neck: no adenopathy, no carotid bruit, no JVD and supple, symmetrical, trachea midline Lungs: Decreased breath sound at bases with occasional right lung rhonchi Heart: regular rate and rhythm, S1, S2 normal and Soft systolic murmur noted no S3 gallop Abdomen: soft, non-tender; bowel sounds normal; no masses,  no organomegaly Extremities: extremities normal, atraumatic, no cyanosis or edema  Lab Results:  Basename 12/09/11 0451 12/08/11 0540  WBC 7.9 9.7  HGB 8.1* 8.8*  PLT 145* 121*    Basename 12/09/11 0451 12/08/11 0540  NA 134* 134*  K 3.2* 3.5  CL 105 102  CO2 22 23  GLUCOSE 123* 138*  BUN 13 17  CREATININE 0.96 1.05   No results found for this basename: TROPONINI:2,CK,MB:2 in the last 72 hours Hepatic Function Panel No results found for this basename: PROT,ALBUMIN,AST,ALT,ALKPHOS,BILITOT,BILIDIR,IBILI in the last 72 hours No results found for this basename: CHOL in the last 72 hours No results found for this basename: PROTIME in the last 72 hours  Imaging: Imaging results have been reviewed and Dg Chest Port 1 View  12/09/2011  *RADIOLOGY REPORT*  Clinical Data: Follow up pneumonia, back pain, history hypertension, CHF, renal insufficiency  PORTABLE CHEST - 1 VIEW  Comparison: Portable exam 0815 hours compared to 12/05/2011  Findings: Left subclavian sequential  transvenous pacemaker leads project at right atrium and right ventricle. Enlargement of cardiac silhouette. Pulmonary vascular congestion. Calcified tortuous aorta. Increased right upper lobe infiltrate. Additional mild bibasilar interstitial prominence little changed accounting for differences in technique. Prominent superior mediastinal soft tissues more prominent on previous studies. No gross pleural effusion or pneumothorax. Bones demineralized. Chronic right rotator cuff tear and chronic widening of the right AC joint noted.  IMPRESSION: Enlargement of cardiac silhouette with pulmonary vascular congestion. Increased right upper lobe infiltrate. Prominent superior mediastinal soft tissues likely accentuated by portable technique but recommend assessment on follow-up upright PA and lateral chest radiographs.  Original Report Authenticated By: Lollie Marrow, M.D.    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

## 2011-12-10 NOTE — Progress Notes (Signed)
Physical Therapy Note  Attempted PT tx session this am. Pt requested PT check back at later time. Will check back as schedule permits. Thanks. [name], PT [phone]

## 2011-12-10 NOTE — Progress Notes (Signed)
Physical Therapy Treatment Patient Details Name: [name] MRN: [id number] DOB: 02-20-1927 Today's Date: 12/10/2011 Time: 1610-1640 PT Time Calculation (min): 30 min  PT Assessment / Plan / Recommendation Comments on Treatment Session  MAX encouragement for OOB. Pt premedicated prior to session. Increased time to complete all tasks. VCs for pursed lip breathing throughout session. Recommended pt elevate L UE at end of session, but pt declined. PT did however place L forearm on pillow for some elevation. Recommend SNF.     Follow Up Recommendations  Skilled nursing facility    Barriers to Discharge        Equipment Recommendations  Defer to next venue    Recommendations for Other Services    Frequency Min 3X/week   Plan Discharge plan remains appropriate    Precautions / Restrictions Precautions Precautions: Fall Restrictions Weight Bearing Restrictions: L UE in mission sling and should be elevated   Pertinent Vitals/Pain     Mobility  Bed Mobility Bed Mobility: Supine to Sit Supine to Sit: 1: +2 Total assist Supine to Sit: Patient Percentage: 30% Details for Bed Mobility Assistance: Assist for trunk to upright and bil LEs off bed. MAX cues for encouragment, continuation of task. Increased time with multiple start, stops at pt's request.  Transfers Transfers: Sit to Stand;Stand to Sit;Squat Pivot Transfers Sit to Stand: 1: +2 Total assist Sit to Stand: Patient Percentage: 30% Stand to Sit: 1: +2 Total assist Stand to Sit: Patient Percentage: 30% Squat Pivot Transfers: 1: +2 Total assist Squat Pivot Transfers: Patient Percentage: 30% Details for Transfer Assistance: 2 attempts to rise. Assist to rise, weightshift, and support pt in upright position for 2-3 pivotal steps to chair.Increased time. Ambulation/Gait Ambulation/Gait Assistance: Not tested (comment)    Exercises     PT Diagnosis:    PT Problem List:   PT Treatment Interventions:     PT Goals Acute Rehab  PT Goals PT Goal: Supine/Side to Sit - Progress: Progressing toward goal PT Goal: Sit to Stand - Progress: Progressing toward goal PT Goal: Stand to Sit - Progress: Progressing toward goal PT Transfer Goal: Bed to Chair/Chair to Bed - Progress: Progressing toward goal  Visit Information  Last PT Received On: 12/10/11 Assistance Needed: +2    Subjective Data  Subjective: "I didn't think we were gonna make it" Patient Stated Goal: Rehab   Cognition  Overall Cognitive Status: Appears within functional limits for tasks assessed/performed Arousal/Alertness: Awake/alert Orientation Level: Appears intact for tasks assessed Behavior During Session: [facility] for tasks performed    Balance  Balance Balance Assessed: Yes Static Sitting Balance Static Sitting - Balance Support: Feet supported;Bilateral upper extremity supported Static Sitting - Level of Assistance: 1: +1 Total assist Static Sitting - Comment/# of Minutes: Progressed from +2 assist to Mod/Min assist for EOB sitting. Sat EOB 5 minutes before pivot to chair. VCs for safety, foot placement, use of R UE to aid in stabilizing.   End of Session PT - End of Session Equipment Utilized During Treatment: Gait belt Activity Tolerance: Patient limited by pain Patient left: with call bell/phone within reach;in chair    [name] [facility] 12/10/2011, 5:00 PM [phone]

## 2011-12-10 NOTE — Plan of Care (Signed)
Problem: Phase II Progression Outcomes Goal: Progress activity as tolerated unless otherwise ordered Outcome: Progressing Pt out of bed into chair with PT

## 2011-12-10 NOTE — Progress Notes (Signed)
ANTIBIOTIC CONSULT NOTE - Follow Up  Pharmacy Consult for Vancomycin Indication:  Group A Strep blood cx  Allergies  Allergen Reactions  . Amiodarone     REACTION: fluid on lungs    Patient Measurements: Height: 5\' 7"  (170.2 cm) Weight: 166 lb 10.7 oz (75.6 kg) IBW/kg (Calculated) : 66.1   Vital Signs: Temp: 98 F (36.7 C) (05/15 1336) Temp src: Oral (05/15 1336) BP: 119/66 mmHg (05/15 1336) Pulse Rate: 68  (05/15 1336)  Labs:  Basename 12/09/11 0451 12/08/11 0540  WBC 7.9 9.7  HGB 8.1* 8.8*  PLT 145* 121*  LABCREA -- --  CREATININE 0.96 1.05   Estimated Creatinine Clearance: 54.5 ml/min (by C-G formula based on Cr of 0.96).  Basename 12/10/11 1900 12/08/11 1837  VANCOTROUGH 26.4* 10.0  VANCOPEAK -- --  Drue Dun -- --  GENTTROUGH -- --  GENTPEAK -- --  GENTRANDOM -- --  TOBRATROUGH -- --  TOBRAPEAK -- --  TOBRARND -- --  AMIKACINPEAK -- --  AMIKACINTROU -- --  AMIKACIN -- --     Microbiology: 5/8 blood: 2/2 GAS -Strep pyogenes (pending sens)  5/8 urine: NGF 5/9 sputum: normal flora 5/1 synocial aspirate: NGTD   Medications:  Anti-infectives     Start     Dose/Rate Route Frequency Ordered Stop   12/08/11 2000   vancomycin (VANCOCIN) 1,250 mg in sodium chloride 0.9 % 250 mL IVPB        1,250 mg 166.7 mL/hr over 90 Minutes Intravenous Every 12 hours 12/08/11 1918     12/04/11 2000   vancomycin (VANCOCIN) 750 mg in sodium chloride 0.9 % 150 mL IVPB  Status:  Discontinued        750 mg 150 mL/hr over 60 Minutes Intravenous Every 12 hours 12/04/11 1850 12/08/11 1918   12/03/11 2200   azithromycin (ZITHROMAX) 500 mg in dextrose 5 % 250 mL IVPB        500 mg 250 mL/hr over 60 Minutes Intravenous Every 24 hours 12/03/11 2007 12/09/11 2313   12/03/11 2100   cefTRIAXone (ROCEPHIN) 1 g in dextrose 5 % 50 mL IVPB        1 g 100 mL/hr over 30 Minutes Intravenous Every 24 hours 12/03/11 2007 12/09/11 2243         Assessment:  Day #7 Vancomycin for  Strep bacteremia   Vancomycin dose increased 5/13 from 750mg  IV q12h to 1250mg  IV q12h d/t low trough (10);   Renal function improved, CrCl ~ 57 ml/min/1.7m2  Vancomycin trough is supratherapeutic (26.4)  Goal of Therapy:  Vancomycin trough level 15-20 mcg/ml  Plan:   Decrease Vancomycin 1g IV q12h  Continue to monitor   Adjust dose as necessary  MD: Strep pyogenes bacteremia - occasionally can cause endocarditis, please consider r/o endocarditis, empirically treat, or ID consult  Gwen Her PharmD  [phone] 12/10/2011 8:22 PM

## 2011-12-10 NOTE — Progress Notes (Signed)
CRITICAL VALUE ALERT  Critical value received:  vanc trough 26.4  Date of notification:  12/10/2011  Time of notification:  2000  Critical value read back:yes  Nurse who received alert:  H. Earlene Plater, RN  MD notified (1st page):  Harwani  Time of first page:  2008  MD notified (2nd page):  Time of second page:  Responding MD:  Sharyn Lull  Time MD responded:  2015

## 2011-12-11 ENCOUNTER — Inpatient Hospital Stay (HOSPITAL_COMMUNITY): Payer: Medicare Other

## 2011-12-11 ENCOUNTER — Encounter (HOSPITAL_COMMUNITY): Payer: Self-pay | Admitting: Orthopedic Surgery

## 2011-12-11 LAB — CULTURE, BLOOD (ROUTINE X 2)
Culture  Setup Time: 201305090130
Culture  Setup Time: 201305090131

## 2011-12-11 LAB — BASIC METABOLIC PANEL
BUN: 13 mg/dL (ref 6–23)
CO2: 23 mEq/L (ref 19–32)
Calcium: 7.9 mg/dL — ABNORMAL LOW (ref 8.4–10.5)
Chloride: 102 mEq/L (ref 96–112)
Creatinine, Ser: 1 mg/dL (ref 0.50–1.35)
GFR calc Af Amer: 78 mL/min — ABNORMAL LOW (ref >90)
GFR calc non Af Amer: 67 mL/min — ABNORMAL LOW (ref >90)
Glucose, Bld: 125 mg/dL — ABNORMAL HIGH (ref 70–99)
Potassium: 3.7 mEq/L (ref 3.5–5.1)
Sodium: 133 mEq/L — ABNORMAL LOW (ref 135–145)

## 2011-12-11 LAB — CBC
HCT: 24.2 % — ABNORMAL LOW (ref 39.0–52.0)
Hemoglobin: 8.1 g/dL — ABNORMAL LOW (ref 13.0–17.0)
MCH: 31.2 pg (ref 26.0–34.0)
MCHC: 33.5 g/dL (ref 30.0–36.0)
MCV: 93.1 fL (ref 78.0–100.0)
Platelets: 226 10*3/uL (ref 150–400)
RBC: 2.6 MIL/uL — ABNORMAL LOW (ref 4.22–5.81)
RDW: 14.5 % (ref 11.5–15.5)
WBC: 8.9 10*3/uL (ref 4.0–10.5)

## 2011-12-11 MED ORDER — FUROSEMIDE 20 MG PO TABS
20.0000 mg | ORAL_TABLET | Freq: Every day | ORAL | Status: DC
  Administered 2011-12-11 – 2011-12-12 (×2): 20 mg via ORAL
  Filled 2011-12-11 (×2): qty 1

## 2011-12-11 MED ORDER — DOCUSATE SODIUM 100 MG PO CAPS
100.0000 mg | ORAL_CAPSULE | Freq: Every day | ORAL | Status: DC | PRN
  Filled 2011-12-11 (×2): qty 1

## 2011-12-11 MED ORDER — TAMSULOSIN HCL 0.4 MG PO CAPS
0.4000 mg | ORAL_CAPSULE | Freq: Every morning | ORAL | Status: DC
  Administered 2011-12-11 – 2011-12-12 (×2): 0.4 mg via ORAL
  Filled 2011-12-11 (×2): qty 1

## 2011-12-11 NOTE — Progress Notes (Signed)
12/11/11 0910  Subjective Assessment  Subjective OK  Patient and Family Stated Goals to have wrist heal up  Date of Onset 12/07/11  Prior Treatments I and D of septic arthritis 5/12  Evaluation and Treatment  Evaluation and Treatment Procedures Explained to Patient/Family Yes  Evaluation and Treatment Procedures agreed to  Incision 12/07/11 Hand Left  Date First Assessed/Time First Assessed: 12/07/11 1241   Location: Hand  Location Orientation: Left  Site / Wound Assessment Clean  Incision Length (cm) 4 cm (proximal  deep portion of wound beginning to fill in)  Margins Unattacted edges (unapproximated)  Closure Surface sutures  Drainage Amount Scant  Drainage Description Serosanguineous  Treatment Cleansed  Dressing Type Gauze (Comment) (1/4" iodoform packing strip (~ 10 inches), rigid extensor sp)  Dressing Changed  Hydrotherapy  Pulsed Lavage with Suction (psi) 8 psi  Pulsed Lavage with Suction - Normal Saline Used 1000 mL  Pulsed Lavage Tip Tip with splash shield  Pulsed lavage therapy - wound location left dorsal wrist  Wound Therapy - Assess/Plan/Recommendations  Wound Therapy - Clinical Statement Wound with less drainage today, but still with some serosanguinous.  Proximal wound closing in, but still able to pack distal wound areas. Edema less in hand and fingers with pt able to do AROM and elevation.  Soon will be ready to d/c pulsed lavage and continue with dressing changes per RN Will continued with QOD PLS and dressing change as ordered by Dr. Merlyn Lot  Wound Therapy - Functional Problem List generalized arthritis  Factors Delaying/Impairing Wound Healing Multiple medical problems  Hydrotherapy Plan Dressing change;Pulsatile lavage with suction  Wound Therapy - Frequency 3X / week  Wound Therapy - Current Recommendations PT;OT  Wound Therapy - Follow Up Recommendations Skilled nursing facility  Wound Therapy Goals - Improve the function of patient's integumentary system by  progressing the wound(s) through the phases of wound healing by:  Decrease Length/Width/Depth by (cm) 2  Decrease Length/Width/Depth - Progress Progressing toward goal  Improve Drainage Characteristics Other (comment) (none)  Improve Drainage Characteristics - Progress Progressing toward goal  Patient/Family will be able to  verbalize dressing change and edema control strategy  Patient/Family Instruction Goal - Progress Progressing toward goal

## 2011-12-11 NOTE — Progress Notes (Signed)
Subjective:  Patient complains of back pain and spasms. Denies any fever or chills. Remains afebrile  Objective:  Vital Signs in the last 24 hours: Temp:  [97.9 F (36.6 C)-98.8 F (37.1 C)] 98.8 F (37.1 C) (05/16 0537) Pulse Rate:  [62-76] 76  (05/16 0537) Resp:  [18] 18  (05/16 0537) BP: (109-150)/(55-66) 150/55 mmHg (05/16 0537) SpO2:  [94 %-98 %] 97 % (05/16 0537)  Intake/Output from previous day: 05/15 0701 - 05/16 0700 In: 800 [P.O.:360; I.V.:240; IV Piggyback:200] Out: 2895 [Urine:2895] Intake/Output from this shift:    Physical Exam: Neck: no adenopathy, no carotid bruit, no JVD and supple, symmetrical, trachea midline Lungs: Decreased breath sound at bases with occasional right lung rhonchi Heart: irregularly irregular rhythm, S1, S2 normal and Soft systolic murmur noted no S3 gallop Abdomen: soft, non-tender; bowel sounds normal; no masses,  no organomegaly Extremities: extremities normal, atraumatic, no cyanosis or edema  Lab Results:  Basename 12/11/11 0440 12/09/11 0451  WBC 8.9 7.9  HGB 8.1* 8.1*  PLT 226 145*    Basename 12/11/11 0440 12/09/11 0451  NA 133* 134*  K 3.7 3.2*  CL 102 105  CO2 23 22  GLUCOSE 125* 123*  BUN 13 13  CREATININE 1.00 0.96   No results found for this basename: TROPONINI:2,CK,MB:2 in the last 72 hours Hepatic Function Panel No results found for this basename: PROT,ALBUMIN,AST,ALT,ALKPHOS,BILITOT,BILIDIR,IBILI in the last 72 hours No results found for this basename: CHOL in the last 72 hours No results found for this basename: PROTIME in the last 72 hours  Imaging: Imaging results have been reviewed and No results found.  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

## 2011-12-11 NOTE — Progress Notes (Signed)
Physical Therapy Treatment Patient Details Name: [name] MRN: [id number] DOB: [birth year] Today's Date: 12/11/2011 Time: 4098-1191 PT Time Calculation (min): 25 min  PT Assessment / Plan / Recommendation Comments on Treatment Session  pt needed encouragement to get participate with exercise and care was taken not to initate back spasms.  Pt was able to participate with supine exercises and  improved in ability to do them acitvely after a few repetitions.  He had significant pain with any rolling and supine to sit and pain prohibiited him from gettting to stand or get OOB.  Pt encouraged to do whatever active exericse he could throughout the day     Follow Up Recommendations  Skilled nursing facility    Barriers to Discharge        Equipment Recommendations  Defer to next venue    Recommendations for Other Services    Frequency Min 3X/week   Plan Discharge plan remains appropriate;Frequency remains appropriate    Precautions / Restrictions Precautions Precautions: Fall Precaution Comments: LUE elevated and in splint s/p OR Restrictions Weight Bearing Restrictions: No   Pertinent Vitals/Pain Pt with significant pain in back with rolling or attempts to get to EOB or sit on EOB    Mobility  Bed Mobility Bed Mobility: Supine to Sit Supine to Sit: 1: +2 Total assist Supine to Sit: Patient Percentage: 30% Sit to Supine: HOB elevated;1: +2 Total assist Sit to Supine: Patient Percentage: 10% Details for Bed Mobility Assistance: Assist for trunk to upright and bil LEs off bed. MAX cues for encouragment, continuation of task. Increased time with multiple start, stops at pt's request.  Pt cries out in pain atm mid and lower back Dr. Sharyn Lull in to see pt Transfers Transfers:  (pt unable to try standing due to pain despite pain premed) Ambulation/Gait Ambulation/Gait Assistance: Not tested (comment);Other (comment) (due to pain)    Exercises Total Joint Exercises Short Arc Quad:  AROM;Both;20 reps;Supine General Exercises - Lower Extremity Ankle Circles/Pumps: PROM;20 reps;Both;Supine Quad Sets: AROM;Both;20 reps;Supine Gluteal Sets: 10 reps;Supine;AROM (also abdominal sets) Heel Slides: AAROM;10 reps;Both;Other (comment) (needs more assist for left leg than right leg) Hip ABduction/ADduction: AAROM;Both;10 reps;Supine Shoulder Exercises Shoulder Flexion: AAROM;10 reps;Supine Elbow Flexion: AROM;10 reps   PT Diagnosis:    PT Problem List:   PT Treatment Interventions:     PT Goals Acute Rehab PT Goals PT Goal: Supine/Side to Sit - Progress: Not progressing PT Goal: Sit to Supine/Side - Progress: Not progressing PT Goal: Perform Home Exercise Program - Progress: Progressing toward goal  Visit Information  Last PT Received On: 12/11/11 Assistance Needed: +2    Subjective Data  Subjective: pt reports it helps to "limber up" his legs first   Cognition  Overall Cognitive Status: Appears within functional limits for tasks assessed/performed Arousal/Alertness: Awake/alert Orientation Level: Appears intact for tasks assessed Behavior During Session: [facility] for tasks performed    Balance  Static Sitting Balance Static Sitting - Balance Support: Feet unsupported;Bilateral upper extremity supported Static Sitting - Level of Assistance: 3: Mod assist Static Sitting - Comment/# of Minutes: pt able to sit on EOB about 5 minutes. Maintained flexed posture, unable to move due to pain and spasms  End of Session PT - End of Session Activity Tolerance: Patient limited by pain Patient left: in bed;with call bell/phone within reach Nurse Communication: Mobility status    [name] 12/11/2011, 10:13 AM

## 2011-12-11 NOTE — Progress Notes (Signed)
CSW spoke with patient & sister, Ercil Cassis (home#: [phone]) re: SNF bed offers. Sister chose [facility] and plans to complete admission paperwork there this morning in anticipating of patient discharging today or tomorrow. CSW to follow-up.   Unice Bailey, LCSWA [phone]

## 2011-12-11 NOTE — Progress Notes (Signed)
Pt transfer to radiology for CXR.

## 2011-12-11 NOTE — Progress Notes (Signed)
Colace ordered PRN. Pt was able to have a bowel movement ,5/16,and does not want Colace at this time. Will continue to monitor.

## 2011-12-11 NOTE — Progress Notes (Signed)
Radiology made aware pt can be transferred without telemetry per Dr. Sharyn Lull order.

## 2011-12-12 DIAGNOSIS — J154 Pneumonia due to other streptococci: Secondary | ICD-10-CM | POA: Diagnosis not present

## 2011-12-12 DIAGNOSIS — N183 Chronic kidney disease, stage 3 unspecified: Secondary | ICD-10-CM | POA: Diagnosis not present

## 2011-12-12 DIAGNOSIS — I10 Essential (primary) hypertension: Secondary | ICD-10-CM | POA: Diagnosis not present

## 2011-12-12 DIAGNOSIS — I609 Nontraumatic subarachnoid hemorrhage, unspecified: Secondary | ICD-10-CM | POA: Diagnosis not present

## 2011-12-12 DIAGNOSIS — S62109A Fracture of unspecified carpal bone, unspecified wrist, initial encounter for closed fracture: Secondary | ICD-10-CM | POA: Diagnosis not present

## 2011-12-12 DIAGNOSIS — G459 Transient cerebral ischemic attack, unspecified: Secondary | ICD-10-CM | POA: Diagnosis not present

## 2011-12-12 DIAGNOSIS — S59909A Unspecified injury of unspecified elbow, initial encounter: Secondary | ICD-10-CM | POA: Diagnosis not present

## 2011-12-12 DIAGNOSIS — I959 Hypotension, unspecified: Secondary | ICD-10-CM | POA: Diagnosis not present

## 2011-12-12 DIAGNOSIS — S6990XA Unspecified injury of unspecified wrist, hand and finger(s), initial encounter: Secondary | ICD-10-CM | POA: Diagnosis not present

## 2011-12-12 DIAGNOSIS — I6990 Unspecified sequelae of unspecified cerebrovascular disease: Secondary | ICD-10-CM | POA: Diagnosis not present

## 2011-12-12 DIAGNOSIS — D72829 Elevated white blood cell count, unspecified: Secondary | ICD-10-CM | POA: Diagnosis not present

## 2011-12-12 DIAGNOSIS — I509 Heart failure, unspecified: Secondary | ICD-10-CM | POA: Diagnosis not present

## 2011-12-12 DIAGNOSIS — T8189XA Other complications of procedures, not elsewhere classified, initial encounter: Secondary | ICD-10-CM | POA: Diagnosis not present

## 2011-12-12 DIAGNOSIS — I4891 Unspecified atrial fibrillation: Secondary | ICD-10-CM | POA: Diagnosis not present

## 2011-12-12 DIAGNOSIS — E7800 Pure hypercholesterolemia, unspecified: Secondary | ICD-10-CM | POA: Diagnosis not present

## 2011-12-12 DIAGNOSIS — D649 Anemia, unspecified: Secondary | ICD-10-CM | POA: Diagnosis not present

## 2011-12-12 DIAGNOSIS — N400 Enlarged prostate without lower urinary tract symptoms: Secondary | ICD-10-CM | POA: Diagnosis not present

## 2011-12-12 DIAGNOSIS — M009 Pyogenic arthritis, unspecified: Secondary | ICD-10-CM | POA: Diagnosis not present

## 2011-12-12 DIAGNOSIS — W19XXXA Unspecified fall, initial encounter: Secondary | ICD-10-CM | POA: Diagnosis not present

## 2011-12-12 DIAGNOSIS — J189 Pneumonia, unspecified organism: Secondary | ICD-10-CM | POA: Diagnosis not present

## 2011-12-12 DIAGNOSIS — M6789 Other specified disorders of synovium and tendon, multiple sites: Secondary | ICD-10-CM | POA: Diagnosis not present

## 2011-12-12 DIAGNOSIS — G4700 Insomnia, unspecified: Secondary | ICD-10-CM | POA: Diagnosis not present

## 2011-12-12 DIAGNOSIS — I495 Sick sinus syndrome: Secondary | ICD-10-CM | POA: Diagnosis not present

## 2011-12-12 DIAGNOSIS — Z5189 Encounter for other specified aftercare: Secondary | ICD-10-CM | POA: Diagnosis not present

## 2011-12-12 DIAGNOSIS — I43 Cardiomyopathy in diseases classified elsewhere: Secondary | ICD-10-CM | POA: Diagnosis not present

## 2011-12-12 DIAGNOSIS — M4120 Other idiopathic scoliosis, site unspecified: Secondary | ICD-10-CM | POA: Diagnosis not present

## 2011-12-12 DIAGNOSIS — E785 Hyperlipidemia, unspecified: Secondary | ICD-10-CM | POA: Diagnosis not present

## 2011-12-12 LAB — ANAEROBIC CULTURE

## 2011-12-12 MED ORDER — CEFUROXIME AXETIL 500 MG PO TABS: 500.0000 mg | ORAL_TABLET | Freq: Two times a day (BID) | ORAL | Status: AC

## 2011-12-12 MED ORDER — FUROSEMIDE 20 MG PO TABS: 20.0000 mg | ORAL_TABLET | Freq: Every day | ORAL | Status: DC

## 2011-12-12 MED ORDER — ASPIRIN 81 MG PO TABS: 81.0000 mg | ORAL_TABLET | Freq: Every day | ORAL | Status: AC

## 2011-12-12 MED ORDER — TRAMADOL HCL 50 MG PO TABS: 50.0000 mg | ORAL_TABLET | Freq: Three times a day (TID) | ORAL | Status: AC | PRN

## 2011-12-12 NOTE — Progress Notes (Signed)
Patient is set to discharge to [facility] SNF today. Patient & his sister, Lexington Krotz (home#: [phone]) made aware. [facility] called for transport.   Clinical Social Work Department CLINICAL SOCIAL WORK PLACEMENT NOTE 12/12/2011  Patient:  PIERCE, BAROCIO  Account Number:  [id number] Admit date:  12/03/2011  Clinical Social Worker:  Orpah Greek  Date/time:  12/12/2011 01:31 PM  Clinical Social Work is seeking post-discharge placement for this patient at the following level of care:   SKILLED NURSING   (*CSW will update this form in Epic as items are completed)   12/07/2011  Patient/family provided with [facility] Department of Clinical Social Work's list of facilities offering this level of care within the geographic area requested by the patient (or if unable, by the patient's family).  12/07/2011  Patient/family informed of their freedom to choose among providers that offer the needed level of care, that participate in Medicare, Medicaid or managed care program needed by the patient, have an available bed and are willing to accept the patient.  12/07/2011  Patient/family informed of [facility]' ownership interest in [facility], as well as of the fact that they are under no obligation to receive care at this facility.  PASARR submitted to EDS on 12/07/2011 PASARR number received from EDS on 12/08/2011  FL2 transmitted to all facilities in geographic area requested by pt/family on  12/07/2011 FL2 transmitted to all facilities within larger geographic area on   Patient informed that his/her managed care company has contracts with or will negotiate with  certain facilities, including the following:     Patient/family informed of bed offers received:  12/08/2011 Patient chooses bed at [facility] [place] Physician recommends and patient chooses bed at    Patient to be transferred to [facility] [place] on   12/12/2011 Patient to be transferred to facility by [facility]  The following physician request were entered in Epic:   Additional Comments:    Unice Bailey, LCSWA [phone]

## 2011-12-12 NOTE — Discharge Summary (Signed)
[name], [name]                ACCOUNT NO.:  [id number]  MEDICAL RECORD NO.:  [id number]  LOCATION:  1438                         FACILITY:  [facility]  PHYSICIAN:  [name], M.D. DATE OF BIRTH:  [birth year]  DATE OF ADMISSION:  12/03/2011 DATE OF DISCHARGE:  12/12/2011                              DISCHARGE SUMMARY   ADMITTING DIAGNOSES: 1. Marked leukocytosis, rule out pneumonia. 2. Status post fall, rule out left wrist fracture. 3. Hypertension. 4. Chronic atrial fibrillation. 5. History of right cerebral peduncle/midbrain/subarachnoid hemorrhage     in the past. 6. Nonischemic cardiomyopathy. 7. Hypercholesteremia. 8. Chronic kidney disease, stage 3. 9. Anemia of chronic disease. 10.Sick sinus syndrome, status post permanent pacemaker. 11.Degenerative joint disease.  DISCHARGE DIAGNOSES: 1. Status post bilateral pneumonia. 2. Status post group A Strep bacteremia. 3. Status post leukocytosis. 4. Status post irrigation and debridement of left wrist septic     arthritis. 5. Status post probable transient ischemic attack/metabolic     encephalopathy. 6. Status post fall. 7. Chronic atrial fibrillation. 8. History of cerebral peduncle/midbrain/subarachnoid hemorrhage in     the past. 9. Nonischemic cardiomyopathy. 10.Status post mild systolic heart failure secondary to volume     overload. 11.Hypercholesteremia. 12.Chronic kidney disease stage 3, improved. 13.Anemia of chronic disease. 14.Sick sinus syndrome, status post permanent pacemaker in the past. 15.Cerebrovascular disease. 16.Degenerative joint disease. 17.Chronic anemia. 18.Status post thrombocytopenia. 19.Degenerative joint disease.  DISCHARGE HOME MEDICATIONS: 1. Enteric-coated aspirin 81 mg 1 tablet daily. 2. Ceftin 500 mg 1 tablet twice daily for 1 more week. 3. Furosemide 20 mg 1 tablet daily. 4. Tramadol 50 mg 1 tablet every 8 hours as needed for pain. 5. Tylenol 650 mg twice daily for pain  as needed. 6. B complex with vitamin C 1 tablet every morning. 7. Carvedilol 3.125 mg twice daily as before. 8. Digoxin 0.125 mg daily. 9. Proscar 5 mg 1 tablet daily. 10.Fish oil 1000 mg 2 capsules daily. 11.Folic acid 400 mcg by mouth every morning. 12.Lisinopril 10 mg daily at bedtime. 13.Multivitamin with mineral tablets 1 tablet twice daily as before. 14.Pravastatin 20 mg 1 tablet daily at night. 15.Flomax 0.8 mg by mouth every morning. 16.Thiamine 100 mg 1 tablet daily. 17.Ambien 10 mg by mouth at bedtime.  DIET:  Low salt, low cholesterol.  ACTIVITY:  Increase activity with assistance only.  FOLLOWUP:  Follow up with me in 2-3 weeks.  Follow up with Dr. Merlyn Casey on Monday, May 20th for hydrotherapy and further followup of his septic arthritis.  CONDITION AT DISCHARGE:  Stable.  We will follow the repeat blood cultures as outpatient.  BRIEF HISTORY AND HOSPITAL COURSE:  Mr.  [name] is an 78 year old male with past medical history significant for multiple medical problems, i.e., history of syncope, history of recurrent fall status post right cerebral peduncle/midbrain hematoma/subarachnoid and subdural hemorrhage in March 2013, hypertension, chronic atrial fibrillation, history of systolic heart failure, history of sick sinus syndrome status post permanent pacemaker, history of spinal stenosis, hypercholesteremia, chronic renal insufficiency.  He came to the ER complaining of coughing associated with high-grade fever, chills, and had fall at home.  Patient denies any chest pain, but complains of musculoskeletal rib  pain. Denies any shortness of breath.  Chest x-ray done in the ER showed bibasilar atelectasis and was noted to have elevated white count of 20,000.  Patient denies any urinary complaints, denies any abdominal pain.  States had 1 bout of diarrhea earlier this morning.  Denies any recent antibiotic use.  PAST MEDICAL HISTORY:  As above.  PAST SURGICAL HISTORY:   He had bilateral shoulder surgery in the past, had tonsillectomy in the past, had prosthetic hip replacement in the past, had right knee quadriceps tendon repair in the past.  Patient is retired.  He lives at home.  He used to smoke many years, quit 40+ years ago.  Patient reports he drinks alcohol off and on.  No history of drug abuse.  EXAMINATION:  VITAL SIGNS:  His blood pressure was 102/54, pulse was 71, temperature was 100.5. HEENT:  Conjunctiva was pink. NECK:  Supple.  No JVD. LUNGS:  Decreased breath sounds at bases with occasional rhonchi. CARDIOVASCULAR EXAM:  Irregularly irregular.  S1, S2 was soft.  There was soft systolic murmur.  No S3 gallop. ABDOMEN:  Soft.  Bowel sounds were present.  Nontender. EXTREMITIES:  There was no clubbing, cyanosis, or edema.  There was mild left wrist swelling.  LABS:  Sodium was 134, potassium 3.4, BUN 27, creatinine 1.49, hemoglobin was 10.6, hematocrit 31.8, white count of 20.5 with left shift.  His labs on May 16; sodium 133, potassium 3.7, BUN 13, creatinine 1, hemoglobin is 8.1, hematocrit 24.2, white count of 8.9. His synovial fluid AFB cultures are pending.  So far, synovial fluid cultures have been negative.  Repeat blood cultures drawn on 16th are negative so far.  Blood cultures drawn at the admission showed group A Strep.  Initial chest x-ray showed suboptimal inspiration, accounts for mild atelectasis at the bases.  Repeat x-ray showed persistent air space, probable infiltrate in the right upper lobe, functional small left pleural effusion, and left base atelectasis.  X-ray of the left wrist showed degenerative changes with first CMC and first MCP joints, widened scapholunate interval consistent with torn scapholunate ligament.  BRIEF HOSPITAL COURSE:  Patient was admitted to telemetry unit.  Patient was started on broad-spectrum antibiotics.  Pan cultures and sputum cultures were sent.  Patient grew group A Strep bovis in 2  blood cultures.  Patient received broad-spectrum antibiotics, i.e., Zithromax, Rocephin, and vancomycin for last 10 days.  Patient has remained afebrile for more than 6 days.  Patient did complain of left wrist pain and swelling, which x-ray showed no evidence of fracture and surgical consultation was obtained with Dr. Merlyn Casey.  Patient subsequently underwent aspiration of left wrist joint, which grew large number of WBCs and was felt to have septic arthritis.  Patient subsequently underwent incision, drainage and debridement of the left wrist and was placed on hydrotherapy.  Patient's wrist swelling and hand pain has gradually improved.  Patient has remained afebrile during the hospital stay.  Repeat blood cultures are still pending.  Patient will be discharged to skilled nursing facility and will be followed up with me in next 2 weeks and will be followed with Dr. Merlyn Casey on Monday. Hydrotherapy will be arranged as outpatient.  Patient also has been advised to keep the left arm elevated while in bed.     Eduardo Osier. Sharyn Casey, M.D.     MNH/MEDQ  D:  12/12/2011  T:  12/12/2011  Job:  [id number]

## 2011-12-12 NOTE — Discharge Instructions (Signed)
Septic Arthritis Septic arthritis is a germ (bacterial) infection of a joint. The most common germ that causes this problem is Staphylococcus aureus. Other common germs include Haemophilus influenzae and Group B streptococcus (common in toddlers). In septic arthritis, joint damage results from enzymes produced by the germs and white blood cells (leukocytes). White blood cells are the blood cells that work to fight infection. Blood vessel (secondary vascular) damage may occur from clots in the vessels (thrombosis) or direct compression of vessels.  Septic arthritis usually occurs suddenly with rapid onset of joint pain progressing to a fever and an illness that affects the whole body. On examination, the infected joint is:  Swollen.   Tender.   Painful.   Has a limited range of movement.  Your caregiver may use blood tests to help make the diagnosis. The results of these tests will show higher than normal:  White blood count (WBC).   C-reactive protein (CRP).   Erythrocyte sedimentation rate (ESR).  An infected joint in a newborn is often hard to detect. The newborn may show loss of movement of an arm or leg or hold the joint in one position. Often newborns do not appear as sick as an older child or adult might. Fever may be absent and laboratory work may be normal. There may be a history of mild injury to the extremity, a history of illness, or an infection. Septic arthritis may follow chickenpox. Dislocation of the hip joint (the hip comes out of joint) may occur along with fluid buildup (effusion) in the joint and infection that may result in permanent damage to the hip.  Early X-rays are often normal except for some swelling of soft tissue around the joint.   Widening of the joint space may develop over time.   With time, X-rays may demonstrate bone loss or thinning of the bones due to non-use or increased blood supply in the area.   X-rays may show a joint effusion.   Bone  scintigraphy is a specialized x-ray that measures the uptake of an injected radioactive material in an area of inflammation (soreness). This test allows earlier diagnosis, showing localized increased uptake of radioactivity around the infected joint.   Ultrasound shows the joint effusion.   Blood testing (culture) may be helpful, but removing fluid from the joint (aspiration) confirms the diagnosis of septic arthritis. The joint fluid will be cloudy (usually joint fluid is clear) with a high white blood cell count and bacteria in the fluid. Blood testing may identify the bacteria causing the infection, but it is not uncommon for blood tests to show up negative even when there is an infection.  TREATMENT  Treatment includes medications that kill germs (antibiotics) given through the vein (intravenously) and draining of the infected joint. HOME CARE INSTRUCTIONS   Because of the seriousness of an infected joint, this problem is often cared for in the hospital.   Take all medications as prescribed by your caregiver.If circumstances have forced care at home or your caregiver feels home care will be safe, take all medications as ordered. Do not stop taking medications if the problem seems better after a couple of days.   Take you or your child's temperature 2 times per day and record. Bring this information with you to your caregiver.   You must keep all of your follow-up appointments. Not keeping the appointment could result in a chronic or permanent injury, pain, disability, or even death. If there is any problem keeping the appointment, you must  call back to this facility for assistance.  An infected joint is a serious problem. Even with the best of care, it can result in life-long disability. Follow all directions! Document Released: 10/04/2002 Document Revised: 07/03/2011 Document Reviewed: 02/25/2008 [facility] Patient Information 2012 [place], [place].

## 2011-12-12 NOTE — Progress Notes (Signed)
Spoke with Larita Fife from Dr. Merrilee Seashore office.  Pt is to follow up at the office for hydrotherapy on Monday 12/15/11.  Larita Fife will call pt's sister to assist in arranging this f/u appt.  Pt will likely need continued hydrotherapy three times weekly at the office.  Ardyth Gal, RN 12/12/2011

## 2011-12-12 NOTE — Discharge Summary (Signed)
  Prior to the discharge summary dictated on 12/12/2011 dictation number is [phone]

## 2011-12-17 DIAGNOSIS — T8189XA Other complications of procedures, not elsewhere classified, initial encounter: Secondary | ICD-10-CM | POA: Diagnosis not present

## 2011-12-17 DIAGNOSIS — I509 Heart failure, unspecified: Secondary | ICD-10-CM | POA: Diagnosis not present

## 2011-12-17 DIAGNOSIS — N400 Enlarged prostate without lower urinary tract symptoms: Secondary | ICD-10-CM | POA: Diagnosis not present

## 2011-12-17 DIAGNOSIS — M009 Pyogenic arthritis, unspecified: Secondary | ICD-10-CM | POA: Diagnosis not present

## 2011-12-17 DIAGNOSIS — I10 Essential (primary) hypertension: Secondary | ICD-10-CM | POA: Diagnosis not present

## 2011-12-17 DIAGNOSIS — E785 Hyperlipidemia, unspecified: Secondary | ICD-10-CM | POA: Diagnosis not present

## 2011-12-17 DIAGNOSIS — M4120 Other idiopathic scoliosis, site unspecified: Secondary | ICD-10-CM | POA: Diagnosis not present

## 2011-12-17 LAB — CULTURE, BLOOD (ROUTINE X 2)
Culture  Setup Time: 201305161420
Culture  Setup Time: 201305161420
Culture: NO GROWTH
Culture: NO GROWTH

## 2011-12-19 DIAGNOSIS — I509 Heart failure, unspecified: Secondary | ICD-10-CM | POA: Diagnosis not present

## 2011-12-19 DIAGNOSIS — G4700 Insomnia, unspecified: Secondary | ICD-10-CM | POA: Diagnosis not present

## 2011-12-19 DIAGNOSIS — E785 Hyperlipidemia, unspecified: Secondary | ICD-10-CM | POA: Diagnosis not present

## 2011-12-19 DIAGNOSIS — I10 Essential (primary) hypertension: Secondary | ICD-10-CM | POA: Diagnosis not present

## 2011-12-19 DIAGNOSIS — M4120 Other idiopathic scoliosis, site unspecified: Secondary | ICD-10-CM | POA: Diagnosis not present

## 2011-12-19 DIAGNOSIS — M009 Pyogenic arthritis, unspecified: Secondary | ICD-10-CM | POA: Diagnosis not present

## 2011-12-24 DIAGNOSIS — M6789 Other specified disorders of synovium and tendon, multiple sites: Secondary | ICD-10-CM | POA: Diagnosis not present

## 2012-01-19 LAB — AFB CULTURE WITH SMEAR (NOT AT ARMC): Acid Fast Smear: NONE SEEN

## 2012-01-23 DIAGNOSIS — I509 Heart failure, unspecified: Secondary | ICD-10-CM | POA: Diagnosis not present

## 2012-01-23 DIAGNOSIS — G4700 Insomnia, unspecified: Secondary | ICD-10-CM | POA: Diagnosis not present

## 2012-01-23 DIAGNOSIS — D649 Anemia, unspecified: Secondary | ICD-10-CM | POA: Diagnosis not present

## 2012-01-23 DIAGNOSIS — I10 Essential (primary) hypertension: Secondary | ICD-10-CM | POA: Diagnosis not present

## 2012-01-26 DIAGNOSIS — I609 Nontraumatic subarachnoid hemorrhage, unspecified: Secondary | ICD-10-CM | POA: Diagnosis not present

## 2012-01-26 DIAGNOSIS — I959 Hypotension, unspecified: Secondary | ICD-10-CM | POA: Diagnosis not present

## 2012-01-26 DIAGNOSIS — D72829 Elevated white blood cell count, unspecified: Secondary | ICD-10-CM | POA: Diagnosis not present

## 2012-01-26 DIAGNOSIS — E7800 Pure hypercholesterolemia, unspecified: Secondary | ICD-10-CM | POA: Diagnosis not present

## 2012-01-26 DIAGNOSIS — I4891 Unspecified atrial fibrillation: Secondary | ICD-10-CM | POA: Diagnosis not present

## 2012-01-26 DIAGNOSIS — D649 Anemia, unspecified: Secondary | ICD-10-CM | POA: Diagnosis not present

## 2012-01-26 DIAGNOSIS — I10 Essential (primary) hypertension: Secondary | ICD-10-CM | POA: Diagnosis not present

## 2012-01-26 DIAGNOSIS — N400 Enlarged prostate without lower urinary tract symptoms: Secondary | ICD-10-CM | POA: Diagnosis not present

## 2012-01-26 DIAGNOSIS — I495 Sick sinus syndrome: Secondary | ICD-10-CM | POA: Diagnosis not present

## 2012-01-26 DIAGNOSIS — I43 Cardiomyopathy in diseases classified elsewhere: Secondary | ICD-10-CM | POA: Diagnosis not present

## 2012-01-26 DIAGNOSIS — M009 Pyogenic arthritis, unspecified: Secondary | ICD-10-CM | POA: Diagnosis not present

## 2012-01-26 DIAGNOSIS — M4120 Other idiopathic scoliosis, site unspecified: Secondary | ICD-10-CM | POA: Diagnosis not present

## 2012-01-26 DIAGNOSIS — M6789 Other specified disorders of synovium and tendon, multiple sites: Secondary | ICD-10-CM | POA: Diagnosis not present

## 2012-01-26 DIAGNOSIS — I509 Heart failure, unspecified: Secondary | ICD-10-CM | POA: Diagnosis not present

## 2012-01-26 DIAGNOSIS — I6990 Unspecified sequelae of unspecified cerebrovascular disease: Secondary | ICD-10-CM | POA: Diagnosis not present

## 2012-01-26 DIAGNOSIS — Z5189 Encounter for other specified aftercare: Secondary | ICD-10-CM | POA: Diagnosis not present

## 2012-01-26 DIAGNOSIS — N183 Chronic kidney disease, stage 3 unspecified: Secondary | ICD-10-CM | POA: Diagnosis not present

## 2012-01-30 DIAGNOSIS — D649 Anemia, unspecified: Secondary | ICD-10-CM | POA: Diagnosis not present

## 2012-01-30 DIAGNOSIS — I959 Hypotension, unspecified: Secondary | ICD-10-CM | POA: Diagnosis not present

## 2012-01-30 DIAGNOSIS — M4120 Other idiopathic scoliosis, site unspecified: Secondary | ICD-10-CM | POA: Diagnosis not present

## 2012-01-30 DIAGNOSIS — I10 Essential (primary) hypertension: Secondary | ICD-10-CM | POA: Diagnosis not present

## 2012-01-30 DIAGNOSIS — I509 Heart failure, unspecified: Secondary | ICD-10-CM | POA: Diagnosis not present

## 2012-01-30 DIAGNOSIS — N400 Enlarged prostate without lower urinary tract symptoms: Secondary | ICD-10-CM | POA: Diagnosis not present

## 2012-02-18 DIAGNOSIS — I509 Heart failure, unspecified: Secondary | ICD-10-CM | POA: Diagnosis not present

## 2012-02-18 DIAGNOSIS — I10 Essential (primary) hypertension: Secondary | ICD-10-CM | POA: Diagnosis not present

## 2012-02-18 DIAGNOSIS — I6990 Unspecified sequelae of unspecified cerebrovascular disease: Secondary | ICD-10-CM | POA: Diagnosis not present

## 2012-02-25 DIAGNOSIS — Z5189 Encounter for other specified aftercare: Secondary | ICD-10-CM | POA: Diagnosis not present

## 2012-02-25 DIAGNOSIS — I509 Heart failure, unspecified: Secondary | ICD-10-CM | POA: Diagnosis not present

## 2012-02-25 DIAGNOSIS — M719 Bursopathy, unspecified: Secondary | ICD-10-CM | POA: Diagnosis not present

## 2012-02-25 DIAGNOSIS — M67919 Unspecified disorder of synovium and tendon, unspecified shoulder: Secondary | ICD-10-CM | POA: Diagnosis not present

## 2012-02-25 DIAGNOSIS — I10 Essential (primary) hypertension: Secondary | ICD-10-CM | POA: Diagnosis not present

## 2012-02-25 DIAGNOSIS — M4120 Other idiopathic scoliosis, site unspecified: Secondary | ICD-10-CM | POA: Diagnosis not present

## 2012-02-25 DIAGNOSIS — M48061 Spinal stenosis, lumbar region without neurogenic claudication: Secondary | ICD-10-CM | POA: Diagnosis not present

## 2012-02-27 DIAGNOSIS — M48061 Spinal stenosis, lumbar region without neurogenic claudication: Secondary | ICD-10-CM | POA: Diagnosis not present

## 2012-02-27 DIAGNOSIS — I509 Heart failure, unspecified: Secondary | ICD-10-CM | POA: Diagnosis not present

## 2012-02-27 DIAGNOSIS — Z5189 Encounter for other specified aftercare: Secondary | ICD-10-CM | POA: Diagnosis not present

## 2012-02-27 DIAGNOSIS — M719 Bursopathy, unspecified: Secondary | ICD-10-CM | POA: Diagnosis not present

## 2012-02-27 DIAGNOSIS — I10 Essential (primary) hypertension: Secondary | ICD-10-CM | POA: Diagnosis not present

## 2012-02-27 DIAGNOSIS — M4120 Other idiopathic scoliosis, site unspecified: Secondary | ICD-10-CM | POA: Diagnosis not present

## 2012-02-27 DIAGNOSIS — I495 Sick sinus syndrome: Secondary | ICD-10-CM | POA: Diagnosis not present

## 2012-02-27 DIAGNOSIS — M67919 Unspecified disorder of synovium and tendon, unspecified shoulder: Secondary | ICD-10-CM | POA: Diagnosis not present

## 2012-03-01 DIAGNOSIS — M719 Bursopathy, unspecified: Secondary | ICD-10-CM | POA: Diagnosis not present

## 2012-03-01 DIAGNOSIS — M48061 Spinal stenosis, lumbar region without neurogenic claudication: Secondary | ICD-10-CM | POA: Diagnosis not present

## 2012-03-01 DIAGNOSIS — I10 Essential (primary) hypertension: Secondary | ICD-10-CM | POA: Diagnosis not present

## 2012-03-01 DIAGNOSIS — M4120 Other idiopathic scoliosis, site unspecified: Secondary | ICD-10-CM | POA: Diagnosis not present

## 2012-03-01 DIAGNOSIS — I509 Heart failure, unspecified: Secondary | ICD-10-CM | POA: Diagnosis not present

## 2012-03-01 DIAGNOSIS — Z5189 Encounter for other specified aftercare: Secondary | ICD-10-CM | POA: Diagnosis not present

## 2012-03-01 DIAGNOSIS — M67919 Unspecified disorder of synovium and tendon, unspecified shoulder: Secondary | ICD-10-CM | POA: Diagnosis not present

## 2012-03-02 DIAGNOSIS — I509 Heart failure, unspecified: Secondary | ICD-10-CM | POA: Diagnosis not present

## 2012-03-02 DIAGNOSIS — I428 Other cardiomyopathies: Secondary | ICD-10-CM | POA: Diagnosis not present

## 2012-03-02 DIAGNOSIS — I10 Essential (primary) hypertension: Secondary | ICD-10-CM | POA: Diagnosis not present

## 2012-03-02 DIAGNOSIS — I4891 Unspecified atrial fibrillation: Secondary | ICD-10-CM | POA: Diagnosis not present

## 2012-03-02 DIAGNOSIS — D649 Anemia, unspecified: Secondary | ICD-10-CM | POA: Diagnosis not present

## 2012-03-02 DIAGNOSIS — E7800 Pure hypercholesterolemia, unspecified: Secondary | ICD-10-CM | POA: Diagnosis not present

## 2012-03-02 DIAGNOSIS — R55 Syncope and collapse: Secondary | ICD-10-CM | POA: Diagnosis not present

## 2012-03-03 DIAGNOSIS — M48061 Spinal stenosis, lumbar region without neurogenic claudication: Secondary | ICD-10-CM | POA: Diagnosis not present

## 2012-03-03 DIAGNOSIS — I10 Essential (primary) hypertension: Secondary | ICD-10-CM | POA: Diagnosis not present

## 2012-03-03 DIAGNOSIS — I509 Heart failure, unspecified: Secondary | ICD-10-CM | POA: Diagnosis not present

## 2012-03-03 DIAGNOSIS — M67919 Unspecified disorder of synovium and tendon, unspecified shoulder: Secondary | ICD-10-CM | POA: Diagnosis not present

## 2012-03-03 DIAGNOSIS — Z5189 Encounter for other specified aftercare: Secondary | ICD-10-CM | POA: Diagnosis not present

## 2012-03-03 DIAGNOSIS — M719 Bursopathy, unspecified: Secondary | ICD-10-CM | POA: Diagnosis not present

## 2012-03-03 DIAGNOSIS — M4120 Other idiopathic scoliosis, site unspecified: Secondary | ICD-10-CM | POA: Diagnosis not present

## 2012-03-05 DIAGNOSIS — M67919 Unspecified disorder of synovium and tendon, unspecified shoulder: Secondary | ICD-10-CM | POA: Diagnosis not present

## 2012-03-05 DIAGNOSIS — I10 Essential (primary) hypertension: Secondary | ICD-10-CM | POA: Diagnosis not present

## 2012-03-05 DIAGNOSIS — M48061 Spinal stenosis, lumbar region without neurogenic claudication: Secondary | ICD-10-CM | POA: Diagnosis not present

## 2012-03-05 DIAGNOSIS — M4120 Other idiopathic scoliosis, site unspecified: Secondary | ICD-10-CM | POA: Diagnosis not present

## 2012-03-05 DIAGNOSIS — Z5189 Encounter for other specified aftercare: Secondary | ICD-10-CM | POA: Diagnosis not present

## 2012-03-05 DIAGNOSIS — I509 Heart failure, unspecified: Secondary | ICD-10-CM | POA: Diagnosis not present

## 2012-03-08 DIAGNOSIS — I509 Heart failure, unspecified: Secondary | ICD-10-CM | POA: Diagnosis not present

## 2012-03-08 DIAGNOSIS — M719 Bursopathy, unspecified: Secondary | ICD-10-CM | POA: Diagnosis not present

## 2012-03-08 DIAGNOSIS — Z5189 Encounter for other specified aftercare: Secondary | ICD-10-CM | POA: Diagnosis not present

## 2012-03-08 DIAGNOSIS — M48061 Spinal stenosis, lumbar region without neurogenic claudication: Secondary | ICD-10-CM | POA: Diagnosis not present

## 2012-03-08 DIAGNOSIS — I10 Essential (primary) hypertension: Secondary | ICD-10-CM | POA: Diagnosis not present

## 2012-03-08 DIAGNOSIS — M4120 Other idiopathic scoliosis, site unspecified: Secondary | ICD-10-CM | POA: Diagnosis not present

## 2012-03-08 DIAGNOSIS — M67919 Unspecified disorder of synovium and tendon, unspecified shoulder: Secondary | ICD-10-CM | POA: Diagnosis not present

## 2012-03-10 DIAGNOSIS — I10 Essential (primary) hypertension: Secondary | ICD-10-CM | POA: Diagnosis not present

## 2012-03-10 DIAGNOSIS — M67919 Unspecified disorder of synovium and tendon, unspecified shoulder: Secondary | ICD-10-CM | POA: Diagnosis not present

## 2012-03-10 DIAGNOSIS — M48061 Spinal stenosis, lumbar region without neurogenic claudication: Secondary | ICD-10-CM | POA: Diagnosis not present

## 2012-03-10 DIAGNOSIS — I509 Heart failure, unspecified: Secondary | ICD-10-CM | POA: Diagnosis not present

## 2012-03-10 DIAGNOSIS — Z5189 Encounter for other specified aftercare: Secondary | ICD-10-CM | POA: Diagnosis not present

## 2012-03-10 DIAGNOSIS — M719 Bursopathy, unspecified: Secondary | ICD-10-CM | POA: Diagnosis not present

## 2012-03-10 DIAGNOSIS — M4120 Other idiopathic scoliosis, site unspecified: Secondary | ICD-10-CM | POA: Diagnosis not present

## 2012-03-12 DIAGNOSIS — M48061 Spinal stenosis, lumbar region without neurogenic claudication: Secondary | ICD-10-CM | POA: Diagnosis not present

## 2012-03-12 DIAGNOSIS — I10 Essential (primary) hypertension: Secondary | ICD-10-CM | POA: Diagnosis not present

## 2012-03-12 DIAGNOSIS — M719 Bursopathy, unspecified: Secondary | ICD-10-CM | POA: Diagnosis not present

## 2012-03-12 DIAGNOSIS — I509 Heart failure, unspecified: Secondary | ICD-10-CM | POA: Diagnosis not present

## 2012-03-12 DIAGNOSIS — Z5189 Encounter for other specified aftercare: Secondary | ICD-10-CM | POA: Diagnosis not present

## 2012-03-12 DIAGNOSIS — M4120 Other idiopathic scoliosis, site unspecified: Secondary | ICD-10-CM | POA: Diagnosis not present

## 2012-03-12 DIAGNOSIS — M67919 Unspecified disorder of synovium and tendon, unspecified shoulder: Secondary | ICD-10-CM | POA: Diagnosis not present

## 2012-04-02 DIAGNOSIS — M6530 Trigger finger, unspecified finger: Secondary | ICD-10-CM | POA: Diagnosis not present

## 2012-05-12 DIAGNOSIS — M6530 Trigger finger, unspecified finger: Secondary | ICD-10-CM | POA: Diagnosis not present

## 2012-05-26 DIAGNOSIS — M545 Low back pain, unspecified: Secondary | ICD-10-CM | POA: Diagnosis not present

## 2012-05-28 DIAGNOSIS — I495 Sick sinus syndrome: Secondary | ICD-10-CM | POA: Diagnosis not present

## 2012-06-14 DIAGNOSIS — I4891 Unspecified atrial fibrillation: Secondary | ICD-10-CM | POA: Diagnosis not present

## 2012-06-14 DIAGNOSIS — I428 Other cardiomyopathies: Secondary | ICD-10-CM | POA: Diagnosis not present

## 2012-06-14 DIAGNOSIS — I10 Essential (primary) hypertension: Secondary | ICD-10-CM | POA: Diagnosis not present

## 2012-06-14 DIAGNOSIS — E7800 Pure hypercholesterolemia, unspecified: Secondary | ICD-10-CM | POA: Diagnosis not present

## 2012-07-13 ENCOUNTER — Telehealth: Payer: Self-pay | Admitting: Internal Medicine

## 2012-07-13 NOTE — Telephone Encounter (Signed)
Please reschedule to 08/24/2012 with Dr. Graciela Husbands.

## 2012-07-13 NOTE — Telephone Encounter (Signed)
New Problem:    Patient called in wanting to be seen sooner.  Please call back.

## 2012-07-13 NOTE — Telephone Encounter (Signed)
Will forward to Glynda Jaeger to reschedule.

## 2012-07-26 DIAGNOSIS — M6530 Trigger finger, unspecified finger: Secondary | ICD-10-CM | POA: Diagnosis not present

## 2012-07-26 DIAGNOSIS — S61409A Unspecified open wound of unspecified hand, initial encounter: Secondary | ICD-10-CM | POA: Diagnosis not present

## 2012-08-04 DIAGNOSIS — M6530 Trigger finger, unspecified finger: Secondary | ICD-10-CM | POA: Diagnosis not present

## 2012-08-04 DIAGNOSIS — G5600 Carpal tunnel syndrome, unspecified upper limb: Secondary | ICD-10-CM | POA: Diagnosis not present

## 2012-08-04 DIAGNOSIS — M5020 Other cervical disc displacement, unspecified cervical region: Secondary | ICD-10-CM | POA: Diagnosis not present

## 2012-08-04 DIAGNOSIS — G5620 Lesion of ulnar nerve, unspecified upper limb: Secondary | ICD-10-CM | POA: Diagnosis not present

## 2012-08-23 DIAGNOSIS — M6530 Trigger finger, unspecified finger: Secondary | ICD-10-CM | POA: Diagnosis not present

## 2012-08-23 DIAGNOSIS — G5600 Carpal tunnel syndrome, unspecified upper limb: Secondary | ICD-10-CM | POA: Diagnosis not present

## 2012-08-23 DIAGNOSIS — G5620 Lesion of ulnar nerve, unspecified upper limb: Secondary | ICD-10-CM | POA: Diagnosis not present

## 2012-08-23 DIAGNOSIS — G5640 Causalgia of unspecified upper limb: Secondary | ICD-10-CM | POA: Diagnosis not present

## 2012-08-27 ENCOUNTER — Encounter: Payer: Self-pay | Admitting: Internal Medicine

## 2012-08-27 DIAGNOSIS — I495 Sick sinus syndrome: Secondary | ICD-10-CM | POA: Diagnosis not present

## 2012-09-17 ENCOUNTER — Encounter: Payer: Self-pay | Admitting: Internal Medicine

## 2012-09-17 ENCOUNTER — Ambulatory Visit (INDEPENDENT_AMBULATORY_CARE_PROVIDER_SITE_OTHER): Payer: Medicare Other | Admitting: Internal Medicine

## 2012-09-17 VITALS — BP 162/85 | HR 60 | Ht 68.0 in | Wt 178.2 lb

## 2012-09-17 DIAGNOSIS — I4891 Unspecified atrial fibrillation: Secondary | ICD-10-CM | POA: Diagnosis not present

## 2012-09-17 DIAGNOSIS — I10 Essential (primary) hypertension: Secondary | ICD-10-CM | POA: Diagnosis not present

## 2012-09-17 DIAGNOSIS — Z950 Presence of cardiac pacemaker: Secondary | ICD-10-CM | POA: Diagnosis not present

## 2012-09-17 LAB — PACEMAKER DEVICE OBSERVATION
BRDY-0002RV: 60 {beats}/min
BRDY-0003RV: 120 {beats}/min
DEVICE MODEL PM: 134223
RV LEAD AMPLITUDE: 5.1 mv
RV LEAD IMPEDENCE PM: 430 Ohm
RV LEAD THRESHOLD: 0.8 V
VENTRICULAR PACING PM: 77

## 2012-09-17 NOTE — Progress Notes (Signed)
Patient Care Team: Robynn Pane, MD as PCP - General (Internal Medicine)   HPI  [name] is a 79 y.o. male seen in followup for pacer implanted for tachybradycardia syndrome in 2001 with device generator replacement in 2006.  He has hd no symptoms of CHF although he was told by the [place] that he had CHF.  Echo 2013-March EF 45-50%. He is able to climb 3 flts of stairs twice,  He previously took Coumadin; and has intercurrently discontinued.is not clear to me why although there was a history of a fall and a fractured wrist.he also apparently hit his head on one occasion.   Denies significant shortness of breath or chest pain. He does have arthritis He has had a stress test by history, but never a cath. Denies prior MI   Past Medical History  Diagnosis Date  . CHF (congestive heart failure)   . Atrial fibrillation   . HTN (hypertension)   . Pacemaker     [employer]   . Arthritis     incl spinal stenosis  . Renal insufficiency     question degree    Past Surgical History  Procedure Laterality Date  . Bilateral shoulder surgery    . Tonsillectomy    . Prosthetic hip - bilaterally    . R knee quadricep tendon repair    . I&d extremity  12/07/2011    Procedure: IRRIGATION AND DEBRIDEMENT EXTREMITY;  Surgeon: Tami Ribas, MD;  Location: [facility];  Service: Orthopedics;  Laterality: Left;    Current Outpatient Prescriptions  Medication Sig Dispense Refill  . acetaminophen (TYLENOL) 325 MG tablet Take 650 mg by mouth 2 (two) times daily.      Marland Kitchen aspirin 81 MG tablet Take 1 tablet (81 mg total) by mouth daily.  30 tablet  3  . B Complex-C (B-COMPLEX WITH VITAMIN C) tablet Take 1 tablet by mouth every morning.       . carvedilol (COREG) 3.125 MG tablet Take 3.125-6.25 mg by mouth See admin instructions. He takes as needed if his blood pressure is >140 he takes two tablets, if it is < 100 he takes one tablet and if it is 120 he does not take any tablets.      . digoxin  (LANOXIN) 0.125 MG tablet Take 125 mcg by mouth at bedtime.      . finasteride (PROSCAR) 5 MG tablet Take 5 mg by mouth every morning.       . folic acid (FOLVITE) 400 MCG tablet Take 400 mcg by mouth every morning.      . furosemide (LASIX) 20 MG tablet Take 1 tablet (20 mg total) by mouth daily.  30 tablet  3  . lisinopril (PRINIVIL,ZESTRIL) 10 MG tablet Take 10 mg by mouth at bedtime.      . Multiple Vitamin (MULITIVITAMIN WITH MINERALS) TABS Take 1 tablet by mouth 2 (two) times daily.       . Omega-3 Fatty Acids (FISH OIL) 1000 MG CAPS Take 2 capsules by mouth 2 (two) times daily.       . pravastatin (PRAVACHOL) 20 MG tablet Take 20 mg by mouth at bedtime.        . Tamsulosin HCl (FLOMAX) 0.4 MG CAPS Take 0.8 mg by mouth every morning.       . Thiamine HCl (VITAMIN B-1) 100 MG tablet Take 100 mg by mouth daily.        Marland Kitchen zolpidem (AMBIEN) 10 MG tablet Take 10 mg  by mouth at bedtime.      . [DISCONTINUED] buPROPion (WELLBUTRIN) 100 MG tablet Take 200 mg by mouth daily.        . [DISCONTINUED] warfarin (COUMADIN) 2 MG tablet Take 2 mg by mouth daily. 1 tablet on Sunday, Tuesday, Wednesday, Friday, and Saturday 1/2 tablet on Monday and Thursday       No current facility-administered medications for this visit.    Allergies  Allergen Reactions  . Amiodarone     REACTION: fluid on lungs    Review of Systems negative except from HPI and PMH  Physical Exam BP 162/85  Pulse 60  Ht 5\' 8"  (1.727 m)  Wt 178 lb 3.2 oz (80.831 kg)  BMI 27.1 kg/m2 Well developed and well nourished in no acute distress HENT normal E scleral and icterus clear Neck Supple Clear to ausculation Irregularly irregular Soft with active bowel sounds No clubbing cyanosis Trace Edema Alert and oriented, grossly normal motor and sensory function Skin Warm and Dry    Assessment and  Plan

## 2012-09-17 NOTE — Assessment & Plan Note (Signed)
Blood pressure is elevated. He is to see Dr. Livingston Diones next week. I recommended they consider increasing his carvedilol

## 2012-09-17 NOTE — Assessment & Plan Note (Signed)
The patient's device was interrogated.  The information was reviewed. No changes were made in the programming.    

## 2012-09-17 NOTE — Assessment & Plan Note (Signed)
Permanent atrial fibrillation. Currently on aspirin. I have reviewed with the patient the AVERROES data and would recommend anticoagulation. There are strong data supporting the role of anticoagulation in patients with falls even with those who have had subdural hematoma

## 2012-09-23 DIAGNOSIS — D235 Other benign neoplasm of skin of trunk: Secondary | ICD-10-CM | POA: Diagnosis not present

## 2012-09-27 ENCOUNTER — Encounter: Payer: Self-pay | Admitting: Internal Medicine

## 2012-09-28 ENCOUNTER — Encounter: Payer: Self-pay | Admitting: Internal Medicine

## 2012-09-28 DIAGNOSIS — I495 Sick sinus syndrome: Secondary | ICD-10-CM | POA: Diagnosis not present

## 2012-10-04 DIAGNOSIS — E7800 Pure hypercholesterolemia, unspecified: Secondary | ICD-10-CM | POA: Diagnosis not present

## 2012-10-04 DIAGNOSIS — I10 Essential (primary) hypertension: Secondary | ICD-10-CM | POA: Diagnosis not present

## 2012-10-06 DIAGNOSIS — I4891 Unspecified atrial fibrillation: Secondary | ICD-10-CM | POA: Diagnosis not present

## 2012-10-06 DIAGNOSIS — E7800 Pure hypercholesterolemia, unspecified: Secondary | ICD-10-CM | POA: Diagnosis not present

## 2012-10-06 DIAGNOSIS — I10 Essential (primary) hypertension: Secondary | ICD-10-CM | POA: Diagnosis not present

## 2012-10-26 DIAGNOSIS — I495 Sick sinus syndrome: Secondary | ICD-10-CM | POA: Diagnosis not present

## 2012-11-30 DIAGNOSIS — Z950 Presence of cardiac pacemaker: Secondary | ICD-10-CM | POA: Diagnosis not present

## 2012-11-30 DIAGNOSIS — I4891 Unspecified atrial fibrillation: Secondary | ICD-10-CM | POA: Diagnosis not present

## 2012-11-30 DIAGNOSIS — I495 Sick sinus syndrome: Secondary | ICD-10-CM | POA: Diagnosis not present

## 2012-12-30 ENCOUNTER — Encounter: Payer: Self-pay | Admitting: *Deleted

## 2013-01-12 DIAGNOSIS — E7800 Pure hypercholesterolemia, unspecified: Secondary | ICD-10-CM | POA: Diagnosis not present

## 2013-01-12 DIAGNOSIS — I10 Essential (primary) hypertension: Secondary | ICD-10-CM | POA: Diagnosis not present

## 2013-01-12 DIAGNOSIS — R7309 Other abnormal glucose: Secondary | ICD-10-CM | POA: Diagnosis not present

## 2013-01-13 DIAGNOSIS — E7800 Pure hypercholesterolemia, unspecified: Secondary | ICD-10-CM | POA: Diagnosis not present

## 2013-01-13 DIAGNOSIS — I10 Essential (primary) hypertension: Secondary | ICD-10-CM | POA: Diagnosis not present

## 2013-01-13 DIAGNOSIS — I4891 Unspecified atrial fibrillation: Secondary | ICD-10-CM | POA: Diagnosis not present

## 2013-01-13 DIAGNOSIS — R7309 Other abnormal glucose: Secondary | ICD-10-CM | POA: Diagnosis not present

## 2013-01-26 DIAGNOSIS — I4891 Unspecified atrial fibrillation: Secondary | ICD-10-CM | POA: Diagnosis not present

## 2013-01-26 DIAGNOSIS — Z950 Presence of cardiac pacemaker: Secondary | ICD-10-CM | POA: Diagnosis not present

## 2013-01-26 DIAGNOSIS — I495 Sick sinus syndrome: Secondary | ICD-10-CM | POA: Diagnosis not present

## 2013-03-02 DIAGNOSIS — I4891 Unspecified atrial fibrillation: Secondary | ICD-10-CM | POA: Diagnosis not present

## 2013-03-02 DIAGNOSIS — Z950 Presence of cardiac pacemaker: Secondary | ICD-10-CM | POA: Diagnosis not present

## 2013-03-04 ENCOUNTER — Encounter: Payer: Self-pay | Admitting: Internal Medicine

## 2013-03-29 ENCOUNTER — Encounter: Payer: Self-pay | Admitting: Internal Medicine

## 2013-03-29 DIAGNOSIS — I4891 Unspecified atrial fibrillation: Secondary | ICD-10-CM | POA: Diagnosis not present

## 2013-03-29 DIAGNOSIS — Z950 Presence of cardiac pacemaker: Secondary | ICD-10-CM | POA: Diagnosis not present

## 2013-04-06 DIAGNOSIS — M6530 Trigger finger, unspecified finger: Secondary | ICD-10-CM | POA: Diagnosis not present

## 2013-04-06 DIAGNOSIS — G5620 Lesion of ulnar nerve, unspecified upper limb: Secondary | ICD-10-CM | POA: Diagnosis not present

## 2013-04-06 DIAGNOSIS — G5600 Carpal tunnel syndrome, unspecified upper limb: Secondary | ICD-10-CM | POA: Diagnosis not present

## 2013-04-14 DIAGNOSIS — I10 Essential (primary) hypertension: Secondary | ICD-10-CM | POA: Diagnosis not present

## 2013-04-14 DIAGNOSIS — E7800 Pure hypercholesterolemia, unspecified: Secondary | ICD-10-CM | POA: Diagnosis not present

## 2013-04-14 DIAGNOSIS — R0609 Other forms of dyspnea: Secondary | ICD-10-CM | POA: Diagnosis not present

## 2013-04-14 DIAGNOSIS — I4891 Unspecified atrial fibrillation: Secondary | ICD-10-CM | POA: Diagnosis not present

## 2013-04-18 DIAGNOSIS — M5137 Other intervertebral disc degeneration, lumbosacral region: Secondary | ICD-10-CM | POA: Diagnosis not present

## 2013-04-18 DIAGNOSIS — G894 Chronic pain syndrome: Secondary | ICD-10-CM | POA: Diagnosis not present

## 2013-04-20 DIAGNOSIS — R0609 Other forms of dyspnea: Secondary | ICD-10-CM | POA: Diagnosis not present

## 2013-04-20 DIAGNOSIS — I4891 Unspecified atrial fibrillation: Secondary | ICD-10-CM | POA: Diagnosis not present

## 2013-04-20 DIAGNOSIS — E7800 Pure hypercholesterolemia, unspecified: Secondary | ICD-10-CM | POA: Diagnosis not present

## 2013-04-20 DIAGNOSIS — I10 Essential (primary) hypertension: Secondary | ICD-10-CM | POA: Diagnosis not present

## 2013-05-03 ENCOUNTER — Encounter: Payer: Self-pay | Admitting: Internal Medicine

## 2013-05-03 DIAGNOSIS — Z950 Presence of cardiac pacemaker: Secondary | ICD-10-CM | POA: Diagnosis not present

## 2013-05-03 DIAGNOSIS — I4891 Unspecified atrial fibrillation: Secondary | ICD-10-CM | POA: Diagnosis not present

## 2013-05-03 DIAGNOSIS — T82190A Other mechanical complication of cardiac electrode, initial encounter: Secondary | ICD-10-CM | POA: Diagnosis not present

## 2013-05-11 DIAGNOSIS — M6530 Trigger finger, unspecified finger: Secondary | ICD-10-CM | POA: Diagnosis not present

## 2013-05-13 ENCOUNTER — Other Ambulatory Visit: Payer: Self-pay | Admitting: Orthopedic Surgery

## 2013-05-20 ENCOUNTER — Encounter (HOSPITAL_BASED_OUTPATIENT_CLINIC_OR_DEPARTMENT_OTHER): Payer: Self-pay | Admitting: *Deleted

## 2013-05-20 NOTE — Progress Notes (Signed)
Pt does not drive-his sister moved to [place] Called dr Sharyn Lull for ekg-would need istat if [employer]

## 2013-05-26 ENCOUNTER — Ambulatory Visit (HOSPITAL_BASED_OUTPATIENT_CLINIC_OR_DEPARTMENT_OTHER)
Admission: RE | Admit: 2013-05-26 | Discharge: 2013-05-26 | Disposition: A | Discharge status: Home / Self Care | Payer: Medicare Other | Source: Ambulatory Visit | Attending: Orthopedic Surgery | Admitting: Orthopedic Surgery

## 2013-05-26 ENCOUNTER — Encounter (HOSPITAL_BASED_OUTPATIENT_CLINIC_OR_DEPARTMENT_OTHER): Payer: Medicare Other | Admitting: Anesthesiology

## 2013-05-26 ENCOUNTER — Encounter (HOSPITAL_BASED_OUTPATIENT_CLINIC_OR_DEPARTMENT_OTHER): Payer: Self-pay | Admitting: *Deleted

## 2013-05-26 ENCOUNTER — Encounter (HOSPITAL_BASED_OUTPATIENT_CLINIC_OR_DEPARTMENT_OTHER)
Admission: RE | Disposition: A | Discharge status: Home / Self Care | Payer: Self-pay | Source: Ambulatory Visit | Attending: Orthopedic Surgery

## 2013-05-26 ENCOUNTER — Ambulatory Visit (HOSPITAL_BASED_OUTPATIENT_CLINIC_OR_DEPARTMENT_OTHER): Payer: Medicare Other | Admitting: Anesthesiology

## 2013-05-26 DIAGNOSIS — Z79899 Other long term (current) drug therapy: Secondary | ICD-10-CM | POA: Diagnosis not present

## 2013-05-26 DIAGNOSIS — M129 Arthropathy, unspecified: Secondary | ICD-10-CM | POA: Diagnosis not present

## 2013-05-26 DIAGNOSIS — M6530 Trigger finger, unspecified finger: Secondary | ICD-10-CM | POA: Diagnosis not present

## 2013-05-26 DIAGNOSIS — Z950 Presence of cardiac pacemaker: Secondary | ICD-10-CM | POA: Diagnosis not present

## 2013-05-26 DIAGNOSIS — I10 Essential (primary) hypertension: Secondary | ICD-10-CM | POA: Insufficient documentation

## 2013-05-26 DIAGNOSIS — Z01812 Encounter for preprocedural laboratory examination: Secondary | ICD-10-CM | POA: Diagnosis not present

## 2013-05-26 DIAGNOSIS — I4891 Unspecified atrial fibrillation: Secondary | ICD-10-CM | POA: Diagnosis not present

## 2013-05-26 DIAGNOSIS — Z87891 Personal history of nicotine dependence: Secondary | ICD-10-CM | POA: Diagnosis not present

## 2013-05-26 DIAGNOSIS — I509 Heart failure, unspecified: Secondary | ICD-10-CM | POA: Insufficient documentation

## 2013-05-26 HISTORY — DX: Presence of spectacles and contact lenses: Z97.3

## 2013-05-26 HISTORY — PX: TRIGGER FINGER RELEASE: SHX641

## 2013-05-26 HISTORY — DX: Presence of dental prosthetic device (complete) (partial): Z97.2

## 2013-05-26 LAB — POCT I-STAT, CHEM 8
BUN: 21 mg/dL (ref 6–23)
Calcium, Ion: 1.14 mmol/L (ref 1.13–1.30)
Chloride: 107 mEq/L (ref 96–112)
Creatinine, Ser: 1.3 mg/dL (ref 0.50–1.35)
Glucose, Bld: 126 mg/dL — ABNORMAL HIGH (ref 70–99)
HCT: 36 % — ABNORMAL LOW (ref 39.0–52.0)
Hemoglobin: 12.2 g/dL — ABNORMAL LOW (ref 13.0–17.0)
Potassium: 3.7 mEq/L (ref 3.5–5.1)
Sodium: 141 mEq/L (ref 135–145)
TCO2: 25 mmol/L (ref 0–100)

## 2013-05-26 SURGERY — RELEASE, A1 PULLEY, FOR TRIGGER FINGER
Anesthesia: Monitor Anesthesia Care | Site: Finger | Laterality: Right | Wound class: Clean

## 2013-05-26 MED ORDER — FENTANYL CITRATE 0.05 MG/ML IJ SOLN: 1.0000 ug/kg | INTRAMUSCULAR | Status: DC | PRN

## 2013-05-26 MED ORDER — LIDOCAINE HCL (PF) 0.5 % IJ SOLN
INTRAMUSCULAR | Status: DC | PRN
  Administered 2013-05-26: 30 mL via INTRAVENOUS

## 2013-05-26 MED ORDER — SUCCINYLCHOLINE CHLORIDE 20 MG/ML IJ SOLN
INTRAMUSCULAR | Status: AC
  Filled 2013-05-26: qty 1

## 2013-05-26 MED ORDER — CEFAZOLIN SODIUM-DEXTROSE 2-3 GM-% IV SOLR
2.0000 g | INTRAVENOUS | Status: AC
  Administered 2013-05-26: 2 g via INTRAVENOUS

## 2013-05-26 MED ORDER — FENTANYL CITRATE 0.05 MG/ML IJ SOLN
INTRAMUSCULAR | Status: AC
  Filled 2013-05-26: qty 2

## 2013-05-26 MED ORDER — HYDROCODONE-ACETAMINOPHEN 5-325 MG PO TABS: ORAL_TABLET | ORAL | Status: DC

## 2013-05-26 MED ORDER — FENTANYL CITRATE 0.05 MG/ML IJ SOLN
50.0000 ug | INTRAMUSCULAR | Status: DC | PRN
  Administered 2013-05-26 (×2): 25 ug via INTRAVENOUS

## 2013-05-26 MED ORDER — LIDOCAINE HCL (PF) 1 % IJ SOLN
INTRAMUSCULAR | Status: AC
  Filled 2013-05-26: qty 5

## 2013-05-26 MED ORDER — CEFAZOLIN SODIUM-DEXTROSE 2-3 GM-% IV SOLR
INTRAVENOUS | Status: AC
  Filled 2013-05-26: qty 50

## 2013-05-26 MED ORDER — BUPIVACAINE HCL (PF) 0.25 % IJ SOLN
INTRAMUSCULAR | Status: DC | PRN
  Administered 2013-05-26: 7 mL

## 2013-05-26 MED ORDER — MIDAZOLAM HCL 2 MG/2ML IJ SOLN: 1.0000 mg | INTRAMUSCULAR | Status: DC | PRN

## 2013-05-26 MED ORDER — BUPIVACAINE HCL (PF) 0.25 % IJ SOLN
INTRAMUSCULAR | Status: AC
  Filled 2013-05-26: qty 30

## 2013-05-26 MED ORDER — MIDAZOLAM HCL 2 MG/2ML IJ SOLN
INTRAMUSCULAR | Status: AC
  Filled 2013-05-26: qty 2

## 2013-05-26 MED ORDER — CHLORHEXIDINE GLUCONATE 4 % EX LIQD: 60.0000 mL | Freq: Once | CUTANEOUS | Status: DC

## 2013-05-26 MED ORDER — LACTATED RINGERS IV SOLN
INTRAVENOUS | Status: DC
  Administered 2013-05-26: 09:00:00 via INTRAVENOUS

## 2013-05-26 SURGICAL SUPPLY — 34 items
BANDAGE COBAN STERILE 2 (GAUZE/BANDAGES/DRESSINGS) ×2 IMPLANT
BANDAGE CONFORM 2  STR LF (GAUZE/BANDAGES/DRESSINGS) ×2 IMPLANT
BLADE MINI RND TIP GREEN BEAV (BLADE) IMPLANT
BLADE SURG 15 STRL LF DISP TIS (BLADE) ×2 IMPLANT
BLADE SURG 15 STRL SS (BLADE) ×2
BNDG ESMARK 4X9 LF (GAUZE/BANDAGES/DRESSINGS) IMPLANT
CHLORAPREP W/TINT 26ML (MISCELLANEOUS) ×2 IMPLANT
CORDS BIPOLAR (ELECTRODE) ×2 IMPLANT
COVER MAYO STAND STRL (DRAPES) ×2 IMPLANT
COVER TABLE BACK 60X90 (DRAPES) ×2 IMPLANT
CUFF TOURNIQUET SINGLE 18IN (TOURNIQUET CUFF) ×2 IMPLANT
DRAPE EXTREMITY T 121X128X90 (DRAPE) ×2 IMPLANT
DRAPE SURG 17X23 STRL (DRAPES) ×2 IMPLANT
GAUZE XEROFORM 1X8 LF (GAUZE/BANDAGES/DRESSINGS) ×2 IMPLANT
GLOVE BIO SURGEON STRL SZ7.5 (GLOVE) ×2 IMPLANT
GLOVE BIOGEL PI IND STRL 7.5 (GLOVE) ×1 IMPLANT
GLOVE BIOGEL PI IND STRL 8 (GLOVE) ×1 IMPLANT
GLOVE BIOGEL PI INDICATOR 7.5 (GLOVE) ×1
GLOVE BIOGEL PI INDICATOR 8 (GLOVE) ×1
GLOVE SURG SS PI 7.0 STRL IVOR (GLOVE) ×2 IMPLANT
GOWN BRE IMP PREV XXLGXLNG (GOWN DISPOSABLE) ×2 IMPLANT
GOWN PREVENTION PLUS XLARGE (GOWN DISPOSABLE) ×2 IMPLANT
NEEDLE HYPO 25X1 1.5 SAFETY (NEEDLE) ×2 IMPLANT
NS IRRIG 1000ML POUR BTL (IV SOLUTION) ×2 IMPLANT
PACK BASIN DAY SURGERY FS (CUSTOM PROCEDURE TRAY) ×2 IMPLANT
PADDING CAST ABS 4INX4YD NS (CAST SUPPLIES) ×1
PADDING CAST ABS COTTON 4X4 ST (CAST SUPPLIES) ×1 IMPLANT
SPONGE GAUZE 4X4 12PLY (GAUZE/BANDAGES/DRESSINGS) ×2 IMPLANT
STOCKINETTE 4X48 STRL (DRAPES) ×2 IMPLANT
SUT ETHILON 4 0 PS 2 18 (SUTURE) ×2 IMPLANT
SYR BULB 3OZ (MISCELLANEOUS) ×2 IMPLANT
SYR CONTROL 10ML LL (SYRINGE) ×2 IMPLANT
TOWEL OR 17X24 6PK STRL BLUE (TOWEL DISPOSABLE) ×4 IMPLANT
UNDERPAD 30X30 INCONTINENT (UNDERPADS AND DIAPERS) ×2 IMPLANT

## 2013-05-26 NOTE — Anesthesia Preprocedure Evaluation (Signed)
Anesthesia Evaluation   Patient awake    Reviewed: Allergy & Precautions, H&P , NPO status , Patient's Chart, lab work & pertinent test results  Airway Mallampati: I  Neck ROM: Full    Dental  (+) Teeth Intact   Pulmonary neg pulmonary ROS,  breath sounds clear to auscultation        Cardiovascular hypertension, +CHF + dysrhythmias + pacemaker Rhythm:Irregular Rate:Normal     Neuro/Psych    GI/Hepatic   Endo/Other    Renal/GU Renal disease     Musculoskeletal   Abdominal   Peds  Hematology  (+) Blood dyscrasia, anemia ,   Anesthesia Other Findings   Reproductive/Obstetrics                           Anesthesia Physical Anesthesia Plan  ASA: III  Anesthesia Plan: MAC and Bier Block   Post-op Pain Management:    Induction: Intravenous  Airway Management Planned: Natural Airway  Additional Equipment:   Intra-op Plan:   Post-operative Plan:   Informed Consent:   Dental advisory given  Plan Discussed with: CRNA and Surgeon  Anesthesia Plan Comments:         Anesthesia Quick Evaluation

## 2013-05-26 NOTE — Transfer of Care (Signed)
Immediate Anesthesia Transfer of Care Note  Patient: [name]  Procedure(s) Performed: Procedure(s): RIGHT SMALL TRIGGER RELEASE  (Right)  Patient Location: PACU  Anesthesia Type:Bier block  Level of Consciousness: awake, alert  and oriented  Airway & Oxygen Therapy: Patient Spontanous Breathing and Patient connected to nasal cannula oxygen  Post-op Assessment: Report given to PACU RN and Post -op Vital signs reviewed and stable  Post vital signs: Reviewed  Complications: No apparent anesthesia complications

## 2013-05-26 NOTE — Op Note (Signed)
[id number] 

## 2013-05-26 NOTE — H&P (Signed)
[name] is an 79 y.o. male.   Chief Complaint: right small trigger digit HPI: 79 yo male with triggering of right small finger.  Has had this injected x 2 with recurrence of triggering.  It is bothersome to him and he wishes to have operative trigger digit release.    Past Medical History  Diagnosis Date  . CHF (congestive heart failure)   . Atrial fibrillation   . HTN (hypertension)   . Pacemaker     [employer]   . Arthritis     incl spinal stenosis  . Renal insufficiency     question degree  . Wears partial dentures     upper partial  . Wears glasses     reading    Past Surgical History  Procedure Laterality Date  . Bilateral shoulder surgery    . Tonsillectomy    . Prosthetic hip - bilaterally    . R knee quadricep tendon repair    . I&d extremity  12/07/2011    Procedure: IRRIGATION AND DEBRIDEMENT EXTREMITY;  Surgeon: Tami Ribas, MD;  Location: [facility];  Service: Orthopedics;  Laterality: Left;  Marland Kitchen Eye surgery      both catararcts    History reviewed. No pertinent family history. Social History:  reports that he quit smoking about 42 years ago. His smoking use included Cigarettes. He smoked 0.00 packs per day. He has never used smokeless tobacco. He reports that he drinks alcohol. He reports that he does not use illicit drugs.  Allergies:  Allergies  Allergen Reactions  . Amiodarone     REACTION: fluid on lungs    Medications Prior to Admission  Medication Sig Dispense Refill  . acetaminophen (TYLENOL) 325 MG tablet Take 650 mg by mouth 2 (two) times daily.      . B Complex-C (B-COMPLEX WITH VITAMIN C) tablet Take 1 tablet by mouth every morning.       . carvedilol (COREG) 3.125 MG tablet Take 3.125-6.25 mg by mouth 2 (two) times daily with a meal. He takes as needed if his blood pressure is >140 he takes two tablets, if it is < 100 he takes one tablet and if it is 120 he does not take any tablets.      . digoxin (LANOXIN) 0.125 MG tablet Take 125 mcg  by mouth at bedtime.      . finasteride (PROSCAR) 5 MG tablet Take 5 mg by mouth every morning.       . folic acid (FOLVITE) 400 MCG tablet Take 400 mcg by mouth every morning.      . furosemide (LASIX) 20 MG tablet Take 1 tablet (20 mg total) by mouth daily.  30 tablet  3  . Multiple Vitamin (MULITIVITAMIN WITH MINERALS) TABS Take 1 tablet by mouth 2 (two) times daily.       . Omega-3 Fatty Acids (FISH OIL) 1000 MG CAPS Take 2 capsules by mouth 2 (two) times daily.       . pravastatin (PRAVACHOL) 20 MG tablet Take 20 mg by mouth at bedtime.        . Tamsulosin HCl (FLOMAX) 0.4 MG CAPS Take 0.8 mg by mouth 2 (two) times daily at 10 AM and 5 PM.       . Thiamine HCl (VITAMIN B-1) 100 MG tablet Take 100 mg by mouth daily.        Marland Kitchen zolpidem (AMBIEN) 10 MG tablet Take 10 mg by mouth at bedtime.      Marland Kitchen  lisinopril (PRINIVIL,ZESTRIL) 10 MG tablet Take 10 mg by mouth at bedtime.        Results for orders placed during the hospital encounter of 05/26/13 (from the past 48 hour(s))  POCT I-STAT, CHEM 8     Status: Abnormal   Collection Time    05/26/13  8:41 AM      Result Value Range   Sodium 141  135 - 145 mEq/L   Potassium 3.7  3.5 - 5.1 mEq/L   Chloride 107  96 - 112 mEq/L   BUN 21  6 - 23 mg/dL   Creatinine, Ser 0.98  0.50 - 1.35 mg/dL   Glucose, Bld 119 (*) 70 - 99 mg/dL   Calcium, Ion 1.47  8.29 - 1.30 mmol/L   TCO2 25  0 - 100 mmol/L   Hemoglobin 12.2 (*) 13.0 - 17.0 g/dL   HCT 56.2 (*) 13.0 - 86.5 %    No results found.   A comprehensive review of systems was negative.  Blood pressure 115/65, pulse 64, temperature 98.6 F (37 C), temperature source Oral, resp. rate 16, height 5\' 8"  (1.727 m), weight 172 lb 8 oz (78.245 kg), SpO2 96.00%.  General appearance: alert, cooperative and appears stated age Head: Normocephalic, without obvious abnormality, atraumatic Neck: supple, symmetrical, trachea midline Resp: clear to auscultation bilaterally Cardio: regular rate and rhythm GI:  non tender Extremities: intact sensation and capillary refill all digits.  +epl/fpl/io.  triggering of right small finger. Pulses: 2+ and symmetric Skin: Skin color, texture, turgor normal. No rashes or lesions Neurologic: Grossly normal Incision/Wound: none  Assessment/Plan Right small finger trigger digit.  Non operative and operative treatment options were discussed with the patient and patient wishes to proceed with operative treatment. Risks, benefits, and alternatives of surgery were discussed and the patient agrees with the plan of care.   [name] R 05/26/2013, 9:31 AM

## 2013-05-26 NOTE — Brief Op Note (Signed)
05/26/2013  10:32 AM  PATIENT:  [name]  79 y.o. male  PRE-OPERATIVE DIAGNOSIS:  RIGHT SMALL TRIGGER DIGIT  POST-OPERATIVE DIAGNOSIS:  right small trigger finger  PROCEDURE:  Procedure(s): RIGHT SMALL TRIGGER RELEASE   SURGEON:  Surgeon(s): Tami Ribas, MD  PHYSICIAN ASSISTANT:   ASSISTANTS: none   ANESTHESIA:   Bier block  EBL:  Total I/O In: 600 [I.V.:600] Out: -   DRAINS: none   LOCAL MEDICATIONS USED:  MARCAINE     SPECIMEN:  No Specimen  DISPOSITION OF SPECIMEN:  N/A  COUNTS:  YES  TOURNIQUET:   Total Tourniquet Time Documented: Forearm (Right) - 18 minutes Total: Forearm (Right) - 18 minutes   DICTATION: .Other Dictation: Dictation Number [id number]  PLAN OF CARE: Discharge to home after PACU

## 2013-05-26 NOTE — Anesthesia Postprocedure Evaluation (Signed)
  Anesthesia Post-op Note  Patient: [name]  Procedure(s) Performed: Procedure(s): RIGHT SMALL TRIGGER RELEASE  (Right)  Patient Location: PACU  Anesthesia Type:Regional  Level of Consciousness: awake and alert   Airway and Oxygen Therapy: Patient Spontanous Breathing  Post-op Pain: none  Post-op Assessment: Post-op Vital signs reviewed  Post-op Vital Signs: stable  Complications: No apparent anesthesia complications

## 2013-05-27 ENCOUNTER — Encounter (HOSPITAL_BASED_OUTPATIENT_CLINIC_OR_DEPARTMENT_OTHER): Payer: Self-pay | Admitting: Orthopedic Surgery

## 2013-05-27 NOTE — Op Note (Signed)
[name], [name]               ACCOUNT NO.:  [id number]  MEDICAL RECORD NO.:  [id number]  LOCATION:                                 FACILITY:  PHYSICIAN:  Betha Loa, MD             DATE OF BIRTH:  DATE OF PROCEDURE:  05/26/2013 DATE OF DISCHARGE:                              OPERATIVE REPORT   PREOPERATIVE DIAGNOSIS:  Right small finger trigger digit.  POSTOPERATIVE DIAGNOSIS:  Right small finger trigger digit.  PROCEDURE:  Right small finger trigger release.  SURGEON:  Betha Loa, MD  ASSISTANT:  None.  ANESTHESIA:  Bier block.  IV FLUIDS:  Per anesthesia flow sheet.  ESTIMATED BLOOD LOSS:  Minimal.  COMPLICATIONS:  None.  SPECIMENS:  None.  TOURNIQUET TIME:  18 minutes.  DISPOSITION:  Stable to PACU.  INDICATIONS:  [name] is an 79 year old male who has had a triggering digit of the right ring finger.  He has had this injected twice with recurrence.  He wished to have a surgical release for management of symptoms.  Risks, benefits, alternatives of the surgery were discussed including risk of blood loss, infection, damage to nerves, vessels, tendons, ligaments, bone; failure of surgery; need for additional surgery, complications with wound healing, continued pain, and recurrence of triggering.  He voiced understanding of these risks and elected to proceed.  OPERATIVE COURSE:  After being identified preoperatively by myself, the patient and I agreed upon procedure and site of procedure.  Surgical site was marked.  Risks, benefits, and alternatives of surgery were reviewed and he wished to proceed.  Surgical consent had been signed. He was given IV antibiotics as preoperative antibiotic prophylaxis.  He was transferred to the operating room and placed on the operating room table in supine position with the right upper extremity on arm board. Bier block anesthesia was induced by anesthesiologist.  Right upper extremity was prepped and draped in normal  sterile orthopedic fashion. A surgical pause was performed between surgeons, anesthesia, and operating room staff, and all were in agreement as to the patient, procedure, and site of procedure.  Tourniquet at the proximal aspect of the forearm had been inflated for the Bier block.  Incision was made at the volar aspect of the MP joint of the small finger.  This was carried into subcutaneous tissues by spreading technique.  Care was taken to protect the neurovascular structures.  The A1 pulley was identified and incised sharply.  The proximal aspect of the A2 pulley was incised for 1- 2 mm.  The tendons were inspected.  There was significant fraying of the FDS tendon.  This was debrided to prevent any stepped off edges.  The FDP tendon was fully intact.  The wound was copiously irrigated with sterile saline.  It was closed with 4-0 nylon in a horizontal mattress fashion.  It was injected with 7 mL of 0.25% plain Marcaine to aid in postoperative analgesia.  Wound was then dressed with sterile Xeroform, 4x4s, and wrapped with a Kling and Coban dressing lightly.  Prior to suturing the wound, the patient was able to make a complete fist tightly and then extend his fingers  without any report apparent triggering.  The tourniquet was deflated at 18 minutes.  The fingertips were pink with brisk capillary refill after deflation of the tourniquet.  Operative drapes were broken down.  The patient was awoken from anesthesia safely. He was transferred back to the stretcher and taken to PACU in stable condition.  I will see him back in the office in 1 week for postoperative followup.  I will give him a prescription for Norco 5/325, 1-2 p.o. q.6 hours p.r.n. pain, dispensed #30.  He has tramadol at home and is going to try this first.     Betha Loa, MD     KK/MEDQ  D:  05/26/2013  T:  05/27/2013  Job:  [id number]

## 2013-05-31 ENCOUNTER — Encounter: Payer: Self-pay | Admitting: Internal Medicine

## 2013-05-31 DIAGNOSIS — I4891 Unspecified atrial fibrillation: Secondary | ICD-10-CM

## 2013-05-31 DIAGNOSIS — T82190A Other mechanical complication of cardiac electrode, initial encounter: Secondary | ICD-10-CM | POA: Diagnosis not present

## 2013-05-31 DIAGNOSIS — Z950 Presence of cardiac pacemaker: Secondary | ICD-10-CM | POA: Diagnosis not present

## 2013-07-25 DIAGNOSIS — I509 Heart failure, unspecified: Secondary | ICD-10-CM | POA: Diagnosis not present

## 2013-07-25 DIAGNOSIS — E7800 Pure hypercholesterolemia, unspecified: Secondary | ICD-10-CM | POA: Diagnosis not present

## 2013-07-25 DIAGNOSIS — I428 Other cardiomyopathies: Secondary | ICD-10-CM | POA: Diagnosis not present

## 2013-07-25 DIAGNOSIS — I4891 Unspecified atrial fibrillation: Secondary | ICD-10-CM | POA: Diagnosis not present

## 2013-08-02 ENCOUNTER — Encounter: Payer: Self-pay | Admitting: Internal Medicine

## 2013-08-02 DIAGNOSIS — T82190A Other mechanical complication of cardiac electrode, initial encounter: Secondary | ICD-10-CM | POA: Diagnosis not present

## 2013-08-02 DIAGNOSIS — I4891 Unspecified atrial fibrillation: Secondary | ICD-10-CM | POA: Diagnosis not present

## 2013-08-02 DIAGNOSIS — Z950 Presence of cardiac pacemaker: Secondary | ICD-10-CM | POA: Diagnosis not present

## 2013-08-30 ENCOUNTER — Encounter: Payer: Self-pay | Admitting: Internal Medicine

## 2013-08-30 DIAGNOSIS — I4891 Unspecified atrial fibrillation: Secondary | ICD-10-CM | POA: Diagnosis not present

## 2013-08-30 DIAGNOSIS — Z950 Presence of cardiac pacemaker: Secondary | ICD-10-CM | POA: Diagnosis not present

## 2013-08-30 DIAGNOSIS — T82190A Other mechanical complication of cardiac electrode, initial encounter: Secondary | ICD-10-CM | POA: Diagnosis not present

## 2013-09-26 ENCOUNTER — Other Ambulatory Visit: Payer: Self-pay | Admitting: Dermatology

## 2013-09-26 DIAGNOSIS — D235 Other benign neoplasm of skin of trunk: Secondary | ICD-10-CM | POA: Diagnosis not present

## 2013-09-26 DIAGNOSIS — L821 Other seborrheic keratosis: Secondary | ICD-10-CM | POA: Diagnosis not present

## 2013-09-26 DIAGNOSIS — L259 Unspecified contact dermatitis, unspecified cause: Secondary | ICD-10-CM | POA: Diagnosis not present

## 2013-09-26 DIAGNOSIS — L819 Disorder of pigmentation, unspecified: Secondary | ICD-10-CM | POA: Diagnosis not present

## 2013-09-26 DIAGNOSIS — L270 Generalized skin eruption due to drugs and medicaments taken internally: Secondary | ICD-10-CM | POA: Diagnosis not present

## 2013-09-27 ENCOUNTER — Encounter: Payer: Self-pay | Admitting: Internal Medicine

## 2013-09-27 DIAGNOSIS — I4891 Unspecified atrial fibrillation: Secondary | ICD-10-CM | POA: Diagnosis not present

## 2013-09-27 DIAGNOSIS — I495 Sick sinus syndrome: Secondary | ICD-10-CM | POA: Diagnosis not present

## 2013-09-27 DIAGNOSIS — Z950 Presence of cardiac pacemaker: Secondary | ICD-10-CM | POA: Diagnosis not present

## 2013-09-27 DIAGNOSIS — T82190A Other mechanical complication of cardiac electrode, initial encounter: Secondary | ICD-10-CM | POA: Diagnosis not present

## 2013-09-28 ENCOUNTER — Encounter: Payer: Self-pay | Admitting: *Deleted

## 2013-10-10 ENCOUNTER — Ambulatory Visit (INDEPENDENT_AMBULATORY_CARE_PROVIDER_SITE_OTHER): Payer: Medicare Other | Admitting: Internal Medicine

## 2013-10-10 ENCOUNTER — Encounter: Payer: Self-pay | Admitting: Internal Medicine

## 2013-10-10 VITALS — BP 129/77 | HR 62 | Ht 68.0 in | Wt 165.0 lb

## 2013-10-10 DIAGNOSIS — I495 Sick sinus syndrome: Secondary | ICD-10-CM

## 2013-10-10 DIAGNOSIS — Z950 Presence of cardiac pacemaker: Secondary | ICD-10-CM | POA: Diagnosis not present

## 2013-10-10 DIAGNOSIS — I4891 Unspecified atrial fibrillation: Secondary | ICD-10-CM

## 2013-10-10 LAB — MDC_IDC_ENUM_SESS_TYPE_INCLINIC
Brady Statistic RV Percent Paced: 82 %
Date Time Interrogation Session: 20150316040000
Implantable Pulse Generator Model: 1190
Implantable Pulse Generator Serial Number: 134223
Lead Channel Impedance Value: 420 Ohm
Lead Channel Pacing Threshold Amplitude: 0.8 V
Lead Channel Pacing Threshold Pulse Width: 0.4 ms
Lead Channel Sensing Intrinsic Amplitude: 4.7 mV
Lead Channel Setting Pacing Amplitude: 1.2 V
Lead Channel Setting Pacing Pulse Width: 0.4 ms
Lead Channel Setting Sensing Sensitivity: 1.48 mV

## 2013-10-10 NOTE — Patient Instructions (Signed)
Your physician recommends that you continue on your current medications as directed. Please refer to the Current Medication list given to you today.  Your physician recommends that you schedule a follow-up appointment in: 3 months with device clinic.  Your physician wants you to follow-up in: 1 year with [employer], [name].  You will receive a reminder letter in the mail two months in advance. If you don't receive a letter, please call our office to schedule the follow-up appointment.

## 2013-10-10 NOTE — Progress Notes (Signed)
Patient Care Team: Clent Demark, MD as PCP - General (Internal Medicine)   HPI  [name] is a 80 y.o. male seen in followup for pacer implanted for tachybradycardia syndrome in 2001 with device generator replacement in 2006.   He has hd no symptoms of CHF although he was told by the [place] that he had CHF.  Echo 2013-March EF 45-50%. He is able to climb 3 flts of stairs twice,   He previously took Coumadin; and has intercurrently discontinued.is not clear to me why although there was a history of a fall and a fractured wrist.he also apparently hit his head on one occasion. At his last visit we discussed the role of anticoagulation Denies significant shortness of breath or chest pain. He does have arthritis  He has had a stress test by history, but never a cath. Denies prior MI    Past Medical History  Diagnosis Date  . CHF (congestive heart failure)   . Atrial fibrillation   . HTN (hypertension)   . Pacemaker     [employer]   . Arthritis     incl spinal stenosis  . Renal insufficiency     question degree  . Wears partial dentures     upper partial  . Wears glasses     reading    Past Surgical History  Procedure Laterality Date  . Bilateral shoulder surgery    . Tonsillectomy    . Prosthetic hip - bilaterally    . R knee quadricep tendon repair    . I&d extremity  12/07/2011    Procedure: IRRIGATION AND DEBRIDEMENT EXTREMITY;  Surgeon: Tennis Must, MD;  Location: [facility];  Service: Orthopedics;  Laterality: Left;  Marland Kitchen Eye surgery      both catararcts  . Trigger finger release Right 05/26/2013    Procedure: RIGHT SMALL TRIGGER RELEASE ;  Surgeon: Tennis Must, MD;  Location: [facility];  Service: Orthopedics;  Laterality: Right;    Current Outpatient Prescriptions  Medication Sig Dispense Refill  . acetaminophen (TYLENOL) 325 MG tablet Take 650 mg by mouth 2 (two) times daily.      . B Complex-C (B-COMPLEX WITH VITAMIN C) tablet Take  1 tablet by mouth every morning.       . carvedilol (COREG) 3.125 MG tablet Take 3.125-6.25 mg by mouth 2 (two) times daily with a meal. He takes as needed if his blood pressure is >140 he takes two tablets, if it is < 100 he takes one tablet and if it is 120 he does not take any tablets.      . digoxin (LANOXIN) 0.125 MG tablet Take 125 mcg by mouth at bedtime.      . finasteride (PROSCAR) 5 MG tablet Take 5 mg by mouth every morning.       Marland Kitchen lisinopril (PRINIVIL,ZESTRIL) 10 MG tablet Take 10 mg by mouth at bedtime.      . Multiple Vitamin (MULITIVITAMIN WITH MINERALS) TABS Take 1 tablet by mouth 2 (two) times daily.       . Omega-3 Fatty Acids (FISH OIL) 1000 MG CAPS Take 2 capsules by mouth 2 (two) times daily.       . pravastatin (PRAVACHOL) 20 MG tablet Take 20 mg by mouth at bedtime.        . Tamsulosin HCl (FLOMAX) 0.4 MG CAPS Take 0.8 mg by mouth 2 (two) times daily at 10 AM and 5 PM.       .  Thiamine HCl (VITAMIN B-1) 100 MG tablet Take 100 mg by mouth daily.        Marland Kitchen zolpidem (AMBIEN) 10 MG tablet Take 10 mg by mouth at bedtime.      . furosemide (LASIX) 20 MG tablet Take 1 tablet (20 mg total) by mouth daily.  30 tablet  3  . [DISCONTINUED] buPROPion (WELLBUTRIN) 100 MG tablet Take 200 mg by mouth daily.        . [DISCONTINUED] warfarin (COUMADIN) 2 MG tablet Take 2 mg by mouth daily. 1 tablet on Sunday, Tuesday, Wednesday, Friday, and Saturday 1/2 tablet on Monday and Thursday       No current facility-administered medications for this visit.    Allergies  Allergen Reactions  . Amiodarone     REACTION: fluid on lungs    Review of Systems negative except from HPI and PMH  Physical Exam BP 129/77  Pulse 62  Ht 5\' 8"  (1.727 m)  Wt 165 lb (74.844 kg)  BMI 25.09 kg/m2 Well developed and well nourished in no acute distress HENT normal E scleral and icterus clear Neck Supple JVP flat; carotids brisk and full Clear to ausculation The patient's device was interrogated.  The  information was reviewed. No changes were made in the programming.   *Regular rate and rhythm, no murmurs gallops or rub Soft with active bowel sounds No clubbing cyanosis  Edema Alert and oriented, grossly normal motor and sensory function  Walks with walker Skin Warm and Dry    Assessment and  Plan  Atrial fibrillation-permanent  Bradycardia-device dependent  Pacemaker  [employer] The patient's device was interrogated.  The information was reviewed. No changes were made in the programming.     He remains functionally quite a will and is [name] in a couple of nights a week. The issues which I have asked him to discuss with Dr. Ophelia Charter include recent data highlighting mortality risks associated with digoxin digitally in patients with near normal LV function as well as the role of anticoagulation as opposed to aspirin.

## 2013-11-01 ENCOUNTER — Encounter: Payer: Self-pay | Admitting: Internal Medicine

## 2013-11-01 DIAGNOSIS — I4891 Unspecified atrial fibrillation: Secondary | ICD-10-CM | POA: Diagnosis not present

## 2013-11-01 DIAGNOSIS — Z950 Presence of cardiac pacemaker: Secondary | ICD-10-CM | POA: Diagnosis not present

## 2013-11-01 DIAGNOSIS — T82190A Other mechanical complication of cardiac electrode, initial encounter: Secondary | ICD-10-CM | POA: Diagnosis not present

## 2013-11-04 ENCOUNTER — Encounter: Payer: Self-pay | Admitting: Cardiology

## 2013-11-07 DIAGNOSIS — I4891 Unspecified atrial fibrillation: Secondary | ICD-10-CM | POA: Diagnosis not present

## 2013-11-07 DIAGNOSIS — E7800 Pure hypercholesterolemia, unspecified: Secondary | ICD-10-CM | POA: Diagnosis not present

## 2013-11-07 DIAGNOSIS — I10 Essential (primary) hypertension: Secondary | ICD-10-CM | POA: Diagnosis not present

## 2013-11-23 ENCOUNTER — Encounter: Payer: Self-pay | Admitting: Cardiology

## 2013-11-23 ENCOUNTER — Encounter: Payer: Self-pay | Admitting: *Deleted

## 2013-11-23 ENCOUNTER — Ambulatory Visit (INDEPENDENT_AMBULATORY_CARE_PROVIDER_SITE_OTHER): Payer: Medicare Other | Admitting: Cardiology

## 2013-11-23 VITALS — BP 124/70 | HR 64 | Wt 174.0 lb

## 2013-11-23 DIAGNOSIS — Z01818 Encounter for other preprocedural examination: Secondary | ICD-10-CM

## 2013-11-23 DIAGNOSIS — Z45018 Encounter for adjustment and management of other part of cardiac pacemaker: Secondary | ICD-10-CM

## 2013-11-23 DIAGNOSIS — I509 Heart failure, unspecified: Secondary | ICD-10-CM | POA: Diagnosis not present

## 2013-11-23 DIAGNOSIS — I4891 Unspecified atrial fibrillation: Secondary | ICD-10-CM | POA: Diagnosis not present

## 2013-11-23 DIAGNOSIS — I495 Sick sinus syndrome: Secondary | ICD-10-CM | POA: Diagnosis not present

## 2013-11-23 DIAGNOSIS — Z45010 Encounter for checking and testing of cardiac pacemaker pulse generator [battery]: Secondary | ICD-10-CM

## 2013-11-23 DIAGNOSIS — Z5181 Encounter for therapeutic drug level monitoring: Secondary | ICD-10-CM | POA: Diagnosis not present

## 2013-11-23 DIAGNOSIS — Z950 Presence of cardiac pacemaker: Secondary | ICD-10-CM

## 2013-11-23 LAB — BASIC METABOLIC PANEL
BUN: 23 mg/dL (ref 6–23)
CO2: 26 mEq/L (ref 19–32)
Calcium: 9.2 mg/dL (ref 8.4–10.5)
Chloride: 102 mEq/L (ref 96–112)
Creatinine, Ser: 1.1 mg/dL (ref 0.4–1.5)
GFR: 66.18 mL/min (ref >60.00)
Glucose, Bld: 108 mg/dL — ABNORMAL HIGH (ref 70–99)
Potassium: 4.2 mEq/L (ref 3.5–5.1)
Sodium: 137 mEq/L (ref 135–145)

## 2013-11-23 LAB — CBC WITH DIFFERENTIAL/PLATELET
Basophils Absolute: 0 10*3/uL (ref 0.0–0.1)
Basophils Relative: 0.3 % (ref 0.0–3.0)
Eosinophils Absolute: 0.2 10*3/uL (ref 0.0–0.7)
Eosinophils Relative: 3.2 % (ref 0.0–5.0)
HCT: 35.8 % — ABNORMAL LOW (ref 39.0–52.0)
Hemoglobin: 12.1 g/dL — ABNORMAL LOW (ref 13.0–17.0)
Lymphocytes Relative: 17.9 % (ref 12.0–46.0)
Lymphs Abs: 1.3 10*3/uL (ref 0.7–4.0)
MCHC: 33.7 g/dL (ref 30.0–36.0)
MCV: 95.4 fl (ref 78.0–100.0)
Monocytes Absolute: 0.4 10*3/uL (ref 0.1–1.0)
Monocytes Relative: 5.8 % (ref 3.0–12.0)
Neutro Abs: 5.3 10*3/uL (ref 1.4–7.7)
Neutrophils Relative %: 72.8 % (ref 43.0–77.0)
Platelets: 168 10*3/uL (ref 150.0–400.0)
RBC: 3.75 Mil/uL — ABNORMAL LOW (ref 4.22–5.81)
RDW: 14 % (ref 11.5–14.6)
WBC: 7.2 10*3/uL (ref 4.5–10.5)

## 2013-11-23 LAB — MDC_IDC_ENUM_SESS_TYPE_INCLINIC
Implantable Pulse Generator Model: 1190
Implantable Pulse Generator Serial Number: 134223
Lead Channel Setting Pacing Amplitude: 1.2 V
Lead Channel Setting Pacing Pulse Width: 0.4 ms
Lead Channel Setting Sensing Sensitivity: 1.48 mV

## 2013-11-23 LAB — PROTIME-INR
INR: 1 ratio (ref 0.8–1.0)
Prothrombin Time: 10.9 s (ref 9.6–13.1)

## 2013-11-23 NOTE — Progress Notes (Signed)
 ELECTROPHYSIOLOGY OFFICE NOTE   Patient ID: [name] MRN: [id number], DOB/AGE: 01/13/1927   Date of Visit: 11/23/2013  Primary Cardiologist: Harwani, MD Reason for Visit: EP/device follow-up  History of Present Illness  [name] is a 80 y.o. male with tachy-brady syndrome s/p PPM implant 2001, PPM gen change 2006 and atrial fibrillation who presents today for routine electrophysiology followup. His PPM battery has reached ERI. He was last seen in follow-up by Dr. Klein one month ago. Since last being seen in our clinic, he reports he is doing well and has no complaints. He only complains of arthritic pain today. He denies chest pain or shortness of breath. He denies palpitations, dizziness, near syncope or syncope. He denies LE swelling, orthopnea or PND. He reports compliance with medications. Of note, he tells me Dr. Harwani stopped his Coumadin due to increased falls and he now takes aspirin only. Dr. Klein has discussed other options for anticoagulation with him but deferred the decision to his primary cardiologist, Dr. Harwani. In addition, he continues to take digoxin despite normal LVEF.  Past Medical History Past Medical History  Diagnosis Date  . CHF (congestive heart failure)   . Atrial fibrillation   . HTN (hypertension)   . Pacemaker     [employer]   . Arthritis     incl spinal stenosis  . Renal insufficiency     question degree  . Wears partial dentures     upper partial  . Wears glasses     reading    Past Surgical History Past Surgical History  Procedure Laterality Date  . Bilateral shoulder surgery    . Tonsillectomy    . Prosthetic hip - bilaterally    . R knee quadricep tendon repair    . I&d extremity  12/07/2011    Procedure: IRRIGATION AND DEBRIDEMENT EXTREMITY;  Surgeon: Kevin R Kuzma, MD;  Location: [facility];  Service: Orthopedics;  Laterality: Left;  . Eye surgery      both catararcts  . Trigger finger release Right 05/26/2013   Procedure: RIGHT SMALL TRIGGER RELEASE ;  Surgeon: Kevin R Kuzma, MD;  Location: [facility];  Service: Orthopedics;  Laterality: Right;    Allergies/Intolerances Allergies  Allergen Reactions  . Amiodarone     REACTION: fluid on lungs    Current Home Medications Current Outpatient Prescriptions  Medication Sig Dispense Refill  . acetaminophen (TYLENOL) 325 MG tablet Take 650 mg by mouth 2 (two) times daily.      . B Complex-C (B-COMPLEX WITH VITAMIN C) tablet Take 1 tablet by mouth every morning.       . carvedilol (COREG) 3.125 MG tablet Take 3.125-6.25 mg by mouth 2 (two) times daily with a meal. He takes as needed if his blood pressure is >140 he takes two tablets, if it is < 100 he takes one tablet and if it is 120 he does not take any tablets.      . digoxin (LANOXIN) 0.125 MG tablet Take 125 mcg by mouth at bedtime.      . finasteride (PROSCAR) 5 MG tablet Take 5 mg by mouth every morning.       . furosemide (LASIX) 20 MG tablet Take 1 tablet (20 mg total) by mouth daily.  30 tablet  3  . lisinopril (PRINIVIL,ZESTRIL) 10 MG tablet Take 10 mg by mouth at bedtime.      . Multiple Vitamin (MULITIVITAMIN WITH MINERALS) TABS Take 1 tablet by mouth 2 (two) times   daily.       . Omega-3 Fatty Acids (FISH OIL) 1000 MG CAPS Take 2 capsules by mouth 2 (two) times daily.       . pravastatin (PRAVACHOL) 20 MG tablet Take 20 mg by mouth at bedtime.        . Tamsulosin HCl (FLOMAX) 0.4 MG CAPS Take 0.8 mg by mouth 2 (two) times daily at 10 AM and 5 PM.       . Thiamine HCl (VITAMIN B-1) 100 MG tablet Take 100 mg by mouth daily.        Marland Kitchen zolpidem (AMBIEN) 10 MG tablet Take 10 mg by mouth at bedtime.      . [DISCONTINUED] buPROPion (WELLBUTRIN) 100 MG tablet Take 200 mg by mouth daily.        . [DISCONTINUED] warfarin (COUMADIN) 2 MG tablet Take 2 mg by mouth daily. 1 tablet on Sunday, Tuesday, Wednesday, Friday, and Saturday 1/2 tablet on Monday and Thursday       No current  facility-administered medications for this visit.    Social History History   Social History  . Marital Status: Widowed    Spouse Name: N/A    Number of Children: N/A  . Years of Education: N/A   Occupational History  . Not on file.   Social History Main Topics  . Smoking status: Former Smoker    Types: Cigarettes    Quit date: 05/13/1971  . Smokeless tobacco: Never Used  . Alcohol Use: Yes     Comment: occasional  . Drug Use: No  . Sexual Activity: Not on file   Other Topics Concern  . Not on file   Social History Narrative  . No narrative on file     Review of Systems General: No chills, fever, night sweats or weight changes Cardiovascular: No chest pain, dyspnea on exertion, edema, orthopnea, palpitations, paroxysmal nocturnal dyspnea Dermatological: No rash, lesions or masses Respiratory: No cough, dyspnea Urologic: No hematuria, dysuria Abdominal: No nausea, vomiting, diarrhea, bright red blood per rectum, melena, or hematemesis Neurologic: No visual changes, weakness, changes in mental status All other systems reviewed and are otherwise negative except as noted above.  Physical Exam Vitals: Blood pressure 124/70, pulse 64, weight 174 lb (78.926 kg).  General: Well developed, elderly appearing 80 y.o. male in no acute distress. HEENT: Normocephalic, atraumatic. EOMs intact. Sclera nonicteric. Oropharynx clear.  Neck: Supple. No JVD. Lungs: Respirations regular and unlabored, CTA bilaterally. No wheezes, rales or rhonchi. Heart: Regular. S1, S2 present. No murmurs, rub, S3 or S4. Abdomen: Soft, non-distended.  Extremities: No clubbing, cyanosis or edema. PT/Radials 2+ and equal bilaterally. Psych: Normal affect. Neuro: Alert and oriented X 3. Moves all extremities spontaneously.   Diagnostics Echocardiogram March 2013 Study Conclusions - Left ventricle: The cavity size was normal. Systolic function was mildly reduced. The estimated ejection fraction was  in the range of 45% to 50%. Diffuse hypokinesis. - Aortic valve: Mild regurgitation. - Left atrium: The atrium was mildly dilated. - Atrial septum: No defect or patent foramen ovale was identified. Device interrogation today - quick look - confirmed ERT reached 10/26/2013  Assessment and Plan PPM battery at ERI Tachy-brady syndrome s/p PPM implant 2001, gen change 2006 ([place]) Atrial fibrillation LVEF 45-50% by echo March 2013  Discussed need for PPM gen change including procedure involved, risks and benefits Risks of PPM gen change include but are not limited to bleeding and infection Obtain echo results from Dr. Terrence Dupont; explained to [name]  that if LVEF if further reduced will need to discuss / consider CRT-P upgrade Scheduled PPM gen change with Dr. Klein on 11/28/2013 As discussed previously, consider discontinuing digoxin in setting of near-normal LVEF and will defer decision regarding anticoagulation to primary cardiologist, Dr. Harwani  Signed, Ocie Tino O Kailea Dannemiller, [name] 11/23/2013, 6:22 PM    

## 2013-11-23 NOTE — Patient Instructions (Addendum)
Your Physician recommends you have a Generator change. It is recommended that you replace a pacemaker generator that is at the end of its service life. The remaining lifespan of a pacemaker is determined during visits to the Pacemaker Clinic. The battery in a pacemaker does not stop suddenly but rather loses its charge slowly, which lets the cardiologist plan the replacement date. Duration This procedure takes less than one hour. The total hospital stay is approximately 24 to 48 hours.  Your physician recommends that you have lab work TODAY :CBC,BMET,INR   Your physician recommends that you continue on your current medications as directed. Please refer to the Current Medication list given to you today.

## 2013-11-24 ENCOUNTER — Encounter: Payer: Medicare Other | Admitting: Cardiology

## 2013-11-25 ENCOUNTER — Encounter (HOSPITAL_COMMUNITY): Payer: Self-pay | Admitting: Pharmacy Technician

## 2013-11-27 DIAGNOSIS — I4891 Unspecified atrial fibrillation: Secondary | ICD-10-CM | POA: Diagnosis not present

## 2013-11-27 DIAGNOSIS — N289 Disorder of kidney and ureter, unspecified: Secondary | ICD-10-CM | POA: Diagnosis not present

## 2013-11-27 DIAGNOSIS — I509 Heart failure, unspecified: Secondary | ICD-10-CM | POA: Diagnosis not present

## 2013-11-27 DIAGNOSIS — I495 Sick sinus syndrome: Secondary | ICD-10-CM | POA: Diagnosis not present

## 2013-11-27 DIAGNOSIS — Z79899 Other long term (current) drug therapy: Secondary | ICD-10-CM | POA: Diagnosis not present

## 2013-11-27 DIAGNOSIS — I10 Essential (primary) hypertension: Secondary | ICD-10-CM | POA: Diagnosis not present

## 2013-11-27 DIAGNOSIS — Z45018 Encounter for adjustment and management of other part of cardiac pacemaker: Secondary | ICD-10-CM | POA: Diagnosis not present

## 2013-11-27 DIAGNOSIS — Z87891 Personal history of nicotine dependence: Secondary | ICD-10-CM | POA: Diagnosis not present

## 2013-11-27 MED ORDER — SODIUM CHLORIDE 0.9 % IV SOLN
INTRAVENOUS | Status: DC
  Administered 2013-11-28: 11:00:00 via INTRAVENOUS

## 2013-11-27 MED ORDER — CHLORHEXIDINE GLUCONATE 4 % EX LIQD
60.0000 mL | Freq: Once | CUTANEOUS | Status: DC
  Filled 2013-11-27: qty 60

## 2013-11-27 MED ORDER — CEFAZOLIN SODIUM-DEXTROSE 2-3 GM-% IV SOLR: 2.0000 g | INTRAVENOUS | Status: AC

## 2013-11-27 MED ORDER — SODIUM CHLORIDE 0.9 % IR SOLN
80.0000 mg | Status: AC
  Filled 2013-11-27: qty 2

## 2013-11-28 ENCOUNTER — Ambulatory Visit (HOSPITAL_COMMUNITY)
Admission: RE | Admit: 2013-11-28 | Discharge: 2013-11-28 | Disposition: A | Discharge status: Home / Self Care | Payer: Medicare Other | Source: Ambulatory Visit | Attending: Internal Medicine | Admitting: Internal Medicine

## 2013-11-28 ENCOUNTER — Encounter: Payer: Self-pay | Admitting: *Deleted

## 2013-11-28 ENCOUNTER — Encounter (HOSPITAL_COMMUNITY)
Admission: RE | Disposition: A | Discharge status: Home / Self Care | Payer: Self-pay | Source: Ambulatory Visit | Attending: Internal Medicine

## 2013-11-28 DIAGNOSIS — N289 Disorder of kidney and ureter, unspecified: Secondary | ICD-10-CM | POA: Insufficient documentation

## 2013-11-28 DIAGNOSIS — I10 Essential (primary) hypertension: Secondary | ICD-10-CM | POA: Insufficient documentation

## 2013-11-28 DIAGNOSIS — Z79899 Other long term (current) drug therapy: Secondary | ICD-10-CM | POA: Insufficient documentation

## 2013-11-28 DIAGNOSIS — Z87891 Personal history of nicotine dependence: Secondary | ICD-10-CM | POA: Insufficient documentation

## 2013-11-28 DIAGNOSIS — I495 Sick sinus syndrome: Secondary | ICD-10-CM | POA: Insufficient documentation

## 2013-11-28 DIAGNOSIS — I509 Heart failure, unspecified: Secondary | ICD-10-CM | POA: Insufficient documentation

## 2013-11-28 DIAGNOSIS — Z45018 Encounter for adjustment and management of other part of cardiac pacemaker: Secondary | ICD-10-CM | POA: Insufficient documentation

## 2013-11-28 DIAGNOSIS — I4891 Unspecified atrial fibrillation: Secondary | ICD-10-CM | POA: Insufficient documentation

## 2013-11-28 HISTORY — PX: PACEMAKER GENERATOR CHANGE: SHX5481

## 2013-11-28 LAB — SURGICAL PCR SCREEN
MRSA, PCR: NEGATIVE
Staphylococcus aureus: NEGATIVE

## 2013-11-28 SURGERY — PACEMAKER GENERATOR CHANGE
Anesthesia: LOCAL

## 2013-11-28 MED ORDER — MIDAZOLAM HCL 5 MG/5ML IJ SOLN
INTRAMUSCULAR | Status: AC
  Filled 2013-11-28: qty 5

## 2013-11-28 MED ORDER — MUPIROCIN 2 % EX OINT
TOPICAL_OINTMENT | Freq: Once | CUTANEOUS | Status: AC
  Administered 2013-11-28: 1 via NASAL
  Filled 2013-11-28: qty 22

## 2013-11-28 MED ORDER — CHLORHEXIDINE GLUCONATE 4 % EX LIQD
60.0000 mL | Freq: Once | CUTANEOUS | Status: DC
  Filled 2013-11-28: qty 60

## 2013-11-28 MED ORDER — CEFAZOLIN SODIUM-DEXTROSE 2-3 GM-% IV SOLR
2.0000 g | INTRAVENOUS | Status: DC
Start: 2013-11-28 — End: 2013-11-28

## 2013-11-28 MED ORDER — GENTAMICIN SULFATE 40 MG/ML IJ SOLN
Freq: Once | INTRAMUSCULAR | Status: DC
Start: 2013-11-28 — End: 2013-11-28
  Filled 2013-11-28: qty 2

## 2013-11-28 MED ORDER — GENTAMICIN SULFATE 40 MG/ML IJ SOLN
80.0000 mg | INTRAMUSCULAR | Status: DC
Start: 2013-11-28 — End: 2013-11-28

## 2013-11-28 MED ORDER — MUPIROCIN 2 % EX OINT
TOPICAL_OINTMENT | CUTANEOUS | Status: AC
  Administered 2013-11-28: 1 via NASAL
  Filled 2013-11-28: qty 22

## 2013-11-28 MED ORDER — FENTANYL CITRATE 0.05 MG/ML IJ SOLN
INTRAMUSCULAR | Status: AC
  Filled 2013-11-28: qty 2

## 2013-11-28 MED ORDER — LIDOCAINE HCL (PF) 1 % IJ SOLN
INTRAMUSCULAR | Status: AC
Start: 2013-11-28 — End: 2013-11-28
  Filled 2013-11-28: qty 60

## 2013-11-28 NOTE — CV Procedure (Signed)
Preoperative diagnosis  afib slow ventricular response  Previous Pacer at [facility] Postoperative diagnosis same/   Procedure: Generator replacement  Pocket revision  Following informed consent the patient was brought to the electrophysiology laboratory in place of the fluoroscopic table in the supine position after routine prep and drape lidocaine was infiltrated in the region of the previous incision and carried down to later the device pocket using sharp dissection and electrocautery. The pocket was opened the device was freed up and was explanted.  Interrogation of the previously implanted ventricular lead [employer]onic [id number]  demonstrated an R wave of 13.8  millivolts., and impedance of 427 ohms, and a pacing threshold of 0.6 volts at 0.5 msec.       The lead was inspected.and was then attached to a [employer]  advantio pulse generator, serial number [id number].    The pocket was expanded medially and caudally to allow for housing o f the larger device with unwinding of the pacing lead  The pocket was irrigated with antibiotic containing saline solution hemostasis was assured and the leads and the device were placed in the pocket. The wound was then closed in 3 layers in normal fashion.  The patient tolerated the procedure without apparent complication.  Deboraha Sprang

## 2013-11-28 NOTE — Progress Notes (Signed)
Patient ID: [name], male   DOB: 02/06/1927, 80 y.o.   MRN: [id number]   Spoke with Dr Harwani's office.  Echo from 03-2013 demonstrated EF 50%. They will fax over full report.  Aaiden Depoy K Desa Rech, RN 11/28/2013 8:45 AM  

## 2013-11-28 NOTE — Discharge Instructions (Signed)
Pacemaker Battery Change, Care After  °Refer to this sheet in the next few weeks. These instructions provide you with information on caring for yourself after your procedure. Your health care provider may also give you more specific instructions. Your treatment has been planned according to current medical practices, but problems sometimes occur. Call your health care provider if you have any problems or questions after your procedure. °WHAT TO EXPECT AFTER THE PROCEDURE °After your procedure, it is typical to have the following sensations: °· Soreness at the pacemaker site. °HOME CARE INSTRUCTIONS  °· Keep the incision clean and dry. °· Unless advised otherwise, you may shower beginning 48 hours after your procedure. °· For the first week after the replacement, avoid stretching motions that pull at the incision site and avoid heavy exercise with the arm on the same side as the incision. °· Only take over-the-counter or prescription medicines for pain, discomfort, or fever as directed by your health care provider. °· Your health care provider will tell you when you will need to next test your pacemaker by telephone or when to return to the office for follow up for removal of stitches. °SEEK MEDICAL CARE IF:  °· You have pain at the incision site that is not relieved by over-the-counter or prescription medicine. °· There is drainage or pus from the incision site. °· There is swelling larger than a lime at the incision site. °· You develop red streaking that extends above or below the incision site. °· You feel brief, intermittent palpitations, lightheadedness, or any symptoms that you feel might be related to your heart. °SEEK IMMEDIATE MEDICAL CARE IF:  °· You experience chest pain that is different than the pain at the pacemaker site. °· Shortness of breath. °· Palpitations or irregular heart beat. °· Lightheadedness that does not go away quickly. °· Fainting. °· You have pain that gets worse and is not relieved by  medicine. °MAKE SURE YOU:  °· Understand these instructions. °· Will watch your condition. °· Will get help right away if you are not doing well or get worse. °Document Released: 05/04/2013 Document Reviewed: 01/26/2013 °[employer]® Patient Information ©2014 [employer]. ° °

## 2013-11-28 NOTE — H&P (View-Only) (Signed)
Patient ID: [name], male   DOB: 12-08-1926, 80 y.o.   MRN: [id number]   Spoke with Dr Zenia Resides office.  Echo from 03-2013 demonstrated EF 50%. They will fax over full report.  Patsey Berthold, RN 11/28/2013 8:45 AM

## 2013-11-28 NOTE — H&P (View-Only) (Signed)
ELECTROPHYSIOLOGY OFFICE NOTE   Patient ID: [name] MRN: [id number], DOB/AGE: 78/08/1926   Date of Visit: 11/23/2013  Primary Cardiologist: Terrence Dupont, MD Reason for Visit: EP/device follow-up  History of Present Illness  [name] is a 80 y.o. male with tachy-brady syndrome s/p PPM implant 2001, PPM gen change 2006 and atrial fibrillation who presents today for routine electrophysiology followup. His PPM battery has reached ERI. He was last seen in follow-up by Dr. Caryl Comes one month ago. Since last being seen in our clinic, he reports he is doing well and has no complaints. He only complains of arthritic pain today. He denies chest pain or shortness of breath. He denies palpitations, dizziness, near syncope or syncope. He denies LE swelling, orthopnea or PND. He reports compliance with medications. Of note, he tells me Dr. Terrence Dupont stopped his Coumadin due to increased falls and he now takes aspirin only. Dr. Caryl Comes has discussed other options for anticoagulation with him but deferred the decision to his primary cardiologist, Dr. Terrence Dupont. In addition, he continues to take digoxin despite normal LVEF.  Past Medical History Past Medical History  Diagnosis Date  . CHF (congestive heart failure)   . Atrial fibrillation   . HTN (hypertension)   . Pacemaker     [employer]   . Arthritis     incl spinal stenosis  . Renal insufficiency     question degree  . Wears partial dentures     upper partial  . Wears glasses     reading    Past Surgical History Past Surgical History  Procedure Laterality Date  . Bilateral shoulder surgery    . Tonsillectomy    . Prosthetic hip - bilaterally    . R knee quadricep tendon repair    . I&d extremity  12/07/2011    Procedure: IRRIGATION AND DEBRIDEMENT EXTREMITY;  Surgeon: Tennis Must, MD;  Location: [facility];  Service: Orthopedics;  Laterality: Left;  Marland Kitchen Eye surgery      both catararcts  . Trigger finger release Right 05/26/2013   Procedure: RIGHT SMALL TRIGGER RELEASE ;  Surgeon: Tennis Must, MD;  Location: [facility];  Service: Orthopedics;  Laterality: Right;    Allergies/Intolerances Allergies  Allergen Reactions  . Amiodarone     REACTION: fluid on lungs    Current Home Medications Current Outpatient Prescriptions  Medication Sig Dispense Refill  . acetaminophen (TYLENOL) 325 MG tablet Take 650 mg by mouth 2 (two) times daily.      . B Complex-C (B-COMPLEX WITH VITAMIN C) tablet Take 1 tablet by mouth every morning.       . carvedilol (COREG) 3.125 MG tablet Take 3.125-6.25 mg by mouth 2 (two) times daily with a meal. He takes as needed if his blood pressure is >140 he takes two tablets, if it is < 100 he takes one tablet and if it is 120 he does not take any tablets.      . digoxin (LANOXIN) 0.125 MG tablet Take 125 mcg by mouth at bedtime.      . finasteride (PROSCAR) 5 MG tablet Take 5 mg by mouth every morning.       . furosemide (LASIX) 20 MG tablet Take 1 tablet (20 mg total) by mouth daily.  30 tablet  3  . lisinopril (PRINIVIL,ZESTRIL) 10 MG tablet Take 10 mg by mouth at bedtime.      . Multiple Vitamin (MULITIVITAMIN WITH MINERALS) TABS Take 1 tablet by mouth 2 (two) times  daily.       . Omega-3 Fatty Acids (FISH OIL) 1000 MG CAPS Take 2 capsules by mouth 2 (two) times daily.       . pravastatin (PRAVACHOL) 20 MG tablet Take 20 mg by mouth at bedtime.        . Tamsulosin HCl (FLOMAX) 0.4 MG CAPS Take 0.8 mg by mouth 2 (two) times daily at 10 AM and 5 PM.       . Thiamine HCl (VITAMIN B-1) 100 MG tablet Take 100 mg by mouth daily.        Marland Kitchen zolpidem (AMBIEN) 10 MG tablet Take 10 mg by mouth at bedtime.      . [DISCONTINUED] buPROPion (WELLBUTRIN) 100 MG tablet Take 200 mg by mouth daily.        . [DISCONTINUED] warfarin (COUMADIN) 2 MG tablet Take 2 mg by mouth daily. 1 tablet on Sunday, Tuesday, Wednesday, Friday, and Saturday 1/2 tablet on Monday and Thursday       No current  facility-administered medications for this visit.    Social History History   Social History  . Marital Status: Widowed    Spouse Name: N/A    Number of Children: N/A  . Years of Education: N/A   Occupational History  . Not on file.   Social History Main Topics  . Smoking status: Former Smoker    Types: Cigarettes    Quit date: 05/13/1971  . Smokeless tobacco: Never Used  . Alcohol Use: Yes     Comment: occasional  . Drug Use: No  . Sexual Activity: Not on file   Other Topics Concern  . Not on file   Social History Narrative  . No narrative on file     Review of Systems General: No chills, fever, night sweats or weight changes Cardiovascular: No chest pain, dyspnea on exertion, edema, orthopnea, palpitations, paroxysmal nocturnal dyspnea Dermatological: No rash, lesions or masses Respiratory: No cough, dyspnea Urologic: No hematuria, dysuria Abdominal: No nausea, vomiting, diarrhea, bright red blood per rectum, melena, or hematemesis Neurologic: No visual changes, weakness, changes in mental status All other systems reviewed and are otherwise negative except as noted above.  Physical Exam Vitals: Blood pressure 124/70, pulse 64, weight 174 lb (78.926 kg).  General: Well developed, elderly appearing 80 y.o. male in no acute distress. HEENT: Normocephalic, atraumatic. EOMs intact. Sclera nonicteric. Oropharynx clear.  Neck: Supple. No JVD. Lungs: Respirations regular and unlabored, CTA bilaterally. No wheezes, rales or rhonchi. Heart: Regular. S1, S2 present. No murmurs, rub, S3 or S4. Abdomen: Soft, non-distended.  Extremities: No clubbing, cyanosis or edema. PT/Radials 2+ and equal bilaterally. Psych: Normal affect. Neuro: Alert and oriented X 3. Moves all extremities spontaneously.   Diagnostics Echocardiogram March 2013 Study Conclusions - Left ventricle: The cavity size was normal. Systolic function was mildly reduced. The estimated ejection fraction was  in the range of 45% to 50%. Diffuse hypokinesis. - Aortic valve: Mild regurgitation. - Left atrium: The atrium was mildly dilated. - Atrial septum: No defect or patent foramen ovale was identified. Device interrogation today - quick look - confirmed ERT reached 10/26/2013  Assessment and Plan PPM battery at ERI Tachy-brady syndrome s/p PPM implant 2001, gen change 2006 ([place]) Atrial fibrillation LVEF 45-50% by echo March 2013  Discussed need for PPM gen change including procedure involved, risks and benefits Risks of PPM gen change include but are not limited to bleeding and infection Obtain echo results from Dr. Terrence Dupont; explained to Mr. Renstrom  that if LVEF if further reduced will need to discuss / consider CRT-P upgrade Scheduled PPM gen change with Dr. Caryl Comes on 11/28/2013 As discussed previously, consider discontinuing digoxin in setting of near-normal LVEF and will defer decision regarding anticoagulation to primary cardiologist, Dr. Terrence Dupont  Signed, Andrez Grime, [name] 11/23/2013, 6:22 PM

## 2013-11-28 NOTE — Interval H&P Note (Signed)
History and Physical Interval Note:  11/28/2013 10:35 AM  [name]  has presented today for surgery, with the diagnosis of Battery Depletion  The various methods of treatment have been discussed with the patient and family. After consideration of risks, benefits and other options for treatment, the patient has consented to  Procedure(s): PACEMAKER GENERATOR CHANGE (N/A) as a surgical intervention .  The patient's history has been reviewed, patient examined, no change in status, stable for surgery.  I have reviewed the patient's chart and labs.  Questions were answered to the patient's satisfaction.     Deboraha Sprang

## 2013-11-28 NOTE — Interval H&P Note (Signed)
History and Physical Interval Note:  11/28/2013 10:35 AM  [name]  has presented today for surgery, with the diagnosis of Battery Depletion  The various methods of treatment have been discussed with the patient and family. After consideration of risks, benefits and other options for treatment, the patient has consented to  Procedure(s): PACEMAKER GENERATOR CHANGE (N/A) as a surgical intervention .  The patient's history has been reviewed, patient examined, no change in status, stable for surgery.  I have reviewed the patient's chart and labs.  Questions were answered to the patient's satisfaction.     [name] [name]   

## 2013-12-02 ENCOUNTER — Encounter (HOSPITAL_COMMUNITY): Payer: Self-pay | Admitting: *Deleted

## 2013-12-09 ENCOUNTER — Encounter: Payer: Self-pay | Admitting: Internal Medicine

## 2013-12-12 ENCOUNTER — Ambulatory Visit: Payer: Medicare Other

## 2013-12-13 ENCOUNTER — Encounter: Payer: Medicare Other | Admitting: Internal Medicine

## 2013-12-14 ENCOUNTER — Ambulatory Visit (INDEPENDENT_AMBULATORY_CARE_PROVIDER_SITE_OTHER): Payer: Medicare Other | Admitting: *Deleted

## 2013-12-14 DIAGNOSIS — Z950 Presence of cardiac pacemaker: Secondary | ICD-10-CM | POA: Diagnosis not present

## 2013-12-14 DIAGNOSIS — I4891 Unspecified atrial fibrillation: Secondary | ICD-10-CM | POA: Diagnosis not present

## 2013-12-14 LAB — MDC_IDC_ENUM_SESS_TYPE_INCLINIC
Brady Statistic RV Percent Paced: 69 %
Implantable Pulse Generator Serial Number: 390662
Lead Channel Impedance Value: 423 Ohm
Lead Channel Pacing Threshold Amplitude: 0.9 V
Lead Channel Pacing Threshold Pulse Width: 0.4 ms
Lead Channel Sensing Intrinsic Amplitude: 12.3 mV

## 2013-12-14 NOTE — Progress Notes (Signed)
Wound check appointment. Steri-strips removed. Wound without redness or edema. Incision edges approximated, wound well healed. Normal device function. Thresholds, sensing, and impedances consistent with implant measurements. Device programmed at chronic lead settings. Histogram distribution appropriate for patient and level of activity. 1 NSVT---6 beats. Patient educated about wound care, arm mobility, lifting restrictions. ROV w/ Jerene Pitch 03/21/14.

## 2013-12-20 ENCOUNTER — Encounter: Payer: Medicare Other | Admitting: Cardiology

## 2013-12-21 DIAGNOSIS — Z79899 Other long term (current) drug therapy: Secondary | ICD-10-CM | POA: Diagnosis not present

## 2013-12-21 DIAGNOSIS — G894 Chronic pain syndrome: Secondary | ICD-10-CM | POA: Diagnosis not present

## 2013-12-21 DIAGNOSIS — M5137 Other intervertebral disc degeneration, lumbosacral region: Secondary | ICD-10-CM | POA: Diagnosis not present

## 2014-01-05 ENCOUNTER — Encounter: Payer: Self-pay | Admitting: Internal Medicine

## 2014-01-11 ENCOUNTER — Encounter: Payer: Self-pay | Admitting: Cardiology

## 2014-03-02 DIAGNOSIS — I428 Other cardiomyopathies: Secondary | ICD-10-CM | POA: Diagnosis not present

## 2014-03-02 DIAGNOSIS — I509 Heart failure, unspecified: Secondary | ICD-10-CM | POA: Diagnosis not present

## 2014-03-02 DIAGNOSIS — I495 Sick sinus syndrome: Secondary | ICD-10-CM | POA: Diagnosis not present

## 2014-03-02 DIAGNOSIS — E785 Hyperlipidemia, unspecified: Secondary | ICD-10-CM | POA: Diagnosis not present

## 2014-03-02 DIAGNOSIS — I10 Essential (primary) hypertension: Secondary | ICD-10-CM | POA: Diagnosis not present

## 2014-03-02 DIAGNOSIS — I4891 Unspecified atrial fibrillation: Secondary | ICD-10-CM | POA: Diagnosis not present

## 2014-03-02 DIAGNOSIS — I2510 Atherosclerotic heart disease of native coronary artery without angina pectoris: Secondary | ICD-10-CM | POA: Diagnosis not present

## 2014-03-02 DIAGNOSIS — M159 Polyosteoarthritis, unspecified: Secondary | ICD-10-CM | POA: Diagnosis not present

## 2014-03-21 ENCOUNTER — Encounter: Payer: Medicare Other | Admitting: Cardiology

## 2014-03-28 ENCOUNTER — Encounter: Payer: Medicare Other | Admitting: Internal Medicine

## 2014-03-31 ENCOUNTER — Encounter: Payer: Self-pay | Admitting: Internal Medicine

## 2014-04-05 ENCOUNTER — Encounter: Payer: Self-pay | Admitting: Internal Medicine

## 2014-04-05 ENCOUNTER — Ambulatory Visit (INDEPENDENT_AMBULATORY_CARE_PROVIDER_SITE_OTHER): Payer: Medicare Other | Admitting: Internal Medicine

## 2014-04-05 VITALS — BP 104/48 | HR 60 | Ht 68.0 in | Wt 172.0 lb

## 2014-04-05 DIAGNOSIS — Z950 Presence of cardiac pacemaker: Secondary | ICD-10-CM | POA: Diagnosis not present

## 2014-04-05 DIAGNOSIS — I495 Sick sinus syndrome: Secondary | ICD-10-CM | POA: Diagnosis not present

## 2014-04-05 LAB — MDC_IDC_ENUM_SESS_TYPE_INCLINIC
Brady Statistic RV Percent Paced: 79 %
Date Time Interrogation Session: 20150909040000
Implantable Pulse Generator Serial Number: 390662
Lead Channel Impedance Value: 426 Ohm
Lead Channel Pacing Threshold Amplitude: 0.7 V
Lead Channel Pacing Threshold Pulse Width: 0.4 ms
Lead Channel Sensing Intrinsic Amplitude: 8.6 mV
Lead Channel Setting Pacing Amplitude: 1.2 V
Lead Channel Setting Pacing Pulse Width: 0.4 ms
Lead Channel Setting Sensing Sensitivity: 2.5 mV
Zone Setting Detection Interval: 375 ms

## 2014-04-05 NOTE — Progress Notes (Signed)
Patient Care Team: Clent Demark, MD as PCP - General (Internal Medicine)   HPI  [name] is a 80 y.o. male seen in followup for pacer implanted for tachybradycardia syndrome in 2001 with device generator replacement in 2006.   At his last visit I asked him to followup with Dr. Ophelia Charter concerning questions of digoxin in patients with normal LV function and anticoagulation with antiplatelet therapy.  He remains on aspirin  He previously took Coumadin; but has stopped it because he was once it  .  Echo 2013-March EF 45-50%. He is able to climb 3 flts of stairs twice,     Denies significant shortness of breath or chest pain. He does have arthritis  He has had a stress test by history, but never a cath. Denies prior MI    Past Medical History  Diagnosis Date  . CHF (congestive heart failure)   . Atrial fibrillation   . HTN (hypertension)   . Arthritis     incl spinal stenosis  . Renal insufficiency     question degree  . Wears partial dentures     upper partial  . Wears glasses     reading    Past Surgical History  Procedure Laterality Date  . Bilateral shoulder surgery    . Tonsillectomy    . Prosthetic hip - bilaterally    . R knee quadricep tendon repair    . I&d extremity  12/07/2011    Procedure: IRRIGATION AND DEBRIDEMENT EXTREMITY;  Surgeon: Tennis Must, MD;  Location: [facility];  Service: Orthopedics;  Laterality: Left;  Marland Kitchen Eye surgery      both catararcts  . Trigger finger release Right 05/26/2013    Procedure: RIGHT SMALL TRIGGER RELEASE ;  Surgeon: Tennis Must, MD;  Location: [facility];  Service: Orthopedics;  Laterality: Right;  . Pacemaker insertion      BSX pacemaker generator change 11-28-2013 by Dr Caryl Comes    Current Outpatient Prescriptions  Medication Sig Dispense Refill  . acetaminophen (TYLENOL) 500 MG tablet Take 1,000 mg by mouth 2 (two) times daily. scheduled      . amoxicillin (AMOXIL) 500 MG capsule Take 500 mg by  mouth See admin instructions. Take 4 capsules (2000 mg) by mouth prior to dental appointment      . Apoaequorin (PREVAGEN) 10 MG CAPS Take 1 tablet by mouth daily.      . Ascorbic Acid (VITAMIN C) 1000 MG tablet Take 1,000 mg by mouth daily.      Marland Kitchen aspirin EC 81 MG tablet Take 81 mg by mouth daily.      Marland Kitchen b complex vitamins tablet Take 1 tablet by mouth daily.      . beta carotene w/minerals (OCUVITE) tablet Take 1 tablet by mouth 2 (two) times daily.      Marland Kitchen CALCIUM CITRATE PO Take 630 mg by mouth daily.      . carvedilol (COREG) 3.125 MG tablet Take 3.125 mg by mouth 2 (two) times daily as needed (high blood pressure). If blood pressure under 100 do not take any of this medication, between 100 and 140 take 1 tablet (3.125 mg), over 140 take 2 tablets (6.25 mg)      . Cholecalciferol (VITAMIN D-3) 5000 UNITS TABS Take 5,000 Units by mouth daily.      . finasteride (PROSCAR) 5 MG tablet Take 5 mg by mouth daily.       . furosemide (LASIX) 20  MG tablet Take 20 mg by mouth daily.      . Mouthwashes (BIOTENE DRY MOUTH MT) Use as directed 10 mLs in the mouth or throat daily as needed (dry mouth).      . Multiple Vitamin (MULITIVITAMIN WITH MINERALS) TABS Take 1 tablet by mouth daily.       . Omega-3 Fatty Acids (FISH OIL) 1200 MG CAPS Take 2,400 mg by mouth 2 (two) times daily.      . polyvinyl alcohol (LIQUIFILM TEARS) 1.4 % ophthalmic solution Place 1 drop into both eyes 3 (three) times daily.      . pravastatin (PRAVACHOL) 40 MG tablet Take 40 mg by mouth at bedtime.      . Sodium Fluoride (FLUORIDEX DAILY DEFENSE DT) Place 1 application onto teeth daily.      . Tamsulosin HCl (FLOMAX) 0.4 MG CAPS Take 0.8 mg by mouth 2 (two) times daily.       . traMADol (ULTRAM) 50 MG tablet Take 100 mg by mouth 2 (two) times daily. scheduled      . zolpidem (AMBIEN) 10 MG tablet Take 10 mg by mouth at bedtime as needed for sleep.       . [DISCONTINUED] buPROPion (WELLBUTRIN) 100 MG tablet Take 200 mg by mouth  daily.        . [DISCONTINUED] warfarin (COUMADIN) 2 MG tablet Take 2 mg by mouth daily. 1 tablet on Sunday, Tuesday, Wednesday, Friday, and Saturday 1/2 tablet on Monday and Thursday       No current facility-administered medications for this visit.    Allergies  Allergen Reactions  . Amiodarone Other (See Comments)     fluid on lungs    Review of Systems negative except from HPI and PMH  Physical Exam BP 104/48  Pulse 60  Ht 5\' 8"  (1.727 m)  Wt 172 lb (78.019 kg)  BMI 26.16 kg/m2 Well developed and well nourished in no acute distress HENT normal E scleral and icterus clear Neck Supple JVP flat; carotids brisk and full Clear to ausculation The patient's device was interrogated.  The information was reviewed. No changes were made in the programming.   *Regular rate and rhythm, no murmurs gallops or rub Soft with active bowel sounds No clubbing cyanosis  Edema Alert and oriented, grossly normal motor and sensory function  Walks with walker Skin Warm and Dry   ECG demonstrates atrial fibrillation with left bundle branch block Heart rate 60 Intervals-/15/43   Assessment and  Plan  Atrial fibrillation-permanent  Bradycardia-device dependent  Pacemaker  [employer] The patient's device was interrogated.  The information was reviewed. No changes were made in the programming.     Functionally he is doing quite well. We have discussed again the role of anticoagulation it is suggested that Coumadin is hard better than aspirin as he cannot afford a NOAC  We will also have him stop his digoxin given the control of his ventricular rate that is evident and his improved LV function.

## 2014-04-05 NOTE — Patient Instructions (Addendum)
Dr. Caryl Comes recommends: Stopping aspirin, stopping digoxin and starting coumadin - THIS WILL BE LEFT UP TO YOUR OTHER PHYSICIAN.  Remote monitoring is used to monitor your Pacemaker of ICD from home. This monitoring reduces the number of office visits required to check your device to one time per year. It allows [place] to keep an eye on the functioning of your device to ensure it is working properly. You are scheduled for a device check from home on 12.17.2015. You may send your transmission at any time that day. If you have a wireless device, the transmission will be sent automatically. After your physician reviews your transmission, you will receive a postcard with your next transmission date.    Your physician wants you to follow-up in: 12 months with Dr. Gari Crown will receive a reminder letter in the mail two months in advance. If you don't receive a letter, please call our office to schedule the follow-up appointment.

## 2014-06-01 DIAGNOSIS — F17290 Nicotine dependence, other tobacco product, uncomplicated: Secondary | ICD-10-CM | POA: Diagnosis not present

## 2014-06-01 DIAGNOSIS — I482 Chronic atrial fibrillation: Secondary | ICD-10-CM | POA: Diagnosis not present

## 2014-06-01 DIAGNOSIS — I509 Heart failure, unspecified: Secondary | ICD-10-CM | POA: Diagnosis not present

## 2014-06-01 DIAGNOSIS — M1990 Unspecified osteoarthritis, unspecified site: Secondary | ICD-10-CM | POA: Diagnosis not present

## 2014-06-01 DIAGNOSIS — E785 Hyperlipidemia, unspecified: Secondary | ICD-10-CM | POA: Diagnosis not present

## 2014-06-01 DIAGNOSIS — I10 Essential (primary) hypertension: Secondary | ICD-10-CM | POA: Diagnosis not present

## 2014-06-01 DIAGNOSIS — I2510 Atherosclerotic heart disease of native coronary artery without angina pectoris: Secondary | ICD-10-CM | POA: Diagnosis not present

## 2014-06-16 ENCOUNTER — Emergency Department (HOSPITAL_COMMUNITY)
Admission: EM | Admit: 2014-06-16 | Discharge: 2014-06-16 | Disposition: A | Discharge status: Home / Self Care | Payer: Medicare Other | Attending: Emergency Medicine | Admitting: Emergency Medicine

## 2014-06-16 ENCOUNTER — Emergency Department (HOSPITAL_COMMUNITY): Payer: Medicare Other

## 2014-06-16 ENCOUNTER — Encounter (HOSPITAL_COMMUNITY): Payer: Self-pay | Admitting: Family Medicine

## 2014-06-16 DIAGNOSIS — S20219A Contusion of unspecified front wall of thorax, initial encounter: Secondary | ICD-10-CM | POA: Diagnosis not present

## 2014-06-16 DIAGNOSIS — S12090A Other displaced fracture of first cervical vertebra, initial encounter for closed fracture: Secondary | ICD-10-CM | POA: Diagnosis not present

## 2014-06-16 DIAGNOSIS — R1030 Lower abdominal pain, unspecified: Secondary | ICD-10-CM | POA: Diagnosis not present

## 2014-06-16 DIAGNOSIS — M542 Cervicalgia: Secondary | ICD-10-CM | POA: Diagnosis not present

## 2014-06-16 DIAGNOSIS — Y998 Other external cause status: Secondary | ICD-10-CM | POA: Insufficient documentation

## 2014-06-16 DIAGNOSIS — S12000A Unspecified displaced fracture of first cervical vertebra, initial encounter for closed fracture: Secondary | ICD-10-CM | POA: Diagnosis not present

## 2014-06-16 DIAGNOSIS — I10 Essential (primary) hypertension: Secondary | ICD-10-CM | POA: Insufficient documentation

## 2014-06-16 DIAGNOSIS — Y92190 Kitchen in other specified residential institution as the place of occurrence of the external cause: Secondary | ICD-10-CM | POA: Insufficient documentation

## 2014-06-16 DIAGNOSIS — S12100A Unspecified displaced fracture of second cervical vertebra, initial encounter for closed fracture: Secondary | ICD-10-CM | POA: Diagnosis not present

## 2014-06-16 DIAGNOSIS — I509 Heart failure, unspecified: Secondary | ICD-10-CM | POA: Insufficient documentation

## 2014-06-16 DIAGNOSIS — Z79899 Other long term (current) drug therapy: Secondary | ICD-10-CM | POA: Diagnosis not present

## 2014-06-16 DIAGNOSIS — W010XXA Fall on same level from slipping, tripping and stumbling without subsequent striking against object, initial encounter: Secondary | ICD-10-CM | POA: Insufficient documentation

## 2014-06-16 DIAGNOSIS — S4992XA Unspecified injury of left shoulder and upper arm, initial encounter: Secondary | ICD-10-CM | POA: Diagnosis not present

## 2014-06-16 DIAGNOSIS — Y9389 Activity, other specified: Secondary | ICD-10-CM | POA: Insufficient documentation

## 2014-06-16 DIAGNOSIS — Z7901 Long term (current) use of anticoagulants: Secondary | ICD-10-CM | POA: Insufficient documentation

## 2014-06-16 DIAGNOSIS — S0990XA Unspecified injury of head, initial encounter: Secondary | ICD-10-CM | POA: Diagnosis not present

## 2014-06-16 DIAGNOSIS — Z87891 Personal history of nicotine dependence: Secondary | ICD-10-CM | POA: Insufficient documentation

## 2014-06-16 DIAGNOSIS — S129XXA Fracture of neck, unspecified, initial encounter: Secondary | ICD-10-CM | POA: Diagnosis not present

## 2014-06-16 DIAGNOSIS — Z950 Presence of cardiac pacemaker: Secondary | ICD-10-CM | POA: Insufficient documentation

## 2014-06-16 DIAGNOSIS — S12190A Other displaced fracture of second cervical vertebra, initial encounter for closed fracture: Secondary | ICD-10-CM | POA: Diagnosis not present

## 2014-06-16 DIAGNOSIS — S3991XA Unspecified injury of abdomen, initial encounter: Secondary | ICD-10-CM | POA: Insufficient documentation

## 2014-06-16 DIAGNOSIS — I4891 Unspecified atrial fibrillation: Secondary | ICD-10-CM | POA: Diagnosis not present

## 2014-06-16 DIAGNOSIS — M1990 Unspecified osteoarthritis, unspecified site: Secondary | ICD-10-CM | POA: Insufficient documentation

## 2014-06-16 DIAGNOSIS — S4991XA Unspecified injury of right shoulder and upper arm, initial encounter: Secondary | ICD-10-CM | POA: Diagnosis not present

## 2014-06-16 DIAGNOSIS — S299XXA Unspecified injury of thorax, initial encounter: Secondary | ICD-10-CM | POA: Diagnosis not present

## 2014-06-16 DIAGNOSIS — Z792 Long term (current) use of antibiotics: Secondary | ICD-10-CM | POA: Insufficient documentation

## 2014-06-16 DIAGNOSIS — S01309A Unspecified open wound of unspecified ear, initial encounter: Secondary | ICD-10-CM | POA: Diagnosis not present

## 2014-06-16 DIAGNOSIS — R072 Precordial pain: Secondary | ICD-10-CM | POA: Diagnosis not present

## 2014-06-16 DIAGNOSIS — S199XXA Unspecified injury of neck, initial encounter: Secondary | ICD-10-CM | POA: Diagnosis present

## 2014-06-16 DIAGNOSIS — Z87448 Personal history of other diseases of urinary system: Secondary | ICD-10-CM | POA: Diagnosis not present

## 2014-06-16 DIAGNOSIS — W19XXXA Unspecified fall, initial encounter: Secondary | ICD-10-CM

## 2014-06-16 MED ORDER — ACETAMINOPHEN 325 MG PO TABS
650.0000 mg | ORAL_TABLET | Freq: Once | ORAL | Status: DC
  Filled 2014-06-16: qty 2

## 2014-06-16 MED ORDER — SODIUM CHLORIDE 0.9 % IV SOLN: Freq: Once | INTRAVENOUS | Status: DC

## 2014-06-16 MED ORDER — MORPHINE SULFATE 2 MG/ML IJ SOLN: 2.0000 mg | Freq: Once | INTRAMUSCULAR | Status: DC

## 2014-06-16 NOTE — Discharge Instructions (Signed)
Wear your protective collar until cleared by neurosurgery. Return for weakness or numbness in your arms and legs. If you were given medicines take as directed.  If you are on coumadin or contraceptives realize their levels and effectiveness is altered by many different medicines.  If you have any reaction (rash, tongues swelling, other) to the medicines stop taking and see a physician.   Please follow up as directed and return to the ER or see a physician for new or worsening symptoms.  Thank you. Filed Vitals:   06/16/14 1543 06/16/14 1708  BP: 136/79 168/76  Pulse: 64 62  Temp: 97.6 F (36.4 C)   TempSrc: Oral   Resp: 20 18  Height: 5\' 8"  (1.727 m)   Weight: 165 lb (74.844 kg)   SpO2: 95% 100%    Cervical Spine Fracture, Stable A cervical spine fracture is a break or crack in one of the bones of the neck. A fracture is stable if the chances of it causing you problems while it is healing are very small. CAUSES   Vehicle accidents.  Injuries from sports such as diving, football, biking, wrestling, or skiing.  Occasionally, severe osteoporosis or other bone diseases, such as cancers that spread to bone or metabolic abnormalities. SYMPTOMS   Severe neck pain after an accident or fall.  Pain down your shoulders or arms.  Bruising or swelling on the back of your neck.  Numbness, tingling, muscle spasm, or weakness. DIAGNOSIS  Cervical spine fracture is diagnosed with the help of X-ray exams of your neck. Often a CT scan or MRI is used to confirm the diagnosis and help determine how your injury should be treated. Generally, an examination of your neck, arms, and legs, and the history of your injury prompts the health care provider to order these tests.  TREATMENT  A stable fracture needs to be treated with a brace or cervical collar. A cervical collar is a two-piece collar designed to keep your neck from moving during the healing process. HOME CARE INSTRUCTIONS  Limit physical  activity to prevent worsening of the fracture.  Apply ice to areas of pain 3-4 times a day for 2 days.  Put ice in a bag.  Place a towel between your skin and the bag.  Leave the ice on for 15-20 minutes, 3-4 times a day.  You may have been given a cervical collar to wear.  Do not remove the collar unless instructed by your health care provider.  If you have long hair, keep it outside of the collar.  Ask your health care provider before making any adjustments to your collar. Minor adjustments may be required over time to improve comfort and reduce pressure on your chin or on the back of your head.  Keep your collar clean by wiping it with mild soap and water and drying it completely. The pads can be hand washed with soap and water and air dried completely.  If you are allowed to remove the collar for cleaning or bathing, follow your health care provider's instructions on how to do so safely.  If you are allowed to remove the collar for cleaning and bathing, wash and dry the skin of your neck. Check your skin for irritation or sores. If you see any, tell your health care provider.  Only take over-the-counter or prescription medicines for pain, discomfort, or fever as directed by your health care provider.   Keep all follow-up appointments as directed by your health care provider. Not keeping  an appointment could result in a chronic or permanent injury, pain, and disability. Additionally, X-rays or an MRI may be repeated 1-3 weeks after your initial appointment. This is to:  Make sure any other breaks or cracks were not missed.   Help identify stretched or torn ligaments.   Get your test results if you did not get them when you were first evaluated. The results will determine whether you need other tests or treatment. It is your responsibility to get the results. SEEK MEDICAL CARE IF: You have irritation or sores on your skin from the cervical collar. SEEK IMMEDIATE MEDICAL CARE  IF:   You have increasing pain in your neck.   You develop difficulties swallowing or breathing.  You develop swelling in your neck.   You have numbness, weakness, burning pain, or movement problems in the arms or legs.   You are unable to control your bowel or bladder (incontinence).   You have problems with coordination or difficulty walking. MAKE SURE YOU:   Understand these instructions.  Will watch your condition.  Will get help right away if you are not doing well or get worse. Document Released: 05/31/2004 Document Revised: 07/19/2013 Document Reviewed: 02/07/2013 [facility] Patient Information 2015 [place], [place]. This information is not intended to replace advice given to you by your health care provider. Make sure you discuss any questions you have with your health care provider.

## 2014-06-16 NOTE — ED Notes (Addendum)
Per pt sts he fell a few hours ago and hit head. sts he is on blood thinners. Denies any nausea, V, or vision changes. sts head pain, neck pain and right rib pain. No LOC

## 2014-06-16 NOTE — ED Provider Notes (Signed)
CSN: [id number]     Arrival date & time 06/16/14  1529 History   First MD Initiated Contact with Patient 06/16/14 1640     Chief Complaint  Patient presents with  . Fall     (Consider location/radiation/quality/duration/timing/severity/associated sxs/prior Treatment) HPI Comments: 80 year old male with history of atrial fibrillation, on Xarelto, takes aspirin, congestive heart failure, high blood pressure, past smoker presents after a fall at home. Patient was in the kitchen and his foot got stuck causing him to fall on his right upper back and flank with mild head injury. No loss of consciousness. Patient is mild pain in the right ribs with movement, no neuro concerns. This happened 3 hours ago.  Patient is a 80 y.o. male presenting with fall. The history is provided by the patient.  Fall Pertinent negatives include no chest pain, no abdominal pain, no headaches and no shortness of breath.    Past Medical History  Diagnosis Date  . CHF (congestive heart failure)   . Atrial fibrillation   . HTN (hypertension)   . Arthritis     incl spinal stenosis  . Renal insufficiency     question degree  . Wears partial dentures     upper partial  . Wears glasses     reading   Past Surgical History  Procedure Laterality Date  . Bilateral shoulder surgery    . Tonsillectomy    . Prosthetic hip - bilaterally    . R knee quadricep tendon repair    . I&d extremity  12/07/2011    Procedure: IRRIGATION AND DEBRIDEMENT EXTREMITY;  Surgeon: Tennis Must, MD;  Location: [facility];  Service: Orthopedics;  Laterality: Left;  Marland Kitchen Eye surgery      both catararcts  . Trigger finger release Right 05/26/2013    Procedure: RIGHT SMALL TRIGGER RELEASE ;  Surgeon: Tennis Must, MD;  Location: [facility];  Service: Orthopedics;  Laterality: Right;  . Pacemaker insertion      BSX pacemaker generator change 11-28-2013 by Dr Caryl Comes   History reviewed. No pertinent family history. History   Substance Use Topics  . Smoking status: Former Smoker    Types: Cigarettes    Quit date: 05/13/1971  . Smokeless tobacco: Never Used  . Alcohol Use: Yes     Comment: occasional    Review of Systems  Constitutional: Negative for fever and chills.  HENT: Negative for congestion.   Eyes: Negative for visual disturbance.  Respiratory: Negative for shortness of breath.   Cardiovascular: Negative for chest pain.  Gastrointestinal: Negative for vomiting and abdominal pain.  Genitourinary: Positive for flank pain. Negative for dysuria.  Musculoskeletal: Positive for neck pain. Negative for back pain and neck stiffness.  Skin: Negative for rash.  Neurological: Negative for weakness, light-headedness, numbness and headaches.      Allergies  Amiodarone  Home Medications   Prior to Admission medications   Medication Sig Start Date End Date Taking? Authorizing Provider  acetaminophen (TYLENOL) 500 MG tablet Take 1,000 mg by mouth 2 (two) times daily. scheduled   Yes Historical Provider, MD  amoxicillin (AMOXIL) 500 MG capsule Take 500 mg by mouth See admin instructions. Take 4 capsules (2000 mg) by mouth prior to dental appointment   Yes Historical Provider, MD  Ascorbic Acid (VITAMIN C) 1000 MG tablet Take 1,000 mg by mouth daily.   Yes Historical Provider, MD  b complex vitamins tablet Take 1 tablet by mouth daily.   Yes Historical Provider, MD  beta carotene w/minerals (OCUVITE) tablet Take 1 tablet by mouth 2 (two) times daily.   Yes Historical Provider, MD  CALCIUM CITRATE PO Take 630 mg by mouth daily.   Yes Historical Provider, MD  carvedilol (COREG) 3.125 MG tablet Take 3.125 mg by mouth 2 (two) times daily as needed (high blood pressure). If blood pressure under 100 do not take any of this medication, between 100 and 140 take 1 tablet (3.125 mg), over 140 take 2 tablets (6.25 mg) 02/11/11  Yes Deboraha Sprang, MD  Cholecalciferol (VITAMIN D-3) 5000 UNITS TABS Take 5,000 Units by  mouth daily.   Yes Historical Provider, MD  finasteride (PROSCAR) 5 MG tablet Take 5 mg by mouth daily.    Yes Historical Provider, MD  furosemide (LASIX) 20 MG tablet Take 20 mg by mouth daily.   Yes Historical Provider, MD  Mouthwashes (BIOTENE DRY MOUTH MT) Use as directed 10 mLs in the mouth or throat daily as needed (dry mouth).   Yes Historical Provider, MD  Multiple Vitamin (MULITIVITAMIN WITH MINERALS) TABS Take 1 tablet by mouth daily.    Yes Historical Provider, MD  Omega-3 Fatty Acids (FISH OIL) 1200 MG CAPS Take 2,400 mg by mouth 2 (two) times daily.   Yes Historical Provider, MD  polyvinyl alcohol (LIQUIFILM TEARS) 1.4 % ophthalmic solution Place 1 drop into both eyes 3 (three) times daily.   Yes Historical Provider, MD  pravastatin (PRAVACHOL) 40 MG tablet Take 40 mg by mouth at bedtime.   Yes Historical Provider, MD  rivaroxaban (XARELTO) 20 MG TABS tablet Take 20 mg by mouth daily with supper.   Yes Historical Provider, MD  Sodium Fluoride (FLUORIDEX DAILY DEFENSE DT) Place 1 application onto teeth daily.   Yes Historical Provider, MD  Tamsulosin HCl (FLOMAX) 0.4 MG CAPS Take 0.4 mg by mouth 2 (two) times daily.    Yes Historical Provider, MD  traMADol (ULTRAM) 50 MG tablet Take 100 mg by mouth 2 (two) times daily. scheduled   Yes Historical Provider, MD  zolpidem (AMBIEN) 10 MG tablet Take 10 mg by mouth at bedtime as needed for sleep.    Yes Historical Provider, MD   BP 168/76 mmHg  Pulse 62  Temp(Src) 97.6 F (36.4 C) (Oral)  Resp 18  Ht 5\' 8"  (1.727 m)  Wt 165 lb (74.844 kg)  BMI 25.09 kg/m2  SpO2 100% Physical Exam  Constitutional: He is oriented to person, place, and time. He appears well-developed and well-nourished.  HENT:  Head: Normocephalic and atraumatic.  Eyes: Conjunctivae are normal. Right eye exhibits no discharge. Left eye exhibits no discharge.  Neck: Normal range of motion. Neck supple. No tracheal deviation present.  Cardiovascular: Normal rate and  regular rhythm.   Pulmonary/Chest: Effort normal and breath sounds normal.  Abdominal: Soft. He exhibits no distension. There is no tenderness. There is no guarding.  Musculoskeletal: He exhibits no edema.  Mild tenderness lower cervical midline Patient has mild tenderness right mid posterior rib no step-off. No tenderness to the hips or knees bilateral flexion extension and external rotation of the hips.  Neurological: He is alert and oriented to person, place, and time. GCS eye subscore is 4. GCS verbal subscore is 5. GCS motor subscore is 6.  Patient has 5+ strength upper and lower extremities equal bilateral at the major joints with flexion extension, mild decreased strength in the shoulders due to chronic shoulder pain and shoulder surgeries. Sensation to palpation intact bilateral.  Skin: Skin is warm. No  rash noted.  Psychiatric: He has a normal mood and affect.  Nursing note and vitals reviewed.   ED Course  Procedures (including critical care time) Labs Review Labs Reviewed - No data to display  Imaging Review Dg Ribs Unilateral W/chest Right  06/16/2014   CLINICAL DATA:  Fall.  Lower posterior right rib pain.  EXAM: RIGHT RIBS AND CHEST - 3+ VIEW  COMPARISON:  Chest x-ray 12/11/2011  FINDINGS: Left pacer in place, unchanged. Heart is upper limits normal in size. No confluent airspace opacities or effusions. Areas of scarring in the apices and lung bases. No effusions or pneumothorax. No visible rib fracture.  IMPRESSION: No active cardiopulmonary disease.   Electronically Signed   By: Rolm Baptise M.D.   On: 06/16/2014 17:04   Ct Head Wo Contrast  06/16/2014   CLINICAL DATA:  Fall and hit head pain. Complains of neck pain and right-sided rib pain.  EXAM: CT HEAD WITHOUT CONTRAST  CT CERVICAL SPINE WITHOUT CONTRAST  TECHNIQUE: Multidetector CT imaging of the head and cervical spine was performed following the standard protocol without intravenous contrast. Multiplanar CT image  reconstructions of the cervical spine were also generated.  COMPARISON:  Head CT 12/03/2011 and cervical spine 10/13/2011  FINDINGS: CT HEAD FINDINGS  Stable cerebral atrophy. No evidence for acute hemorrhage, mass lesion, midline shift, hydrocephalus or large infarct. Stable low-density in the right cerebral peduncle. This may represent an old infarct. Visualized paranasal sinuses are clear. No acute bone abnormality.  CT CERVICAL SPINE FINDINGS  There is a minimally displaced fracture involving the left anterior arch of C1. The anterior arch of C1 and odontoid are in very close proximity to each other and difficult to exclude some ankylosis. There is a minimally displaced fracture involving the left C2 lateral mass. Fracture extends to the articulation of C1-C2. Fracture does not involve the odontoid process. Negative for an apical pneumothorax. Severe multilevel degenerative facet disease. There is ankylosis at the facets of C4-C5. Vertebral body heights are maintained. Disc space disease at C6-C7 and C7-T1. No significant swelling in the neck.  IMPRESSION: Fractures involving the C1 anterior arch and the C2 left lateral mass.  No acute intracranial abnormality.  These results were called by telephone at the time of interpretation on 06/16/2014 at 5:19 pm to Dr. Tanna Furry , who verbally acknowledged these results.   Electronically Signed   By: Markus Daft M.D.   On: 06/16/2014 17:20   Ct Cervical Spine Wo Contrast  06/16/2014   CLINICAL DATA:  Fall and hit head pain. Complains of neck pain and right-sided rib pain.  EXAM: CT HEAD WITHOUT CONTRAST  CT CERVICAL SPINE WITHOUT CONTRAST  TECHNIQUE: Multidetector CT imaging of the head and cervical spine was performed following the standard protocol without intravenous contrast. Multiplanar CT image reconstructions of the cervical spine were also generated.  COMPARISON:  Head CT 12/03/2011 and cervical spine 10/13/2011  FINDINGS: CT HEAD FINDINGS  Stable cerebral  atrophy. No evidence for acute hemorrhage, mass lesion, midline shift, hydrocephalus or large infarct. Stable low-density in the right cerebral peduncle. This may represent an old infarct. Visualized paranasal sinuses are clear. No acute bone abnormality.  CT CERVICAL SPINE FINDINGS  There is a minimally displaced fracture involving the left anterior arch of C1. The anterior arch of C1 and odontoid are in very close proximity to each other and difficult to exclude some ankylosis. There is a minimally displaced fracture involving the left C2 lateral mass. Fracture extends to  the articulation of C1-C2. Fracture does not involve the odontoid process. Negative for an apical pneumothorax. Severe multilevel degenerative facet disease. There is ankylosis at the facets of C4-C5. Vertebral body heights are maintained. Disc space disease at C6-C7 and C7-T1. No significant swelling in the neck.  IMPRESSION: Fractures involving the C1 anterior arch and the C2 left lateral mass.  No acute intracranial abnormality.  These results were called by telephone at the time of interpretation on 06/16/2014 at 5:19 pm to Dr. Tanna Furry , who verbally acknowledged these results.   Electronically Signed   By: Markus Daft M.D.   On: 06/16/2014 17:20     EKG Interpretation   Date/Time:  Friday June 16 2014 17:48:53 EST Ventricular Rate:  60 PR Interval:  96 QRS Duration: 166 QT Interval:  506 QTC Calculation: 506 R Axis:   122 Text Interpretation:  Sinus rhythm Ventricular tachycardia, unsustained  Short PR interval Consider left ventricular hypertrophy Repol abnrm,  global ischemia, diffuse leads Prolonged QT interval Confirmed by Reather Converse   MD, Dolce Sylvia ([id number]) on 06/16/2014 5:59:19 PM      MDM   Final diagnoses:  Cervical spine fracture, initial encounter  Fall, initial encounter   patient with mechanical fall, primary concern for bleeding in the brain since he is on blood thinner however patient does have focal neck  pain. Radiology called Dr. Jeneen Rinks reporting fracture of C1 anterior arch and lateral mass. Aspirin collar placed. Normal neuro exam currently. Neurosurgery paged and blood work ordered for possible preop.  Spoke with Dr. Arnoldo Morale neurosurgery who recommended aspirin collar and will follow-up in the clinic in a few weeks. CT head results reviewed no acute findings. X-ray reviewed no acute fracture.  Results and differential diagnosis were discussed with the patient/parent/guardian. Close follow up outpatient was discussed, comfortable with the plan.   Medications  morphine 2 MG/ML injection 2 mg (not administered)  0.9 %  sodium chloride infusion (not administered)    Filed Vitals:   06/16/14 1543 06/16/14 1708  BP: 136/79 168/76  Pulse: 64 62  Temp: 97.6 F (36.4 C)   TempSrc: Oral   Resp: 20 18  Height: 5\' 8"  (1.727 m)   Weight: 165 lb (74.844 kg)   SpO2: 95% 100%    Final diagnoses:  Cervical spine fracture, initial encounter  Fall, initial encounter      Mariea Clonts, MD 06/16/14 1759

## 2014-06-16 NOTE — ED Notes (Signed)
Pt discharged with C-collar in place and aware to follow up. Pressure dressing applied to rt ear per dr Angelina Pih request to protect abrasion against hematoma. Pt aware that dressing is to remain for 24 hours.

## 2014-06-18 ENCOUNTER — Encounter (HOSPITAL_COMMUNITY): Payer: Self-pay | Admitting: *Deleted

## 2014-06-18 ENCOUNTER — Emergency Department (HOSPITAL_COMMUNITY): Payer: Medicare Other

## 2014-06-18 ENCOUNTER — Emergency Department (HOSPITAL_COMMUNITY)
Admission: EM | Admit: 2014-06-18 | Discharge: 2014-06-18 | Disposition: A | Discharge status: Home / Self Care | Payer: Medicare Other | Attending: Emergency Medicine | Admitting: Emergency Medicine

## 2014-06-18 DIAGNOSIS — S12100D Unspecified displaced fracture of second cervical vertebra, subsequent encounter for fracture with routine healing: Secondary | ICD-10-CM | POA: Diagnosis not present

## 2014-06-18 DIAGNOSIS — M069 Rheumatoid arthritis, unspecified: Secondary | ICD-10-CM | POA: Insufficient documentation

## 2014-06-18 DIAGNOSIS — S129XXD Fracture of neck, unspecified, subsequent encounter: Secondary | ICD-10-CM | POA: Insufficient documentation

## 2014-06-18 DIAGNOSIS — S12030D Displaced posterior arch fracture of first cervical vertebra, subsequent encounter for fracture with routine healing: Secondary | ICD-10-CM | POA: Diagnosis not present

## 2014-06-18 DIAGNOSIS — S12000D Unspecified displaced fracture of first cervical vertebra, subsequent encounter for fracture with routine healing: Secondary | ICD-10-CM | POA: Diagnosis not present

## 2014-06-18 DIAGNOSIS — I509 Heart failure, unspecified: Secondary | ICD-10-CM | POA: Insufficient documentation

## 2014-06-18 DIAGNOSIS — W1839XD Other fall on same level, subsequent encounter: Secondary | ICD-10-CM | POA: Diagnosis not present

## 2014-06-18 DIAGNOSIS — Z87448 Personal history of other diseases of urinary system: Secondary | ICD-10-CM | POA: Diagnosis not present

## 2014-06-18 DIAGNOSIS — I4891 Unspecified atrial fibrillation: Secondary | ICD-10-CM | POA: Diagnosis not present

## 2014-06-18 DIAGNOSIS — Z79899 Other long term (current) drug therapy: Secondary | ICD-10-CM | POA: Insufficient documentation

## 2014-06-18 DIAGNOSIS — M542 Cervicalgia: Secondary | ICD-10-CM

## 2014-06-18 DIAGNOSIS — S199XXD Unspecified injury of neck, subsequent encounter: Secondary | ICD-10-CM | POA: Diagnosis present

## 2014-06-18 DIAGNOSIS — Z792 Long term (current) use of antibiotics: Secondary | ICD-10-CM | POA: Insufficient documentation

## 2014-06-18 DIAGNOSIS — Z87891 Personal history of nicotine dependence: Secondary | ICD-10-CM | POA: Insufficient documentation

## 2014-06-18 DIAGNOSIS — S12191D Other nondisplaced fracture of second cervical vertebra, subsequent encounter for fracture with routine healing: Secondary | ICD-10-CM | POA: Diagnosis not present

## 2014-06-18 DIAGNOSIS — Z98811 Dental restoration status: Secondary | ICD-10-CM | POA: Diagnosis not present

## 2014-06-18 DIAGNOSIS — I10 Essential (primary) hypertension: Secondary | ICD-10-CM | POA: Diagnosis not present

## 2014-06-18 MED ORDER — HYDROCODONE-ACETAMINOPHEN 5-325 MG PO TABS: 1.0000 | ORAL_TABLET | ORAL | Status: DC | PRN

## 2014-06-18 MED ORDER — CYCLOBENZAPRINE HCL 10 MG PO TABS: 10.0000 mg | ORAL_TABLET | Freq: Two times a day (BID) | ORAL | Status: DC | PRN

## 2014-06-18 MED ORDER — HYDROCODONE-ACETAMINOPHEN 5-325 MG PO TABS
1.0000 | ORAL_TABLET | Freq: Once | ORAL | Status: DC
  Filled 2014-06-18: qty 1

## 2014-06-18 MED ORDER — CYCLOBENZAPRINE HCL 10 MG PO TABS
10.0000 mg | ORAL_TABLET | Freq: Once | ORAL | Status: AC
  Administered 2014-06-18: 10 mg via ORAL
  Filled 2014-06-18: qty 1

## 2014-06-18 NOTE — ED Provider Notes (Signed)
CSN: [id number]     Arrival date & time 06/18/14  1551 History   First MD Initiated Contact with Patient 06/18/14 1608     Chief Complaint  Patient presents with  . Neck Pain     (Consider location/radiation/quality/duration/timing/severity/associated sxs/prior Treatment) HPI Comments: Patient is an 80 year old male with a past medical history of atrial fibrillation, CHF, and hypertension who presents with increased cervical neck pain since his fall 2 days ago. Patient had a mechanical fall 2 days ago where he had a resulting C1 and C2 fracture. Patient was discharged home with Aspen collar and instructed to follow up. Patient woke up this morning with exponentially worsening neck pain. He also reports neck pain with opening his mouth. He denies any new injury or any other associated symptoms. Patient denies numbness/weakness of extremities.    Past Medical History  Diagnosis Date  . CHF (congestive heart failure)   . Atrial fibrillation   . HTN (hypertension)   . Arthritis     incl spinal stenosis  . Renal insufficiency     question degree  . Wears partial dentures     upper partial  . Wears glasses     reading   Past Surgical History  Procedure Laterality Date  . Bilateral shoulder surgery    . Tonsillectomy    . Prosthetic hip - bilaterally    . R knee quadricep tendon repair    . I&d extremity  12/07/2011    Procedure: IRRIGATION AND DEBRIDEMENT EXTREMITY;  Surgeon: Tennis Must, MD;  Location: [facility];  Service: Orthopedics;  Laterality: Left;  Marland Kitchen Eye surgery      both catararcts  . Trigger finger release Right 05/26/2013    Procedure: RIGHT SMALL TRIGGER RELEASE ;  Surgeon: Tennis Must, MD;  Location: [facility];  Service: Orthopedics;  Laterality: Right;  . Pacemaker insertion      BSX pacemaker generator change 11-28-2013 by Dr Caryl Comes   No family history on file. History  Substance Use Topics  . Smoking status: Former Smoker    Types: Cigarettes     Quit date: 05/13/1971  . Smokeless tobacco: Never Used  . Alcohol Use: Yes     Comment: occasional    Review of Systems  Musculoskeletal: Positive for neck pain.  All other systems reviewed and are negative.     Allergies  Amiodarone  Home Medications   Prior to Admission medications   Medication Sig Start Date End Date Taking? Authorizing Provider  acetaminophen (TYLENOL) 500 MG tablet Take 1,000 mg by mouth 2 (two) times daily. scheduled    Historical Provider, MD  amoxicillin (AMOXIL) 500 MG capsule Take 500 mg by mouth See admin instructions. Take 4 capsules (2000 mg) by mouth prior to dental appointment    Historical Provider, MD  Ascorbic Acid (VITAMIN C) 1000 MG tablet Take 1,000 mg by mouth daily.    Historical Provider, MD  b complex vitamins tablet Take 1 tablet by mouth daily.    Historical Provider, MD  beta carotene w/minerals (OCUVITE) tablet Take 1 tablet by mouth 2 (two) times daily.    Historical Provider, MD  CALCIUM CITRATE PO Take 630 mg by mouth daily.    Historical Provider, MD  carvedilol (COREG) 3.125 MG tablet Take 3.125 mg by mouth 2 (two) times daily as needed (high blood pressure). If blood pressure under 100 do not take any of this medication, between 100 and 140 take 1 tablet (3.125 mg),  over 140 take 2 tablets (6.25 mg) 02/11/11   Deboraha Sprang, MD  Cholecalciferol (VITAMIN D-3) 5000 UNITS TABS Take 5,000 Units by mouth daily.    Historical Provider, MD  finasteride (PROSCAR) 5 MG tablet Take 5 mg by mouth daily.     Historical Provider, MD  furosemide (LASIX) 20 MG tablet Take 20 mg by mouth daily.    Historical Provider, MD  Mouthwashes (BIOTENE DRY MOUTH MT) Use as directed 10 mLs in the mouth or throat daily as needed (dry mouth).    Historical Provider, MD  Multiple Vitamin (MULITIVITAMIN WITH MINERALS) TABS Take 1 tablet by mouth daily.     Historical Provider, MD  Omega-3 Fatty Acids (FISH OIL) 1200 MG CAPS Take 2,400 mg by mouth 2 (two) times  daily.    Historical Provider, MD  polyvinyl alcohol (LIQUIFILM TEARS) 1.4 % ophthalmic solution Place 1 drop into both eyes 3 (three) times daily.    Historical Provider, MD  pravastatin (PRAVACHOL) 40 MG tablet Take 40 mg by mouth at bedtime.    Historical Provider, MD  rivaroxaban (XARELTO) 20 MG TABS tablet Take 20 mg by mouth daily with supper.    Historical Provider, MD  Sodium Fluoride (FLUORIDEX DAILY DEFENSE DT) Place 1 application onto teeth daily.    Historical Provider, MD  Tamsulosin HCl (FLOMAX) 0.4 MG CAPS Take 0.4 mg by mouth 2 (two) times daily.     Historical Provider, MD  traMADol (ULTRAM) 50 MG tablet Take 100 mg by mouth 2 (two) times daily. scheduled    Historical Provider, MD  zolpidem (AMBIEN) 10 MG tablet Take 10 mg by mouth at bedtime as needed for sleep.     Historical Provider, MD   BP 110/60 mmHg  Pulse 64  Temp(Src) 98 F (36.7 C) (Oral)  Resp 20  SpO2 96% Physical Exam  Constitutional: He is oriented to person, place, and time. He appears well-developed and well-nourished. No distress.  HENT:  Head: Normocephalic and atraumatic.  Eyes: Conjunctivae and EOM are normal.  Neck:  Aspen collar intact.   Cardiovascular: Normal rate and regular rhythm.  Exam reveals no gallop and no friction rub.   No murmur heard. Pulmonary/Chest: Effort normal and breath sounds normal. He has no wheezes. He has no rales. He exhibits no tenderness.  Abdominal: Soft. There is no tenderness.  Musculoskeletal: Normal range of motion.  Neurological: He is alert and oriented to person, place, and time. Coordination normal.  Extremity strength and sensation equal and intact bilaterally. Speech is goal-oriented. Moves limbs without ataxia.   Skin: Skin is warm and dry.  Psychiatric: He has a normal mood and affect. His behavior is normal.  Nursing note and vitals reviewed.   ED Course  Procedures (including critical care time) Labs Review Labs Reviewed - No data to  display  Imaging Review Dg Ribs Unilateral W/chest Right  06/16/2014   CLINICAL DATA:  Fall.  Lower posterior right rib pain.  EXAM: RIGHT RIBS AND CHEST - 3+ VIEW  COMPARISON:  Chest x-ray 12/11/2011  FINDINGS: Left pacer in place, unchanged. Heart is upper limits normal in size. No confluent airspace opacities or effusions. Areas of scarring in the apices and lung bases. No effusions or pneumothorax. No visible rib fracture.  IMPRESSION: No active cardiopulmonary disease.   Electronically Signed   By: Rolm Baptise M.D.   On: 06/16/2014 17:04   Ct Head Wo Contrast  06/16/2014   CLINICAL DATA:  Fall and hit head pain.  Complains of neck pain and right-sided rib pain.  EXAM: CT HEAD WITHOUT CONTRAST  CT CERVICAL SPINE WITHOUT CONTRAST  TECHNIQUE: Multidetector CT imaging of the head and cervical spine was performed following the standard protocol without intravenous contrast. Multiplanar CT image reconstructions of the cervical spine were also generated.  COMPARISON:  Head CT 12/03/2011 and cervical spine 10/13/2011  FINDINGS: CT HEAD FINDINGS  Stable cerebral atrophy. No evidence for acute hemorrhage, mass lesion, midline shift, hydrocephalus or large infarct. Stable low-density in the right cerebral peduncle. This may represent an old infarct. Visualized paranasal sinuses are clear. No acute bone abnormality.  CT CERVICAL SPINE FINDINGS  There is a minimally displaced fracture involving the left anterior arch of C1. The anterior arch of C1 and odontoid are in very close proximity to each other and difficult to exclude some ankylosis. There is a minimally displaced fracture involving the left C2 lateral mass. Fracture extends to the articulation of C1-C2. Fracture does not involve the odontoid process. Negative for an apical pneumothorax. Severe multilevel degenerative facet disease. There is ankylosis at the facets of C4-C5. Vertebral body heights are maintained. Disc space disease at C6-C7 and C7-T1. No  significant swelling in the neck.  IMPRESSION: Fractures involving the C1 anterior arch and the C2 left lateral mass.  No acute intracranial abnormality.  These results were called by telephone at the time of interpretation on 06/16/2014 at 5:19 pm to Dr. Tanna Furry , who verbally acknowledged these results.   Electronically Signed   By: Markus Daft M.D.   On: 06/16/2014 17:20   Ct Cervical Spine Wo Contrast  06/16/2014   CLINICAL DATA:  Fall and hit head pain. Complains of neck pain and right-sided rib pain.  EXAM: CT HEAD WITHOUT CONTRAST  CT CERVICAL SPINE WITHOUT CONTRAST  TECHNIQUE: Multidetector CT imaging of the head and cervical spine was performed following the standard protocol without intravenous contrast. Multiplanar CT image reconstructions of the cervical spine were also generated.  COMPARISON:  Head CT 12/03/2011 and cervical spine 10/13/2011  FINDINGS: CT HEAD FINDINGS  Stable cerebral atrophy. No evidence for acute hemorrhage, mass lesion, midline shift, hydrocephalus or large infarct. Stable low-density in the right cerebral peduncle. This may represent an old infarct. Visualized paranasal sinuses are clear. No acute bone abnormality.  CT CERVICAL SPINE FINDINGS  There is a minimally displaced fracture involving the left anterior arch of C1. The anterior arch of C1 and odontoid are in very close proximity to each other and difficult to exclude some ankylosis. There is a minimally displaced fracture involving the left C2 lateral mass. Fracture extends to the articulation of C1-C2. Fracture does not involve the odontoid process. Negative for an apical pneumothorax. Severe multilevel degenerative facet disease. There is ankylosis at the facets of C4-C5. Vertebral body heights are maintained. Disc space disease at C6-C7 and C7-T1. No significant swelling in the neck.  IMPRESSION: Fractures involving the C1 anterior arch and the C2 left lateral mass.  No acute intracranial abnormality.  These results  were called by telephone at the time of interpretation on 06/16/2014 at 5:19 pm to Dr. Tanna Furry , who verbally acknowledged these results.   Electronically Signed   By: Markus Daft M.D.   On: 06/16/2014 17:20     EKG Interpretation None      MDM   Final diagnoses:  Neck pain  Cervical spine fracture, subsequent encounter    4:33 PM CT cervical spine pending. No neuro deficits. Vitals stable and  patient afebrile. No new injury.   5:30 PM Ct cervical spine unchanged from previous. Patient will be discharged without further evaluation.   [name], [name] 06/18/14 1808  Evelina Bucy, MD 06/18/14 2258

## 2014-06-18 NOTE — Discharge Instructions (Signed)
Take Vicodin as needed for pain. Take Flexeril as needed for muscle spasm. You may take these medications together. Follow up with the recommended doctor from your last visit.

## 2014-06-18 NOTE — ED Notes (Signed)
The pt is c/o neck pain since he fell 2 days ago and was diagnosed with c-spine fracs.  He is in an aspen collar.  He woke up tis am with lt sided neck pain and he was concerned.  No numbness in his shoulders.  No difficulty ambulating

## 2014-06-18 NOTE — ED Notes (Signed)
Pt fell and hurt his neck and states he was diagnosed with neck fractures and sent home without pain medicine two days ago, today woke up with "severe pain" with no relief from tramadol and acetaminophen that he normally takes for arthritis.

## 2014-07-06 ENCOUNTER — Encounter (HOSPITAL_COMMUNITY): Payer: Self-pay | Admitting: Internal Medicine

## 2014-07-13 ENCOUNTER — Ambulatory Visit (INDEPENDENT_AMBULATORY_CARE_PROVIDER_SITE_OTHER): Payer: Medicare Other | Admitting: *Deleted

## 2014-07-13 DIAGNOSIS — I495 Sick sinus syndrome: Secondary | ICD-10-CM

## 2014-07-13 LAB — MDC_IDC_ENUM_SESS_TYPE_REMOTE
Battery Remaining Longevity: 126 mo
Battery Remaining Percentage: 100 %
Brady Statistic RV Percent Paced: 65 %
Date Time Interrogation Session: 20151217054300
Implantable Pulse Generator Serial Number: 390662
Lead Channel Impedance Value: 457 Ohm
Lead Channel Pacing Threshold Amplitude: 0.9 V
Lead Channel Pacing Threshold Pulse Width: 0.4 ms
Lead Channel Setting Pacing Amplitude: 1.3 V
Lead Channel Setting Pacing Pulse Width: 0.4 ms
Lead Channel Setting Sensing Sensitivity: 2.5 mV
Zone Setting Detection Interval: 375 ms

## 2014-07-13 NOTE — Progress Notes (Signed)
Remote pacemaker transmission.   

## 2014-07-25 DIAGNOSIS — M542 Cervicalgia: Secondary | ICD-10-CM | POA: Diagnosis not present

## 2014-07-25 DIAGNOSIS — I10 Essential (primary) hypertension: Secondary | ICD-10-CM | POA: Diagnosis not present

## 2014-07-25 DIAGNOSIS — S12001A Unspecified nondisplaced fracture of first cervical vertebra, initial encounter for closed fracture: Secondary | ICD-10-CM | POA: Diagnosis not present

## 2014-08-08 DIAGNOSIS — M5136 Other intervertebral disc degeneration, lumbar region: Secondary | ICD-10-CM | POA: Diagnosis not present

## 2014-08-08 DIAGNOSIS — G894 Chronic pain syndrome: Secondary | ICD-10-CM | POA: Diagnosis not present

## 2014-08-08 DIAGNOSIS — Z79891 Long term (current) use of opiate analgesic: Secondary | ICD-10-CM | POA: Diagnosis not present

## 2014-08-25 ENCOUNTER — Encounter: Payer: Self-pay | Admitting: Internal Medicine

## 2014-09-05 DIAGNOSIS — Z6826 Body mass index (BMI) 26.0-26.9, adult: Secondary | ICD-10-CM | POA: Diagnosis not present

## 2014-09-05 DIAGNOSIS — I10 Essential (primary) hypertension: Secondary | ICD-10-CM | POA: Diagnosis not present

## 2014-09-05 DIAGNOSIS — S12001A Unspecified nondisplaced fracture of first cervical vertebra, initial encounter for closed fracture: Secondary | ICD-10-CM | POA: Diagnosis not present

## 2014-09-21 DIAGNOSIS — R7309 Other abnormal glucose: Secondary | ICD-10-CM | POA: Diagnosis not present

## 2014-09-21 DIAGNOSIS — I495 Sick sinus syndrome: Secondary | ICD-10-CM | POA: Diagnosis not present

## 2014-09-21 DIAGNOSIS — I638 Other cerebral infarction: Secondary | ICD-10-CM | POA: Diagnosis not present

## 2014-09-21 DIAGNOSIS — N189 Chronic kidney disease, unspecified: Secondary | ICD-10-CM | POA: Diagnosis not present

## 2014-09-21 DIAGNOSIS — I10 Essential (primary) hypertension: Secondary | ICD-10-CM | POA: Diagnosis not present

## 2014-09-21 DIAGNOSIS — I482 Chronic atrial fibrillation: Secondary | ICD-10-CM | POA: Diagnosis not present

## 2014-09-21 DIAGNOSIS — I5020 Unspecified systolic (congestive) heart failure: Secondary | ICD-10-CM | POA: Diagnosis not present

## 2014-09-21 DIAGNOSIS — E785 Hyperlipidemia, unspecified: Secondary | ICD-10-CM | POA: Diagnosis not present

## 2014-09-21 DIAGNOSIS — I2510 Atherosclerotic heart disease of native coronary artery without angina pectoris: Secondary | ICD-10-CM | POA: Diagnosis not present

## 2014-10-04 DIAGNOSIS — I2510 Atherosclerotic heart disease of native coronary artery without angina pectoris: Secondary | ICD-10-CM | POA: Diagnosis not present

## 2014-10-04 DIAGNOSIS — I10 Essential (primary) hypertension: Secondary | ICD-10-CM | POA: Diagnosis not present

## 2014-10-16 ENCOUNTER — Ambulatory Visit (INDEPENDENT_AMBULATORY_CARE_PROVIDER_SITE_OTHER): Payer: Medicare Other | Admitting: *Deleted

## 2014-10-16 DIAGNOSIS — I495 Sick sinus syndrome: Secondary | ICD-10-CM | POA: Diagnosis not present

## 2014-10-16 LAB — MDC_IDC_ENUM_SESS_TYPE_REMOTE
Battery Remaining Longevity: 120 mo
Battery Remaining Percentage: 100 %
Brady Statistic RV Percent Paced: 60 %
Date Time Interrogation Session: 20160321050700
Implantable Pulse Generator Serial Number: 390662
Lead Channel Impedance Value: 445 Ohm
Lead Channel Pacing Threshold Amplitude: 0.8 V
Lead Channel Pacing Threshold Pulse Width: 0.4 ms
Lead Channel Setting Pacing Amplitude: 1.3 V
Lead Channel Setting Pacing Pulse Width: 0.4 ms
Lead Channel Setting Sensing Sensitivity: 2.5 mV
Zone Setting Detection Interval: 375 ms

## 2014-10-16 NOTE — Progress Notes (Signed)
Remote pacemaker transmission.   

## 2014-10-17 ENCOUNTER — Encounter: Payer: Medicare Other | Admitting: Cardiology

## 2014-11-01 ENCOUNTER — Encounter: Payer: Self-pay | Admitting: Cardiology

## 2014-11-07 DIAGNOSIS — Z79891 Long term (current) use of opiate analgesic: Secondary | ICD-10-CM | POA: Diagnosis not present

## 2014-11-07 DIAGNOSIS — M5136 Other intervertebral disc degeneration, lumbar region: Secondary | ICD-10-CM | POA: Diagnosis not present

## 2014-11-07 DIAGNOSIS — G894 Chronic pain syndrome: Secondary | ICD-10-CM | POA: Diagnosis not present

## 2014-11-08 ENCOUNTER — Encounter: Payer: Self-pay | Admitting: Internal Medicine

## 2014-12-05 DIAGNOSIS — G894 Chronic pain syndrome: Secondary | ICD-10-CM | POA: Diagnosis not present

## 2014-12-05 DIAGNOSIS — M5136 Other intervertebral disc degeneration, lumbar region: Secondary | ICD-10-CM | POA: Diagnosis not present

## 2014-12-21 DIAGNOSIS — I5020 Unspecified systolic (congestive) heart failure: Secondary | ICD-10-CM | POA: Diagnosis not present

## 2014-12-21 DIAGNOSIS — I2510 Atherosclerotic heart disease of native coronary artery without angina pectoris: Secondary | ICD-10-CM | POA: Diagnosis not present

## 2014-12-21 DIAGNOSIS — M4800 Spinal stenosis, site unspecified: Secondary | ICD-10-CM | POA: Diagnosis not present

## 2014-12-21 DIAGNOSIS — I495 Sick sinus syndrome: Secondary | ICD-10-CM | POA: Diagnosis not present

## 2014-12-21 DIAGNOSIS — I638 Other cerebral infarction: Secondary | ICD-10-CM | POA: Diagnosis not present

## 2014-12-21 DIAGNOSIS — N189 Chronic kidney disease, unspecified: Secondary | ICD-10-CM | POA: Diagnosis not present

## 2014-12-21 DIAGNOSIS — R7309 Other abnormal glucose: Secondary | ICD-10-CM | POA: Diagnosis not present

## 2014-12-21 DIAGNOSIS — I482 Chronic atrial fibrillation: Secondary | ICD-10-CM | POA: Diagnosis not present

## 2014-12-21 DIAGNOSIS — E785 Hyperlipidemia, unspecified: Secondary | ICD-10-CM | POA: Diagnosis not present

## 2014-12-21 DIAGNOSIS — I10 Essential (primary) hypertension: Secondary | ICD-10-CM | POA: Diagnosis not present

## 2014-12-27 DIAGNOSIS — M545 Low back pain: Secondary | ICD-10-CM | POA: Diagnosis not present

## 2015-01-12 ENCOUNTER — Telehealth: Payer: Self-pay | Admitting: Internal Medicine

## 2015-01-12 NOTE — Telephone Encounter (Signed)
Patient stated that he was concerned about his upcoming remote bc he was unsure about how to use his new transmitter. He stated that it was plugged in on his bedside table. I informed him that the transmissions were automatic and it would send early in the am while he's sleeping. I also informed him that we would call him if it was not received. Patient voiced understanding.

## 2015-01-12 NOTE — Telephone Encounter (Signed)
New Prob    Pt has some questions and concerns regarding his next upcoming remote check. Please call.

## 2015-01-17 ENCOUNTER — Encounter: Payer: Self-pay | Admitting: Internal Medicine

## 2015-01-17 ENCOUNTER — Ambulatory Visit (INDEPENDENT_AMBULATORY_CARE_PROVIDER_SITE_OTHER): Payer: Medicare Other | Admitting: *Deleted

## 2015-01-17 DIAGNOSIS — I495 Sick sinus syndrome: Secondary | ICD-10-CM | POA: Diagnosis not present

## 2015-01-17 NOTE — Progress Notes (Signed)
Remote pacemaker transmission.   

## 2015-01-24 DIAGNOSIS — R7309 Other abnormal glucose: Secondary | ICD-10-CM | POA: Diagnosis not present

## 2015-01-24 DIAGNOSIS — I10 Essential (primary) hypertension: Secondary | ICD-10-CM | POA: Diagnosis not present

## 2015-02-02 LAB — CUP PACEART REMOTE DEVICE CHECK
Battery Remaining Longevity: 120 mo
Battery Remaining Percentage: 100 %
Brady Statistic RV Percent Paced: 64 %
Date Time Interrogation Session: 20160622044100
Lead Channel Impedance Value: 454 Ohm
Lead Channel Pacing Threshold Amplitude: 0.8 V
Lead Channel Pacing Threshold Pulse Width: 0.4 ms
Lead Channel Setting Pacing Amplitude: 1.3 V
Lead Channel Setting Pacing Pulse Width: 0.4 ms
Lead Channel Setting Sensing Sensitivity: 2.5 mV
Pulse Gen Serial Number: 390662
Zone Setting Detection Interval: 375 ms

## 2015-03-02 ENCOUNTER — Encounter: Payer: Self-pay | Admitting: Cardiology

## 2015-03-22 DIAGNOSIS — I10 Essential (primary) hypertension: Secondary | ICD-10-CM | POA: Diagnosis not present

## 2015-03-22 DIAGNOSIS — I5020 Unspecified systolic (congestive) heart failure: Secondary | ICD-10-CM | POA: Diagnosis not present

## 2015-03-22 DIAGNOSIS — E785 Hyperlipidemia, unspecified: Secondary | ICD-10-CM | POA: Diagnosis not present

## 2015-03-22 DIAGNOSIS — I2510 Atherosclerotic heart disease of native coronary artery without angina pectoris: Secondary | ICD-10-CM | POA: Diagnosis not present

## 2015-03-22 DIAGNOSIS — I495 Sick sinus syndrome: Secondary | ICD-10-CM | POA: Diagnosis not present

## 2015-03-22 DIAGNOSIS — I482 Chronic atrial fibrillation: Secondary | ICD-10-CM | POA: Diagnosis not present

## 2015-03-22 DIAGNOSIS — R7309 Other abnormal glucose: Secondary | ICD-10-CM | POA: Diagnosis not present

## 2015-03-22 DIAGNOSIS — M4808 Spinal stenosis, sacral and sacrococcygeal region: Secondary | ICD-10-CM | POA: Diagnosis not present

## 2015-03-22 DIAGNOSIS — M1990 Unspecified osteoarthritis, unspecified site: Secondary | ICD-10-CM | POA: Diagnosis not present

## 2015-03-22 DIAGNOSIS — N189 Chronic kidney disease, unspecified: Secondary | ICD-10-CM | POA: Diagnosis not present

## 2015-03-22 DIAGNOSIS — I638 Other cerebral infarction: Secondary | ICD-10-CM | POA: Diagnosis not present

## 2015-05-01 ENCOUNTER — Encounter: Payer: Self-pay | Admitting: *Deleted

## 2015-05-29 ENCOUNTER — Encounter: Payer: Medicare Other | Admitting: Physician Assistant

## 2015-06-25 ENCOUNTER — Encounter: Payer: Self-pay | Admitting: Nurse Practitioner

## 2015-06-25 ENCOUNTER — Ambulatory Visit (INDEPENDENT_AMBULATORY_CARE_PROVIDER_SITE_OTHER): Payer: Medicare Other | Admitting: Nurse Practitioner

## 2015-06-25 VITALS — BP 140/60 | HR 75 | Ht 68.0 in | Wt 172.2 lb

## 2015-06-25 DIAGNOSIS — I4821 Permanent atrial fibrillation: Secondary | ICD-10-CM

## 2015-06-25 DIAGNOSIS — I495 Sick sinus syndrome: Secondary | ICD-10-CM | POA: Diagnosis not present

## 2015-06-25 DIAGNOSIS — I482 Chronic atrial fibrillation: Secondary | ICD-10-CM | POA: Diagnosis not present

## 2015-06-25 NOTE — Patient Instructions (Addendum)
Medication Instructions:   CONTINUE SAME MEDICATIONS    If you need a refill on your cardiac medications before your next appointment, please call your pharmacy.  Labwork:  NONE ORDER TODAY    Testing/Procedures:  NONE ORDER TODAY    Follow-Up:  Remote monitoring is used to monitor your Pacemaker of ICD from home. This monitoring reduces the number of office visits required to check your device to one time per year. It allows [place] to keep an eye on the functioning of your device to ensure it is working properly. You are scheduled for a device check from home on .2 /28/17        You may send your transmission at any time that day. If you have a wireless device, the transmission will be sent automatically. After your physician reviews your transmission, you will receive a postcard with your next transmission date.  Your physician wants you to follow-up in: [place] will receive a reminder letter in the mail two months in advance. If you don't receive a letter, please call our office to schedule the follow-up appointment.   Any Other Special Instructions Will Be Listed Below (If Applicable).

## 2015-06-25 NOTE — Progress Notes (Signed)
Electrophysiology Office Note Date: 06/25/2015  ID:  [name], DOB 11-11-1926, MRN [id number]  PCP: Charolette Forward, MD Electrophysiologist: Caryl Comes  CC: Pacemaker follow-up  [name] is a 81 y.o. male seen today for Dr Caryl Comes.  He presents today for routine electrophysiology followup.  Since last being seen in our clinic, the patient reports doing very well.  He denies chest pain, palpitations, dyspnea, PND, orthopnea, nausea, vomiting, dizziness, syncope, edema, weight gain, or early satiety.  Device History: BSX single chamber PPM implanted 2000 for tachy/brady syndrome; gen change 2006; gen change 2015 (2 RV leads, no op notes available on when second added)    Past Medical History  Diagnosis Date  . CHF (congestive heart failure) ([place])   . Atrial fibrillation ([place])   . HTN (hypertension)   . Arthritis     incl spinal stenosis  . Renal insufficiency     question degree  . Wears partial dentures     upper partial  . Wears glasses     reading   Past Surgical History  Procedure Laterality Date  . Bilateral shoulder surgery    . Tonsillectomy    . Prosthetic hip - bilaterally    . R knee quadricep tendon repair    . I&d extremity  12/07/2011    Procedure: IRRIGATION AND DEBRIDEMENT EXTREMITY;  Surgeon: Tennis Must, MD;  Location: [facility];  Service: Orthopedics;  Laterality: Left;  Marland Kitchen Eye surgery      both catararcts  . Trigger finger release Right 05/26/2013    Procedure: RIGHT SMALL TRIGGER RELEASE ;  Surgeon: Tennis Must, MD;  Location: [facility];  Service: Orthopedics;  Laterality: Right;  . Pacemaker insertion      BSX pacemaker generator change 11-28-2013 by Dr Caryl Comes  . Pacemaker generator change N/A 11/28/2013    Procedure: PACEMAKER GENERATOR CHANGE;  Surgeon: Deboraha Sprang, MD;  Location: [facility] CATH LAB;  Service: Cardiovascular;  Laterality: N/A;    Current Outpatient Prescriptions  Medication Sig Dispense Refill  . acetaminophen (TYLENOL)  500 MG tablet Take 1,000 mg by mouth 2 (two) times daily. scheduled    . amoxicillin (AMOXIL) 500 MG capsule Take 500 mg by mouth See admin instructions. Take 4 capsules (2000 mg) by mouth prior to dental appointment    . apixaban (ELIQUIS) 5 MG TABS tablet Take 5 mg by mouth 2 (two) times daily.    . Ascorbic Acid (VITAMIN C) 1000 MG tablet Take 1,000 mg by mouth daily.    Marland Kitchen b complex vitamins tablet Take 1 tablet by mouth daily.    . beta carotene w/minerals (OCUVITE) tablet Take 1 tablet by mouth 2 (two) times daily.    Marland Kitchen CALCIUM CITRATE PO Take 1 tablet by mouth daily.     . carvedilol (COREG) 3.125 MG tablet Take 3.125-6.25 mg by mouth 2 (two) times daily as needed (high blood pressure). If blood pressure under 100 do not take any of this medication, between 100 and 140 take 1 tablet (3.125 mg), over 140 take 2 tablets (6.25 mg)    . [place]asc[place] 450 MG CAPS Take 450 mg by mouth daily as needed (constipation).    . Cholecalciferol (VITAMIN D-3) 5000 UNITS TABS Take 5,000 Units by mouth daily.    . cyclobenzaprine (FLEXERIL) 10 MG tablet Take 1 tablet (10 mg total) by mouth 2 (two) times daily as needed for muscle spasms. 20 tablet 0  . finasteride (PROSCAR) 5 MG tablet Take  5 mg by mouth daily.     . furosemide (LASIX) 20 MG tablet Take 20 mg by mouth daily.    Marland Kitchen HYDROcodone-acetaminophen (NORCO/VICODIN) 5-325 MG per tablet Take 1-2 tablets by mouth every 4 (four) hours as needed for moderate pain or severe pain. 15 tablet 0  . lactulose (CHRONULAC) 10 GM/15ML solution Take 20 g by mouth at bedtime.    . Mouthwashes (BIOTENE DRY MOUTH MT) Use as directed 10 mLs in the mouth or throat daily as needed (dry mouth).    . Multiple Vitamin (MULITIVITAMIN WITH MINERALS) TABS Take 1 tablet by mouth daily.     . Omega-3 Fatty Acids (FISH OIL) 1200 MG CAPS Take 2,400 mg by mouth 2 (two) times daily.    Marland Kitchen oxybutynin (DITROPAN-XL) 5 MG 24 hr tablet Take 5 mg by mouth at bedtime.    . polyvinyl alcohol  (LIQUIFILM TEARS) 1.4 % ophthalmic solution Place 1 drop into both eyes 5 (five) times daily as needed for dry eyes.     . pravastatin (PRAVACHOL) 40 MG tablet Take 40 mg by mouth at bedtime.    . Sodium Fluoride (FLUORIDEX DAILY DEFENSE DT) Place 1 application onto teeth daily.    . Tamsulosin HCl (FLOMAX) 0.4 MG CAPS Take 0.4 mg by mouth 2 (two) times daily.     . traMADol (ULTRAM) 50 MG tablet Take 100 mg by mouth 2 (two) times daily. scheduled    . zolpidem (AMBIEN) 10 MG tablet Take 10 mg by mouth at bedtime as needed for sleep.     . [DISCONTINUED] buPROPion (WELLBUTRIN) 100 MG tablet Take 200 mg by mouth daily.      . [DISCONTINUED] warfarin (COUMADIN) 2 MG tablet Take 2 mg by mouth daily. 1 tablet on Sunday, Tuesday, Wednesday, Friday, and Saturday 1/2 tablet on Monday and Thursday     No current facility-administered medications for this visit.    Allergies:   Amiodarone   Social History: Social History   Social History  . Marital Status: Widowed    Spouse Name: N/A  . Number of Children: N/A  . Years of Education: N/A   Occupational History  . Not on file.   Social History Main Topics  . Smoking status: Former Smoker    Types: Cigarettes    Quit date: 05/13/1971  . Smokeless tobacco: Never Used  . Alcohol Use: Yes     Comment: occasional  . Drug Use: No  . Sexual Activity: Not on file   Other Topics Concern  . Not on file   Social History Narrative    Review of Systems: All other systems reviewed and are otherwise negative except as noted above.   Physical Exam: VS:  BP 140/60 mmHg  Pulse 75  Ht 5\' 8"  (1.727 m)  Wt 172 lb 3.2 oz (78.109 kg)  BMI 26.19 kg/m2 , BMI Body mass index is 26.19 kg/(m^2).  GEN- The patient is elderly appearing, alert and oriented x 3 today.   HEENT: normocephalic, atraumatic; sclera clear, conjunctiva pink; hearing intact; oropharynx clear; neck supple  Lungs- Clear to ausculation bilaterally, normal work of breathing.  No  wheezes, rales, rhonchi Heart- Irregular rate and rhythm  GI- soft, non-tender, non-distended, bowel sounds present Extremities- no clubbing, cyanosis, or edema  MS- no significant deformity or atrophy Skin- warm and dry, no rash or lesion; PPM pocket well healed Psych- euthymic mood, full affect Neuro- strength and sensation are intact  PPM Interrogation- reviewed in detail today,  See  PACEART report  EKG:  EKG is not ordered today.   Wt Readings from Last 3 Encounters:  06/25/15 172 lb 3.2 oz (78.109 kg)  06/16/14 165 lb (74.844 kg)  04/05/14 172 lb (78.019 kg)     Other studies Reviewed: Additional studies/ records that were reviewed today include: Dr Olin Pia office notes  Assessment and Plan:  1.  Tachy/brady syndrome/permanent atrial fibrillation Normal PPM function See Pace Art report No changes today Continue Eliquis for CHADS2VASC of at least 3 - CBC, BMET followed by [facility]   Current medicines are reviewed at length with the patient today.   The patient does not have concerns regarding his medicines.  The following changes were made today:  none  Labs/ tests ordered today include: none   Disposition:   Follow up with [facility] transmissions, Dr Caryl Comes in 1 year   Signed, Chanetta Marshall, NP 06/25/2015 11:00 AM  [place] [phone] (office) [phone] (fax)

## 2015-06-28 DIAGNOSIS — I482 Chronic atrial fibrillation: Secondary | ICD-10-CM | POA: Diagnosis not present

## 2015-06-28 DIAGNOSIS — I495 Sick sinus syndrome: Secondary | ICD-10-CM | POA: Diagnosis not present

## 2015-06-28 DIAGNOSIS — E785 Hyperlipidemia, unspecified: Secondary | ICD-10-CM | POA: Diagnosis not present

## 2015-06-28 DIAGNOSIS — R7309 Other abnormal glucose: Secondary | ICD-10-CM | POA: Diagnosis not present

## 2015-06-28 DIAGNOSIS — M1990 Unspecified osteoarthritis, unspecified site: Secondary | ICD-10-CM | POA: Diagnosis not present

## 2015-06-28 DIAGNOSIS — N189 Chronic kidney disease, unspecified: Secondary | ICD-10-CM | POA: Diagnosis not present

## 2015-06-28 DIAGNOSIS — I1310 Hypertensive heart and chronic kidney disease without heart failure, with stage 1 through stage 4 chronic kidney disease, or unspecified chronic kidney disease: Secondary | ICD-10-CM | POA: Diagnosis not present

## 2015-06-28 DIAGNOSIS — M4800 Spinal stenosis, site unspecified: Secondary | ICD-10-CM | POA: Diagnosis not present

## 2015-06-28 DIAGNOSIS — I638 Other cerebral infarction: Secondary | ICD-10-CM | POA: Diagnosis not present

## 2015-07-13 DIAGNOSIS — Z79891 Long term (current) use of opiate analgesic: Secondary | ICD-10-CM | POA: Diagnosis not present

## 2015-07-13 DIAGNOSIS — G894 Chronic pain syndrome: Secondary | ICD-10-CM | POA: Diagnosis not present

## 2015-07-13 DIAGNOSIS — M5136 Other intervertebral disc degeneration, lumbar region: Secondary | ICD-10-CM | POA: Diagnosis not present

## 2015-08-13 ENCOUNTER — Encounter: Payer: Self-pay | Admitting: Internal Medicine

## 2015-09-05 DIAGNOSIS — M5136 Other intervertebral disc degeneration, lumbar region: Secondary | ICD-10-CM | POA: Diagnosis not present

## 2015-09-24 ENCOUNTER — Ambulatory Visit (INDEPENDENT_AMBULATORY_CARE_PROVIDER_SITE_OTHER): Payer: Medicare Other | Admitting: *Deleted

## 2015-09-24 DIAGNOSIS — I495 Sick sinus syndrome: Secondary | ICD-10-CM | POA: Diagnosis not present

## 2015-09-24 NOTE — Progress Notes (Signed)
Remote pacemaker transmission.   

## 2015-10-01 ENCOUNTER — Ambulatory Visit: Payer: Medicare Other | Admitting: Cardiology

## 2015-10-11 DIAGNOSIS — Z950 Presence of cardiac pacemaker: Secondary | ICD-10-CM | POA: Diagnosis not present

## 2015-10-11 DIAGNOSIS — N189 Chronic kidney disease, unspecified: Secondary | ICD-10-CM | POA: Diagnosis not present

## 2015-10-11 DIAGNOSIS — I1310 Hypertensive heart and chronic kidney disease without heart failure, with stage 1 through stage 4 chronic kidney disease, or unspecified chronic kidney disease: Secondary | ICD-10-CM | POA: Diagnosis not present

## 2015-10-11 DIAGNOSIS — I495 Sick sinus syndrome: Secondary | ICD-10-CM | POA: Diagnosis not present

## 2015-10-11 DIAGNOSIS — R7309 Other abnormal glucose: Secondary | ICD-10-CM | POA: Diagnosis not present

## 2015-10-11 DIAGNOSIS — I482 Chronic atrial fibrillation: Secondary | ICD-10-CM | POA: Diagnosis not present

## 2015-10-11 DIAGNOSIS — E785 Hyperlipidemia, unspecified: Secondary | ICD-10-CM | POA: Diagnosis not present

## 2015-10-11 DIAGNOSIS — R0609 Other forms of dyspnea: Secondary | ICD-10-CM | POA: Diagnosis not present

## 2015-10-19 ENCOUNTER — Encounter: Payer: Self-pay | Admitting: Cardiology

## 2015-10-19 LAB — CUP PACEART REMOTE DEVICE CHECK
Battery Remaining Longevity: 114 mo
Battery Remaining Percentage: 100 %
Brady Statistic RV Percent Paced: 67 %
Date Time Interrogation Session: 20170227054000
Implantable Lead Implant Date: 20000401
Implantable Lead Location: 753860
Implantable Lead Model: 4245
Implantable Lead Serial Number: 402472
Lead Channel Impedance Value: 460 Ohm
Lead Channel Pacing Threshold Amplitude: 0.9 V
Lead Channel Pacing Threshold Pulse Width: 0.4 ms
Lead Channel Setting Pacing Amplitude: 1.3 V
Lead Channel Setting Pacing Pulse Width: 0.4 ms
Lead Channel Setting Sensing Sensitivity: 2.5 mV
Pulse Gen Serial Number: 390662

## 2015-10-25 DIAGNOSIS — I10 Essential (primary) hypertension: Secondary | ICD-10-CM | POA: Diagnosis not present

## 2015-10-25 DIAGNOSIS — N189 Chronic kidney disease, unspecified: Secondary | ICD-10-CM | POA: Diagnosis not present

## 2015-10-25 DIAGNOSIS — R7303 Prediabetes: Secondary | ICD-10-CM | POA: Diagnosis not present

## 2015-10-25 DIAGNOSIS — R0609 Other forms of dyspnea: Secondary | ICD-10-CM | POA: Diagnosis not present

## 2015-10-25 DIAGNOSIS — E785 Hyperlipidemia, unspecified: Secondary | ICD-10-CM | POA: Diagnosis not present

## 2015-10-25 DIAGNOSIS — R7309 Other abnormal glucose: Secondary | ICD-10-CM | POA: Diagnosis not present

## 2015-10-25 DIAGNOSIS — I639 Cerebral infarction, unspecified: Secondary | ICD-10-CM | POA: Diagnosis not present

## 2015-10-25 DIAGNOSIS — I495 Sick sinus syndrome: Secondary | ICD-10-CM | POA: Diagnosis not present

## 2015-10-25 DIAGNOSIS — I1310 Hypertensive heart and chronic kidney disease without heart failure, with stage 1 through stage 4 chronic kidney disease, or unspecified chronic kidney disease: Secondary | ICD-10-CM | POA: Diagnosis not present

## 2015-10-25 DIAGNOSIS — I482 Chronic atrial fibrillation: Secondary | ICD-10-CM | POA: Diagnosis not present

## 2015-12-25 ENCOUNTER — Ambulatory Visit (INDEPENDENT_AMBULATORY_CARE_PROVIDER_SITE_OTHER): Payer: Medicare Other | Admitting: *Deleted

## 2015-12-25 DIAGNOSIS — I495 Sick sinus syndrome: Secondary | ICD-10-CM | POA: Diagnosis not present

## 2015-12-26 NOTE — Progress Notes (Signed)
Remote pacemaker transmission.   

## 2016-01-10 LAB — CUP PACEART REMOTE DEVICE CHECK
Battery Remaining Longevity: 114 mo
Battery Remaining Percentage: 100 %
Brady Statistic RV Percent Paced: 68 %
Date Time Interrogation Session: 20170530044100
Implantable Lead Implant Date: 20000401
Implantable Lead Location: 753860
Implantable Lead Model: 4245
Implantable Lead Serial Number: 402472
Lead Channel Impedance Value: 462 Ohm
Lead Channel Pacing Threshold Amplitude: 0.7 V
Lead Channel Pacing Threshold Pulse Width: 0.4 ms
Lead Channel Setting Pacing Amplitude: 1.2 V
Lead Channel Setting Pacing Pulse Width: 0.4 ms
Lead Channel Setting Sensing Sensitivity: 2.5 mV
Pulse Gen Serial Number: 390662

## 2016-01-15 ENCOUNTER — Encounter: Payer: Self-pay | Admitting: Cardiology

## 2016-01-17 DIAGNOSIS — I10 Essential (primary) hypertension: Secondary | ICD-10-CM | POA: Diagnosis not present

## 2016-01-17 DIAGNOSIS — M1990 Unspecified osteoarthritis, unspecified site: Secondary | ICD-10-CM | POA: Diagnosis not present

## 2016-01-17 DIAGNOSIS — I482 Chronic atrial fibrillation: Secondary | ICD-10-CM | POA: Diagnosis not present

## 2016-01-17 DIAGNOSIS — M4800 Spinal stenosis, site unspecified: Secondary | ICD-10-CM | POA: Diagnosis not present

## 2016-01-17 DIAGNOSIS — Z950 Presence of cardiac pacemaker: Secondary | ICD-10-CM | POA: Diagnosis not present

## 2016-01-17 DIAGNOSIS — R7309 Other abnormal glucose: Secondary | ICD-10-CM | POA: Diagnosis not present

## 2016-01-17 DIAGNOSIS — I639 Cerebral infarction, unspecified: Secondary | ICD-10-CM | POA: Diagnosis not present

## 2016-01-17 DIAGNOSIS — E785 Hyperlipidemia, unspecified: Secondary | ICD-10-CM | POA: Diagnosis not present

## 2016-01-17 DIAGNOSIS — I495 Sick sinus syndrome: Secondary | ICD-10-CM | POA: Diagnosis not present

## 2016-01-24 ENCOUNTER — Telehealth: Payer: Self-pay | Admitting: Internal Medicine

## 2016-01-24 NOTE — Telephone Encounter (Signed)
New Message:   Please call,pt says he does not know how to do the remote check over the phone.

## 2016-01-24 NOTE — Telephone Encounter (Signed)
Spoke w/ pt and informed him that his home monitor is automatic. As long as it stays plugged up by his bed it will check his device every night while he sleeps. If we ever needed him to send a manual transmission someone would call and instruct him how to do that at that time. Pt verbalized understanding.

## 2016-02-21 DIAGNOSIS — Z471 Aftercare following joint replacement surgery: Secondary | ICD-10-CM | POA: Diagnosis not present

## 2016-02-21 DIAGNOSIS — Z96643 Presence of artificial hip joint, bilateral: Secondary | ICD-10-CM | POA: Diagnosis not present

## 2016-03-16 ENCOUNTER — Emergency Department (HOSPITAL_COMMUNITY): Payer: Medicare Other

## 2016-03-16 ENCOUNTER — Encounter (HOSPITAL_COMMUNITY): Payer: Self-pay

## 2016-03-16 ENCOUNTER — Emergency Department (HOSPITAL_COMMUNITY)
Admission: EM | Admit: 2016-03-16 | Discharge: 2016-03-16 | Disposition: A | Discharge status: Home / Self Care | Payer: Medicare Other | Attending: Emergency Medicine | Admitting: Emergency Medicine

## 2016-03-16 DIAGNOSIS — Y92009 Unspecified place in unspecified non-institutional (private) residence as the place of occurrence of the external cause: Secondary | ICD-10-CM | POA: Diagnosis not present

## 2016-03-16 DIAGNOSIS — W228XXA Striking against or struck by other objects, initial encounter: Secondary | ICD-10-CM | POA: Diagnosis not present

## 2016-03-16 DIAGNOSIS — Z79899 Other long term (current) drug therapy: Secondary | ICD-10-CM | POA: Insufficient documentation

## 2016-03-16 DIAGNOSIS — I110 Hypertensive heart disease with heart failure: Secondary | ICD-10-CM | POA: Diagnosis not present

## 2016-03-16 DIAGNOSIS — Y939 Activity, unspecified: Secondary | ICD-10-CM | POA: Insufficient documentation

## 2016-03-16 DIAGNOSIS — Y999 Unspecified external cause status: Secondary | ICD-10-CM | POA: Diagnosis not present

## 2016-03-16 DIAGNOSIS — Z87891 Personal history of nicotine dependence: Secondary | ICD-10-CM | POA: Diagnosis not present

## 2016-03-16 DIAGNOSIS — S0990XA Unspecified injury of head, initial encounter: Secondary | ICD-10-CM | POA: Diagnosis not present

## 2016-03-16 DIAGNOSIS — Z7901 Long term (current) use of anticoagulants: Secondary | ICD-10-CM | POA: Insufficient documentation

## 2016-03-16 DIAGNOSIS — Z950 Presence of cardiac pacemaker: Secondary | ICD-10-CM | POA: Insufficient documentation

## 2016-03-16 DIAGNOSIS — I509 Heart failure, unspecified: Secondary | ICD-10-CM | POA: Diagnosis not present

## 2016-03-16 DIAGNOSIS — R0781 Pleurodynia: Secondary | ICD-10-CM | POA: Diagnosis not present

## 2016-03-16 DIAGNOSIS — W19XXXA Unspecified fall, initial encounter: Secondary | ICD-10-CM

## 2016-03-16 DIAGNOSIS — S299XXA Unspecified injury of thorax, initial encounter: Secondary | ICD-10-CM | POA: Diagnosis not present

## 2016-03-16 NOTE — ED Provider Notes (Signed)
[facility] DEPT Provider Note   CSN: [id number] Arrival date & time: 03/16/16  1142     History   Chief Complaint Chief Complaint  Patient presents with  . Fall    HPI [name] is a 82 y.o. male.  Patient presents today with pain of his left lower ribs.  He states that the pain has been constant since he fell two days ago, but is improving somewhat.  He states that he was ambulating with a walker to the table with his tray.  He then let go of the walker to place his tray on the table and fell landing on his left side.  He reports hitting his head, but did not lose consciousness.  He states that he has been ambulating since the fall.  He denies any nausea, vomiting, vision changes, focal weakness, numbness, tingling, dizziness, SOB, cough, or any other symptoms.  He is currently on Eliquis for his history of Atrial Fibrillation.      Past Medical History:  Diagnosis Date  . Arthritis    incl spinal stenosis  . Atrial fibrillation ([place])   . CHF (congestive heart failure) ([place])   . HTN (hypertension)   . Renal insufficiency    question degree  . Wears glasses    reading  . Wears partial dentures    upper partial    Patient Active Problem List   Diagnosis Date Noted  . CHF (congestive heart failure) ([place])   . Hypertension 09/17/2012  . Pneumonia 12/03/2011  . Syncope 10/13/2011  . ATRIAL FIBRILLATION 02/19/2010  . RENAL INSUFFICIENCY 02/19/2010  . PACEMAKER, single [employer] 02/19/2010    Past Surgical History:  Procedure Laterality Date  . bilateral shoulder surgery    . EYE SURGERY     both catararcts  . I&D EXTREMITY  12/07/2011   Procedure: IRRIGATION AND DEBRIDEMENT EXTREMITY;  Surgeon: Tennis Must, MD;  Location: [facility];  Service: Orthopedics;  Laterality: Left;  . PACEMAKER GENERATOR CHANGE N/A 11/28/2013   Procedure: PACEMAKER GENERATOR CHANGE;  Surgeon: Deboraha Sprang, MD;  Location: [facility] CATH LAB;  Service: Cardiovascular;   Laterality: N/A;  . PACEMAKER INSERTION     BSX pacemaker generator change 11-28-2013 by Dr Caryl Comes  . prosthetic hip - bilaterally    . R knee quadricep tendon repair    . TONSILLECTOMY    . TRIGGER FINGER RELEASE Right 05/26/2013   Procedure: RIGHT SMALL TRIGGER RELEASE ;  Surgeon: Tennis Must, MD;  Location: [facility];  Service: Orthopedics;  Laterality: Right;       Home Medications    Prior to Admission medications   Medication Sig Start Date End Date Taking? Authorizing Provider  acetaminophen (TYLENOL) 500 MG tablet Take 1,000 mg by mouth 2 (two) times daily. scheduled   Yes Historical Provider, MD  amoxicillin (AMOXIL) 500 MG capsule Take 500 mg by mouth See admin instructions. Take 4 capsules (2000 mg) by mouth prior to dental appointment   Yes Historical Provider, MD  apixaban (ELIQUIS) 5 MG TABS tablet Take 5 mg by mouth 2 (two) times daily.   Yes Historical Provider, MD  Ascorbic Acid (VITAMIN C) 1000 MG tablet Take 1,000 mg by mouth daily.   Yes Historical Provider, MD  b complex vitamins tablet Take 1 tablet by mouth daily.   Yes Historical Provider, MD  beta carotene w/minerals (OCUVITE) tablet Take 1 tablet by mouth 2 (two) times daily.   Yes Historical Provider, MD  bisacodyl (DULCOLAX)  5 MG EC tablet Take 10 mg by mouth daily as needed for moderate constipation.   Yes Historical Provider, MD  CALCIUM CITRATE PO Take 1 tablet by mouth daily.    Yes Historical Provider, MD  carvedilol (COREG) 3.125 MG tablet Take 3.125-6.25 mg by mouth 2 (two) times daily as needed (high blood pressure). If blood pressure under 100 do not take any of this medication, between 100 and 140 take 1 tablet (3.125 mg), over 140 take 2 tablets (6.25 mg) 02/11/11  Yes Deboraha Sprang, MD  Cholecalciferol (VITAMIN D-3) 5000 UNITS TABS Take 5,000 Units by mouth daily.   Yes Historical Provider, MD  finasteride (PROSCAR) 5 MG tablet Take 5 mg by mouth daily.    Yes Historical Provider, MD    furosemide (LASIX) 20 MG tablet Take 20 mg by mouth daily.   Yes Historical Provider, MD  Mouthwashes (BIOTENE DRY MOUTH MT) Use as directed 10 mLs in the mouth or throat daily as needed (dry mouth).   Yes Historical Provider, MD  Multiple Vitamin (MULITIVITAMIN WITH MINERALS) TABS Take 1 tablet by mouth daily.    Yes Historical Provider, MD  Omega-3 Fatty Acids (FISH OIL) 1200 MG CAPS Take 2,400 mg by mouth 2 (two) times daily.   Yes Historical Provider, MD  oxybutynin (DITROPAN-XL) 5 MG 24 hr tablet Take 5 mg by mouth at bedtime.   Yes Historical Provider, MD  polyvinyl alcohol (LIQUIFILM TEARS) 1.4 % ophthalmic solution Place 1 drop into both eyes 5 (five) times daily as needed for dry eyes.    Yes Historical Provider, MD  pravastatin (PRAVACHOL) 40 MG tablet Take 40 mg by mouth at bedtime.   Yes Historical Provider, MD  Sodium Fluoride (FLUORIDEX DAILY DEFENSE DT) Place 1 application onto teeth daily.   Yes Historical Provider, MD  Tamsulosin HCl (FLOMAX) 0.4 MG CAPS Take 0.4 mg by mouth 2 (two) times daily.    Yes Historical Provider, MD  traMADol (ULTRAM) 50 MG tablet Take 100 mg by mouth 2 (two) times daily. scheduled   Yes Historical Provider, MD  Cascara Sagrada 450 MG CAPS Take 450 mg by mouth daily as needed (constipation).    Historical Provider, MD  cyclobenzaprine (FLEXERIL) 10 MG tablet Take 1 tablet (10 mg total) by mouth 2 (two) times daily as needed for muscle spasms. Patient not taking: Reported on 03/16/2016 06/18/14   [name], [name]  HYDROcodone-acetaminophen (NORCO/VICODIN) 5-325 MG per tablet Take 1-2 tablets by mouth every 4 (four) hours as needed for moderate pain or severe pain. Patient not taking: Reported on 03/16/2016 06/18/14   [name], [name]  zolpidem (AMBIEN) 10 MG tablet Take 10 mg by mouth at bedtime as needed for sleep.     Historical Provider, MD    Family History No family history on file.  Social History Social History  Substance Use  Topics  . Smoking status: Former Smoker    Types: Cigarettes    Quit date: 05/13/1971  . Smokeless tobacco: Never Used  . Alcohol use Yes     Comment: occasional     Allergies   Amiodarone   Review of Systems Review of Systems  All other systems reviewed and are negative.    Physical Exam Updated Vital Signs BP 154/85   Pulse (!) 58   Temp 98.4 F (36.9 C) (Oral)   Resp 18   Ht 5\' 7"  (1.702 m)   SpO2 99%   Physical Exam  Constitutional: He appears well-developed and well-nourished.  HENT:  Head: Normocephalic and atraumatic.  Eyes: EOM are normal. Pupils are equal, round, and reactive to light.  Neck: Normal range of motion. Neck supple.  Cardiovascular: Normal rate, regular rhythm and normal heart sounds.   Pulmonary/Chest: Effort normal and breath sounds normal.  Musculoskeletal: Normal range of motion.       Cervical back: He exhibits normal range of motion, no tenderness, no bony tenderness, no swelling, no edema and no deformity.       Thoracic back: He exhibits normal range of motion, no tenderness, no bony tenderness, no swelling, no edema and no deformity.       Lumbar back: He exhibits normal range of motion, no tenderness, no bony tenderness, no swelling, no edema and no deformity.  Full ROM of all extremities without pain  Neurological: He is alert. He has normal strength. No cranial nerve deficit or sensory deficit. Coordination normal.  Skin: Skin is warm and dry.  Psychiatric: He has a normal mood and affect.  Nursing note and vitals reviewed.    ED Treatments / Results  Labs (all labs ordered are listed, but only abnormal results are displayed) Labs Reviewed - No data to display  EKG  EKG Interpretation None       Radiology Dg Ribs Unilateral W/chest Left  Result Date: 03/16/2016 CLINICAL DATA:  [place] yesterday.  Left posterior rib pain. EXAM: LEFT RIBS AND CHEST - 3+ VIEW COMPARISON:  06/16/2014 FINDINGS: The heart is enlarged. Dual lead  pacemaker is in place. There is aortic atherosclerosis. There is pulmonary scarring in the lower lungs. No evidence of active infiltrate, mass, effusion or collapse. No pneumothorax or hemothorax. There are old healed fractures of the left eighth and ninth ribs laterally. Skin marker is in place below that. The lower ribs are not well seen because of osteopenia abdominal content density. I do not see a rib fracture, but confidence is low. IMPRESSION: Cardiomegaly. Pacemaker. Pulmonary scarring. Aortic atherosclerosis. Old healed rib fractures of the left eighth and ninth ribs laterally. Ribs below that are poorly seen because of osteopenia and abdominal density. I do not see rib fracture, but this is not a high confidence evaluation of the lower ribs because of the limited information. Electronically Signed   By: Nelson Chimes M.D.   On: 03/16/2016 13:10    Procedures Procedures (including critical care time)  Medications Ordered in ED Medications - No data to display   Initial Impression / Assessment and Plan / ED Course  I have reviewed the triage vital signs and the nursing notes.  Pertinent labs & imaging results that were available during my care of the patient were reviewed by me and considered in my medical decision making (see chart for details).  Clinical Course   Patient presents today with a chief complaint of left lower rib pain that has been present since a mechanical fall that occurred two days ago.  He reports hitting his head, but no LOC.  He is on Eliquis.  CT head is negative.  Xray of left ribs does not show definite rib fractures, but visualization on xray was limited.  No pneumothorax.  NO hypoxia.  He denies SOB.  He states that his pain has been controlled with the Ultram that he has at home.   He has full ROM of extremities and has been ambulatory since the fall.  No spinal tenderness on exam.   Stable for discharge.  Return precautions given.  Case discussed with Dr. Wilson Singer.  Final Clinical Impressions(s) / ED Diagnoses   Final diagnoses:  None    New Prescriptions New Prescriptions   No medications on file     Margarita Sermons 03/19/16 [place], MD 03/20/16 1323

## 2016-03-16 NOTE — Discharge Instructions (Signed)
Continue to take Tramadol for pain.

## 2016-03-16 NOTE — ED Triage Notes (Signed)
Patient fell 2 days ago in home and fell striking ribs on floor and elbow, states that the pain radiates to thoracic back. No respiratory distress

## 2016-03-16 NOTE — ED Notes (Signed)
EDP at bedside  

## 2016-03-25 ENCOUNTER — Ambulatory Visit (INDEPENDENT_AMBULATORY_CARE_PROVIDER_SITE_OTHER): Payer: Medicare Other | Admitting: *Deleted

## 2016-03-25 DIAGNOSIS — I495 Sick sinus syndrome: Secondary | ICD-10-CM

## 2016-03-26 NOTE — Progress Notes (Signed)
Remote pacemaker transmission.   

## 2016-03-27 ENCOUNTER — Encounter: Payer: Self-pay | Admitting: Cardiology

## 2016-04-17 DIAGNOSIS — R7309 Other abnormal glucose: Secondary | ICD-10-CM | POA: Diagnosis not present

## 2016-04-17 DIAGNOSIS — I482 Chronic atrial fibrillation: Secondary | ICD-10-CM | POA: Diagnosis not present

## 2016-04-17 DIAGNOSIS — I10 Essential (primary) hypertension: Secondary | ICD-10-CM | POA: Diagnosis not present

## 2016-04-17 DIAGNOSIS — M1990 Unspecified osteoarthritis, unspecified site: Secondary | ICD-10-CM | POA: Diagnosis not present

## 2016-04-17 DIAGNOSIS — E785 Hyperlipidemia, unspecified: Secondary | ICD-10-CM | POA: Diagnosis not present

## 2016-04-17 DIAGNOSIS — I495 Sick sinus syndrome: Secondary | ICD-10-CM | POA: Diagnosis not present

## 2016-04-17 DIAGNOSIS — Z950 Presence of cardiac pacemaker: Secondary | ICD-10-CM | POA: Diagnosis not present

## 2016-04-17 DIAGNOSIS — I639 Cerebral infarction, unspecified: Secondary | ICD-10-CM | POA: Diagnosis not present

## 2016-04-17 LAB — CUP PACEART REMOTE DEVICE CHECK
Battery Remaining Longevity: 108 mo
Battery Remaining Percentage: 100 %
Brady Statistic RV Percent Paced: 69 %
Date Time Interrogation Session: 20170829044100
Implantable Lead Implant Date: 20000401
Implantable Lead Location: 753860
Implantable Lead Model: 4245
Implantable Lead Serial Number: 402472
Lead Channel Impedance Value: 451 Ohm
Lead Channel Pacing Threshold Amplitude: 0.9 V
Lead Channel Pacing Threshold Pulse Width: 0.4 ms
Lead Channel Setting Pacing Amplitude: 1.2 V
Lead Channel Setting Pacing Pulse Width: 0.4 ms
Lead Channel Setting Sensing Sensitivity: 2.5 mV
Pulse Gen Serial Number: 390662

## 2016-05-27 DIAGNOSIS — M5416 Radiculopathy, lumbar region: Secondary | ICD-10-CM | POA: Diagnosis not present

## 2016-05-27 DIAGNOSIS — G894 Chronic pain syndrome: Secondary | ICD-10-CM | POA: Diagnosis not present

## 2016-05-27 DIAGNOSIS — M5136 Other intervertebral disc degeneration, lumbar region: Secondary | ICD-10-CM | POA: Diagnosis not present

## 2016-05-27 DIAGNOSIS — Z79891 Long term (current) use of opiate analgesic: Secondary | ICD-10-CM | POA: Diagnosis not present

## 2016-06-06 ENCOUNTER — Telehealth: Payer: Self-pay | Admitting: Internal Medicine

## 2016-06-06 NOTE — Telephone Encounter (Signed)
Per pt his Internet connect will be severed on the 15 Nov.  Pt would like a call back on what his Dr. Lynnda Child like him to do.

## 2016-06-09 NOTE — Telephone Encounter (Signed)
Called pt, pt stated that he had talked to someone and a wireless adapter was ordered yesterday, informed pt that if he does not get the adapter in 7-10 days to call the clinic, pt voiced understanding.

## 2016-06-11 DIAGNOSIS — M5136 Other intervertebral disc degeneration, lumbar region: Secondary | ICD-10-CM | POA: Diagnosis not present

## 2016-06-12 DIAGNOSIS — M722 Plantar fascial fibromatosis: Secondary | ICD-10-CM | POA: Diagnosis not present

## 2016-06-13 DIAGNOSIS — M549 Dorsalgia, unspecified: Secondary | ICD-10-CM | POA: Diagnosis not present

## 2016-06-13 DIAGNOSIS — Z9181 History of falling: Secondary | ICD-10-CM | POA: Diagnosis not present

## 2016-06-13 DIAGNOSIS — Z96643 Presence of artificial hip joint, bilateral: Secondary | ICD-10-CM | POA: Diagnosis not present

## 2016-06-13 DIAGNOSIS — Z7901 Long term (current) use of anticoagulants: Secondary | ICD-10-CM | POA: Diagnosis not present

## 2016-06-13 DIAGNOSIS — Z87891 Personal history of nicotine dependence: Secondary | ICD-10-CM | POA: Diagnosis not present

## 2016-06-13 DIAGNOSIS — M722 Plantar fascial fibromatosis: Secondary | ICD-10-CM | POA: Diagnosis not present

## 2016-06-18 DIAGNOSIS — M722 Plantar fascial fibromatosis: Secondary | ICD-10-CM | POA: Diagnosis not present

## 2016-06-18 DIAGNOSIS — M549 Dorsalgia, unspecified: Secondary | ICD-10-CM | POA: Diagnosis not present

## 2016-06-18 DIAGNOSIS — Z7901 Long term (current) use of anticoagulants: Secondary | ICD-10-CM | POA: Diagnosis not present

## 2016-06-18 DIAGNOSIS — Z87891 Personal history of nicotine dependence: Secondary | ICD-10-CM | POA: Diagnosis not present

## 2016-06-18 DIAGNOSIS — Z9181 History of falling: Secondary | ICD-10-CM | POA: Diagnosis not present

## 2016-06-18 DIAGNOSIS — Z96643 Presence of artificial hip joint, bilateral: Secondary | ICD-10-CM | POA: Diagnosis not present

## 2016-06-20 DIAGNOSIS — M549 Dorsalgia, unspecified: Secondary | ICD-10-CM | POA: Diagnosis not present

## 2016-06-20 DIAGNOSIS — Z87891 Personal history of nicotine dependence: Secondary | ICD-10-CM | POA: Diagnosis not present

## 2016-06-20 DIAGNOSIS — M722 Plantar fascial fibromatosis: Secondary | ICD-10-CM | POA: Diagnosis not present

## 2016-06-20 DIAGNOSIS — Z96643 Presence of artificial hip joint, bilateral: Secondary | ICD-10-CM | POA: Diagnosis not present

## 2016-06-20 DIAGNOSIS — Z9181 History of falling: Secondary | ICD-10-CM | POA: Diagnosis not present

## 2016-06-20 DIAGNOSIS — Z7901 Long term (current) use of anticoagulants: Secondary | ICD-10-CM | POA: Diagnosis not present

## 2016-06-24 DIAGNOSIS — M549 Dorsalgia, unspecified: Secondary | ICD-10-CM | POA: Diagnosis not present

## 2016-06-24 DIAGNOSIS — Z87891 Personal history of nicotine dependence: Secondary | ICD-10-CM | POA: Diagnosis not present

## 2016-06-24 DIAGNOSIS — Z96643 Presence of artificial hip joint, bilateral: Secondary | ICD-10-CM | POA: Diagnosis not present

## 2016-06-24 DIAGNOSIS — Z7901 Long term (current) use of anticoagulants: Secondary | ICD-10-CM | POA: Diagnosis not present

## 2016-06-24 DIAGNOSIS — Z9181 History of falling: Secondary | ICD-10-CM | POA: Diagnosis not present

## 2016-06-24 DIAGNOSIS — M722 Plantar fascial fibromatosis: Secondary | ICD-10-CM | POA: Diagnosis not present

## 2016-06-27 DIAGNOSIS — Z87891 Personal history of nicotine dependence: Secondary | ICD-10-CM | POA: Diagnosis not present

## 2016-06-27 DIAGNOSIS — Z9181 History of falling: Secondary | ICD-10-CM | POA: Diagnosis not present

## 2016-06-27 DIAGNOSIS — M722 Plantar fascial fibromatosis: Secondary | ICD-10-CM | POA: Diagnosis not present

## 2016-06-27 DIAGNOSIS — Z96643 Presence of artificial hip joint, bilateral: Secondary | ICD-10-CM | POA: Diagnosis not present

## 2016-06-27 DIAGNOSIS — M549 Dorsalgia, unspecified: Secondary | ICD-10-CM | POA: Diagnosis not present

## 2016-06-27 DIAGNOSIS — Z7901 Long term (current) use of anticoagulants: Secondary | ICD-10-CM | POA: Diagnosis not present

## 2016-07-01 DIAGNOSIS — Z9181 History of falling: Secondary | ICD-10-CM | POA: Diagnosis not present

## 2016-07-01 DIAGNOSIS — M722 Plantar fascial fibromatosis: Secondary | ICD-10-CM | POA: Diagnosis not present

## 2016-07-01 DIAGNOSIS — Z96643 Presence of artificial hip joint, bilateral: Secondary | ICD-10-CM | POA: Diagnosis not present

## 2016-07-01 DIAGNOSIS — Z87891 Personal history of nicotine dependence: Secondary | ICD-10-CM | POA: Diagnosis not present

## 2016-07-01 DIAGNOSIS — M549 Dorsalgia, unspecified: Secondary | ICD-10-CM | POA: Diagnosis not present

## 2016-07-01 DIAGNOSIS — Z7901 Long term (current) use of anticoagulants: Secondary | ICD-10-CM | POA: Diagnosis not present

## 2016-07-04 DIAGNOSIS — Z96643 Presence of artificial hip joint, bilateral: Secondary | ICD-10-CM | POA: Diagnosis not present

## 2016-07-04 DIAGNOSIS — Z87891 Personal history of nicotine dependence: Secondary | ICD-10-CM | POA: Diagnosis not present

## 2016-07-04 DIAGNOSIS — M549 Dorsalgia, unspecified: Secondary | ICD-10-CM | POA: Diagnosis not present

## 2016-07-04 DIAGNOSIS — Z7901 Long term (current) use of anticoagulants: Secondary | ICD-10-CM | POA: Diagnosis not present

## 2016-07-04 DIAGNOSIS — Z9181 History of falling: Secondary | ICD-10-CM | POA: Diagnosis not present

## 2016-07-04 DIAGNOSIS — M722 Plantar fascial fibromatosis: Secondary | ICD-10-CM | POA: Diagnosis not present

## 2016-07-07 DIAGNOSIS — Z96643 Presence of artificial hip joint, bilateral: Secondary | ICD-10-CM | POA: Diagnosis not present

## 2016-07-07 DIAGNOSIS — Z9181 History of falling: Secondary | ICD-10-CM | POA: Diagnosis not present

## 2016-07-07 DIAGNOSIS — Z87891 Personal history of nicotine dependence: Secondary | ICD-10-CM | POA: Diagnosis not present

## 2016-07-07 DIAGNOSIS — Z7901 Long term (current) use of anticoagulants: Secondary | ICD-10-CM | POA: Diagnosis not present

## 2016-07-07 DIAGNOSIS — M549 Dorsalgia, unspecified: Secondary | ICD-10-CM | POA: Diagnosis not present

## 2016-07-07 DIAGNOSIS — M722 Plantar fascial fibromatosis: Secondary | ICD-10-CM | POA: Diagnosis not present

## 2016-07-10 DIAGNOSIS — M549 Dorsalgia, unspecified: Secondary | ICD-10-CM | POA: Diagnosis not present

## 2016-07-10 DIAGNOSIS — Z87891 Personal history of nicotine dependence: Secondary | ICD-10-CM | POA: Diagnosis not present

## 2016-07-10 DIAGNOSIS — Z7901 Long term (current) use of anticoagulants: Secondary | ICD-10-CM | POA: Diagnosis not present

## 2016-07-10 DIAGNOSIS — M722 Plantar fascial fibromatosis: Secondary | ICD-10-CM | POA: Diagnosis not present

## 2016-07-10 DIAGNOSIS — Z9181 History of falling: Secondary | ICD-10-CM | POA: Diagnosis not present

## 2016-07-10 DIAGNOSIS — Z96643 Presence of artificial hip joint, bilateral: Secondary | ICD-10-CM | POA: Diagnosis not present

## 2016-07-15 DIAGNOSIS — Z96643 Presence of artificial hip joint, bilateral: Secondary | ICD-10-CM | POA: Diagnosis not present

## 2016-07-15 DIAGNOSIS — Z7901 Long term (current) use of anticoagulants: Secondary | ICD-10-CM | POA: Diagnosis not present

## 2016-07-15 DIAGNOSIS — Z87891 Personal history of nicotine dependence: Secondary | ICD-10-CM | POA: Diagnosis not present

## 2016-07-15 DIAGNOSIS — M549 Dorsalgia, unspecified: Secondary | ICD-10-CM | POA: Diagnosis not present

## 2016-07-15 DIAGNOSIS — E559 Vitamin D deficiency, unspecified: Secondary | ICD-10-CM | POA: Diagnosis not present

## 2016-07-15 DIAGNOSIS — I4891 Unspecified atrial fibrillation: Secondary | ICD-10-CM | POA: Diagnosis not present

## 2016-07-15 DIAGNOSIS — M48062 Spinal stenosis, lumbar region with neurogenic claudication: Secondary | ICD-10-CM | POA: Diagnosis not present

## 2016-07-15 DIAGNOSIS — M419 Scoliosis, unspecified: Secondary | ICD-10-CM | POA: Diagnosis not present

## 2016-07-15 DIAGNOSIS — R5383 Other fatigue: Secondary | ICD-10-CM | POA: Diagnosis not present

## 2016-07-15 DIAGNOSIS — M722 Plantar fascial fibromatosis: Secondary | ICD-10-CM | POA: Diagnosis not present

## 2016-07-15 DIAGNOSIS — R32 Unspecified urinary incontinence: Secondary | ICD-10-CM | POA: Diagnosis not present

## 2016-07-15 DIAGNOSIS — M79672 Pain in left foot: Secondary | ICD-10-CM | POA: Diagnosis not present

## 2016-07-15 DIAGNOSIS — H3530 Unspecified macular degeneration: Secondary | ICD-10-CM | POA: Diagnosis not present

## 2016-07-15 DIAGNOSIS — K5900 Constipation, unspecified: Secondary | ICD-10-CM | POA: Diagnosis not present

## 2016-07-15 DIAGNOSIS — R2689 Other abnormalities of gait and mobility: Secondary | ICD-10-CM | POA: Diagnosis not present

## 2016-07-15 DIAGNOSIS — R251 Tremor, unspecified: Secondary | ICD-10-CM | POA: Diagnosis not present

## 2016-07-15 DIAGNOSIS — Z9181 History of falling: Secondary | ICD-10-CM | POA: Diagnosis not present

## 2016-07-15 DIAGNOSIS — R269 Unspecified abnormalities of gait and mobility: Secondary | ICD-10-CM | POA: Diagnosis not present

## 2016-07-17 ENCOUNTER — Encounter: Payer: Medicare Other | Admitting: Internal Medicine

## 2016-07-17 ENCOUNTER — Encounter: Payer: Self-pay | Admitting: Internal Medicine

## 2016-07-17 ENCOUNTER — Ambulatory Visit (INDEPENDENT_AMBULATORY_CARE_PROVIDER_SITE_OTHER): Payer: Medicare Other | Admitting: Internal Medicine

## 2016-07-17 VITALS — BP 142/66 | HR 68 | Ht 67.0 in | Wt 163.0 lb

## 2016-07-17 DIAGNOSIS — Z950 Presence of cardiac pacemaker: Secondary | ICD-10-CM

## 2016-07-17 DIAGNOSIS — Z7901 Long term (current) use of anticoagulants: Secondary | ICD-10-CM | POA: Diagnosis not present

## 2016-07-17 DIAGNOSIS — M722 Plantar fascial fibromatosis: Secondary | ICD-10-CM | POA: Diagnosis not present

## 2016-07-17 DIAGNOSIS — Z96643 Presence of artificial hip joint, bilateral: Secondary | ICD-10-CM | POA: Diagnosis not present

## 2016-07-17 DIAGNOSIS — I4821 Permanent atrial fibrillation: Secondary | ICD-10-CM

## 2016-07-17 DIAGNOSIS — Z9181 History of falling: Secondary | ICD-10-CM | POA: Diagnosis not present

## 2016-07-17 DIAGNOSIS — I482 Chronic atrial fibrillation: Secondary | ICD-10-CM | POA: Diagnosis not present

## 2016-07-17 DIAGNOSIS — Z87891 Personal history of nicotine dependence: Secondary | ICD-10-CM | POA: Diagnosis not present

## 2016-07-17 DIAGNOSIS — M549 Dorsalgia, unspecified: Secondary | ICD-10-CM | POA: Diagnosis not present

## 2016-07-17 DIAGNOSIS — I495 Sick sinus syndrome: Secondary | ICD-10-CM

## 2016-07-17 NOTE — Patient Instructions (Signed)
Medication Instructions: - Your physician recommends that you continue on your current medications as directed. Please refer to the Current Medication list given to you today.  Labwork: - none ordered  Procedures/Testing: - none ordered  Follow-Up: - Remote monitoring is used to monitor your Pacemaker of ICD from home. This monitoring reduces the number of office visits required to check your device to one time per year. It allows [place] to keep an eye on the functioning of your device to ensure it is working properly. You are scheduled for a device check from home on 10/16/16. You may send your transmission at any time that day. If you have a wireless device, the transmission will be sent automatically. After your physician reviews your transmission, you will receive a postcard with your next transmission date.  - Your physician wants you to follow-up in: 1 year with Chanetta Marshall, NP for Dr. Caryl Comes. You will receive a reminder letter in the mail two months in advance. If you don't receive a letter, please call our office to schedule the follow-up appointment.  Any Additional Special Instructions Will Be Listed Below (If Applicable). ** call the office in about 3 weeks, [phone], and let Dr. Caryl Comes know how your heart rates are doing after exercise **    If you need a refill on your cardiac medications before your next appointment, please call your pharmacy.

## 2016-07-17 NOTE — Progress Notes (Signed)
Patient Care Team: Charolette Forward, MD as PCP - General (Internal Medicine)  HPI  [name] is a 82 y.o. male seen in followup for pacer implanted for tachybradycardia syndrome in 2001 with device generator replacement in 2006.   He has permanent atrial fib.  He has been on anticoagulation with apixoban. without bleeding issues .  Echo 2013-March EF 45-50%.    He notes increased SOB over recent months during his stair wallking exercises; no assoc with chest pain.   He does have arthritis  He has had a stress test by history, but never a cath. Denies prior MI         Past Medical History  Diagnosis Date  . CHF (congestive heart failure)   . Atrial fibrillation   . HTN (hypertension)   . Arthritis     incl spinal stenosis  . Renal insufficiency     question degree  . Wears partial dentures     upper partial  . Wears glasses     reading          Past Surgical History  Procedure Laterality Date  . Bilateral shoulder surgery    . Tonsillectomy    . Prosthetic hip - bilaterally    . R knee quadricep tendon repair    . I&d extremity  12/07/2011    Procedure: IRRIGATION AND DEBRIDEMENT EXTREMITY;  Surgeon: Tennis Must, MD;  Location: [facility];  Service: Orthopedics;  Laterality: Left;  Marland Kitchen Eye surgery      both catararcts  . Trigger finger release Right 05/26/2013    Procedure: RIGHT SMALL TRIGGER RELEASE ;  Surgeon: Tennis Must, MD;  Location: [facility];  Service: Orthopedics;  Laterality: Right;  . Pacemaker insertion      BSX pacemaker generator change 11-28-2013 by Dr Caryl Comes   Current Outpatient Prescriptions on File Prior to Visit  Medication Sig Dispense Refill  . acetaminophen (TYLENOL) 500 MG tablet Take 1,000 mg by mouth 2 (two) times daily. scheduled    . amoxicillin (AMOXIL) 500 MG capsule Take 500 mg by mouth See admin instructions. Take 4 capsules (2000 mg) by mouth prior to dental appointment      . apixaban (ELIQUIS) 5 MG TABS tablet Take 5 mg by mouth 2 (two) times daily.    . Ascorbic Acid (VITAMIN C) 1000 MG tablet Take 1,000 mg by mouth daily.    Marland Kitchen b complex vitamins tablet Take 1 tablet by mouth daily.    . beta carotene w/minerals (OCUVITE) tablet Take 1 tablet by mouth 2 (two) times daily.    . bisacodyl (DULCOLAX) 5 MG EC tablet Take 10 mg by mouth daily as needed for moderate constipation.    Marland Kitchen CALCIUM CITRATE PO Take 1 tablet by mouth daily.     . carvedilol (COREG) 3.125 MG tablet Take 3.125-6.25 mg by mouth 2 (two) times daily as needed (high blood pressure). If blood pressure under 100 do not take any of this medication, between 100 and 140 take 1 tablet (3.125 mg), over 140 take 2 tablets (6.25 mg)    . Cascara Sagrada 450 MG CAPS Take 450 mg by mouth daily as needed (constipation).    . Cholecalciferol (VITAMIN D-3) 5000 UNITS TABS Take 5,000 Units by mouth daily.    . cyclobenzaprine (FLEXERIL) 10 MG tablet Take 1 tablet (10 mg total) by mouth 2 (two) times daily as needed for muscle spasms. 20 tablet 0  . finasteride (PROSCAR)  5 MG tablet Take 5 mg by mouth daily.     . furosemide (LASIX) 20 MG tablet Take 20 mg by mouth daily.    Marland Kitchen HYDROcodone-acetaminophen (NORCO/VICODIN) 5-325 MG per tablet Take 1-2 tablets by mouth every 4 (four) hours as needed for moderate pain or severe pain. 15 tablet 0  . Mouthwashes (BIOTENE DRY MOUTH MT) Use as directed 10 mLs in the mouth or throat daily as needed (dry mouth).    . Multiple Vitamin (MULITIVITAMIN WITH MINERALS) TABS Take 1 tablet by mouth daily.     . Omega-3 Fatty Acids (FISH OIL) 1200 MG CAPS Take 2,400 mg by mouth 2 (two) times daily.    Marland Kitchen oxybutynin (DITROPAN-XL) 5 MG 24 hr tablet Take 5 mg by mouth at bedtime.    . polyvinyl alcohol (LIQUIFILM TEARS) 1.4 % ophthalmic solution Place 1 drop into both eyes 5 (five) times daily as needed for dry eyes.     . pravastatin (PRAVACHOL) 40 MG tablet Take 40 mg by mouth at bedtime.     . Sodium Fluoride (FLUORIDEX DAILY DEFENSE DT) Place 1 application onto teeth daily.    . Tamsulosin HCl (FLOMAX) 0.4 MG CAPS Take 0.4 mg by mouth 2 (two) times daily.     . traMADol (ULTRAM) 50 MG tablet Take 100 mg by mouth 2 (two) times daily. scheduled    . zolpidem (AMBIEN) 10 MG tablet Take 10 mg by mouth at bedtime as needed for sleep.     . [DISCONTINUED] buPROPion (WELLBUTRIN) 100 MG tablet Take 200 mg by mouth daily.      . [DISCONTINUED] warfarin (COUMADIN) 2 MG tablet Take 2 mg by mouth daily. 1 tablet on Sunday, Tuesday, Wednesday, Friday, and Saturday 1/2 tablet on Monday and Thursday     No current facility-administered medications on file prior to visit.      No current facility-administered medications for this visit.         Allergies  Allergen Reactions  . Amiodarone Other (See Comments)     fluid on lungs    Review of Systems negative except from HPI and PMH  Physical Exam BP (!) 142/66   Pulse 68   Ht 5\' 7"  (1.702 m)   Wt 163 lb (73.9 kg)   SpO2 98%   BMI 25.53 kg/m   Well developed and well nourished in no acute distress HENT normal E scleral and icterus clear Neck Supple JVP flat; carotids brisk and full Clear to ausculation Device pocket well healed; without hematoma or erythema.  There is no tethering  Regular rate and rhythm, no murmurs gallops or rub Soft with active bowel sounds No clubbing cyanosis  Edema Alert and oriented, grossly normal motor and sensory function  Walks with walker Skin Warm and Dry   ECG demonstrates atrial fibrillation with left bundle branch block Heart rate 60 Intervals-/15/43   Assessment and  Plan  Atrial fibrillation-permanent  Bradycardia-device dependent  Dyspnea on exertion   Pacemaker  [employer] The patient's device was interrogated.  The information was reviewed. No changes were made in the programming.     His atrial fibrillation is permanent.   On Anticoagulation;  No  bleeding issues   His DOE BB associated with active medications past ventricular rates on his pacemaker. I've asked him to use his home monitor to try to identify the HR associated with exercise.  We saw some ventricular high rates on his pacemaker interrogation and this might be related to his exercise  intolerance. If so we can adjust his medications. Otherwise, I am reluctant to empirically do so

## 2016-07-21 DIAGNOSIS — K5900 Constipation, unspecified: Secondary | ICD-10-CM | POA: Insufficient documentation

## 2016-07-21 DIAGNOSIS — Z87891 Personal history of nicotine dependence: Secondary | ICD-10-CM | POA: Diagnosis not present

## 2016-07-21 DIAGNOSIS — Z96643 Presence of artificial hip joint, bilateral: Secondary | ICD-10-CM | POA: Diagnosis not present

## 2016-07-21 DIAGNOSIS — Z950 Presence of cardiac pacemaker: Secondary | ICD-10-CM | POA: Diagnosis not present

## 2016-07-21 DIAGNOSIS — R339 Retention of urine, unspecified: Secondary | ICD-10-CM | POA: Insufficient documentation

## 2016-07-21 DIAGNOSIS — Z7901 Long term (current) use of anticoagulants: Secondary | ICD-10-CM | POA: Diagnosis not present

## 2016-07-21 DIAGNOSIS — K449 Diaphragmatic hernia without obstruction or gangrene: Secondary | ICD-10-CM | POA: Diagnosis not present

## 2016-07-21 DIAGNOSIS — M4800 Spinal stenosis, site unspecified: Secondary | ICD-10-CM | POA: Diagnosis not present

## 2016-07-21 DIAGNOSIS — Z79899 Other long term (current) drug therapy: Secondary | ICD-10-CM | POA: Diagnosis not present

## 2016-07-21 DIAGNOSIS — R911 Solitary pulmonary nodule: Secondary | ICD-10-CM | POA: Diagnosis not present

## 2016-07-21 DIAGNOSIS — I4891 Unspecified atrial fibrillation: Secondary | ICD-10-CM | POA: Diagnosis not present

## 2016-07-22 DIAGNOSIS — R109 Unspecified abdominal pain: Secondary | ICD-10-CM | POA: Diagnosis not present

## 2016-07-22 DIAGNOSIS — J988 Other specified respiratory disorders: Secondary | ICD-10-CM | POA: Diagnosis not present

## 2016-07-22 DIAGNOSIS — K5900 Constipation, unspecified: Secondary | ICD-10-CM | POA: Diagnosis not present

## 2016-07-22 DIAGNOSIS — I4891 Unspecified atrial fibrillation: Secondary | ICD-10-CM | POA: Diagnosis not present

## 2016-07-22 DIAGNOSIS — R339 Retention of urine, unspecified: Secondary | ICD-10-CM | POA: Diagnosis not present

## 2016-07-22 LAB — CUP PACEART INCLINIC DEVICE CHECK
Date Time Interrogation Session: 20171221050000
Implantable Lead Implant Date: 20000401
Implantable Lead Location: 753860
Implantable Lead Model: 4245
Implantable Lead Serial Number: 402472
Implantable Pulse Generator Implant Date: 20150505
Lead Channel Impedance Value: 445 Ohm
Lead Channel Pacing Threshold Amplitude: 0.8 V
Lead Channel Pacing Threshold Pulse Width: 0.4 ms
Lead Channel Sensing Intrinsic Amplitude: 11 mV
Lead Channel Setting Pacing Amplitude: 1.3 V
Lead Channel Setting Pacing Pulse Width: 0.4 ms
Lead Channel Setting Sensing Sensitivity: 2.5 mV
Pulse Gen Serial Number: 390662

## 2016-07-23 DIAGNOSIS — K5900 Constipation, unspecified: Secondary | ICD-10-CM | POA: Diagnosis not present

## 2016-07-23 DIAGNOSIS — R339 Retention of urine, unspecified: Secondary | ICD-10-CM | POA: Diagnosis not present

## 2016-07-23 DIAGNOSIS — M4800 Spinal stenosis, site unspecified: Secondary | ICD-10-CM | POA: Diagnosis not present

## 2016-07-23 DIAGNOSIS — G894 Chronic pain syndrome: Secondary | ICD-10-CM | POA: Diagnosis not present

## 2016-07-24 DIAGNOSIS — R339 Retention of urine, unspecified: Secondary | ICD-10-CM | POA: Diagnosis not present

## 2016-07-24 DIAGNOSIS — K5900 Constipation, unspecified: Secondary | ICD-10-CM | POA: Diagnosis not present

## 2016-07-24 DIAGNOSIS — M4800 Spinal stenosis, site unspecified: Secondary | ICD-10-CM | POA: Diagnosis not present

## 2016-07-24 DIAGNOSIS — G894 Chronic pain syndrome: Secondary | ICD-10-CM | POA: Diagnosis not present

## 2016-07-30 DIAGNOSIS — I10 Essential (primary) hypertension: Secondary | ICD-10-CM | POA: Diagnosis not present

## 2016-07-30 DIAGNOSIS — Z950 Presence of cardiac pacemaker: Secondary | ICD-10-CM | POA: Diagnosis not present

## 2016-07-30 DIAGNOSIS — M4800 Spinal stenosis, site unspecified: Secondary | ICD-10-CM | POA: Diagnosis not present

## 2016-07-30 DIAGNOSIS — I482 Chronic atrial fibrillation: Secondary | ICD-10-CM | POA: Diagnosis not present

## 2016-07-30 DIAGNOSIS — I495 Sick sinus syndrome: Secondary | ICD-10-CM | POA: Diagnosis not present

## 2016-07-30 DIAGNOSIS — R2681 Unsteadiness on feet: Secondary | ICD-10-CM | POA: Diagnosis not present

## 2016-07-30 DIAGNOSIS — I639 Cerebral infarction, unspecified: Secondary | ICD-10-CM | POA: Diagnosis not present

## 2016-07-30 DIAGNOSIS — M1990 Unspecified osteoarthritis, unspecified site: Secondary | ICD-10-CM | POA: Diagnosis not present

## 2016-07-30 DIAGNOSIS — E785 Hyperlipidemia, unspecified: Secondary | ICD-10-CM | POA: Diagnosis not present

## 2016-07-30 DIAGNOSIS — R7309 Other abnormal glucose: Secondary | ICD-10-CM | POA: Diagnosis not present

## 2016-08-04 ENCOUNTER — Encounter: Payer: Medicare Other | Admitting: Internal Medicine

## 2016-08-21 ENCOUNTER — Ambulatory Visit: Payer: PRIVATE HEALTH INSURANCE | Admitting: Neurology

## 2016-08-21 ENCOUNTER — Telehealth: Payer: Self-pay

## 2016-08-21 NOTE — Telephone Encounter (Signed)
Patient did not show to new patient appt.  

## 2016-08-26 ENCOUNTER — Encounter: Payer: Medicare Other | Admitting: Internal Medicine

## 2016-08-27 ENCOUNTER — Encounter: Payer: Self-pay | Admitting: Neurology

## 2016-08-29 DIAGNOSIS — E559 Vitamin D deficiency, unspecified: Secondary | ICD-10-CM | POA: Diagnosis not present

## 2016-08-29 DIAGNOSIS — K5900 Constipation, unspecified: Secondary | ICD-10-CM | POA: Diagnosis not present

## 2016-08-29 DIAGNOSIS — R2689 Other abnormalities of gait and mobility: Secondary | ICD-10-CM | POA: Diagnosis not present

## 2016-08-29 DIAGNOSIS — I4891 Unspecified atrial fibrillation: Secondary | ICD-10-CM | POA: Diagnosis not present

## 2016-08-29 DIAGNOSIS — M79672 Pain in left foot: Secondary | ICD-10-CM | POA: Diagnosis not present

## 2016-08-29 DIAGNOSIS — M48062 Spinal stenosis, lumbar region with neurogenic claudication: Secondary | ICD-10-CM | POA: Diagnosis not present

## 2016-08-29 DIAGNOSIS — R32 Unspecified urinary incontinence: Secondary | ICD-10-CM | POA: Diagnosis not present

## 2016-08-29 DIAGNOSIS — H3530 Unspecified macular degeneration: Secondary | ICD-10-CM | POA: Diagnosis not present

## 2016-08-29 DIAGNOSIS — R251 Tremor, unspecified: Secondary | ICD-10-CM | POA: Diagnosis not present

## 2016-08-29 DIAGNOSIS — R269 Unspecified abnormalities of gait and mobility: Secondary | ICD-10-CM | POA: Diagnosis not present

## 2016-08-29 DIAGNOSIS — R5383 Other fatigue: Secondary | ICD-10-CM | POA: Diagnosis not present

## 2016-08-29 DIAGNOSIS — M419 Scoliosis, unspecified: Secondary | ICD-10-CM | POA: Diagnosis not present

## 2016-09-09 ENCOUNTER — Ambulatory Visit (INDEPENDENT_AMBULATORY_CARE_PROVIDER_SITE_OTHER): Payer: Medicare Other | Admitting: Neurology

## 2016-09-09 ENCOUNTER — Encounter: Payer: Self-pay | Admitting: Neurology

## 2016-09-09 VITALS — BP 150/72 | HR 76 | Resp 20 | Ht 67.0 in | Wt 161.0 lb

## 2016-09-09 DIAGNOSIS — G20 Parkinson's disease: Secondary | ICD-10-CM

## 2016-09-09 DIAGNOSIS — R2681 Unsteadiness on feet: Secondary | ICD-10-CM | POA: Diagnosis not present

## 2016-09-09 MED ORDER — CARBIDOPA-LEVODOPA 25-100 MG PO TABS: ORAL_TABLET | ORAL | 5 refills | Status: DC

## 2016-09-09 NOTE — Patient Instructions (Addendum)
Your exam shows mild signs of parkinsonism on the left side.   As discussed, we will try low dose Sinemet (generic name: carbidopa-levodopa) 25/100 mg: Take half a pill once daily in AM around 8 AM for one week, the 1/2 pill twice daily (8 AM and noon) thereafter. We will adopt a slow and low approach, may in the future go up to half a pill 3 times a day (8 AM, noon, and 4 PM). Please try to take the medication away from you mealtimes, that is, ideally either one hour before or 2 hours after your meal to ensure optimal absorption. The medication can interfere with the protein content of your meal and trying to the protein in your food and therefore not get fully absorbed.  Common side effects reported are: Nausea, vomiting, sedation, confusion, lightheadedness. Rare side effects include hallucinations, severe nausea or vomiting, diarrhea and significant drop in blood pressure especially when going from lying to standing or from sitting to standing.   Follow up in 3 months, you can see one of our nurse practitioners and we can try to increase the Sinemet at the time.   Talk to Dr. Inda Merlin about seeing a spine specialist.

## 2016-09-09 NOTE — Progress Notes (Signed)
Subjective:    Patient ID: [name] is a 83 y.o. male.  HPI     Hector Age, MD, PhD [facility] [address] [place], [place] [address]  Dear Dr. Inda Merlin,   I saw your patient, [name], upon your kind request in my neurologic clinic today for initial consultation of his tremor affecting his left upper extremity and concern for parkinsonism. The patient is unaccompanied today, came via [facility] bus. Of note, he missed an appointment on 08/21/2016. As you know, [name] is an 83 year old right-handed gentleman with an underlying complex medical history of C1 arch fracture and C2 fracture in November 2015 after a fall, atrial fibrillation, scoliosis, spinal stenosis, macular degeneration, vitamin D deficiency, CHF, tachycardia bradycardia syndrome, status post pacemaker placement, status post multiple surgeries including shoulder surgeries, tonsillectomy, hip replacement surgeries, knee surgery, cataract repairs, trigger finger surgery on the right, pacemaker insertion in May 2015, who has been noted to have a left upper extremity resting tremor and you have also noted facial masking, some signs and symptoms of parkinsonism. I reviewed your office note from 07/15/2016, which you kindly included. He had blood work at your office including CBC with differential which was unremarkable with the exception of borderline MCV at 95.4, CMP was unremarkable with the exception of glucose at 129, TSH was normal, B12 was normal, vitamin D was low normal at 37.8, urinalysis negative.  Of note, he had a head CT without contrast on 03/16/2016 after a fall and I reviewed the results: IMPRESSION: Chronic changes as described, similar to priors. No acute intracranial abnormality. No posttraumatic sequelae are seen in this patient on Eliquis.  In addition, I personally reviewed the images through the PACS system and agree with the results, exvacuo dilatation is noted.  Global atrophy is noted.   He had a CT cervical spine without contrast on 06/16/2014 which I reviewed: IMPRESSION:  Fractures involving the C1 anterior arch and the C2 left lateral mass.  He has noted a left arm and hand tremor for the past year or 2. He has a history of degenerative neck disease and lumbar spinal stenosis, had seen Dr. Nelva Bush for this and had injections to his lower back which worked for the first time and then stop working for the second and third time as I understand. He had seen a spine specialist before and is supposed to see another spine specialist from what I understand. He lives alone in an apartment. He lives on the third floor and has [employer] an elevator. He walks with a walker, has been using a walker for the past 5 years or so. He is widowed. He has no children. He moved from [place] in 2011. He does not recall any family history of tremor or Parkinson's disease. He has noted some difficulty with walking and fine motor control and has had a long standing issue with balance and has had some falls. He denies any significant memory or mood issues. He sees cardiology regularly. He quit smoking over 40 years ago, drinks alcohol infrequently, maybe once a month or so a glass of wine typically. He does not always drink enough water.    His Past Medical History Is Significant For: Past Medical History:  Diagnosis Date  . Arthritis    incl spinal stenosis  . Atrial fibrillation ([place])   . CHF (congestive heart failure) ([place])   . HTN (hypertension)   . Renal insufficiency  question degree  . Wears glasses    reading  . Wears partial dentures    upper partial    His Past Surgical History Is Significant For: Past Surgical History:  Procedure Laterality Date  . bilateral shoulder surgery    . EYE SURGERY     both catararcts  . I&D EXTREMITY  12/07/2011   Procedure: IRRIGATION AND DEBRIDEMENT EXTREMITY;  Surgeon: Tennis Must, MD;  Location: [facility];  Service:  Orthopedics;  Laterality: Left;  . PACEMAKER GENERATOR CHANGE N/A 11/28/2013   Procedure: PACEMAKER GENERATOR CHANGE;  Surgeon: Deboraha Sprang, MD;  Location: [facility];  Service: Cardiovascular;  Laterality: N/A;  . PACEMAKER INSERTION     BSX pacemaker generator change 11-28-2013 by Dr Caryl Comes  . prosthetic hip - bilaterally    . R knee quadricep tendon repair    . TONSILLECTOMY    . TRIGGER FINGER RELEASE Right 05/26/2013   Procedure: RIGHT SMALL TRIGGER RELEASE ;  Surgeon: Tennis Must, MD;  Location: [facility];  Service: Orthopedics;  Laterality: Right;    His Family History Is Significant For: No family history on file.  His Social History Is Significant For: Social History   Social History  . Marital status: Widowed    Spouse name: N/A  . Number of children: N/A  . Years of education: N/A   Social History Main Topics  . Smoking status: Former Smoker    Types: Cigarettes    Quit date: 05/13/1971  . Smokeless tobacco: Never Used  . Alcohol use Yes     Comment: occasional  . Drug use: No  . Sexual activity: Not Asked   Other Topics Concern  . None   Social History Narrative  . None    His Allergies Are:  Allergies  Allergen Reactions  . Amiodarone Other (See Comments)     fluid on lungs  :   His Current Medications Are:  Outpatient Encounter Prescriptions as of 09/09/2016  Medication Sig  . acetaminophen (TYLENOL) 500 MG tablet Take 1,000 mg by mouth 2 (two) times daily. scheduled  . amoxicillin (AMOXIL) 500 MG capsule Take 500 mg by mouth See admin instructions. Take 4 capsules (2000 mg) by mouth prior to dental appointment  . apixaban (ELIQUIS) 5 MG TABS tablet Take 5 mg by mouth 2 (two) times daily.  . Ascorbic Acid (VITAMIN C) 1000 MG tablet Take 1,000 mg by mouth daily.  Marland Kitchen b complex vitamins tablet Take 1 tablet by mouth daily.  . beta carotene w/minerals (OCUVITE) tablet Take 1 tablet by mouth 2 (two) times daily.  . bisacodyl (DULCOLAX)  5 MG EC tablet Take 10 mg by mouth daily as needed for moderate constipation.  Marland Kitchen CALCIUM CITRATE PO Take 1 tablet by mouth daily.   . carvedilol (COREG) 3.125 MG tablet Take 3.125-6.25 mg by mouth 2 (two) times daily as needed (high blood pressure). If blood pressure under 100 do not take any of this medication, between 100 and 140 take 1 tablet (3.125 mg), over 140 take 2 tablets (6.25 mg)  . Cascara Sagrada 450 MG CAPS Take 450 mg by mouth daily as needed (constipation).  . Cholecalciferol (VITAMIN D-3) 5000 UNITS TABS Take 5,000 Units by mouth daily.  . cyclobenzaprine (FLEXERIL) 10 MG tablet Take 1 tablet (10 mg total) by mouth 2 (two) times daily as needed for muscle spasms.  . finasteride (PROSCAR) 5 MG tablet Take 5 mg by mouth daily.   . furosemide (LASIX)  20 MG tablet Take 20 mg by mouth daily.  Marland Kitchen HYDROcodone-acetaminophen (NORCO/VICODIN) 5-325 MG per tablet Take 1-2 tablets by mouth every 4 (four) hours as needed for moderate pain or severe pain.  . Mouthwashes (BIOTENE DRY MOUTH MT) Use as directed 10 mLs in the mouth or throat daily as needed (dry mouth).  . Multiple Vitamin (MULITIVITAMIN WITH MINERALS) TABS Take 1 tablet by mouth daily.   . Omega-3 Fatty Acids (FISH OIL) 1200 MG CAPS Take 2,400 mg by mouth 2 (two) times daily.  Marland Kitchen oxybutynin (DITROPAN-XL) 5 MG 24 hr tablet Take 5 mg by mouth at bedtime.  . polyvinyl alcohol (LIQUIFILM TEARS) 1.4 % ophthalmic solution Place 1 drop into both eyes 5 (five) times daily as needed for dry eyes.   . pravastatin (PRAVACHOL) 40 MG tablet Take 40 mg by mouth at bedtime.  . Sodium Fluoride (FLUORIDEX DAILY DEFENSE DT) Place 1 application onto teeth daily.  . Tamsulosin HCl (FLOMAX) 0.4 MG CAPS Take 0.4 mg by mouth 2 (two) times daily.   . traMADol (ULTRAM) 50 MG tablet Take 100 mg by mouth 2 (two) times daily. scheduled  . zolpidem (AMBIEN) 10 MG tablet Take 10 mg by mouth at bedtime as needed for sleep.    No facility-administered encounter  medications on file as of 09/09/2016.   : Review of Systems:  Out of a complete 14 point review of systems, all are reviewed and negative with the exception of these symptoms as listed below:  Review of Systems  Neurological: Positive for tremors.       Pt presents today to discuss his tremors in his hands. Pt also wants to discuss his spinal stenosis and shooting pains in his legs.    Objective:  Neurologic Exam  Physical Exam Physical Examination:   Vitals:   09/09/16 1448  BP: (!) 150/72  Pulse: 76  Resp: 20   General Examination: The patient is a very pleasant 83 y.o. male in no acute distress. He appears frail and deconditioned.  HEENT: Normocephalic, atraumatic, pupils are equal, round and reactive to light and accommodation. Status post cataract repair. Extraocular tracking is difficult throughout. He has no nystagmus. Funduscopic exam is okay. He has mild facial masking, neck is mildly rigid, I did not do a full range of motion passively due to a history of neck fracture. Hearing is intact to mildly impaired. He has mild decrease in eye blink rate. Speech is mildly hypophonic, not tremulous, no lip, neck or jaw tremor is noted. Oropharynx exam reveals mild mouth dryness, adequate dental hygiene, no significant airway crowding. Some redundancy in the soft palate is noted.  Chest: is clear to auscultation without wheezing, rhonchi or crackles noted.  Heart: sounds are irregular and pulse is irregular.  Abdomen: is soft, non-tender and non-distended with normal bowel sounds appreciated on auscultation.  Extremities: There is 1+ pitting edema in the distal lower extremities bilaterally. Pedal pulses are intact. Chronic stasis-like changes are noted in the distal legs bilaterally.   Skin: is warm and dry with no trophic changes noted. Casey-related changes are noted on the skin.   Musculoskeletal: exam reveals no obvious joint deformities, tenderness, joint swelling or  erythema.  Neurologically:  Mental status: The patient is awake and alert, paying good  attention. He is able to completely provide the history. His memory, attention, language and knowledge are appropriate. There is perhaps a mild degree of bradyphrenia.   Cranial nerves are as described above under HEENT exam. In addition,  shoulder shrug is normal with equal shoulder height noted.  Motor exam: Thin bulk, global strength of 4 out of 5. Reflexes are 1+ throughout. Reflexes are absent in the ankles. He has difficulty with fine motor skills in both upper extremities and both lower extremities, mildly worse on the left. Overall findings are mild. He has a mild resting tremor in the left upper extremity only, he has a very mild postural tremor, no action tremor.  Romberg is not testable safely. He stands with difficulty and has to at times, has to push himself up, posture is moderately stooped. He does not pick up his feet very well and walks with increase lumbar kyphosis. He turns insecurely, balance is impaired, he uses a 4 wheeled walker.  Cerebellar testing shows no dysmetria or intention tremor.   Sensory exam is intact to light touch, pinprick, vibration, temperature sense and proprioception in the upper and lower extremities, with the exception of decrease vibratory sense in the distal lower extremities.  Assessment and Plan:   In summary, [name] is a very pleasant 83 y.o.-year old male  with an underlying complex medical history of C1 arch fracture and C2 fracture in November 2015 after a fall, atrial fibrillation, scoliosis, spinal stenosis, macular degeneration, vitamin D deficiency, CHF, tachycardia bradycardia syndrome, status post pacemaker placement, status post multiple surgeries including shoulder surgeries, tonsillectomy, hip replacement surgeries, knee surgery, cataract repairs, trigger finger surgery on the right, pacemaker insertion in May 2015, who presents for initial evaluation  of his left upper extremity tremor and concern for parkinsonism. His history and physical exam are indeed in keeping with mild parkinsonism. He has overall mild findings, mild lateralization to the left. He is on multiple medications. I'm concerned about this and also the fact that he lives alone. He does have a call alert button. He does have friends within the neighborhood and the apartment management does check on the residence from what I understand. I suggested that we proceed with low-dose Sinemet therapy, starting with 25-100 milligrams strength generic, half a pill once daily for a week and then increase it to half a pill twice daily thereafter. I advised him that we are primarily looking for a reduction in his resting tremor. For his history of neck pain, low back pain, spinal stenosis he is advised to talk to you about a referral to a spine specialist. He does have a gait disorder which is most likely secondary to a combination of normal aging, scoliosis, spinal stenosis, not always hydrating well enough and mild parkinsonism. I suggested a 3 month follow-up with one of her nausea practitioner's to check how things are working out for him and to consider increasing the Sinemet at the time if tolerated. I did advise him to call us for any updates or concerns. I provided a new prescription written instructions for him. I answered all his questions today and he was in agreement. Of note, I asked him not to indulge in any alcoholic beverages at this time as his balance could be worsened by even one glass of alcohol. In addition, he is strongly advised to be very cautious with the use of Ambien or any sleep aid for fear of exacerbating balance problems and risk for falls. He indicates that he takes the Ambien very infrequently. Thank you very much for allowing me to participate in the care of this nice patient. If I can be of any further assistance to you please do not hesitate to call me at  [phone].  Sincerely,   Hector Age, MD, PhD

## 2016-10-02 DIAGNOSIS — I639 Cerebral infarction, unspecified: Secondary | ICD-10-CM | POA: Diagnosis not present

## 2016-10-02 DIAGNOSIS — E785 Hyperlipidemia, unspecified: Secondary | ICD-10-CM | POA: Diagnosis not present

## 2016-10-02 DIAGNOSIS — I119 Hypertensive heart disease without heart failure: Secondary | ICD-10-CM | POA: Diagnosis not present

## 2016-10-02 DIAGNOSIS — M1990 Unspecified osteoarthritis, unspecified site: Secondary | ICD-10-CM | POA: Diagnosis not present

## 2016-10-02 DIAGNOSIS — I495 Sick sinus syndrome: Secondary | ICD-10-CM | POA: Diagnosis not present

## 2016-10-02 DIAGNOSIS — R7309 Other abnormal glucose: Secondary | ICD-10-CM | POA: Diagnosis not present

## 2016-10-02 DIAGNOSIS — I482 Chronic atrial fibrillation: Secondary | ICD-10-CM | POA: Diagnosis not present

## 2016-10-02 DIAGNOSIS — M4800 Spinal stenosis, site unspecified: Secondary | ICD-10-CM | POA: Diagnosis not present

## 2016-10-02 DIAGNOSIS — R2681 Unsteadiness on feet: Secondary | ICD-10-CM | POA: Diagnosis not present

## 2016-10-16 ENCOUNTER — Ambulatory Visit (INDEPENDENT_AMBULATORY_CARE_PROVIDER_SITE_OTHER): Payer: Medicare Other | Admitting: *Deleted

## 2016-10-16 DIAGNOSIS — I495 Sick sinus syndrome: Secondary | ICD-10-CM | POA: Diagnosis not present

## 2016-10-16 DIAGNOSIS — M6281 Muscle weakness (generalized): Secondary | ICD-10-CM | POA: Diagnosis not present

## 2016-10-16 DIAGNOSIS — H3530 Unspecified macular degeneration: Secondary | ICD-10-CM | POA: Diagnosis not present

## 2016-10-16 DIAGNOSIS — I4891 Unspecified atrial fibrillation: Secondary | ICD-10-CM | POA: Diagnosis not present

## 2016-10-16 DIAGNOSIS — I110 Hypertensive heart disease with heart failure: Secondary | ICD-10-CM | POA: Diagnosis not present

## 2016-10-16 DIAGNOSIS — M419 Scoliosis, unspecified: Secondary | ICD-10-CM | POA: Diagnosis not present

## 2016-10-16 DIAGNOSIS — M4807 Spinal stenosis, lumbosacral region: Secondary | ICD-10-CM | POA: Diagnosis not present

## 2016-10-16 DIAGNOSIS — I2510 Atherosclerotic heart disease of native coronary artery without angina pectoris: Secondary | ICD-10-CM | POA: Diagnosis not present

## 2016-10-16 DIAGNOSIS — I509 Heart failure, unspecified: Secondary | ICD-10-CM | POA: Diagnosis not present

## 2016-10-16 DIAGNOSIS — M159 Polyosteoarthritis, unspecified: Secondary | ICD-10-CM | POA: Diagnosis not present

## 2016-10-16 DIAGNOSIS — G20 Parkinson's disease: Secondary | ICD-10-CM | POA: Diagnosis not present

## 2016-10-16 DIAGNOSIS — R498 Other voice and resonance disorders: Secondary | ICD-10-CM | POA: Diagnosis not present

## 2016-10-16 DIAGNOSIS — H918X1 Other specified hearing loss, right ear: Secondary | ICD-10-CM | POA: Diagnosis not present

## 2016-10-16 NOTE — Progress Notes (Signed)
Remote pacemaker transmission.   

## 2016-10-17 ENCOUNTER — Encounter: Payer: Self-pay | Admitting: Cardiology

## 2016-10-17 DIAGNOSIS — M4807 Spinal stenosis, lumbosacral region: Secondary | ICD-10-CM | POA: Diagnosis not present

## 2016-10-17 DIAGNOSIS — I110 Hypertensive heart disease with heart failure: Secondary | ICD-10-CM | POA: Diagnosis not present

## 2016-10-17 DIAGNOSIS — M159 Polyosteoarthritis, unspecified: Secondary | ICD-10-CM | POA: Diagnosis not present

## 2016-10-17 DIAGNOSIS — G20 Parkinson's disease: Secondary | ICD-10-CM | POA: Diagnosis not present

## 2016-10-17 DIAGNOSIS — M6281 Muscle weakness (generalized): Secondary | ICD-10-CM | POA: Diagnosis not present

## 2016-10-17 DIAGNOSIS — I509 Heart failure, unspecified: Secondary | ICD-10-CM | POA: Diagnosis not present

## 2016-10-21 DIAGNOSIS — M6281 Muscle weakness (generalized): Secondary | ICD-10-CM | POA: Diagnosis not present

## 2016-10-21 DIAGNOSIS — G20 Parkinson's disease: Secondary | ICD-10-CM | POA: Diagnosis not present

## 2016-10-21 DIAGNOSIS — M4807 Spinal stenosis, lumbosacral region: Secondary | ICD-10-CM | POA: Diagnosis not present

## 2016-10-21 DIAGNOSIS — M159 Polyosteoarthritis, unspecified: Secondary | ICD-10-CM | POA: Diagnosis not present

## 2016-10-21 DIAGNOSIS — I110 Hypertensive heart disease with heart failure: Secondary | ICD-10-CM | POA: Diagnosis not present

## 2016-10-21 DIAGNOSIS — I509 Heart failure, unspecified: Secondary | ICD-10-CM | POA: Diagnosis not present

## 2016-10-22 DIAGNOSIS — M159 Polyosteoarthritis, unspecified: Secondary | ICD-10-CM | POA: Diagnosis not present

## 2016-10-22 DIAGNOSIS — I110 Hypertensive heart disease with heart failure: Secondary | ICD-10-CM | POA: Diagnosis not present

## 2016-10-22 DIAGNOSIS — M6281 Muscle weakness (generalized): Secondary | ICD-10-CM | POA: Diagnosis not present

## 2016-10-22 DIAGNOSIS — I509 Heart failure, unspecified: Secondary | ICD-10-CM | POA: Diagnosis not present

## 2016-10-22 DIAGNOSIS — G20 Parkinson's disease: Secondary | ICD-10-CM | POA: Diagnosis not present

## 2016-10-22 DIAGNOSIS — M4807 Spinal stenosis, lumbosacral region: Secondary | ICD-10-CM | POA: Diagnosis not present

## 2016-10-23 DIAGNOSIS — M6281 Muscle weakness (generalized): Secondary | ICD-10-CM | POA: Diagnosis not present

## 2016-10-23 DIAGNOSIS — I110 Hypertensive heart disease with heart failure: Secondary | ICD-10-CM | POA: Diagnosis not present

## 2016-10-23 DIAGNOSIS — G20 Parkinson's disease: Secondary | ICD-10-CM | POA: Diagnosis not present

## 2016-10-23 DIAGNOSIS — I509 Heart failure, unspecified: Secondary | ICD-10-CM | POA: Diagnosis not present

## 2016-10-23 DIAGNOSIS — M159 Polyosteoarthritis, unspecified: Secondary | ICD-10-CM | POA: Diagnosis not present

## 2016-10-23 DIAGNOSIS — M4807 Spinal stenosis, lumbosacral region: Secondary | ICD-10-CM | POA: Diagnosis not present

## 2016-10-24 DIAGNOSIS — I110 Hypertensive heart disease with heart failure: Secondary | ICD-10-CM | POA: Diagnosis not present

## 2016-10-24 DIAGNOSIS — G20 Parkinson's disease: Secondary | ICD-10-CM | POA: Diagnosis not present

## 2016-10-24 DIAGNOSIS — M159 Polyosteoarthritis, unspecified: Secondary | ICD-10-CM | POA: Diagnosis not present

## 2016-10-24 DIAGNOSIS — M6281 Muscle weakness (generalized): Secondary | ICD-10-CM | POA: Diagnosis not present

## 2016-10-24 DIAGNOSIS — I509 Heart failure, unspecified: Secondary | ICD-10-CM | POA: Diagnosis not present

## 2016-10-24 DIAGNOSIS — M4807 Spinal stenosis, lumbosacral region: Secondary | ICD-10-CM | POA: Diagnosis not present

## 2016-10-27 LAB — CUP PACEART REMOTE DEVICE CHECK
Battery Remaining Longevity: 102 mo
Battery Remaining Percentage: 100 %
Brady Statistic RV Percent Paced: 64 %
Date Time Interrogation Session: 20180322044200
Implantable Lead Implant Date: 20000401
Implantable Lead Location: 753860
Implantable Lead Model: 4245
Implantable Lead Serial Number: 402472
Implantable Pulse Generator Implant Date: 20150505
Lead Channel Impedance Value: 441 Ohm
Lead Channel Pacing Threshold Amplitude: 0.8 V
Lead Channel Pacing Threshold Pulse Width: 0.4 ms
Lead Channel Setting Pacing Amplitude: 1.2 V
Lead Channel Setting Pacing Pulse Width: 0.4 ms
Lead Channel Setting Sensing Sensitivity: 2.5 mV
Pulse Gen Serial Number: 390662

## 2016-10-28 DIAGNOSIS — G20 Parkinson's disease: Secondary | ICD-10-CM | POA: Diagnosis not present

## 2016-10-28 DIAGNOSIS — M4807 Spinal stenosis, lumbosacral region: Secondary | ICD-10-CM | POA: Diagnosis not present

## 2016-10-28 DIAGNOSIS — I110 Hypertensive heart disease with heart failure: Secondary | ICD-10-CM | POA: Diagnosis not present

## 2016-10-28 DIAGNOSIS — I509 Heart failure, unspecified: Secondary | ICD-10-CM | POA: Diagnosis not present

## 2016-10-28 DIAGNOSIS — M6281 Muscle weakness (generalized): Secondary | ICD-10-CM | POA: Diagnosis not present

## 2016-10-28 DIAGNOSIS — M159 Polyosteoarthritis, unspecified: Secondary | ICD-10-CM | POA: Diagnosis not present

## 2016-10-29 DIAGNOSIS — M6281 Muscle weakness (generalized): Secondary | ICD-10-CM | POA: Diagnosis not present

## 2016-10-29 DIAGNOSIS — I509 Heart failure, unspecified: Secondary | ICD-10-CM | POA: Diagnosis not present

## 2016-10-29 DIAGNOSIS — G20 Parkinson's disease: Secondary | ICD-10-CM | POA: Diagnosis not present

## 2016-10-29 DIAGNOSIS — M4807 Spinal stenosis, lumbosacral region: Secondary | ICD-10-CM | POA: Diagnosis not present

## 2016-10-29 DIAGNOSIS — I110 Hypertensive heart disease with heart failure: Secondary | ICD-10-CM | POA: Diagnosis not present

## 2016-10-29 DIAGNOSIS — M159 Polyosteoarthritis, unspecified: Secondary | ICD-10-CM | POA: Diagnosis not present

## 2016-10-30 DIAGNOSIS — I110 Hypertensive heart disease with heart failure: Secondary | ICD-10-CM | POA: Diagnosis not present

## 2016-10-30 DIAGNOSIS — M159 Polyosteoarthritis, unspecified: Secondary | ICD-10-CM | POA: Diagnosis not present

## 2016-10-30 DIAGNOSIS — G20 Parkinson's disease: Secondary | ICD-10-CM | POA: Diagnosis not present

## 2016-10-30 DIAGNOSIS — I509 Heart failure, unspecified: Secondary | ICD-10-CM | POA: Diagnosis not present

## 2016-10-30 DIAGNOSIS — M6281 Muscle weakness (generalized): Secondary | ICD-10-CM | POA: Diagnosis not present

## 2016-10-30 DIAGNOSIS — M4807 Spinal stenosis, lumbosacral region: Secondary | ICD-10-CM | POA: Diagnosis not present

## 2016-10-31 DIAGNOSIS — I509 Heart failure, unspecified: Secondary | ICD-10-CM | POA: Diagnosis not present

## 2016-10-31 DIAGNOSIS — M4807 Spinal stenosis, lumbosacral region: Secondary | ICD-10-CM | POA: Diagnosis not present

## 2016-10-31 DIAGNOSIS — M159 Polyosteoarthritis, unspecified: Secondary | ICD-10-CM | POA: Diagnosis not present

## 2016-10-31 DIAGNOSIS — I110 Hypertensive heart disease with heart failure: Secondary | ICD-10-CM | POA: Diagnosis not present

## 2016-10-31 DIAGNOSIS — M6281 Muscle weakness (generalized): Secondary | ICD-10-CM | POA: Diagnosis not present

## 2016-10-31 DIAGNOSIS — G20 Parkinson's disease: Secondary | ICD-10-CM | POA: Diagnosis not present

## 2016-11-03 DIAGNOSIS — M4807 Spinal stenosis, lumbosacral region: Secondary | ICD-10-CM | POA: Diagnosis not present

## 2016-11-03 DIAGNOSIS — I509 Heart failure, unspecified: Secondary | ICD-10-CM | POA: Diagnosis not present

## 2016-11-03 DIAGNOSIS — I110 Hypertensive heart disease with heart failure: Secondary | ICD-10-CM | POA: Diagnosis not present

## 2016-11-03 DIAGNOSIS — M159 Polyosteoarthritis, unspecified: Secondary | ICD-10-CM | POA: Diagnosis not present

## 2016-11-03 DIAGNOSIS — M6281 Muscle weakness (generalized): Secondary | ICD-10-CM | POA: Diagnosis not present

## 2016-11-03 DIAGNOSIS — G20 Parkinson's disease: Secondary | ICD-10-CM | POA: Diagnosis not present

## 2016-11-04 DIAGNOSIS — G20 Parkinson's disease: Secondary | ICD-10-CM | POA: Diagnosis not present

## 2016-11-04 DIAGNOSIS — M159 Polyosteoarthritis, unspecified: Secondary | ICD-10-CM | POA: Diagnosis not present

## 2016-11-04 DIAGNOSIS — M6281 Muscle weakness (generalized): Secondary | ICD-10-CM | POA: Diagnosis not present

## 2016-11-04 DIAGNOSIS — I509 Heart failure, unspecified: Secondary | ICD-10-CM | POA: Diagnosis not present

## 2016-11-04 DIAGNOSIS — I110 Hypertensive heart disease with heart failure: Secondary | ICD-10-CM | POA: Diagnosis not present

## 2016-11-04 DIAGNOSIS — M4807 Spinal stenosis, lumbosacral region: Secondary | ICD-10-CM | POA: Diagnosis not present

## 2016-11-05 DIAGNOSIS — I110 Hypertensive heart disease with heart failure: Secondary | ICD-10-CM | POA: Diagnosis not present

## 2016-11-05 DIAGNOSIS — M159 Polyosteoarthritis, unspecified: Secondary | ICD-10-CM | POA: Diagnosis not present

## 2016-11-05 DIAGNOSIS — I509 Heart failure, unspecified: Secondary | ICD-10-CM | POA: Diagnosis not present

## 2016-11-05 DIAGNOSIS — G20 Parkinson's disease: Secondary | ICD-10-CM | POA: Diagnosis not present

## 2016-11-05 DIAGNOSIS — M6281 Muscle weakness (generalized): Secondary | ICD-10-CM | POA: Diagnosis not present

## 2016-11-05 DIAGNOSIS — M4807 Spinal stenosis, lumbosacral region: Secondary | ICD-10-CM | POA: Diagnosis not present

## 2016-11-06 DIAGNOSIS — I110 Hypertensive heart disease with heart failure: Secondary | ICD-10-CM | POA: Diagnosis not present

## 2016-11-06 DIAGNOSIS — M6281 Muscle weakness (generalized): Secondary | ICD-10-CM | POA: Diagnosis not present

## 2016-11-06 DIAGNOSIS — M159 Polyosteoarthritis, unspecified: Secondary | ICD-10-CM | POA: Diagnosis not present

## 2016-11-06 DIAGNOSIS — G20 Parkinson's disease: Secondary | ICD-10-CM | POA: Diagnosis not present

## 2016-11-06 DIAGNOSIS — M4807 Spinal stenosis, lumbosacral region: Secondary | ICD-10-CM | POA: Diagnosis not present

## 2016-11-06 DIAGNOSIS — I509 Heart failure, unspecified: Secondary | ICD-10-CM | POA: Diagnosis not present

## 2016-11-10 DIAGNOSIS — M6281 Muscle weakness (generalized): Secondary | ICD-10-CM | POA: Diagnosis not present

## 2016-11-10 DIAGNOSIS — I509 Heart failure, unspecified: Secondary | ICD-10-CM | POA: Diagnosis not present

## 2016-11-10 DIAGNOSIS — I110 Hypertensive heart disease with heart failure: Secondary | ICD-10-CM | POA: Diagnosis not present

## 2016-11-10 DIAGNOSIS — G20 Parkinson's disease: Secondary | ICD-10-CM | POA: Diagnosis not present

## 2016-11-10 DIAGNOSIS — M159 Polyosteoarthritis, unspecified: Secondary | ICD-10-CM | POA: Diagnosis not present

## 2016-11-10 DIAGNOSIS — M4807 Spinal stenosis, lumbosacral region: Secondary | ICD-10-CM | POA: Diagnosis not present

## 2016-11-12 DIAGNOSIS — M6281 Muscle weakness (generalized): Secondary | ICD-10-CM | POA: Diagnosis not present

## 2016-11-12 DIAGNOSIS — M159 Polyosteoarthritis, unspecified: Secondary | ICD-10-CM | POA: Diagnosis not present

## 2016-11-12 DIAGNOSIS — M4807 Spinal stenosis, lumbosacral region: Secondary | ICD-10-CM | POA: Diagnosis not present

## 2016-11-12 DIAGNOSIS — G20 Parkinson's disease: Secondary | ICD-10-CM | POA: Diagnosis not present

## 2016-11-12 DIAGNOSIS — I509 Heart failure, unspecified: Secondary | ICD-10-CM | POA: Diagnosis not present

## 2016-11-12 DIAGNOSIS — I110 Hypertensive heart disease with heart failure: Secondary | ICD-10-CM | POA: Diagnosis not present

## 2016-11-13 DIAGNOSIS — I509 Heart failure, unspecified: Secondary | ICD-10-CM | POA: Diagnosis not present

## 2016-11-13 DIAGNOSIS — G20 Parkinson's disease: Secondary | ICD-10-CM | POA: Diagnosis not present

## 2016-11-13 DIAGNOSIS — M159 Polyosteoarthritis, unspecified: Secondary | ICD-10-CM | POA: Diagnosis not present

## 2016-11-13 DIAGNOSIS — M4807 Spinal stenosis, lumbosacral region: Secondary | ICD-10-CM | POA: Diagnosis not present

## 2016-11-13 DIAGNOSIS — I110 Hypertensive heart disease with heart failure: Secondary | ICD-10-CM | POA: Diagnosis not present

## 2016-11-13 DIAGNOSIS — M6281 Muscle weakness (generalized): Secondary | ICD-10-CM | POA: Diagnosis not present

## 2016-11-14 DIAGNOSIS — M6281 Muscle weakness (generalized): Secondary | ICD-10-CM | POA: Diagnosis not present

## 2016-11-14 DIAGNOSIS — M159 Polyosteoarthritis, unspecified: Secondary | ICD-10-CM | POA: Diagnosis not present

## 2016-11-14 DIAGNOSIS — M4807 Spinal stenosis, lumbosacral region: Secondary | ICD-10-CM | POA: Diagnosis not present

## 2016-11-14 DIAGNOSIS — I110 Hypertensive heart disease with heart failure: Secondary | ICD-10-CM | POA: Diagnosis not present

## 2016-11-14 DIAGNOSIS — I509 Heart failure, unspecified: Secondary | ICD-10-CM | POA: Diagnosis not present

## 2016-11-14 DIAGNOSIS — G20 Parkinson's disease: Secondary | ICD-10-CM | POA: Diagnosis not present

## 2016-11-17 DIAGNOSIS — M159 Polyosteoarthritis, unspecified: Secondary | ICD-10-CM | POA: Diagnosis not present

## 2016-11-17 DIAGNOSIS — I509 Heart failure, unspecified: Secondary | ICD-10-CM | POA: Diagnosis not present

## 2016-11-17 DIAGNOSIS — I110 Hypertensive heart disease with heart failure: Secondary | ICD-10-CM | POA: Diagnosis not present

## 2016-11-17 DIAGNOSIS — M6281 Muscle weakness (generalized): Secondary | ICD-10-CM | POA: Diagnosis not present

## 2016-11-17 DIAGNOSIS — G20 Parkinson's disease: Secondary | ICD-10-CM | POA: Diagnosis not present

## 2016-11-17 DIAGNOSIS — M4807 Spinal stenosis, lumbosacral region: Secondary | ICD-10-CM | POA: Diagnosis not present

## 2016-11-19 DIAGNOSIS — I509 Heart failure, unspecified: Secondary | ICD-10-CM | POA: Diagnosis not present

## 2016-11-19 DIAGNOSIS — M6281 Muscle weakness (generalized): Secondary | ICD-10-CM | POA: Diagnosis not present

## 2016-11-19 DIAGNOSIS — M4807 Spinal stenosis, lumbosacral region: Secondary | ICD-10-CM | POA: Diagnosis not present

## 2016-11-19 DIAGNOSIS — I110 Hypertensive heart disease with heart failure: Secondary | ICD-10-CM | POA: Diagnosis not present

## 2016-11-19 DIAGNOSIS — G20 Parkinson's disease: Secondary | ICD-10-CM | POA: Diagnosis not present

## 2016-11-19 DIAGNOSIS — M159 Polyosteoarthritis, unspecified: Secondary | ICD-10-CM | POA: Diagnosis not present

## 2016-11-21 DIAGNOSIS — G20 Parkinson's disease: Secondary | ICD-10-CM | POA: Diagnosis not present

## 2016-11-21 DIAGNOSIS — M4807 Spinal stenosis, lumbosacral region: Secondary | ICD-10-CM | POA: Diagnosis not present

## 2016-11-21 DIAGNOSIS — M6281 Muscle weakness (generalized): Secondary | ICD-10-CM | POA: Diagnosis not present

## 2016-11-21 DIAGNOSIS — I509 Heart failure, unspecified: Secondary | ICD-10-CM | POA: Diagnosis not present

## 2016-11-21 DIAGNOSIS — I110 Hypertensive heart disease with heart failure: Secondary | ICD-10-CM | POA: Diagnosis not present

## 2016-11-21 DIAGNOSIS — M159 Polyosteoarthritis, unspecified: Secondary | ICD-10-CM | POA: Diagnosis not present

## 2016-11-24 DIAGNOSIS — M6281 Muscle weakness (generalized): Secondary | ICD-10-CM | POA: Diagnosis not present

## 2016-11-24 DIAGNOSIS — M159 Polyosteoarthritis, unspecified: Secondary | ICD-10-CM | POA: Diagnosis not present

## 2016-11-24 DIAGNOSIS — G20 Parkinson's disease: Secondary | ICD-10-CM | POA: Diagnosis not present

## 2016-11-24 DIAGNOSIS — I509 Heart failure, unspecified: Secondary | ICD-10-CM | POA: Diagnosis not present

## 2016-11-24 DIAGNOSIS — I110 Hypertensive heart disease with heart failure: Secondary | ICD-10-CM | POA: Diagnosis not present

## 2016-11-24 DIAGNOSIS — M4807 Spinal stenosis, lumbosacral region: Secondary | ICD-10-CM | POA: Diagnosis not present

## 2016-11-26 DIAGNOSIS — I509 Heart failure, unspecified: Secondary | ICD-10-CM | POA: Diagnosis not present

## 2016-11-26 DIAGNOSIS — M4807 Spinal stenosis, lumbosacral region: Secondary | ICD-10-CM | POA: Diagnosis not present

## 2016-11-26 DIAGNOSIS — G20 Parkinson's disease: Secondary | ICD-10-CM | POA: Diagnosis not present

## 2016-11-26 DIAGNOSIS — I110 Hypertensive heart disease with heart failure: Secondary | ICD-10-CM | POA: Diagnosis not present

## 2016-11-26 DIAGNOSIS — M6281 Muscle weakness (generalized): Secondary | ICD-10-CM | POA: Diagnosis not present

## 2016-11-26 DIAGNOSIS — M159 Polyosteoarthritis, unspecified: Secondary | ICD-10-CM | POA: Diagnosis not present

## 2016-11-27 ENCOUNTER — Emergency Department (HOSPITAL_COMMUNITY)
Admission: EM | Admit: 2016-11-27 | Discharge: 2016-11-27 | Disposition: A | Discharge status: Home / Self Care | Payer: Medicare Other | Attending: Emergency Medicine | Admitting: Emergency Medicine

## 2016-11-27 ENCOUNTER — Encounter (HOSPITAL_COMMUNITY): Payer: Self-pay | Admitting: Emergency Medicine

## 2016-11-27 DIAGNOSIS — I509 Heart failure, unspecified: Secondary | ICD-10-CM | POA: Diagnosis not present

## 2016-11-27 DIAGNOSIS — Z87891 Personal history of nicotine dependence: Secondary | ICD-10-CM | POA: Insufficient documentation

## 2016-11-27 DIAGNOSIS — R600 Localized edema: Secondary | ICD-10-CM | POA: Diagnosis not present

## 2016-11-27 DIAGNOSIS — Z950 Presence of cardiac pacemaker: Secondary | ICD-10-CM | POA: Insufficient documentation

## 2016-11-27 DIAGNOSIS — R001 Bradycardia, unspecified: Secondary | ICD-10-CM | POA: Diagnosis not present

## 2016-11-27 DIAGNOSIS — G20 Parkinson's disease: Secondary | ICD-10-CM | POA: Diagnosis not present

## 2016-11-27 DIAGNOSIS — M6281 Muscle weakness (generalized): Secondary | ICD-10-CM | POA: Diagnosis not present

## 2016-11-27 DIAGNOSIS — I959 Hypotension, unspecified: Secondary | ICD-10-CM | POA: Diagnosis not present

## 2016-11-27 DIAGNOSIS — M159 Polyosteoarthritis, unspecified: Secondary | ICD-10-CM | POA: Diagnosis not present

## 2016-11-27 DIAGNOSIS — I110 Hypertensive heart disease with heart failure: Secondary | ICD-10-CM | POA: Diagnosis not present

## 2016-11-27 DIAGNOSIS — R031 Nonspecific low blood-pressure reading: Secondary | ICD-10-CM | POA: Diagnosis not present

## 2016-11-27 DIAGNOSIS — Z7901 Long term (current) use of anticoagulants: Secondary | ICD-10-CM | POA: Insufficient documentation

## 2016-11-27 DIAGNOSIS — M4807 Spinal stenosis, lumbosacral region: Secondary | ICD-10-CM | POA: Diagnosis not present

## 2016-11-27 LAB — BASIC METABOLIC PANEL
Anion gap: 8 (ref 5–15)
BUN: 33 mg/dL — ABNORMAL HIGH (ref 6–20)
CO2: 22 mmol/L (ref 22–32)
Calcium: 8.5 mg/dL — ABNORMAL LOW (ref 8.9–10.3)
Chloride: 110 mmol/L (ref 101–111)
Creatinine, Ser: 1.21 mg/dL (ref 0.61–1.24)
GFR calc Af Amer: 60 mL/min — ABNORMAL LOW (ref >60)
GFR calc non Af Amer: 52 mL/min — ABNORMAL LOW (ref >60)
Glucose, Bld: 137 mg/dL — ABNORMAL HIGH (ref 65–99)
Potassium: 3.9 mmol/L (ref 3.5–5.1)
Sodium: 140 mmol/L (ref 135–145)

## 2016-11-27 LAB — CBC WITH DIFFERENTIAL/PLATELET
Basophils Absolute: 0 10*3/uL (ref 0.0–0.1)
Basophils Relative: 0 %
Eosinophils Absolute: 0.1 10*3/uL (ref 0.0–0.7)
Eosinophils Relative: 3 %
HCT: 34.6 % — ABNORMAL LOW (ref 39.0–52.0)
Hemoglobin: 11.1 g/dL — ABNORMAL LOW (ref 13.0–17.0)
Lymphocytes Relative: 24 %
Lymphs Abs: 1.1 10*3/uL (ref 0.7–4.0)
MCH: 30.7 pg (ref 26.0–34.0)
MCHC: 32.1 g/dL (ref 30.0–36.0)
MCV: 95.6 fL (ref 78.0–100.0)
Monocytes Absolute: 0.3 10*3/uL (ref 0.1–1.0)
Monocytes Relative: 6 %
Neutro Abs: 3 10*3/uL (ref 1.7–7.7)
Neutrophils Relative %: 67 %
Platelets: 128 10*3/uL — ABNORMAL LOW (ref 150–400)
RBC: 3.62 MIL/uL — ABNORMAL LOW (ref 4.22–5.81)
RDW: 14.8 % (ref 11.5–15.5)
WBC: 4.5 10*3/uL (ref 4.0–10.5)

## 2016-11-27 LAB — URINALYSIS, ROUTINE W REFLEX MICROSCOPIC
Bilirubin Urine: NEGATIVE
Glucose, UA: NEGATIVE mg/dL
Hgb urine dipstick: NEGATIVE
Ketones, ur: NEGATIVE mg/dL
Leukocytes, UA: NEGATIVE
Nitrite: NEGATIVE
Protein, ur: NEGATIVE mg/dL
Specific Gravity, Urine: 1.015 (ref 1.005–1.030)
pH: 7 (ref 5.0–8.0)

## 2016-11-27 LAB — I-STAT TROPONIN, ED: Troponin i, poc: 0.07 ng/mL (ref 0.00–0.08)

## 2016-11-27 MED ORDER — SODIUM CHLORIDE 0.9 % IV BOLUS (SEPSIS)
1000.0000 mL | Freq: Once | INTRAVENOUS | Status: AC
  Administered 2016-11-27: 1000 mL via INTRAVENOUS

## 2016-11-27 NOTE — ED Provider Notes (Signed)
[facility] DEPT Provider Note   CSN: [id number] Arrival date & time: 11/27/16  1706     History   Chief Complaint Chief Complaint  Patient presents with  . Hypotension    HPI [name] is a 83 y.o. male.  The history is provided by the patient and the EMS personnel.  Illness  This is a new problem. The current episode started yesterday. The problem occurs daily. The problem has been resolved. Pertinent negatives include no chest pain, no abdominal pain, no headaches and no shortness of breath. Nothing aggravates the symptoms. Nothing relieves the symptoms. He has tried nothing for the symptoms.    Past Medical History:  Diagnosis Date  . Arthritis    incl spinal stenosis  . Atrial fibrillation ([place])   . CHF (congestive heart failure) ([place])   . HTN (hypertension)   . Renal insufficiency    question degree  . Wears glasses    reading  . Wears partial dentures    upper partial    Patient Active Problem List   Diagnosis Date Noted  . CHF (congestive heart failure) ([place])   . Hypertension 09/17/2012  . Pneumonia 12/03/2011  . Syncope 10/13/2011  . ATRIAL FIBRILLATION 02/19/2010  . RENAL INSUFFICIENCY 02/19/2010  . PACEMAKER, single [employer] 02/19/2010    Past Surgical History:  Procedure Laterality Date  . bilateral shoulder surgery    . EYE SURGERY     both catararcts  . I&D EXTREMITY  12/07/2011   Procedure: IRRIGATION AND DEBRIDEMENT EXTREMITY;  Surgeon: Tennis Must, MD;  Location: [facility];  Service: Orthopedics;  Laterality: Left;  . PACEMAKER GENERATOR CHANGE N/A 11/28/2013   Procedure: PACEMAKER GENERATOR CHANGE;  Surgeon: Deboraha Sprang, MD;  Location: [facility];  Service: Cardiovascular;  Laterality: N/A;  . PACEMAKER INSERTION     BSX pacemaker generator change 11-28-2013 by Dr Caryl Comes  . prosthetic hip - bilaterally    . R knee quadricep tendon repair    . TONSILLECTOMY    . TRIGGER FINGER RELEASE Right 05/26/2013   Procedure:  RIGHT SMALL TRIGGER RELEASE ;  Surgeon: Tennis Must, MD;  Location: [facility];  Service: Orthopedics;  Laterality: Right;       Home Medications    Prior to Admission medications   Medication Sig Start Date End Date Taking? Authorizing Provider  acetaminophen (TYLENOL) 500 MG tablet Take 1,000 mg by mouth 2 (two) times daily. scheduled    Historical Provider, MD  amoxicillin (AMOXIL) 500 MG capsule Take 500 mg by mouth See admin instructions. Take 4 capsules (2000 mg) by mouth prior to dental appointment    Historical Provider, MD  apixaban (ELIQUIS) 5 MG TABS tablet Take 5 mg by mouth 2 (two) times daily.    Historical Provider, MD  Ascorbic Acid (VITAMIN C) 1000 MG tablet Take 1,000 mg by mouth daily.    Historical Provider, MD  b complex vitamins tablet Take 1 tablet by mouth daily.    Historical Provider, MD  beta carotene w/minerals (OCUVITE) tablet Take 1 tablet by mouth 2 (two) times daily.    Historical Provider, MD  bisacodyl (DULCOLAX) 5 MG EC tablet Take 10 mg by mouth daily as needed for moderate constipation.    Historical Provider, MD  CALCIUM CITRATE PO Take 1 tablet by mouth daily.     Historical Provider, MD  carbidopa-levodopa (SINEMET IR) 25-100 MG tablet 1/2 pill once daily for 1 week, then 1/2 pill 2 times a  day. 09/09/16   Star Age, MD  carvedilol (COREG) 3.125 MG tablet Take 3.125-6.25 mg by mouth 2 (two) times daily as needed (high blood pressure). If blood pressure under 100 do not take any of this medication, between 100 and 140 take 1 tablet (3.125 mg), over 140 take 2 tablets (6.25 mg) 02/11/11   Deboraha Sprang, MD  C[place] 450 MG CAPS Take 450 mg by mouth daily as needed (constipation).    Historical Provider, MD  Cholecalciferol (VITAMIN D-3) 5000 UNITS TABS Take 5,000 Units by mouth daily.    Historical Provider, MD  cyclobenzaprine (FLEXERIL) 10 MG tablet Take 1 tablet (10 mg total) by mouth 2 (two) times daily as needed for muscle  spasms. 06/18/14   Kaitlyn Szekalski, [name]  finasteride (PROSCAR) 5 MG tablet Take 5 mg by mouth daily.     Historical Provider, MD  furosemide (LASIX) 20 MG tablet Take 20 mg by mouth daily.    Historical Provider, MD  HYDROcodone-acetaminophen (NORCO/VICODIN) 5-325 MG per tablet Take 1-2 tablets by mouth every 4 (four) hours as needed for moderate pain or severe pain. 06/18/14   Kaitlyn Szekalski, [name]  Mouthwashes (BIOTENE DRY MOUTH MT) Use as directed 10 mLs in the mouth or throat daily as needed (dry mouth).    Historical Provider, MD  Multiple Vitamin (MULITIVITAMIN WITH MINERALS) TABS Take 1 tablet by mouth daily.     Historical Provider, MD  Omega-3 Fatty Acids (FISH OIL) 1200 MG CAPS Take 2,400 mg by mouth 2 (two) times daily.    Historical Provider, MD  oxybutynin (DITROPAN-XL) 5 MG 24 hr tablet Take 5 mg by mouth at bedtime.    Historical Provider, MD  polyvinyl alcohol (LIQUIFILM TEARS) 1.4 % ophthalmic solution Place 1 drop into both eyes 5 (five) times daily as needed for dry eyes.     Historical Provider, MD  pravastatin (PRAVACHOL) 40 MG tablet Take 40 mg by mouth at bedtime.    Historical Provider, MD  Sodium Fluoride (FLUORIDEX DAILY DEFENSE DT) Place 1 application onto teeth daily.    Historical Provider, MD  Tamsulosin HCl (FLOMAX) 0.4 MG CAPS Take 0.4 mg by mouth 2 (two) times daily.     Historical Provider, MD  traMADol (ULTRAM) 50 MG tablet Take 100 mg by mouth 2 (two) times daily. scheduled    Historical Provider, MD  zolpidem (AMBIEN) 10 MG tablet Take 10 mg by mouth at bedtime as needed for sleep.     Historical Provider, MD    Family History No family history on file.  Social History Social History  Substance Use Topics  . Smoking status: Former Smoker    Types: Cigarettes    Quit date: 05/13/1971  . Smokeless tobacco: Never Used  . Alcohol use Yes     Comment: occasional     Allergies   Amiodarone   Review of Systems Review of Systems  Constitutional:  Negative for chills and fever.  HENT: Negative for ear pain and sore throat.   Eyes: Negative for pain and visual disturbance.  Respiratory: Negative for cough and shortness of breath.   Cardiovascular: Negative for chest pain and palpitations.  Gastrointestinal: Negative for abdominal pain and vomiting.  Genitourinary: Negative for dysuria and hematuria.  Musculoskeletal: Positive for back pain (chronic). Negative for arthralgias.  Skin: Negative for color change and rash.  Neurological: Positive for weakness (generalized). Negative for seizures, syncope and headaches.  All other systems reviewed and are negative.    Physical Exam  Updated Vital Signs BP 129/65   Pulse 60   Temp 98.1 F (36.7 C) (Oral)   Resp (!) 24   Ht 5\' 8"  (1.727 m)   Wt 70.4 kg   SpO2 100%   BMI 23.61 kg/m   Physical Exam  Constitutional: He appears well-developed and well-nourished.  HENT:  Head: Normocephalic and atraumatic.  Eyes: Conjunctivae are normal.  Neck: Neck supple.  Cardiovascular: Normal rate and regular rhythm.   No murmur heard. Pulmonary/Chest: Effort normal and breath sounds normal. No respiratory distress.  Abdominal: Soft. There is no tenderness.  Musculoskeletal: He exhibits edema (2+ in left foot).  Neurological: He is alert.  Skin: Skin is warm and dry.  Bandaged chronic wound on left ankle w/o sings of surrounding infection  Psychiatric: He has a normal mood and affect.  Nursing note and vitals reviewed.    ED Treatments / Results  Labs (all labs ordered are listed, but only abnormal results are displayed) Labs Reviewed  BASIC METABOLIC PANEL - Abnormal; Notable for the following:       Result Value   Glucose, Bld 137 (*)    BUN 33 (*)    Calcium 8.5 (*)    GFR calc non Af Amer 52 (*)    GFR calc Af Amer 60 (*)    All other components within normal limits  CBC WITH DIFFERENTIAL/PLATELET - Abnormal; Notable for the following:    RBC 3.62 (*)    Hemoglobin 11.1  (*)    HCT 34.6 (*)    Platelets 128 (*)    All other components within normal limits  URINALYSIS, ROUTINE W REFLEX MICROSCOPIC  I-STAT TROPOININ, ED    EKG  EKG Interpretation  Date/Time:  Thursday Nov 27 2016 20:31:09 EDT Ventricular Rate:  60 PR Interval:    QRS Duration: 152 QT Interval:  474 QTC Calculation: 474 R Axis:   97 Text Interpretation:  Atrial fibrillation Ventricular premature complex Nonspecific intraventricular conduction delay Probable anteroseptal infarct, recent Lateral leads are also involved No significant change since last tracing Confirmed by Winfred Leeds  MD, SAM [phone]) on 11/27/2016 8:44:14 PM       Radiology No results found.  Procedures Procedures (including critical care time)  Medications Ordered in ED Medications  sodium chloride 0.9 % bolus 1,000 mL (0 mLs Intravenous Stopped 11/27/16 2206)    Initial Impression / Assessment and Plan / ED Course  I have reviewed the triage vital signs and the nursing notes.  Pertinent labs & imaging results that were available during my care of the patient were reviewed by me and considered in my medical decision making (see chart for details).    Pt presents from home after being told his BP was low. Says he felt a few hours of generalized weakness today, but otherwise was in his normal state of health. He was working on his computer when the home health nurse came in & as part of her assessment she took his BP; EMS said she reported his pressure to be 80s/40s, but he was normotensive when they arrived without any intervention. Pt asymptomatic w/SBP in the 130s on exam.  VS & exam as above. NS bolus ordered. EKG: paced rhythm @ 60bpm w/o signs of ischemia. Labs unremarkable. UA w/o signs of infection.  Pt remained asymptomatic during his observation in the ED. He ate dinner and wanted to go home.   Used a walker in the ED and felt normal the entire time; no change in VS.  Cause of the Pt's symptoms unclear,  but doubt emergent etiology given history, exam, and work up. Feel he is safe for d/c home w/close follow up.  Explained all results to the Pt. Will discharge the Pt home. Recommending follow-up with PCP. ED return precautions provided. Pt acknowledged understanding of, and concurrence with the plan. All questions answered to his satisfaction. In stable condition at the time of discharge.  Final Clinical Impressions(s) / ED Diagnoses   Final diagnoses:  Hypotension, unspecified hypotension type    New Prescriptions New Prescriptions   No medications on file     Jenny Reichmann, MD 11/28/16 [place], MD 11/29/16 [phone]

## 2016-11-27 NOTE — ED Triage Notes (Signed)
EMS st's they were called by home health ref. Pt's blood pressure being low. Pt is being seen by home health for a wound on left ankle.  On EMS arrival pt's b/p was normal.  Pt st's he has pain in low back but this is normal for him.  No other complaints voiced by pt.  Pt alert and oriented x's 3.

## 2016-11-27 NOTE — ED Provider Notes (Signed)
Patient's blood pressure noted to be low today by home health nurse. EMS arrived and patient's blood pressure was normal. He is presently asymptomatic he does report generalized weakness for several hours earlier today. He ate a full meal in the emergency department. On exam chronically ill-appearing no distress lungs clear auscultation heart regular rate and rhythm abdomen soft nontender   Orlie Dakin, MD 11/27/16 2357

## 2016-11-27 NOTE — ED Notes (Signed)
Dinner tray ordered for pt

## 2016-12-01 ENCOUNTER — Other Ambulatory Visit: Payer: Self-pay | Admitting: Internal Medicine

## 2016-12-01 ENCOUNTER — Ambulatory Visit
Admission: RE | Admit: 2016-12-01 | Discharge: 2016-12-01 | Disposition: A | Discharge status: Home / Self Care | Payer: Medicare Other | Source: Ambulatory Visit | Attending: Internal Medicine | Admitting: Internal Medicine

## 2016-12-01 DIAGNOSIS — M79672 Pain in left foot: Secondary | ICD-10-CM

## 2016-12-01 DIAGNOSIS — E559 Vitamin D deficiency, unspecified: Secondary | ICD-10-CM | POA: Diagnosis not present

## 2016-12-01 DIAGNOSIS — M7989 Other specified soft tissue disorders: Secondary | ICD-10-CM | POA: Diagnosis not present

## 2016-12-01 DIAGNOSIS — K5900 Constipation, unspecified: Secondary | ICD-10-CM | POA: Diagnosis not present

## 2016-12-01 DIAGNOSIS — M48062 Spinal stenosis, lumbar region with neurogenic claudication: Secondary | ICD-10-CM | POA: Diagnosis not present

## 2016-12-01 DIAGNOSIS — I4891 Unspecified atrial fibrillation: Secondary | ICD-10-CM | POA: Diagnosis not present

## 2016-12-01 DIAGNOSIS — H918X1 Other specified hearing loss, right ear: Secondary | ICD-10-CM | POA: Diagnosis not present

## 2016-12-01 DIAGNOSIS — M25572 Pain in left ankle and joints of left foot: Secondary | ICD-10-CM

## 2016-12-01 DIAGNOSIS — G20 Parkinson's disease: Secondary | ICD-10-CM | POA: Diagnosis not present

## 2016-12-01 DIAGNOSIS — M419 Scoliosis, unspecified: Secondary | ICD-10-CM | POA: Diagnosis not present

## 2016-12-01 DIAGNOSIS — I509 Heart failure, unspecified: Secondary | ICD-10-CM | POA: Diagnosis not present

## 2016-12-01 DIAGNOSIS — R269 Unspecified abnormalities of gait and mobility: Secondary | ICD-10-CM | POA: Diagnosis not present

## 2016-12-01 DIAGNOSIS — L97929 Non-pressure chronic ulcer of unspecified part of left lower leg with unspecified severity: Secondary | ICD-10-CM | POA: Diagnosis not present

## 2016-12-01 DIAGNOSIS — S91002A Unspecified open wound, left ankle, initial encounter: Secondary | ICD-10-CM | POA: Diagnosis not present

## 2016-12-01 DIAGNOSIS — Z1389 Encounter for screening for other disorder: Secondary | ICD-10-CM | POA: Diagnosis not present

## 2016-12-03 DIAGNOSIS — G20 Parkinson's disease: Secondary | ICD-10-CM | POA: Diagnosis not present

## 2016-12-03 DIAGNOSIS — M159 Polyosteoarthritis, unspecified: Secondary | ICD-10-CM | POA: Diagnosis not present

## 2016-12-03 DIAGNOSIS — M6281 Muscle weakness (generalized): Secondary | ICD-10-CM | POA: Diagnosis not present

## 2016-12-03 DIAGNOSIS — M4807 Spinal stenosis, lumbosacral region: Secondary | ICD-10-CM | POA: Diagnosis not present

## 2016-12-03 DIAGNOSIS — I509 Heart failure, unspecified: Secondary | ICD-10-CM | POA: Diagnosis not present

## 2016-12-03 DIAGNOSIS — I110 Hypertensive heart disease with heart failure: Secondary | ICD-10-CM | POA: Diagnosis not present

## 2016-12-04 DIAGNOSIS — D225 Melanocytic nevi of trunk: Secondary | ICD-10-CM | POA: Diagnosis not present

## 2016-12-04 DIAGNOSIS — L814 Other melanin hyperpigmentation: Secondary | ICD-10-CM | POA: Diagnosis not present

## 2016-12-04 DIAGNOSIS — L570 Actinic keratosis: Secondary | ICD-10-CM | POA: Diagnosis not present

## 2016-12-04 DIAGNOSIS — L821 Other seborrheic keratosis: Secondary | ICD-10-CM | POA: Diagnosis not present

## 2016-12-04 DIAGNOSIS — L820 Inflamed seborrheic keratosis: Secondary | ICD-10-CM | POA: Diagnosis not present

## 2016-12-08 DIAGNOSIS — G20 Parkinson's disease: Secondary | ICD-10-CM | POA: Diagnosis not present

## 2016-12-08 DIAGNOSIS — I509 Heart failure, unspecified: Secondary | ICD-10-CM | POA: Diagnosis not present

## 2016-12-08 DIAGNOSIS — M6281 Muscle weakness (generalized): Secondary | ICD-10-CM | POA: Diagnosis not present

## 2016-12-08 DIAGNOSIS — M4807 Spinal stenosis, lumbosacral region: Secondary | ICD-10-CM | POA: Diagnosis not present

## 2016-12-08 DIAGNOSIS — I110 Hypertensive heart disease with heart failure: Secondary | ICD-10-CM | POA: Diagnosis not present

## 2016-12-08 DIAGNOSIS — M159 Polyosteoarthritis, unspecified: Secondary | ICD-10-CM | POA: Diagnosis not present

## 2016-12-09 ENCOUNTER — Ambulatory Visit (INDEPENDENT_AMBULATORY_CARE_PROVIDER_SITE_OTHER): Payer: Medicare Other | Admitting: Adult Health

## 2016-12-09 ENCOUNTER — Encounter: Payer: Self-pay | Admitting: Adult Health

## 2016-12-09 VITALS — BP 169/81 | HR 67 | Ht 68.0 in | Wt 169.6 lb

## 2016-12-09 DIAGNOSIS — G20 Parkinson's disease: Secondary | ICD-10-CM | POA: Diagnosis not present

## 2016-12-09 DIAGNOSIS — R269 Unspecified abnormalities of gait and mobility: Secondary | ICD-10-CM | POA: Diagnosis not present

## 2016-12-09 DIAGNOSIS — G20A1 Parkinson's disease without dyskinesia, without mention of fluctuations: Secondary | ICD-10-CM

## 2016-12-09 MED ORDER — CARBIDOPA-LEVODOPA 25-100 MG PO TABS: 0.5000 | ORAL_TABLET | Freq: Three times a day (TID) | ORAL | 5 refills | Status: DC

## 2016-12-09 NOTE — Patient Instructions (Signed)
Increase Sinemet to 1/2 tablet three times a day If your symptoms worsen or you develop new symptoms please let us know.  Carbidopa; Levodopa tablets What is this medicine? CARBIDOPA;LEVODOPA (kar bi DOE pa; lee voe DOE pa) is used to treat the symptoms of Parkinson's disease. This medicine may be used for other purposes; ask your health care provider or pharmacist if you have questions. COMMON BRAND NAME(S): Atamet, SINEMET What should I tell my health care provider before I take this medicine? They need to know if you have any of these conditions: -asthma or lung disease -depression or other mental illness -diabetes -glaucoma -heart disease, including history of a heart attack -irregular heart beat -kidney or liver disease -melanoma or suspicious skin lesions -stomach or intestine ulcers -an unusual or allergic reaction to levodopa, carbidopa, other medicines, foods, dyes, or preservatives -pregnant or trying to get pregnant -breast-feeding How should I use this medicine? Take this medicine by mouth with a glass of water. Follow the directions on the prescription label. Take your doses at regular intervals. Do not take your medicine more often than directed. Do not stop taking except on the advice of your doctor or health care professional. Talk to your pediatrician regarding the use of this medicine in children. Special care may be needed. Overdosage: If you think you have taken too much of this medicine contact a poison control center or emergency room at once. NOTE: This medicine is only for you. Do not share this medicine with others. What if I miss a dose? If you miss a dose, take it as soon as you can. If it is almost time for your next dose, take only that dose. Do not take double or extra doses. What may interact with this medicine? Do not take this medicine with any of the following medications: -MAOIs like Marplan, Nardil, and Parnate -reserpine -tetrabenazine This  medicine may also interact with the following medications: -alcohol -droperidol -entacapone -iron supplements or multivitamins with iron -isoniazid, INH -linezolid -medicines for depression, anxiety, or psychotic disturbances -medicines for high blood pressure -medicines for sleep -metoclopramide -papaverine -procarbazine -tedizolid -rasagiline -selegiline -tolcapone This list may not describe all possible interactions. Give your health care provider a list of all the medicines, herbs, non-prescription drugs, or dietary supplements you use. Also tell them if you smoke, drink alcohol, or use illegal drugs. Some items may interact with your medicine. What should I watch for while using this medicine? Visit your doctor or health care professional for regular checks on your progress. It may be several weeks or months before you feel the full benefits of this medicine. Continue to take your medicine on a regular schedule. Do not take any additional medicines for Parkinson's disease without first consulting with your health care provider. You may experience a wearing of effect prior to the time for your next dose of this medicine. You may also experience an on-off effect where the medicine apparently stops working for anything from a minute to several hours, then suddenly starts working again. Tell your doctor or health care professional if any of these symptoms happen to you. Your dose may need to be changed. A high protein diet can slow or prevent absorption of this medicine. Avoid high protein foods near the time of taking this medicine to help to prevent these problems. Take this medicine at least 30 minutes before eating or one hour after meals. You may want to eat higher protein foods later in the day or in small amounts.  Discuss your diet with your doctor or health care professional or nutritionist. You may get drowsy or dizzy. Do not drive, use machinery, or do anything that needs mental  alertness until you know how this drug affects you. Do not stand or sit up quickly, especially if you are an older patient. This reduces the risk of dizzy or fainting spells. Alcohol can make you more drowsy and dizzy. Avoid alcoholic drinks. If you find that you have sudden feelings of wanting to sleep during normal activities, like cooking, watching television, or while driving or riding in a car, you should contact your health care professional. If you are diabetic, this medicine may interfere with the accuracy of some tests for sugar or ketones in the urine (does not interfere with blood tests). Check with your doctor or health care professional before changing the dose of your diabetic medicine. This medicine may discolor the urine or sweat, making it look darker or red in color. This is of no cause for concern. However, this may stain clothing or fabrics. There have been reports of increased sexual urges or other strong urges such as gambling while taking some medicines for Parkinson's disease. If you experience any of these urges while taking this medicine, you should report it to your health care provider as soon as possible. You should check your skin often for changes to moles and new growths while taking this medicine. Call your doctor if you notice any of these changes. What side effects may I notice from receiving this medicine? Side effects that you should report to your doctor or health care professional as soon as possible: -allergic reactions like skin rash, itching or hives, swelling of the face, lips, or tongue -anxiety, confusion, or nervousness -falling asleep during normal activities like driving -fast, irregular heartbeat -hallucination, loss of contact with reality -mood changes like aggressive behavior, depression -stomach pain -trouble passing urine -uncontrolled movements of the mouth, head, hands, feet, shoulders, eyelids or other unusual muscle movements Side effects  that usually do not require medical attention (report to your doctor or health care professional if they continue or are bothersome): -headache -loss of appetite -muscle twitches -nausea, vomiting -nightmares, trouble sleeping -unusually weak or tired This list may not describe all possible side effects. Call your doctor for medical advice about side effects. You may report side effects to FDA at 1-800-FDA-1088. Where should I keep my medicine? Keep out of the reach of children. Store at room temperature between 15 and 30 degrees C (59 and 86 degrees F). Protect from light. Throw away any unused medicine after the expiration date. NOTE: This sheet is a summary. It may not cover all possible information. If you have questions about this medicine, talk to your doctor, pharmacist, or health care provider.  2018 Elsevier/Gold Standard (2013-09-13 15:41:53)

## 2016-12-09 NOTE — Progress Notes (Addendum)
PATIENT: [name] DOB: [birth year]  REASON FOR VISIT: follow up-  parkinsonism HISTORY FROM: patient  HISTORY OF PRESENT ILLNESS: [name] is an 83 year old male with a history of mild parkinsonism. He returns today for follow-up. He was started on Sinemet 25-100 milligrams half a tablet twice a day. He feels that this has been beneficial for his tremor. He denies any side effects. Denies any significant changes with his gait or balance. He continues to use a Rollator when ambulating. He does report that he had one fall since his visit in February. He states that he tripped over the wheel of his Rollator. Fortunately he did not suffer any injuries but he was unable to get up off the floor. His apartment complex  called EMS and they assisted him. Denies any trouble chewing or swallowing. Reports a good appetite. He has ongoing trouble falling asleep and staying asleep. He states that most nights he gets 3-5 hours of sleep. He denies any new neurological symptoms. He returns today for an evaluation.  HISTORY 09/09/16 Copied from Dr. Guadelupe Sabin notes:  saw your patient, [name], upon your kind request in my neurologic clinic today for initial consultation of his tremor affecting his left upper extremity and concern for parkinsonism. The patient is unaccompanied today, came via [facility] bus. Of note, he missed an appointment on 08/21/2016. As you know, [name] is an 83 year old right-handed gentleman with an underlying complex medical history of C1 arch fracture and C2 fracture in November 2015 after a fall, atrial fibrillation, scoliosis, spinal stenosis, macular degeneration, vitamin D deficiency, CHF, tachycardia bradycardia syndrome, status post pacemaker placement, status post multiple surgeries including shoulder surgeries, tonsillectomy, hip replacement surgeries, knee surgery, cataract repairs, trigger finger surgery on the right, pacemaker insertion in May 2015, who has been noted to have a  left upper extremity resting tremor and you have also noted facial masking, some signs and symptoms of parkinsonism. I reviewed your office note from 07/15/2016, which you kindly included. He had blood work at your office including CBC with differential which was unremarkable with the exception of borderline MCV at 95.4, CMP was unremarkable with the exception of glucose at 129, TSH was normal, B12 was normal, vitamin D was low normal at 37.8, urinalysis negative.  Of note, he had a head CT without contrast on 03/16/2016 after a fall and I reviewed the results: IMPRESSION: Chronic changes as described, similar to priors. No acute intracranial abnormality. No posttraumatic sequelae are seen in this patient on Eliquis.  In addition, I personally reviewed the images through the PACS system and agree with the results, exvacuo dilatation is noted. Global atrophy is noted.  He had a CT cervical spine without contrast on 06/16/2014 which I reviewed: IMPRESSION:  Fractures involving the C1 anterior arch and the C2 left lateral mass.  He has noted a left arm and hand tremor for the past year or 2. He has a history of degenerative neck disease and lumbar spinal stenosis, had seen Dr. Nelva Bush for this and had injections to his lower back which worked for the first time and then stop working for the second and third time as I understand. He had seen a spine specialist before and is supposed to see another spine specialist from what I understand. He lives alone in an apartment. He lives on the third floor and has [employer] an elevator. He walks with a walker, has been using a walker for the past 5 years or so. He  is widowed. He has no children. He moved from [place] in 2011. He does not recall any family history of tremor or Parkinson's disease. He has noted some difficulty with walking and fine motor control and has had a long standing issue with balance and has had some falls. He denies any significant  memory or mood issues. He sees cardiology regularly. He quit smoking over 40 years ago, drinks alcohol infrequently, maybe once a month or so a glass of wine typically. He does not always drink enough water.   REVIEW OF SYSTEMS: Out of a complete 14 system review of symptoms, the patient complains only of the following symptoms, and all other reviewed systems are negative.  See history of present illness  ALLERGIES: Allergies  Allergen Reactions  . Amiodarone Other (See Comments)     fluid on lungs    HOME MEDICATIONS: Outpatient Medications Prior to Visit  Medication Sig Dispense Refill  . acetaminophen (TYLENOL) 500 MG tablet Take 1,000 mg by mouth 2 (two) times daily. scheduled    . amoxicillin (AMOXIL) 500 MG capsule Take 500 mg by mouth See admin instructions. Take 4 capsules (2000 mg) by mouth prior to dental appointment    . apixaban (ELIQUIS) 5 MG TABS tablet Take 5 mg by mouth 2 (two) times daily.    . Ascorbic Acid (VITAMIN C) 1000 MG tablet Take 1,000 mg by mouth daily.    Marland Kitchen b complex vitamins tablet Take 1 tablet by mouth daily.    . beta carotene w/minerals (OCUVITE) tablet Take 1 tablet by mouth 2 (two) times daily.    . bisacodyl (DULCOLAX) 5 MG EC tablet Take 10 mg by mouth daily as needed for moderate constipation.    Marland Kitchen CALCIUM CITRATE PO Take 1 tablet by mouth daily.     . carbidopa-levodopa (SINEMET IR) 25-100 MG tablet 1/2 pill once daily for 1 week, then 1/2 pill 2 times a day. 30 tablet 5  . carvedilol (COREG) 3.125 MG tablet Take 3.125-6.25 mg by mouth 2 (two) times daily as needed (high blood pressure). If blood pressure under 100 do not take any of this medication, between 100 and 140 take 1 tablet (3.125 mg), over 140 take 2 tablets (6.25 mg)    . Cascara Sagrada 450 MG CAPS Take 450 mg by mouth daily as needed (constipation).    . Cholecalciferol (VITAMIN D-3) 5000 UNITS TABS Take 5,000 Units by mouth daily.    . cyclobenzaprine (FLEXERIL) 10 MG tablet Take 1  tablet (10 mg total) by mouth 2 (two) times daily as needed for muscle spasms. 20 tablet 0  . finasteride (PROSCAR) 5 MG tablet Take 5 mg by mouth daily.     . furosemide (LASIX) 20 MG tablet Take 20 mg by mouth daily.    Marland Kitchen HYDROcodone-acetaminophen (NORCO/VICODIN) 5-325 MG per tablet Take 1-2 tablets by mouth every 4 (four) hours as needed for moderate pain or severe pain. 15 tablet 0  . Mouthwashes (BIOTENE DRY MOUTH MT) Use as directed 10 mLs in the mouth or throat daily as needed (dry mouth).    . Multiple Vitamin (MULITIVITAMIN WITH MINERALS) TABS Take 1 tablet by mouth daily.     . Omega-3 Fatty Acids (FISH OIL) 1200 MG CAPS Take 2,400 mg by mouth 2 (two) times daily.    Marland Kitchen oxybutynin (DITROPAN-XL) 5 MG 24 hr tablet Take 5 mg by mouth at bedtime.    . polyvinyl alcohol (LIQUIFILM TEARS) 1.4 % ophthalmic solution Place 1  drop into both eyes 5 (five) times daily as needed for dry eyes.     . pravastatin (PRAVACHOL) 40 MG tablet Take 40 mg by mouth at bedtime.    . Sodium Fluoride (FLUORIDEX DAILY DEFENSE DT) Place 1 application onto teeth daily.    . Tamsulosin HCl (FLOMAX) 0.4 MG CAPS Take 0.4 mg by mouth 2 (two) times daily.     . traMADol (ULTRAM) 50 MG tablet Take 100 mg by mouth 2 (two) times daily. scheduled    . zolpidem (AMBIEN) 10 MG tablet Take 10 mg by mouth at bedtime as needed for sleep.      No facility-administered medications prior to visit.     PAST MEDICAL HISTORY: Past Medical History:  Diagnosis Date  . Arthritis    incl spinal stenosis  . Atrial fibrillation ([place])   . CHF (congestive heart failure) ([place])   . HTN (hypertension)   . Renal insufficiency    question degree  . Wears glasses    reading  . Wears partial dentures    upper partial    PAST SURGICAL HISTORY: Past Surgical History:  Procedure Laterality Date  . bilateral shoulder surgery    . EYE SURGERY     both catararcts  . I&D EXTREMITY  12/07/2011   Procedure: IRRIGATION AND DEBRIDEMENT  EXTREMITY;  Surgeon: Tennis Must, MD;  Location: [facility];  Service: Orthopedics;  Laterality: Left;  . PACEMAKER GENERATOR CHANGE N/A 11/28/2013   Procedure: PACEMAKER GENERATOR CHANGE;  Surgeon: Deboraha Sprang, MD;  Location[facility] CATH LAB;  Service: Cardiovascular;  Laterality: N/A;  . PACEMAKER INSERTION     BSX pacemaker generator change 11-28-2013 by Dr Caryl Comes  . prosthetic hip - bilaterally    . R knee quadricep tendon repair    . TONSILLECTOMY    . TRIGGER FINGER RELEASE Right 05/26/2013   Procedure: RIGHT SMALL TRIGGER RELEASE ;  Surgeon: Tennis Must, MD;  Location: [facility];  Service: Orthopedics;  Laterality: Right;    FAMILY HISTORY: No family history on file.  SOCIAL HISTORY: Social History   Social History  . Marital status: Widowed    Spouse name: N/A  . Number of children: N/A  . Years of education: N/A   Occupational History  . Not on file.   Social History Main Topics  . Smoking status: Former Smoker    Types: Cigarettes    Quit date: 05/13/1971  . Smokeless tobacco: Never Used  . Alcohol use Yes     Comment: occasional  . Drug use: No  . Sexual activity: Not on file   Other Topics Concern  . Not on file   Social History Narrative  . No narrative on file      PHYSICAL EXAM  Vitals:   12/09/16 1516  BP: (!) 169/81  Pulse: 67  Weight: 169 lb 9.6 oz (76.9 kg)  Height: 5\' 8"  (1.727 m)   Body mass index is 25.79 kg/m.  Generalized: Well developed, in no acute distress   Neurological examination  Mentation: Alert oriented to time, place, history taking. Follows all commands speech and language fluent Cranial nerve II-XII: Pupils were equal round reactive to light. Extraocular movements were full, visual field were full on confrontational test. Facial sensation and strength were normal. Uvula tongue midline. Head turning and shoulder shrug  were normal and symmetric. Motor: The motor testing reveals 5 over 5 strength of all 4  extremities. Resting tremor in LUE.  Sensory: Sensory  testing is intact to soft touch on all 4 extremities. No evidence of extinction is noted.  Coordination: Cerebellar testing reveals good finger-nose-finger and heel-to-shin bilaterally.  Gait and station: Patient requires assistance with standing. He uses a Radiation protection practitioner. The patient has a steppage type gait. Tandem gait not attended. Romberg also not attempted.  Reflexes: Deep tendon reflexes are symmetric and normal bilaterally.   DIAGNOSTIC DATA (LABS, IMAGING, TESTING) - I reviewed patient records, labs, notes, testing and imaging myself where available.  Lab Results  Component Value Date   WBC 4.5 11/27/2016   HGB 11.1 (L) 11/27/2016   HCT 34.6 (L) 11/27/2016   MCV 95.6 11/27/2016   PLT 128 (L) 11/27/2016      Component Value Date/Time   NA 140 11/27/2016 2030   K 3.9 11/27/2016 2030   CL 110 11/27/2016 2030   CO2 22 11/27/2016 2030   GLUCOSE 137 (H) 11/27/2016 2030   BUN 33 (H) 11/27/2016 2030   CREATININE 1.21 11/27/2016 2030   CALCIUM 8.5 (L) 11/27/2016 2030   PROT 6.6 12/03/2011 2040   ALBUMIN 2.9 (L) 12/03/2011 2040   AST 35 12/03/2011 2040   ALT 19 12/03/2011 2040   ALKPHOS 62 12/03/2011 2040   BILITOT 0.6 12/03/2011 2040   GFRNONAA 52 (L) 11/27/2016 2030   GFRAA 60 (L) 11/27/2016 2030   Lab Results  Component Value Date   CHOL 152 10/14/2011   HDL 54 10/14/2011   LDLCALC 78 10/14/2011   TRIG 98 10/14/2011   CHOLHDL 2.8 10/14/2011    Lab Results  Component Value Date   TSH 1.813 12/03/2011      ASSESSMENT AND PLAN 83 y.o. year old male  has a past medical history of Arthritis; Atrial fibrillation ([place]); CHF (congestive heart failure) ([place]); HTN (hypertension); Renal insufficiency; Wears glasses; and Wears partial dentures. here with:  1. Primary parkinsonism  2. Abnormality of gait  Overall the patient has tolerated Sinemet very well. We will increase Sinemet to half a tablet 3 times a day. I have  reviewed side effects of Sinemet with the patient. He is encouraged to exercise caution when ambulating. I advised the patient that if his symptoms worsen or he develops new symptoms he should let us know. He will follow-up in 6 months or sooner if needed.     Ward Givens, MSN, NP-C 12/09/2016, 3:20 PM [facility] [address] [place], [place] [address] [phone]  I reviewed the above note and documentation by the Nurse Practitioner and agree with the history, physical exam, assessment and plan as outlined above. I was immediately available for face-to-face consultation. Star Age, MD, PhD [facility])

## 2016-12-10 DIAGNOSIS — M159 Polyosteoarthritis, unspecified: Secondary | ICD-10-CM | POA: Diagnosis not present

## 2016-12-10 DIAGNOSIS — M4807 Spinal stenosis, lumbosacral region: Secondary | ICD-10-CM | POA: Diagnosis not present

## 2016-12-10 DIAGNOSIS — I509 Heart failure, unspecified: Secondary | ICD-10-CM | POA: Diagnosis not present

## 2016-12-10 DIAGNOSIS — G20 Parkinson's disease: Secondary | ICD-10-CM | POA: Diagnosis not present

## 2016-12-10 DIAGNOSIS — M6281 Muscle weakness (generalized): Secondary | ICD-10-CM | POA: Diagnosis not present

## 2016-12-10 DIAGNOSIS — I110 Hypertensive heart disease with heart failure: Secondary | ICD-10-CM | POA: Diagnosis not present

## 2016-12-11 DIAGNOSIS — M48062 Spinal stenosis, lumbar region with neurogenic claudication: Secondary | ICD-10-CM | POA: Diagnosis not present

## 2016-12-11 DIAGNOSIS — I509 Heart failure, unspecified: Secondary | ICD-10-CM | POA: Diagnosis not present

## 2016-12-11 DIAGNOSIS — M6281 Muscle weakness (generalized): Secondary | ICD-10-CM | POA: Diagnosis not present

## 2016-12-11 DIAGNOSIS — G20 Parkinson's disease: Secondary | ICD-10-CM | POA: Diagnosis not present

## 2016-12-11 DIAGNOSIS — R600 Localized edema: Secondary | ICD-10-CM | POA: Diagnosis not present

## 2016-12-11 DIAGNOSIS — I110 Hypertensive heart disease with heart failure: Secondary | ICD-10-CM | POA: Diagnosis not present

## 2016-12-11 DIAGNOSIS — M4807 Spinal stenosis, lumbosacral region: Secondary | ICD-10-CM | POA: Diagnosis not present

## 2016-12-11 DIAGNOSIS — M79672 Pain in left foot: Secondary | ICD-10-CM | POA: Diagnosis not present

## 2016-12-11 DIAGNOSIS — L97929 Non-pressure chronic ulcer of unspecified part of left lower leg with unspecified severity: Secondary | ICD-10-CM | POA: Diagnosis not present

## 2016-12-11 DIAGNOSIS — M159 Polyosteoarthritis, unspecified: Secondary | ICD-10-CM | POA: Diagnosis not present

## 2016-12-15 DIAGNOSIS — I110 Hypertensive heart disease with heart failure: Secondary | ICD-10-CM | POA: Diagnosis not present

## 2016-12-15 DIAGNOSIS — H3530 Unspecified macular degeneration: Secondary | ICD-10-CM | POA: Diagnosis not present

## 2016-12-15 DIAGNOSIS — I2510 Atherosclerotic heart disease of native coronary artery without angina pectoris: Secondary | ICD-10-CM | POA: Diagnosis not present

## 2016-12-15 DIAGNOSIS — I4891 Unspecified atrial fibrillation: Secondary | ICD-10-CM | POA: Diagnosis not present

## 2016-12-15 DIAGNOSIS — M419 Scoliosis, unspecified: Secondary | ICD-10-CM | POA: Diagnosis not present

## 2016-12-15 DIAGNOSIS — M4807 Spinal stenosis, lumbosacral region: Secondary | ICD-10-CM | POA: Diagnosis not present

## 2016-12-15 DIAGNOSIS — S81812A Laceration without foreign body, left lower leg, initial encounter: Secondary | ICD-10-CM | POA: Diagnosis not present

## 2016-12-15 DIAGNOSIS — G20 Parkinson's disease: Secondary | ICD-10-CM | POA: Diagnosis not present

## 2016-12-15 DIAGNOSIS — I509 Heart failure, unspecified: Secondary | ICD-10-CM | POA: Diagnosis not present

## 2016-12-15 DIAGNOSIS — M6281 Muscle weakness (generalized): Secondary | ICD-10-CM | POA: Diagnosis not present

## 2016-12-15 DIAGNOSIS — M159 Polyosteoarthritis, unspecified: Secondary | ICD-10-CM | POA: Diagnosis not present

## 2016-12-15 DIAGNOSIS — H918X1 Other specified hearing loss, right ear: Secondary | ICD-10-CM | POA: Diagnosis not present

## 2016-12-16 DIAGNOSIS — G20 Parkinson's disease: Secondary | ICD-10-CM | POA: Diagnosis not present

## 2016-12-16 DIAGNOSIS — M6281 Muscle weakness (generalized): Secondary | ICD-10-CM | POA: Diagnosis not present

## 2016-12-16 DIAGNOSIS — M4807 Spinal stenosis, lumbosacral region: Secondary | ICD-10-CM | POA: Diagnosis not present

## 2016-12-16 DIAGNOSIS — I110 Hypertensive heart disease with heart failure: Secondary | ICD-10-CM | POA: Diagnosis not present

## 2016-12-16 DIAGNOSIS — S81812A Laceration without foreign body, left lower leg, initial encounter: Secondary | ICD-10-CM | POA: Diagnosis not present

## 2016-12-16 DIAGNOSIS — M159 Polyosteoarthritis, unspecified: Secondary | ICD-10-CM | POA: Diagnosis not present

## 2016-12-19 DIAGNOSIS — I110 Hypertensive heart disease with heart failure: Secondary | ICD-10-CM | POA: Diagnosis not present

## 2016-12-19 DIAGNOSIS — S81812A Laceration without foreign body, left lower leg, initial encounter: Secondary | ICD-10-CM | POA: Diagnosis not present

## 2016-12-19 DIAGNOSIS — M4807 Spinal stenosis, lumbosacral region: Secondary | ICD-10-CM | POA: Diagnosis not present

## 2016-12-19 DIAGNOSIS — G20 Parkinson's disease: Secondary | ICD-10-CM | POA: Diagnosis not present

## 2016-12-19 DIAGNOSIS — M6281 Muscle weakness (generalized): Secondary | ICD-10-CM | POA: Diagnosis not present

## 2016-12-19 DIAGNOSIS — M159 Polyosteoarthritis, unspecified: Secondary | ICD-10-CM | POA: Diagnosis not present

## 2016-12-23 DIAGNOSIS — I110 Hypertensive heart disease with heart failure: Secondary | ICD-10-CM | POA: Diagnosis not present

## 2016-12-23 DIAGNOSIS — M6281 Muscle weakness (generalized): Secondary | ICD-10-CM | POA: Diagnosis not present

## 2016-12-23 DIAGNOSIS — S81812A Laceration without foreign body, left lower leg, initial encounter: Secondary | ICD-10-CM | POA: Diagnosis not present

## 2016-12-23 DIAGNOSIS — M4807 Spinal stenosis, lumbosacral region: Secondary | ICD-10-CM | POA: Diagnosis not present

## 2016-12-23 DIAGNOSIS — M159 Polyosteoarthritis, unspecified: Secondary | ICD-10-CM | POA: Diagnosis not present

## 2016-12-23 DIAGNOSIS — G20 Parkinson's disease: Secondary | ICD-10-CM | POA: Diagnosis not present

## 2016-12-25 DIAGNOSIS — M4807 Spinal stenosis, lumbosacral region: Secondary | ICD-10-CM | POA: Diagnosis not present

## 2016-12-25 DIAGNOSIS — M159 Polyosteoarthritis, unspecified: Secondary | ICD-10-CM | POA: Diagnosis not present

## 2016-12-25 DIAGNOSIS — M6281 Muscle weakness (generalized): Secondary | ICD-10-CM | POA: Diagnosis not present

## 2016-12-25 DIAGNOSIS — I110 Hypertensive heart disease with heart failure: Secondary | ICD-10-CM | POA: Diagnosis not present

## 2016-12-25 DIAGNOSIS — S81812A Laceration without foreign body, left lower leg, initial encounter: Secondary | ICD-10-CM | POA: Diagnosis not present

## 2016-12-25 DIAGNOSIS — G20 Parkinson's disease: Secondary | ICD-10-CM | POA: Diagnosis not present

## 2016-12-26 DIAGNOSIS — M6281 Muscle weakness (generalized): Secondary | ICD-10-CM | POA: Diagnosis not present

## 2016-12-26 DIAGNOSIS — G20 Parkinson's disease: Secondary | ICD-10-CM | POA: Diagnosis not present

## 2016-12-26 DIAGNOSIS — S81812A Laceration without foreign body, left lower leg, initial encounter: Secondary | ICD-10-CM | POA: Diagnosis not present

## 2016-12-26 DIAGNOSIS — M159 Polyosteoarthritis, unspecified: Secondary | ICD-10-CM | POA: Diagnosis not present

## 2016-12-26 DIAGNOSIS — I110 Hypertensive heart disease with heart failure: Secondary | ICD-10-CM | POA: Diagnosis not present

## 2016-12-26 DIAGNOSIS — M4807 Spinal stenosis, lumbosacral region: Secondary | ICD-10-CM | POA: Diagnosis not present

## 2016-12-29 DIAGNOSIS — M159 Polyosteoarthritis, unspecified: Secondary | ICD-10-CM | POA: Diagnosis not present

## 2016-12-29 DIAGNOSIS — S81812A Laceration without foreign body, left lower leg, initial encounter: Secondary | ICD-10-CM | POA: Diagnosis not present

## 2016-12-29 DIAGNOSIS — M6281 Muscle weakness (generalized): Secondary | ICD-10-CM | POA: Diagnosis not present

## 2016-12-29 DIAGNOSIS — M4807 Spinal stenosis, lumbosacral region: Secondary | ICD-10-CM | POA: Diagnosis not present

## 2016-12-29 DIAGNOSIS — G20 Parkinson's disease: Secondary | ICD-10-CM | POA: Diagnosis not present

## 2016-12-29 DIAGNOSIS — I110 Hypertensive heart disease with heart failure: Secondary | ICD-10-CM | POA: Diagnosis not present

## 2016-12-30 DIAGNOSIS — M6281 Muscle weakness (generalized): Secondary | ICD-10-CM | POA: Diagnosis not present

## 2016-12-30 DIAGNOSIS — M159 Polyosteoarthritis, unspecified: Secondary | ICD-10-CM | POA: Diagnosis not present

## 2016-12-30 DIAGNOSIS — S81812A Laceration without foreign body, left lower leg, initial encounter: Secondary | ICD-10-CM | POA: Diagnosis not present

## 2016-12-30 DIAGNOSIS — I110 Hypertensive heart disease with heart failure: Secondary | ICD-10-CM | POA: Diagnosis not present

## 2016-12-30 DIAGNOSIS — G20 Parkinson's disease: Secondary | ICD-10-CM | POA: Diagnosis not present

## 2016-12-30 DIAGNOSIS — M4807 Spinal stenosis, lumbosacral region: Secondary | ICD-10-CM | POA: Diagnosis not present

## 2017-01-01 DIAGNOSIS — S81812A Laceration without foreign body, left lower leg, initial encounter: Secondary | ICD-10-CM | POA: Diagnosis not present

## 2017-01-01 DIAGNOSIS — I110 Hypertensive heart disease with heart failure: Secondary | ICD-10-CM | POA: Diagnosis not present

## 2017-01-01 DIAGNOSIS — M159 Polyosteoarthritis, unspecified: Secondary | ICD-10-CM | POA: Diagnosis not present

## 2017-01-01 DIAGNOSIS — G20 Parkinson's disease: Secondary | ICD-10-CM | POA: Diagnosis not present

## 2017-01-01 DIAGNOSIS — M6281 Muscle weakness (generalized): Secondary | ICD-10-CM | POA: Diagnosis not present

## 2017-01-01 DIAGNOSIS — M4807 Spinal stenosis, lumbosacral region: Secondary | ICD-10-CM | POA: Diagnosis not present

## 2017-01-05 DIAGNOSIS — M159 Polyosteoarthritis, unspecified: Secondary | ICD-10-CM | POA: Diagnosis not present

## 2017-01-05 DIAGNOSIS — G20 Parkinson's disease: Secondary | ICD-10-CM | POA: Diagnosis not present

## 2017-01-05 DIAGNOSIS — M6281 Muscle weakness (generalized): Secondary | ICD-10-CM | POA: Diagnosis not present

## 2017-01-05 DIAGNOSIS — M4807 Spinal stenosis, lumbosacral region: Secondary | ICD-10-CM | POA: Diagnosis not present

## 2017-01-05 DIAGNOSIS — S81812A Laceration without foreign body, left lower leg, initial encounter: Secondary | ICD-10-CM | POA: Diagnosis not present

## 2017-01-05 DIAGNOSIS — I110 Hypertensive heart disease with heart failure: Secondary | ICD-10-CM | POA: Diagnosis not present

## 2017-01-06 DIAGNOSIS — M6281 Muscle weakness (generalized): Secondary | ICD-10-CM | POA: Diagnosis not present

## 2017-01-06 DIAGNOSIS — S81812A Laceration without foreign body, left lower leg, initial encounter: Secondary | ICD-10-CM | POA: Diagnosis not present

## 2017-01-06 DIAGNOSIS — I110 Hypertensive heart disease with heart failure: Secondary | ICD-10-CM | POA: Diagnosis not present

## 2017-01-06 DIAGNOSIS — M4807 Spinal stenosis, lumbosacral region: Secondary | ICD-10-CM | POA: Diagnosis not present

## 2017-01-06 DIAGNOSIS — M159 Polyosteoarthritis, unspecified: Secondary | ICD-10-CM | POA: Diagnosis not present

## 2017-01-06 DIAGNOSIS — G20 Parkinson's disease: Secondary | ICD-10-CM | POA: Diagnosis not present

## 2017-01-13 DIAGNOSIS — R600 Localized edema: Secondary | ICD-10-CM | POA: Diagnosis not present

## 2017-01-13 DIAGNOSIS — M4807 Spinal stenosis, lumbosacral region: Secondary | ICD-10-CM | POA: Diagnosis not present

## 2017-01-13 DIAGNOSIS — M159 Polyosteoarthritis, unspecified: Secondary | ICD-10-CM | POA: Diagnosis not present

## 2017-01-13 DIAGNOSIS — M6281 Muscle weakness (generalized): Secondary | ICD-10-CM | POA: Diagnosis not present

## 2017-01-13 DIAGNOSIS — G20 Parkinson's disease: Secondary | ICD-10-CM | POA: Diagnosis not present

## 2017-01-13 DIAGNOSIS — I110 Hypertensive heart disease with heart failure: Secondary | ICD-10-CM | POA: Diagnosis not present

## 2017-01-13 DIAGNOSIS — S81812A Laceration without foreign body, left lower leg, initial encounter: Secondary | ICD-10-CM | POA: Diagnosis not present

## 2017-01-13 DIAGNOSIS — M7989 Other specified soft tissue disorders: Secondary | ICD-10-CM | POA: Diagnosis not present

## 2017-01-14 DIAGNOSIS — G20 Parkinson's disease: Secondary | ICD-10-CM | POA: Diagnosis not present

## 2017-01-14 DIAGNOSIS — S81812A Laceration without foreign body, left lower leg, initial encounter: Secondary | ICD-10-CM | POA: Diagnosis not present

## 2017-01-14 DIAGNOSIS — M159 Polyosteoarthritis, unspecified: Secondary | ICD-10-CM | POA: Diagnosis not present

## 2017-01-14 DIAGNOSIS — M4807 Spinal stenosis, lumbosacral region: Secondary | ICD-10-CM | POA: Diagnosis not present

## 2017-01-14 DIAGNOSIS — I110 Hypertensive heart disease with heart failure: Secondary | ICD-10-CM | POA: Diagnosis not present

## 2017-01-14 DIAGNOSIS — M6281 Muscle weakness (generalized): Secondary | ICD-10-CM | POA: Diagnosis not present

## 2017-01-15 ENCOUNTER — Ambulatory Visit (INDEPENDENT_AMBULATORY_CARE_PROVIDER_SITE_OTHER): Payer: Medicare Other | Admitting: *Deleted

## 2017-01-15 DIAGNOSIS — I495 Sick sinus syndrome: Secondary | ICD-10-CM

## 2017-01-15 NOTE — Progress Notes (Signed)
Remote pacemaker transmission.   

## 2017-01-16 ENCOUNTER — Encounter: Payer: Self-pay | Admitting: Cardiology

## 2017-01-16 DIAGNOSIS — G20 Parkinson's disease: Secondary | ICD-10-CM | POA: Diagnosis not present

## 2017-01-16 DIAGNOSIS — R2681 Unsteadiness on feet: Secondary | ICD-10-CM | POA: Diagnosis not present

## 2017-01-16 DIAGNOSIS — I482 Chronic atrial fibrillation: Secondary | ICD-10-CM | POA: Diagnosis not present

## 2017-01-16 DIAGNOSIS — I495 Sick sinus syndrome: Secondary | ICD-10-CM | POA: Diagnosis not present

## 2017-01-16 DIAGNOSIS — E785 Hyperlipidemia, unspecified: Secondary | ICD-10-CM | POA: Diagnosis not present

## 2017-01-16 DIAGNOSIS — I119 Hypertensive heart disease without heart failure: Secondary | ICD-10-CM | POA: Diagnosis not present

## 2017-01-16 DIAGNOSIS — I639 Cerebral infarction, unspecified: Secondary | ICD-10-CM | POA: Diagnosis not present

## 2017-01-16 DIAGNOSIS — Z950 Presence of cardiac pacemaker: Secondary | ICD-10-CM | POA: Diagnosis not present

## 2017-01-16 DIAGNOSIS — R7309 Other abnormal glucose: Secondary | ICD-10-CM | POA: Diagnosis not present

## 2017-01-19 DIAGNOSIS — M4807 Spinal stenosis, lumbosacral region: Secondary | ICD-10-CM | POA: Diagnosis not present

## 2017-01-19 DIAGNOSIS — S81812A Laceration without foreign body, left lower leg, initial encounter: Secondary | ICD-10-CM | POA: Diagnosis not present

## 2017-01-19 DIAGNOSIS — I110 Hypertensive heart disease with heart failure: Secondary | ICD-10-CM | POA: Diagnosis not present

## 2017-01-19 DIAGNOSIS — G20 Parkinson's disease: Secondary | ICD-10-CM | POA: Diagnosis not present

## 2017-01-19 DIAGNOSIS — M6281 Muscle weakness (generalized): Secondary | ICD-10-CM | POA: Diagnosis not present

## 2017-01-19 DIAGNOSIS — M159 Polyosteoarthritis, unspecified: Secondary | ICD-10-CM | POA: Diagnosis not present

## 2017-01-20 DIAGNOSIS — S81812A Laceration without foreign body, left lower leg, initial encounter: Secondary | ICD-10-CM | POA: Diagnosis not present

## 2017-01-20 DIAGNOSIS — M4807 Spinal stenosis, lumbosacral region: Secondary | ICD-10-CM | POA: Diagnosis not present

## 2017-01-20 DIAGNOSIS — G20 Parkinson's disease: Secondary | ICD-10-CM | POA: Diagnosis not present

## 2017-01-20 DIAGNOSIS — M6281 Muscle weakness (generalized): Secondary | ICD-10-CM | POA: Diagnosis not present

## 2017-01-20 DIAGNOSIS — M159 Polyosteoarthritis, unspecified: Secondary | ICD-10-CM | POA: Diagnosis not present

## 2017-01-20 DIAGNOSIS — I110 Hypertensive heart disease with heart failure: Secondary | ICD-10-CM | POA: Diagnosis not present

## 2017-01-27 DIAGNOSIS — Z87891 Personal history of nicotine dependence: Secondary | ICD-10-CM | POA: Diagnosis not present

## 2017-01-27 DIAGNOSIS — I509 Heart failure, unspecified: Secondary | ICD-10-CM | POA: Diagnosis not present

## 2017-01-27 DIAGNOSIS — R600 Localized edema: Secondary | ICD-10-CM | POA: Diagnosis not present

## 2017-01-27 DIAGNOSIS — N183 Chronic kidney disease, stage 3 (moderate): Secondary | ICD-10-CM | POA: Diagnosis not present

## 2017-01-27 DIAGNOSIS — Z7901 Long term (current) use of anticoagulants: Secondary | ICD-10-CM | POA: Diagnosis not present

## 2017-01-27 DIAGNOSIS — I10 Essential (primary) hypertension: Secondary | ICD-10-CM | POA: Diagnosis not present

## 2017-01-27 DIAGNOSIS — I4891 Unspecified atrial fibrillation: Secondary | ICD-10-CM | POA: Diagnosis not present

## 2017-01-27 DIAGNOSIS — I824Y2 Acute embolism and thrombosis of unspecified deep veins of left proximal lower extremity: Secondary | ICD-10-CM | POA: Diagnosis not present

## 2017-01-27 DIAGNOSIS — J449 Chronic obstructive pulmonary disease, unspecified: Secondary | ICD-10-CM | POA: Diagnosis not present

## 2017-01-28 LAB — CUP PACEART REMOTE DEVICE CHECK
Battery Remaining Longevity: 102 mo
Battery Remaining Percentage: 100 %
Brady Statistic RV Percent Paced: 68 %
Date Time Interrogation Session: 20180621044100
Implantable Lead Implant Date: 20000401
Implantable Lead Location: 753860
Implantable Lead Model: 4245
Implantable Lead Serial Number: 402472
Implantable Pulse Generator Implant Date: 20150505
Lead Channel Impedance Value: 420 Ohm
Lead Channel Pacing Threshold Amplitude: 0.7 V
Lead Channel Pacing Threshold Pulse Width: 0.4 ms
Lead Channel Setting Pacing Amplitude: 1.2 V
Lead Channel Setting Pacing Pulse Width: 0.4 ms
Lead Channel Setting Sensing Sensitivity: 2.5 mV
Pulse Gen Serial Number: 390662

## 2017-01-29 DIAGNOSIS — M159 Polyosteoarthritis, unspecified: Secondary | ICD-10-CM | POA: Diagnosis not present

## 2017-01-29 DIAGNOSIS — S81812A Laceration without foreign body, left lower leg, initial encounter: Secondary | ICD-10-CM | POA: Diagnosis not present

## 2017-01-29 DIAGNOSIS — M6281 Muscle weakness (generalized): Secondary | ICD-10-CM | POA: Diagnosis not present

## 2017-01-29 DIAGNOSIS — G20 Parkinson's disease: Secondary | ICD-10-CM | POA: Diagnosis not present

## 2017-01-29 DIAGNOSIS — I110 Hypertensive heart disease with heart failure: Secondary | ICD-10-CM | POA: Diagnosis not present

## 2017-01-29 DIAGNOSIS — M4807 Spinal stenosis, lumbosacral region: Secondary | ICD-10-CM | POA: Diagnosis not present

## 2017-02-03 DIAGNOSIS — M159 Polyosteoarthritis, unspecified: Secondary | ICD-10-CM | POA: Diagnosis not present

## 2017-02-03 DIAGNOSIS — I110 Hypertensive heart disease with heart failure: Secondary | ICD-10-CM | POA: Diagnosis not present

## 2017-02-03 DIAGNOSIS — G20 Parkinson's disease: Secondary | ICD-10-CM | POA: Diagnosis not present

## 2017-02-03 DIAGNOSIS — S81812A Laceration without foreign body, left lower leg, initial encounter: Secondary | ICD-10-CM | POA: Diagnosis not present

## 2017-02-03 DIAGNOSIS — M4807 Spinal stenosis, lumbosacral region: Secondary | ICD-10-CM | POA: Diagnosis not present

## 2017-02-03 DIAGNOSIS — M6281 Muscle weakness (generalized): Secondary | ICD-10-CM | POA: Diagnosis not present

## 2017-02-10 DIAGNOSIS — S81812A Laceration without foreign body, left lower leg, initial encounter: Secondary | ICD-10-CM | POA: Diagnosis not present

## 2017-02-10 DIAGNOSIS — M6281 Muscle weakness (generalized): Secondary | ICD-10-CM | POA: Diagnosis not present

## 2017-02-10 DIAGNOSIS — I110 Hypertensive heart disease with heart failure: Secondary | ICD-10-CM | POA: Diagnosis not present

## 2017-02-10 DIAGNOSIS — M159 Polyosteoarthritis, unspecified: Secondary | ICD-10-CM | POA: Diagnosis not present

## 2017-02-10 DIAGNOSIS — G20 Parkinson's disease: Secondary | ICD-10-CM | POA: Diagnosis not present

## 2017-02-10 DIAGNOSIS — M4807 Spinal stenosis, lumbosacral region: Secondary | ICD-10-CM | POA: Diagnosis not present

## 2017-02-13 DIAGNOSIS — I110 Hypertensive heart disease with heart failure: Secondary | ICD-10-CM | POA: Diagnosis not present

## 2017-02-13 DIAGNOSIS — I2510 Atherosclerotic heart disease of native coronary artery without angina pectoris: Secondary | ICD-10-CM | POA: Diagnosis not present

## 2017-02-13 DIAGNOSIS — G20 Parkinson's disease: Secondary | ICD-10-CM | POA: Diagnosis not present

## 2017-02-13 DIAGNOSIS — I4891 Unspecified atrial fibrillation: Secondary | ICD-10-CM | POA: Diagnosis not present

## 2017-02-13 DIAGNOSIS — M4807 Spinal stenosis, lumbosacral region: Secondary | ICD-10-CM | POA: Diagnosis not present

## 2017-02-13 DIAGNOSIS — I509 Heart failure, unspecified: Secondary | ICD-10-CM | POA: Diagnosis not present

## 2017-02-13 DIAGNOSIS — S81812D Laceration without foreign body, left lower leg, subsequent encounter: Secondary | ICD-10-CM | POA: Diagnosis not present

## 2017-02-13 DIAGNOSIS — M419 Scoliosis, unspecified: Secondary | ICD-10-CM | POA: Diagnosis not present

## 2017-02-17 DIAGNOSIS — I509 Heart failure, unspecified: Secondary | ICD-10-CM | POA: Diagnosis not present

## 2017-02-17 DIAGNOSIS — M419 Scoliosis, unspecified: Secondary | ICD-10-CM | POA: Diagnosis not present

## 2017-02-17 DIAGNOSIS — I110 Hypertensive heart disease with heart failure: Secondary | ICD-10-CM | POA: Diagnosis not present

## 2017-02-17 DIAGNOSIS — G20 Parkinson's disease: Secondary | ICD-10-CM | POA: Diagnosis not present

## 2017-02-17 DIAGNOSIS — S81812D Laceration without foreign body, left lower leg, subsequent encounter: Secondary | ICD-10-CM | POA: Diagnosis not present

## 2017-02-17 DIAGNOSIS — M4807 Spinal stenosis, lumbosacral region: Secondary | ICD-10-CM | POA: Diagnosis not present

## 2017-02-18 DIAGNOSIS — R531 Weakness: Secondary | ICD-10-CM | POA: Diagnosis not present

## 2017-02-18 DIAGNOSIS — R404 Transient alteration of awareness: Secondary | ICD-10-CM | POA: Diagnosis not present

## 2017-02-19 ENCOUNTER — Emergency Department (HOSPITAL_COMMUNITY): Payer: Medicare Other

## 2017-02-19 ENCOUNTER — Emergency Department (HOSPITAL_COMMUNITY)
Admission: EM | Admit: 2017-02-19 | Discharge: 2017-02-19 | Disposition: A | Discharge status: Home / Self Care | Payer: Medicare Other | Attending: Emergency Medicine | Admitting: Emergency Medicine

## 2017-02-19 ENCOUNTER — Encounter (HOSPITAL_COMMUNITY): Payer: Self-pay | Admitting: Emergency Medicine

## 2017-02-19 DIAGNOSIS — M7989 Other specified soft tissue disorders: Secondary | ICD-10-CM | POA: Diagnosis not present

## 2017-02-19 DIAGNOSIS — Z79899 Other long term (current) drug therapy: Secondary | ICD-10-CM | POA: Diagnosis not present

## 2017-02-19 DIAGNOSIS — Y929 Unspecified place or not applicable: Secondary | ICD-10-CM | POA: Insufficient documentation

## 2017-02-19 DIAGNOSIS — Z7901 Long term (current) use of anticoagulants: Secondary | ICD-10-CM | POA: Diagnosis not present

## 2017-02-19 DIAGNOSIS — R58 Hemorrhage, not elsewhere classified: Secondary | ICD-10-CM | POA: Insufficient documentation

## 2017-02-19 DIAGNOSIS — I509 Heart failure, unspecified: Secondary | ICD-10-CM | POA: Insufficient documentation

## 2017-02-19 DIAGNOSIS — W010XXA Fall on same level from slipping, tripping and stumbling without subsequent striking against object, initial encounter: Secondary | ICD-10-CM | POA: Diagnosis not present

## 2017-02-19 DIAGNOSIS — S6990XA Unspecified injury of unspecified wrist, hand and finger(s), initial encounter: Secondary | ICD-10-CM | POA: Diagnosis not present

## 2017-02-19 DIAGNOSIS — Z87891 Personal history of nicotine dependence: Secondary | ICD-10-CM | POA: Insufficient documentation

## 2017-02-19 DIAGNOSIS — I110 Hypertensive heart disease with heart failure: Secondary | ICD-10-CM | POA: Diagnosis not present

## 2017-02-19 DIAGNOSIS — S0990XA Unspecified injury of head, initial encounter: Secondary | ICD-10-CM | POA: Diagnosis not present

## 2017-02-19 DIAGNOSIS — S6991XA Unspecified injury of right wrist, hand and finger(s), initial encounter: Secondary | ICD-10-CM | POA: Diagnosis not present

## 2017-02-19 DIAGNOSIS — R233 Spontaneous ecchymoses: Secondary | ICD-10-CM | POA: Diagnosis not present

## 2017-02-19 DIAGNOSIS — W19XXXA Unspecified fall, initial encounter: Secondary | ICD-10-CM

## 2017-02-19 DIAGNOSIS — Y999 Unspecified external cause status: Secondary | ICD-10-CM | POA: Insufficient documentation

## 2017-02-19 DIAGNOSIS — M79641 Pain in right hand: Secondary | ICD-10-CM | POA: Diagnosis present

## 2017-02-19 DIAGNOSIS — R2689 Other abnormalities of gait and mobility: Secondary | ICD-10-CM | POA: Diagnosis not present

## 2017-02-19 DIAGNOSIS — S199XXA Unspecified injury of neck, initial encounter: Secondary | ICD-10-CM | POA: Diagnosis not present

## 2017-02-19 DIAGNOSIS — Y939 Activity, unspecified: Secondary | ICD-10-CM | POA: Insufficient documentation

## 2017-02-19 NOTE — ED Notes (Signed)
[employer] refused to take pt, pt back in hall, calling [facility] to pick up pt.

## 2017-02-19 NOTE — ED Notes (Addendum)
Pt D/C to [facility] via [facility]  No walker present in room or on stretcher, pt D/C without walker

## 2017-02-19 NOTE — ED Provider Notes (Signed)
[facility] DEPT Provider Note   CSN: [id number] Arrival date & time: 02/19/17  0011  By signing my name below, I, Mayer Masker, attest that this documentation has been prepared under the direction and in the presence of Tristian Sickinger, MD  Electronically Signed: Mayer Masker, Scribe. 02/19/17. 1:57 AM.  History   Chief Complaint Chief Complaint  Patient presents with  . Fall   The history is provided by the patient. No language interpreter was used.  Fall  This is a new problem. The current episode started more than 2 days ago. The problem occurs constantly. The problem has not changed since onset.Pertinent negatives include no chest pain, no abdominal pain, no headaches and no shortness of breath. Nothing aggravates the symptoms. Nothing relieves the symptoms. He has tried nothing for the symptoms. The treatment provided no relief.    HPI Comments: [name] is a 83 y.o. male who presents to the Emergency Department complaining of constant, gradually worsening right hand pain s/p a fall. He states he had both hands occupied when he fell over backwards and in the process, hit his right hand. Pt has swelling and bruising to the right hand.   Past Medical History:  Diagnosis Date  . Arthritis    incl spinal stenosis  . Atrial fibrillation ([place])   . CHF (congestive heart failure) ([place])   . HTN (hypertension)   . Renal insufficiency    question degree  . Wears glasses    reading  . Wears partial dentures    upper partial    Patient Active Problem List   Diagnosis Date Noted  . CHF (congestive heart failure) ([place])   . Hypertension 09/17/2012  . Pneumonia 12/03/2011  . Syncope 10/13/2011  . ATRIAL FIBRILLATION 02/19/2010  . RENAL INSUFFICIENCY 02/19/2010  . PACEMAKER, single [employer] 02/19/2010    Past Surgical History:  Procedure Laterality Date  . bilateral shoulder surgery    . EYE SURGERY     both catararcts  . I&D EXTREMITY  12/07/2011   Procedure: IRRIGATION AND DEBRIDEMENT EXTREMITY;  Surgeon: Tennis Must, MD;  Location: [facility];  Service: Orthopedics;  Laterality: Left;  . PACEMAKER GENERATOR CHANGE N/A 11/28/2013   Procedure: PACEMAKER GENERATOR CHANGE;  Surgeon: Deboraha Sprang, MD;  Location: [facility] CATH LAB;  Service: Cardiovascular;  Laterality: N/A;  . PACEMAKER INSERTION     BSX pacemaker generator change 11-28-2013 by Dr Caryl Comes  . prosthetic hip - bilaterally    . R knee quadricep tendon repair    . TONSILLECTOMY    . TRIGGER FINGER RELEASE Right 05/26/2013   Procedure: RIGHT SMALL TRIGGER RELEASE ;  Surgeon: Tennis Must, MD;  Location: [facility];  Service: Orthopedics;  Laterality: Right;       Home Medications    Prior to Admission medications   Medication Sig Start Date End Date Taking? Authorizing Provider  acetaminophen (TYLENOL) 500 MG tablet Take 1,000 mg by mouth 2 (two) times daily. scheduled    [provider]  amoxicillin (AMOXIL) 500 MG capsule Take 500 mg by mouth See admin instructions. Take 4 capsules (2000 mg) by mouth prior to dental appointment    [provider]  apixaban (ELIQUIS) 5 MG TABS tablet Take 5 mg by mouth 2 (two) times daily.    [provider]  Ascorbic Acid (VITAMIN C) 1000 MG tablet Take 1,000 mg by mouth daily.    [provider]  b complex vitamins tablet Take 1 tablet by  mouth daily.    [provider]  beta carotene w/minerals (OCUVITE) tablet Take 1 tablet by mouth 2 (two) times daily.    [provider]  bisacodyl (DULCOLAX) 5 MG EC tablet Take 10 mg by mouth daily as needed for moderate constipation.    [provider]  CALCIUM CITRATE PO Take 1 tablet by mouth daily.     [provider]  carbidopa-levodopa (SINEMET IR) 25-100 MG tablet Take 0.5 tablets by mouth 3 (three) times daily. 12/09/16   Ward Givens, NP  carvedilol (COREG) 3.125 MG tablet Take 3.125-6.25 mg by mouth 2 (two) times  daily as needed (high blood pressure). If blood pressure under 100 do not take any of this medication, between 100 and 140 take 1 tablet (3.125 mg), over 140 take 2 tablets (6.25 mg) 02/11/11   Deboraha Sprang, MD  Cascara Sagrada 450 MG CAPS Take 450 mg by mouth daily as needed (constipation).    [provider]  Cholecalciferol (VITAMIN D-3) 5000 UNITS TABS Take 5,000 Units by mouth daily.    [provider]  cyclobenzaprine (FLEXERIL) 10 MG tablet Take 1 tablet (10 mg total) by mouth 2 (two) times daily as needed for muscle spasms. 06/18/14   [name], [name]  finasteride (PROSCAR) 5 MG tablet Take 5 mg by mouth daily.     [provider]  furosemide (LASIX) 20 MG tablet Take 20 mg by mouth daily.    [provider]  HYDROcodone-acetaminophen (NORCO/VICODIN) 5-325 MG per tablet Take 1-2 tablets by mouth every 4 (four) hours as needed for moderate pain or severe pain. 06/18/14   Szekalski, Kaitlyn, [name]  Mouthwashes (BIOTENE DRY MOUTH MT) Use as directed 10 mLs in the mouth or throat daily as needed (dry mouth).    [provider]  Multiple Vitamin (MULITIVITAMIN WITH MINERALS) TABS Take 1 tablet by mouth daily.     [provider]  Omega-3 Fatty Acids (FISH OIL) 1200 MG CAPS Take 2,400 mg by mouth 2 (two) times daily.    [provider]  oxybutynin (DITROPAN-XL) 5 MG 24 hr tablet Take 5 mg by mouth at bedtime.    [provider]  polyvinyl alcohol (LIQUIFILM TEARS) 1.4 % ophthalmic solution Place 1 drop into both eyes 5 (five) times daily as needed for dry eyes.     [provider]  pravastatin (PRAVACHOL) 40 MG tablet Take 40 mg by mouth at bedtime.    [provider]  sacubitril-valsartan (ENTRESTO) 24-26 MG Take 1 tablet by mouth 2 (two) times daily.    [provider]  Sodium Fluoride (FLUORIDEX DAILY DEFENSE DT) Place 1 application onto teeth daily.    [provider]  Tamsulosin  HCl (FLOMAX) 0.4 MG CAPS Take 0.4 mg by mouth 2 (two) times daily.     [provider]  traMADol (ULTRAM) 50 MG tablet Take 100 mg by mouth 2 (two) times daily. scheduled    [provider]  zolpidem (AMBIEN) 10 MG tablet Take 10 mg by mouth at bedtime as needed for sleep.     [provider]    Family History No family history on file.  Social History Social History  Substance Use Topics  . Smoking status: Former Smoker    Types: Cigarettes    Quit date: 05/13/1971  . Smokeless tobacco: Never Used  . Alcohol use Yes     Comment: occasional     Allergies   Amiodarone   Review of  Systems Review of Systems  Respiratory: Negative for shortness of breath.   Cardiovascular: Negative for chest pain.  Gastrointestinal: Negative for abdominal pain.  Neurological: Negative for headaches.  All other systems reviewed and are negative.    Physical Exam Updated Vital Signs There were no vitals taken for this visit.  Physical Exam  Constitutional: He is oriented to person, place, and time. He appears well-developed and well-nourished.  HENT:  Head: Normocephalic and atraumatic.  Mouth/Throat: Oropharynx is clear and moist. No oropharyngeal exudate.  No battle sign, raccoon sign Good mucus membranes No hemo tm on right or left  Eyes: Pupils are equal, round, and reactive to light. EOM are normal.  Neck: Normal range of motion.  Cardiovascular: Normal rate, regular rhythm, normal heart sounds and intact distal pulses.  Exam reveals no gallop and no friction rub.   No murmur heard. Pulmonary/Chest: Effort normal and breath sounds normal. No respiratory distress. He has no wheezes. He has no rales.  Abdominal: Soft. Bowel sounds are normal. He exhibits no distension and no mass. There is no tenderness. There is no rebound and no guarding.  Musculoskeletal: Normal range of motion.  Pelvis stable  Neurological: He is alert and oriented to person, place,  and time.  Skin: Skin is warm and dry.  Psychiatric: He has a normal mood and affect. Judgment normal.  Nursing note and vitals reviewed.    ED Treatments / Results   Vitals:   02/19/17 0600 02/19/17 0615  BP: (!) 150/67 (!) 162/84  Pulse: 60 63  Resp: 16 13    COORDINATION OF CARE: 1:57 AM Discussed treatment plan with pt at bedside and pt agreed to plan.  Radiology No results found.  Procedures Procedures (including critical care time)    Final Clinical Impressions(s) / ED Diagnoses  Strict return precautions given for weakness, difficulty ambulating, inability to tolerate oral medication, worsening pain, fevers, vomiting, decreased urine output, or any concerns. No signs of systemic illness or infection. The patient is nontoxic-appearing on exam and vital signs are within normal limits.   I have reviewed the triage vital signs and the nursing notes. Pertinent labs &imaging results that were available during my care of the patient were reviewed by me and considered in my medical decision making (see chart for details).  After history, exam, and medical workup I feel the patient has been appropriately medically screened and is safe for discharge home. Pertinent diagnoses were discussed with the patient. Patient was given return precautions  I personally performed the services described in this documentation, which was scribed in my presence. The recorded information has been reviewed and is accurate.      Mouhamad Teed, MD 02/19/17 [id number]

## 2017-02-19 NOTE — ED Notes (Addendum)
Pt states he fell at home and denies LOC. Pt states his right hand hurts and lower back hurts.

## 2017-02-19 NOTE — ED Triage Notes (Signed)
Per EMS, pt from home with c/o fall to bottom falling backwards, denies LOC, no injuries to head. Right hand pain from a fall on 02/17/17. Small skin tear to right elbow. Pt uses a walker at home. Alert and oriented to baseline. EMS vitals: BP-122/70, P-60, SpO2-100 room air, RR-18, CBG-161

## 2017-02-23 DIAGNOSIS — I110 Hypertensive heart disease with heart failure: Secondary | ICD-10-CM | POA: Diagnosis not present

## 2017-02-23 DIAGNOSIS — M419 Scoliosis, unspecified: Secondary | ICD-10-CM | POA: Diagnosis not present

## 2017-02-23 DIAGNOSIS — S81812D Laceration without foreign body, left lower leg, subsequent encounter: Secondary | ICD-10-CM | POA: Diagnosis not present

## 2017-02-23 DIAGNOSIS — I509 Heart failure, unspecified: Secondary | ICD-10-CM | POA: Diagnosis not present

## 2017-02-23 DIAGNOSIS — M4807 Spinal stenosis, lumbosacral region: Secondary | ICD-10-CM | POA: Diagnosis not present

## 2017-02-23 DIAGNOSIS — G20 Parkinson's disease: Secondary | ICD-10-CM | POA: Diagnosis not present

## 2017-02-26 DIAGNOSIS — L97929 Non-pressure chronic ulcer of unspecified part of left lower leg with unspecified severity: Secondary | ICD-10-CM | POA: Diagnosis not present

## 2017-02-26 DIAGNOSIS — I82402 Acute embolism and thrombosis of unspecified deep veins of left lower extremity: Secondary | ICD-10-CM | POA: Diagnosis not present

## 2017-02-26 DIAGNOSIS — R600 Localized edema: Secondary | ICD-10-CM | POA: Diagnosis not present

## 2017-03-03 DIAGNOSIS — S81812D Laceration without foreign body, left lower leg, subsequent encounter: Secondary | ICD-10-CM | POA: Diagnosis not present

## 2017-03-03 DIAGNOSIS — I509 Heart failure, unspecified: Secondary | ICD-10-CM | POA: Diagnosis not present

## 2017-03-03 DIAGNOSIS — G20 Parkinson's disease: Secondary | ICD-10-CM | POA: Diagnosis not present

## 2017-03-03 DIAGNOSIS — M4807 Spinal stenosis, lumbosacral region: Secondary | ICD-10-CM | POA: Diagnosis not present

## 2017-03-03 DIAGNOSIS — M419 Scoliosis, unspecified: Secondary | ICD-10-CM | POA: Diagnosis not present

## 2017-03-03 DIAGNOSIS — I110 Hypertensive heart disease with heart failure: Secondary | ICD-10-CM | POA: Diagnosis not present

## 2017-03-10 ENCOUNTER — Encounter (HOSPITAL_BASED_OUTPATIENT_CLINIC_OR_DEPARTMENT_OTHER): Payer: Medicare Other | Attending: Surgery

## 2017-03-10 DIAGNOSIS — Z7901 Long term (current) use of anticoagulants: Secondary | ICD-10-CM | POA: Insufficient documentation

## 2017-03-10 DIAGNOSIS — I890 Lymphedema, not elsewhere classified: Secondary | ICD-10-CM | POA: Insufficient documentation

## 2017-03-10 DIAGNOSIS — Z86718 Personal history of other venous thrombosis and embolism: Secondary | ICD-10-CM | POA: Insufficient documentation

## 2017-03-10 DIAGNOSIS — G20 Parkinson's disease: Secondary | ICD-10-CM | POA: Insufficient documentation

## 2017-03-10 DIAGNOSIS — Z87891 Personal history of nicotine dependence: Secondary | ICD-10-CM | POA: Diagnosis not present

## 2017-03-10 DIAGNOSIS — I110 Hypertensive heart disease with heart failure: Secondary | ICD-10-CM | POA: Insufficient documentation

## 2017-03-10 DIAGNOSIS — Z950 Presence of cardiac pacemaker: Secondary | ICD-10-CM | POA: Diagnosis not present

## 2017-03-10 DIAGNOSIS — L97322 Non-pressure chronic ulcer of left ankle with fat layer exposed: Secondary | ICD-10-CM | POA: Diagnosis not present

## 2017-03-10 DIAGNOSIS — I509 Heart failure, unspecified: Secondary | ICD-10-CM | POA: Insufficient documentation

## 2017-03-10 DIAGNOSIS — Z96643 Presence of artificial hip joint, bilateral: Secondary | ICD-10-CM | POA: Insufficient documentation

## 2017-03-10 DIAGNOSIS — Z79899 Other long term (current) drug therapy: Secondary | ICD-10-CM | POA: Diagnosis not present

## 2017-03-12 DIAGNOSIS — S81812D Laceration without foreign body, left lower leg, subsequent encounter: Secondary | ICD-10-CM | POA: Diagnosis not present

## 2017-03-12 DIAGNOSIS — M419 Scoliosis, unspecified: Secondary | ICD-10-CM | POA: Diagnosis not present

## 2017-03-12 DIAGNOSIS — M4807 Spinal stenosis, lumbosacral region: Secondary | ICD-10-CM | POA: Diagnosis not present

## 2017-03-12 DIAGNOSIS — I110 Hypertensive heart disease with heart failure: Secondary | ICD-10-CM | POA: Diagnosis not present

## 2017-03-12 DIAGNOSIS — G20 Parkinson's disease: Secondary | ICD-10-CM | POA: Diagnosis not present

## 2017-03-12 DIAGNOSIS — I509 Heart failure, unspecified: Secondary | ICD-10-CM | POA: Diagnosis not present

## 2017-03-16 DIAGNOSIS — M419 Scoliosis, unspecified: Secondary | ICD-10-CM | POA: Diagnosis not present

## 2017-03-16 DIAGNOSIS — M4807 Spinal stenosis, lumbosacral region: Secondary | ICD-10-CM | POA: Diagnosis not present

## 2017-03-16 DIAGNOSIS — I110 Hypertensive heart disease with heart failure: Secondary | ICD-10-CM | POA: Diagnosis not present

## 2017-03-16 DIAGNOSIS — S81812D Laceration without foreign body, left lower leg, subsequent encounter: Secondary | ICD-10-CM | POA: Diagnosis not present

## 2017-03-16 DIAGNOSIS — G20 Parkinson's disease: Secondary | ICD-10-CM | POA: Diagnosis not present

## 2017-03-16 DIAGNOSIS — I509 Heart failure, unspecified: Secondary | ICD-10-CM | POA: Diagnosis not present

## 2017-03-18 DIAGNOSIS — Z950 Presence of cardiac pacemaker: Secondary | ICD-10-CM | POA: Diagnosis not present

## 2017-03-18 DIAGNOSIS — Z96643 Presence of artificial hip joint, bilateral: Secondary | ICD-10-CM | POA: Diagnosis not present

## 2017-03-18 DIAGNOSIS — L97322 Non-pressure chronic ulcer of left ankle with fat layer exposed: Secondary | ICD-10-CM | POA: Diagnosis not present

## 2017-03-18 DIAGNOSIS — I872 Venous insufficiency (chronic) (peripheral): Secondary | ICD-10-CM | POA: Diagnosis not present

## 2017-03-18 DIAGNOSIS — I890 Lymphedema, not elsewhere classified: Secondary | ICD-10-CM | POA: Diagnosis not present

## 2017-03-18 DIAGNOSIS — Z86718 Personal history of other venous thrombosis and embolism: Secondary | ICD-10-CM | POA: Diagnosis not present

## 2017-03-18 DIAGNOSIS — Z87891 Personal history of nicotine dependence: Secondary | ICD-10-CM | POA: Diagnosis not present

## 2017-03-19 DIAGNOSIS — M419 Scoliosis, unspecified: Secondary | ICD-10-CM | POA: Diagnosis not present

## 2017-03-19 DIAGNOSIS — I509 Heart failure, unspecified: Secondary | ICD-10-CM | POA: Diagnosis not present

## 2017-03-19 DIAGNOSIS — I110 Hypertensive heart disease with heart failure: Secondary | ICD-10-CM | POA: Diagnosis not present

## 2017-03-19 DIAGNOSIS — S81812D Laceration without foreign body, left lower leg, subsequent encounter: Secondary | ICD-10-CM | POA: Diagnosis not present

## 2017-03-19 DIAGNOSIS — G20 Parkinson's disease: Secondary | ICD-10-CM | POA: Diagnosis not present

## 2017-03-19 DIAGNOSIS — M4807 Spinal stenosis, lumbosacral region: Secondary | ICD-10-CM | POA: Diagnosis not present

## 2017-03-24 DIAGNOSIS — I509 Heart failure, unspecified: Secondary | ICD-10-CM | POA: Diagnosis not present

## 2017-03-24 DIAGNOSIS — G20 Parkinson's disease: Secondary | ICD-10-CM | POA: Diagnosis not present

## 2017-03-24 DIAGNOSIS — I110 Hypertensive heart disease with heart failure: Secondary | ICD-10-CM | POA: Diagnosis not present

## 2017-03-24 DIAGNOSIS — S81812D Laceration without foreign body, left lower leg, subsequent encounter: Secondary | ICD-10-CM | POA: Diagnosis not present

## 2017-03-24 DIAGNOSIS — M4807 Spinal stenosis, lumbosacral region: Secondary | ICD-10-CM | POA: Diagnosis not present

## 2017-03-24 DIAGNOSIS — M419 Scoliosis, unspecified: Secondary | ICD-10-CM | POA: Diagnosis not present

## 2017-03-25 DIAGNOSIS — Z96643 Presence of artificial hip joint, bilateral: Secondary | ICD-10-CM | POA: Diagnosis not present

## 2017-03-25 DIAGNOSIS — S91002A Unspecified open wound, left ankle, initial encounter: Secondary | ICD-10-CM | POA: Diagnosis not present

## 2017-03-25 DIAGNOSIS — Z950 Presence of cardiac pacemaker: Secondary | ICD-10-CM | POA: Diagnosis not present

## 2017-03-25 DIAGNOSIS — Z86718 Personal history of other venous thrombosis and embolism: Secondary | ICD-10-CM | POA: Diagnosis not present

## 2017-03-25 DIAGNOSIS — I890 Lymphedema, not elsewhere classified: Secondary | ICD-10-CM | POA: Diagnosis not present

## 2017-03-25 DIAGNOSIS — Z87891 Personal history of nicotine dependence: Secondary | ICD-10-CM | POA: Diagnosis not present

## 2017-03-25 DIAGNOSIS — L97322 Non-pressure chronic ulcer of left ankle with fat layer exposed: Secondary | ICD-10-CM | POA: Diagnosis not present

## 2017-03-27 DIAGNOSIS — M4807 Spinal stenosis, lumbosacral region: Secondary | ICD-10-CM | POA: Diagnosis not present

## 2017-03-27 DIAGNOSIS — I110 Hypertensive heart disease with heart failure: Secondary | ICD-10-CM | POA: Diagnosis not present

## 2017-03-27 DIAGNOSIS — S81812D Laceration without foreign body, left lower leg, subsequent encounter: Secondary | ICD-10-CM | POA: Diagnosis not present

## 2017-03-27 DIAGNOSIS — G20 Parkinson's disease: Secondary | ICD-10-CM | POA: Diagnosis not present

## 2017-03-27 DIAGNOSIS — M419 Scoliosis, unspecified: Secondary | ICD-10-CM | POA: Diagnosis not present

## 2017-03-27 DIAGNOSIS — I509 Heart failure, unspecified: Secondary | ICD-10-CM | POA: Diagnosis not present

## 2017-03-31 DIAGNOSIS — I509 Heart failure, unspecified: Secondary | ICD-10-CM | POA: Diagnosis not present

## 2017-03-31 DIAGNOSIS — G20 Parkinson's disease: Secondary | ICD-10-CM | POA: Diagnosis not present

## 2017-03-31 DIAGNOSIS — S81812D Laceration without foreign body, left lower leg, subsequent encounter: Secondary | ICD-10-CM | POA: Diagnosis not present

## 2017-03-31 DIAGNOSIS — I110 Hypertensive heart disease with heart failure: Secondary | ICD-10-CM | POA: Diagnosis not present

## 2017-03-31 DIAGNOSIS — M419 Scoliosis, unspecified: Secondary | ICD-10-CM | POA: Diagnosis not present

## 2017-03-31 DIAGNOSIS — M4807 Spinal stenosis, lumbosacral region: Secondary | ICD-10-CM | POA: Diagnosis not present

## 2017-04-01 ENCOUNTER — Encounter (HOSPITAL_COMMUNITY): Payer: Self-pay | Admitting: *Deleted

## 2017-04-01 ENCOUNTER — Encounter (HOSPITAL_BASED_OUTPATIENT_CLINIC_OR_DEPARTMENT_OTHER): Payer: Medicare Other | Attending: Surgery

## 2017-04-01 ENCOUNTER — Emergency Department (HOSPITAL_COMMUNITY)
Admission: EM | Admit: 2017-04-01 | Discharge: 2017-04-01 | Disposition: A | Discharge status: Home / Self Care | Payer: Medicare Other | Attending: Emergency Medicine | Admitting: Emergency Medicine

## 2017-04-01 DIAGNOSIS — H578 Other specified disorders of eye and adnexa: Secondary | ICD-10-CM | POA: Diagnosis not present

## 2017-04-01 DIAGNOSIS — G20 Parkinson's disease: Secondary | ICD-10-CM | POA: Insufficient documentation

## 2017-04-01 DIAGNOSIS — Z79899 Other long term (current) drug therapy: Secondary | ICD-10-CM | POA: Diagnosis not present

## 2017-04-01 DIAGNOSIS — R5383 Other fatigue: Secondary | ICD-10-CM | POA: Insufficient documentation

## 2017-04-01 DIAGNOSIS — Z950 Presence of cardiac pacemaker: Secondary | ICD-10-CM | POA: Diagnosis not present

## 2017-04-01 DIAGNOSIS — Z09 Encounter for follow-up examination after completed treatment for conditions other than malignant neoplasm: Secondary | ICD-10-CM | POA: Insufficient documentation

## 2017-04-01 DIAGNOSIS — I890 Lymphedema, not elsewhere classified: Secondary | ICD-10-CM | POA: Insufficient documentation

## 2017-04-01 DIAGNOSIS — Z7901 Long term (current) use of anticoagulants: Secondary | ICD-10-CM | POA: Insufficient documentation

## 2017-04-01 DIAGNOSIS — I509 Heart failure, unspecified: Secondary | ICD-10-CM | POA: Insufficient documentation

## 2017-04-01 DIAGNOSIS — R0609 Other forms of dyspnea: Secondary | ICD-10-CM | POA: Diagnosis not present

## 2017-04-01 DIAGNOSIS — R233 Spontaneous ecchymoses: Secondary | ICD-10-CM | POA: Diagnosis not present

## 2017-04-01 DIAGNOSIS — T148XXA Other injury of unspecified body region, initial encounter: Secondary | ICD-10-CM | POA: Diagnosis not present

## 2017-04-01 DIAGNOSIS — I110 Hypertensive heart disease with heart failure: Secondary | ICD-10-CM | POA: Diagnosis not present

## 2017-04-01 DIAGNOSIS — Z872 Personal history of diseases of the skin and subcutaneous tissue: Secondary | ICD-10-CM | POA: Insufficient documentation

## 2017-04-01 DIAGNOSIS — R531 Weakness: Secondary | ICD-10-CM | POA: Diagnosis not present

## 2017-04-01 DIAGNOSIS — Z87891 Personal history of nicotine dependence: Secondary | ICD-10-CM | POA: Diagnosis not present

## 2017-04-01 DIAGNOSIS — H1132 Conjunctival hemorrhage, left eye: Secondary | ICD-10-CM | POA: Insufficient documentation

## 2017-04-01 DIAGNOSIS — I482 Chronic atrial fibrillation: Secondary | ICD-10-CM | POA: Insufficient documentation

## 2017-04-01 DIAGNOSIS — R42 Dizziness and giddiness: Secondary | ICD-10-CM | POA: Diagnosis not present

## 2017-04-01 DIAGNOSIS — Z86718 Personal history of other venous thrombosis and embolism: Secondary | ICD-10-CM | POA: Insufficient documentation

## 2017-04-01 HISTORY — DX: Parkinson's disease: G20

## 2017-04-01 HISTORY — DX: Parkinson's disease without dyskinesia, without mention of fluctuations: G20.A1

## 2017-04-01 LAB — BASIC METABOLIC PANEL
Anion gap: 10 (ref 5–15)
BUN: 28 mg/dL — ABNORMAL HIGH (ref 6–20)
CO2: 22 mmol/L (ref 22–32)
Calcium: 8.8 mg/dL — ABNORMAL LOW (ref 8.9–10.3)
Chloride: 104 mmol/L (ref 101–111)
Creatinine, Ser: 1.4 mg/dL — ABNORMAL HIGH (ref 0.61–1.24)
GFR calc Af Amer: 50 mL/min — ABNORMAL LOW (ref >60)
GFR calc non Af Amer: 43 mL/min — ABNORMAL LOW (ref >60)
Glucose, Bld: 121 mg/dL — ABNORMAL HIGH (ref 65–99)
Potassium: 4.4 mmol/L (ref 3.5–5.1)
Sodium: 136 mmol/L (ref 135–145)

## 2017-04-01 LAB — URINALYSIS, ROUTINE W REFLEX MICROSCOPIC
Bilirubin Urine: NEGATIVE
Glucose, UA: NEGATIVE mg/dL
Hgb urine dipstick: NEGATIVE
Ketones, ur: NEGATIVE mg/dL
Nitrite: NEGATIVE
Protein, ur: NEGATIVE mg/dL
Specific Gravity, Urine: 1.003 — ABNORMAL LOW (ref 1.005–1.030)
Squamous Epithelial / LPF: NONE SEEN
pH: 6 (ref 5.0–8.0)

## 2017-04-01 LAB — CBC
HCT: 33.4 % — ABNORMAL LOW (ref 39.0–52.0)
Hemoglobin: 10.7 g/dL — ABNORMAL LOW (ref 13.0–17.0)
MCH: 30.7 pg (ref 26.0–34.0)
MCHC: 32 g/dL (ref 30.0–36.0)
MCV: 95.7 fL (ref 78.0–100.0)
Platelets: 131 10*3/uL — ABNORMAL LOW (ref 150–400)
RBC: 3.49 MIL/uL — ABNORMAL LOW (ref 4.22–5.81)
RDW: 14.3 % (ref 11.5–15.5)
WBC: 6.4 10*3/uL (ref 4.0–10.5)

## 2017-04-01 NOTE — ED Triage Notes (Signed)
Pt c/o weakness  & unsteady gait, pt seen by PCP today for waking up with bleeding into the L sclera & bruising to the L eye down L cheek without injury, pt takes xerelto, PCP recommends pt stop Xerelto for 3 days, pt sent here for eval for weakness, pt A&O x4, no slurred speech, no facial droop, follows commands, speaks in complete sentences

## 2017-04-01 NOTE — Discharge Instructions (Signed)
Holds her blood thinner as per recommendation a primary care doctor. For the blood around the left eye follow-up with the eye doctor. Referral information provided above. Hold your blood pressure medicine until seen by primary care doctor. Keep a daily log of your blood pressure medicine and give a call to see what they want to do to adjust your blood pressure medicines.  Return for any new or worse symptoms.

## 2017-04-01 NOTE — ED Notes (Signed)
Walked pt in hall, pt was successful and felt fine.

## 2017-04-01 NOTE — ED Provider Notes (Signed)
[facility] DEPT Provider Note   CSN: [id number] Arrival date & time: 04/01/17  1520     History   Chief Complaint No chief complaint on file.   HPI [name] is a 83 y.o. male.  Patient was a doctor's office today. He woke this morning with the bruising below his left eye. And blood around the white part of his left eye. Patient didn't have any complaints. Patient is on blood thinners. Patient also known to have renal insufficiency. The doctor's office patient was weak and blood pressures were low upon arrival here. That's why he was referred in. Primary care doctor stopped his blood thinners and referred him to ophthalmology for follow-up. But when he was weak. He was referred in for possible admission. Patient has a pacemaker. Patient states now is feeling better. Unfortunately a long way in the waiting room. And stated that he was able to walk around with better strength and not feeling as weak.      Past Medical History:  Diagnosis Date  . Arthritis    incl spinal stenosis  . Atrial fibrillation ([place])   . CHF (congestive heart failure) ([place])   . CHF (congestive heart failure) ([place])   . HTN (hypertension)   . Parkinson disease ([place])   . Renal insufficiency    question degree  . Wears glasses    reading  . Wears partial dentures    upper partial    Patient Active Problem List   Diagnosis Date Noted  . CHF (congestive heart failure) ([place])   . Hypertension 09/17/2012  . Pneumonia 12/03/2011  . Syncope 10/13/2011  . ATRIAL FIBRILLATION 02/19/2010  . RENAL INSUFFICIENCY 02/19/2010  . PACEMAKER, single [employer] 02/19/2010    Past Surgical History:  Procedure Laterality Date  . bilateral shoulder surgery    . EYE SURGERY     both catararcts  . I&D EXTREMITY  12/07/2011   Procedure: IRRIGATION AND DEBRIDEMENT EXTREMITY;  Surgeon: Tennis Must, MD;  Location: [facility] OR[facility];  Service: Orthopedics;  Laterality: Left;  . PACEMAKER GENERATOR CHANGE N/A  11/28/2013   Procedure: PACEMAKER GENERATOR CHANGE;  Surgeon: Deboraha Sprang, MD;  Location: [facility] CATH LAB;  Service: Cardiovascular;  Laterality: N/A;  . PACEMAKER INSERTION     BSX pacemaker generator change 11-28-2013 by Dr Caryl Comes  . prosthetic hip - bilaterally    . R knee quadricep tendon repair    . TONSILLECTOMY    . TRIGGER FINGER RELEASE Right 05/26/2013   Procedure: RIGHT SMALL TRIGGER RELEASE ;  Surgeon: Tennis Must, MD;  Location: [facility];  Service: Orthopedics;  Laterality: Right;       Home Medications    Prior to Admission medications   Medication Sig Start Date End Date Taking? Authorizing Provider  acetaminophen (TYLENOL) 500 MG tablet Take 1,000 mg by mouth 2 (two) times daily. scheduled    [provider]  amoxicillin (AMOXIL) 500 MG capsule Take 500 mg by mouth See admin instructions. Take 4 capsules (2000 mg) by mouth prior to dental appointment    [provider]  Ascorbic Acid (VITAMIN C) 1000 MG tablet Take 1,000 mg by mouth daily.    [provider]  b complex vitamins tablet Take 1 tablet by mouth daily.    [provider]  beta carotene w/minerals (OCUVITE) tablet Take 1 tablet by mouth 2 (two) times daily.    [provider]  bisacodyl (DULCOLAX) 5 MG EC tablet Take 10 mg by  mouth daily as needed for moderate constipation.    [provider]  CALCIUM CITRATE PO Take 1 tablet by mouth daily.     [provider]  carbidopa-levodopa (SINEMET IR) 25-100 MG tablet Take 0.5 tablets by mouth 3 (three) times daily. 12/09/16   Ward Givens, NP  carvedilol (COREG) 3.125 MG tablet Take 3.125-6.25 mg by mouth 2 (two) times daily as needed (high blood pressure). If blood pressure under 100 do not take any of this medication, between 100 and 140 take 1 tablet (3.125 mg), over 140 take 2 tablets (6.25 mg) 02/11/11   Deboraha Sprang, MD  Cascara Sagrada 450 MG CAPS Take 450 mg by mouth daily as needed  (constipation).    [provider]  Cholecalciferol (VITAMIN D-3) 5000 UNITS TABS Take 5,000 Units by mouth daily.    [provider]  cyclobenzaprine (FLEXERIL) 10 MG tablet Take 1 tablet (10 mg total) by mouth 2 (two) times daily as needed for muscle spasms. 06/18/14   [name], [name]  finasteride (PROSCAR) 5 MG tablet Take 5 mg by mouth daily.     [provider]  furosemide (LASIX) 20 MG tablet Take 20 mg by mouth daily.    [provider]  HYDROcodone-acetaminophen (NORCO/VICODIN) 5-325 MG per tablet Take 1-2 tablets by mouth every 4 (four) hours as needed for moderate pain or severe pain. 06/18/14   Szekalski, Kaitlyn, [name]  Mouthwashes (BIOTENE DRY MOUTH MT) Use as directed 10 mLs in the mouth or throat daily as needed (dry mouth).    [provider]  Multiple Vitamin (MULITIVITAMIN WITH MINERALS) TABS Take 1 tablet by mouth daily.     [provider]  Omega-3 Fatty Acids (FISH OIL) 1200 MG CAPS Take 2,400 mg by mouth 2 (two) times daily.    [provider]  oxybutynin (DITROPAN-XL) 5 MG 24 hr tablet Take 5 mg by mouth at bedtime.    [provider]  polyvinyl alcohol (LIQUIFILM TEARS) 1.4 % ophthalmic solution Place 1 drop into both eyes 5 (five) times daily as needed for dry eyes.     [provider]  pravastatin (PRAVACHOL) 40 MG tablet Take 40 mg by mouth at bedtime.    [provider]  sacubitril-valsartan (ENTRESTO) 24-26 MG Take 1 tablet by mouth 2 (two) times daily.    [provider]  Sodium Fluoride (FLUORIDEX DAILY DEFENSE DT) Place 1 application onto teeth daily.    [provider]  Tamsulosin HCl (FLOMAX) 0.4 MG CAPS Take 0.4 mg by mouth 2 (two) times daily.     [provider]  traMADol (ULTRAM) 50 MG tablet Take 100 mg by mouth 2 (two) times daily. scheduled    [provider]  zolpidem (AMBIEN) 10 MG tablet Take 10 mg by mouth at bedtime as  needed for sleep.     [provider]    Family History No family history on file.  Social History Social History  Substance Use Topics  . Smoking status: Former Smoker    Types: Cigarettes    Quit date: 05/13/1971  . Smokeless tobacco: Never Used  . Alcohol use Yes     Comment: occasional     Allergies   Amiodarone   Review of Systems Review of Systems  Constitutional: Positive for fatigue.  HENT: Negative for congestion.   Eyes: Negative for visual disturbance.  Respiratory: Negative for shortness of breath.   Cardiovascular: Negative for chest pain.  Gastrointestinal: Negative for abdominal  pain.  Genitourinary: Negative for dysuria.  Musculoskeletal: Negative for back pain.  Neurological: Positive for weakness.  Hematological: Bruises/bleeds easily.  Psychiatric/Behavioral: Negative for confusion.     Physical Exam Updated Vital Signs BP (!) 152/70   Pulse 60   Temp 98.1 F (36.7 C) (Oral)   Resp 14   Ht 1.727 m (5\' 8" )   Wt 74.5 kg (164 lb 3 oz)   SpO2 100%   BMI 24.96 kg/m   Physical Exam  Constitutional: He is oriented to person, place, and time. He appears well-developed and well-nourished. No distress.  HENT:  Head: Normocephalic and atraumatic.  Mouth/Throat: Oropharynx is clear and moist.  Eyes: Pupils are equal, round, and reactive to light. Conjunctivae and EOM are normal.  Neck: Normal range of motion. Neck supple.  Cardiovascular: Normal rate, regular rhythm and normal heart sounds.   Pulmonary/Chest: Effort normal and breath sounds normal.  Abdominal: Soft. Bowel sounds are normal. There is no tenderness.  Musculoskeletal: Normal range of motion.  Neurological: He is alert and oriented to person, place, and time. No cranial nerve deficit or sensory deficit. He exhibits normal muscle tone. Coordination normal.  Skin: Skin is warm.  Nursing note and vitals reviewed.    ED Treatments / Results  Labs (all labs ordered are  listed, but only abnormal results are displayed) Labs Reviewed  BASIC METABOLIC PANEL - Abnormal; Notable for the following:       Result Value   Glucose, Bld 121 (*)    BUN 28 (*)    Creatinine, Ser 1.40 (*)    Calcium 8.8 (*)    GFR calc non Af Amer 43 (*)    GFR calc Af Amer 50 (*)    All other components within normal limits  CBC - Abnormal; Notable for the following:    RBC 3.49 (*)    Hemoglobin 10.7 (*)    HCT 33.4 (*)    Platelets 131 (*)    All other components within normal limits  URINALYSIS, ROUTINE W REFLEX MICROSCOPIC - Abnormal; Notable for the following:    Color, Urine STRAW (*)    Specific Gravity, Urine 1.003 (*)    Leukocytes, UA TRACE (*)    Bacteria, UA RARE (*)    All other components within normal limits  CBG MONITORING, ED    EKG  EKG Interpretation  Date/Time:  Wednesday April 01 2017 17:15:18 EDT Ventricular Rate:  60 PR Interval:    QRS Duration: 148 QT Interval:  470 QTC Calculation: 470 R Axis:   117 Text Interpretation:  Ventricular-paced rhythm Abnormal ECG Confirmed by Fredia Sorrow [phone]) on 04/01/2017 9:16:31 PM       Radiology No results found.  Procedures Procedures (including critical care time)  Medications Ordered in ED Medications - No data to display   Initial Impression / Assessment and Plan / ED Course  I have reviewed the triage vital signs and the nursing notes.  Pertinent labs & imaging results that were available during my care of the patient were reviewed by me and considered in my medical decision making (see chart for details).     In discussion with the friend of the patient patient's blood pressures were low. Appeared to be low when he first arrived. May have explained his symptoms of weakness at the doctor's office. Patient obviously missed his evening dose of his Coreg. Blood pressure here was systolic 093. Labs without significant changes. No walk patient here patient felt fine all symptoms  had  resolved. Patient does have the sclera some conjunctival hemorrhage around the left thigh. And some bleeding into the cheek area. Patient's blood thinner was stopped by the primary care doctor. Patient has follow-up with ophthalmology also gave him on call ophthalmology. Patient clinically stable for discharge home. Recommended he stop the his Coreg take his blood pressures daily. And follow back up with his primary care doctor. Patient was alert and oriented nontoxic no acute distress.  Final Clinical Impressions(s) / ED Diagnoses   Final diagnoses:  Weakness  Scleral hemorrhage of left eye    New Prescriptions Discharge Medication List as of 04/01/2017 10:05 PM       Fredia Sorrow, MD 04/02/17 0002

## 2017-04-02 DIAGNOSIS — S81812D Laceration without foreign body, left lower leg, subsequent encounter: Secondary | ICD-10-CM | POA: Diagnosis not present

## 2017-04-02 DIAGNOSIS — I110 Hypertensive heart disease with heart failure: Secondary | ICD-10-CM | POA: Diagnosis not present

## 2017-04-02 DIAGNOSIS — I509 Heart failure, unspecified: Secondary | ICD-10-CM | POA: Diagnosis not present

## 2017-04-02 DIAGNOSIS — G20 Parkinson's disease: Secondary | ICD-10-CM | POA: Diagnosis not present

## 2017-04-02 DIAGNOSIS — M419 Scoliosis, unspecified: Secondary | ICD-10-CM | POA: Diagnosis not present

## 2017-04-02 DIAGNOSIS — M4807 Spinal stenosis, lumbosacral region: Secondary | ICD-10-CM | POA: Diagnosis not present

## 2017-04-03 DIAGNOSIS — H1132 Conjunctival hemorrhage, left eye: Secondary | ICD-10-CM | POA: Diagnosis not present

## 2017-04-08 DIAGNOSIS — H1132 Conjunctival hemorrhage, left eye: Secondary | ICD-10-CM | POA: Diagnosis not present

## 2017-04-08 DIAGNOSIS — R5383 Other fatigue: Secondary | ICD-10-CM | POA: Diagnosis not present

## 2017-04-08 DIAGNOSIS — R42 Dizziness and giddiness: Secondary | ICD-10-CM | POA: Diagnosis not present

## 2017-04-08 DIAGNOSIS — R0609 Other forms of dyspnea: Secondary | ICD-10-CM | POA: Diagnosis not present

## 2017-04-08 DIAGNOSIS — Z23 Encounter for immunization: Secondary | ICD-10-CM | POA: Diagnosis not present

## 2017-04-09 DIAGNOSIS — M419 Scoliosis, unspecified: Secondary | ICD-10-CM | POA: Diagnosis not present

## 2017-04-09 DIAGNOSIS — I110 Hypertensive heart disease with heart failure: Secondary | ICD-10-CM | POA: Diagnosis not present

## 2017-04-09 DIAGNOSIS — I509 Heart failure, unspecified: Secondary | ICD-10-CM | POA: Diagnosis not present

## 2017-04-09 DIAGNOSIS — G20 Parkinson's disease: Secondary | ICD-10-CM | POA: Diagnosis not present

## 2017-04-09 DIAGNOSIS — S81812D Laceration without foreign body, left lower leg, subsequent encounter: Secondary | ICD-10-CM | POA: Diagnosis not present

## 2017-04-09 DIAGNOSIS — M4807 Spinal stenosis, lumbosacral region: Secondary | ICD-10-CM | POA: Diagnosis not present

## 2017-04-14 DIAGNOSIS — R1912 Hyperactive bowel sounds: Secondary | ICD-10-CM | POA: Diagnosis not present

## 2017-04-14 DIAGNOSIS — I4891 Unspecified atrial fibrillation: Secondary | ICD-10-CM | POA: Diagnosis not present

## 2017-04-14 DIAGNOSIS — C20 Malignant neoplasm of rectum: Secondary | ICD-10-CM | POA: Diagnosis not present

## 2017-04-14 DIAGNOSIS — G20 Parkinson's disease: Secondary | ICD-10-CM | POA: Diagnosis not present

## 2017-04-14 DIAGNOSIS — M419 Scoliosis, unspecified: Secondary | ICD-10-CM | POA: Diagnosis not present

## 2017-04-14 DIAGNOSIS — H3530 Unspecified macular degeneration: Secondary | ICD-10-CM | POA: Diagnosis not present

## 2017-04-14 DIAGNOSIS — S81812D Laceration without foreign body, left lower leg, subsequent encounter: Secondary | ICD-10-CM | POA: Diagnosis not present

## 2017-04-14 DIAGNOSIS — I110 Hypertensive heart disease with heart failure: Secondary | ICD-10-CM | POA: Diagnosis not present

## 2017-04-14 DIAGNOSIS — R498 Other voice and resonance disorders: Secondary | ICD-10-CM | POA: Diagnosis not present

## 2017-04-14 DIAGNOSIS — M4807 Spinal stenosis, lumbosacral region: Secondary | ICD-10-CM | POA: Diagnosis not present

## 2017-04-14 DIAGNOSIS — H918X1 Other specified hearing loss, right ear: Secondary | ICD-10-CM | POA: Diagnosis not present

## 2017-04-14 DIAGNOSIS — I509 Heart failure, unspecified: Secondary | ICD-10-CM | POA: Diagnosis not present

## 2017-04-14 DIAGNOSIS — I2510 Atherosclerotic heart disease of native coronary artery without angina pectoris: Secondary | ICD-10-CM | POA: Diagnosis not present

## 2017-04-16 ENCOUNTER — Ambulatory Visit (INDEPENDENT_AMBULATORY_CARE_PROVIDER_SITE_OTHER): Payer: Medicare Other | Admitting: *Deleted

## 2017-04-16 DIAGNOSIS — I110 Hypertensive heart disease with heart failure: Secondary | ICD-10-CM | POA: Diagnosis not present

## 2017-04-16 DIAGNOSIS — I495 Sick sinus syndrome: Secondary | ICD-10-CM

## 2017-04-16 DIAGNOSIS — M419 Scoliosis, unspecified: Secondary | ICD-10-CM | POA: Diagnosis not present

## 2017-04-16 DIAGNOSIS — G20 Parkinson's disease: Secondary | ICD-10-CM | POA: Diagnosis not present

## 2017-04-16 DIAGNOSIS — S81812D Laceration without foreign body, left lower leg, subsequent encounter: Secondary | ICD-10-CM | POA: Diagnosis not present

## 2017-04-16 DIAGNOSIS — I509 Heart failure, unspecified: Secondary | ICD-10-CM | POA: Diagnosis not present

## 2017-04-16 DIAGNOSIS — M4807 Spinal stenosis, lumbosacral region: Secondary | ICD-10-CM | POA: Diagnosis not present

## 2017-04-16 NOTE — Progress Notes (Signed)
Remote pacemaker transmission.   

## 2017-04-17 ENCOUNTER — Encounter: Payer: Self-pay | Admitting: Cardiology

## 2017-04-21 DIAGNOSIS — S91002A Unspecified open wound, left ankle, initial encounter: Secondary | ICD-10-CM | POA: Diagnosis not present

## 2017-04-21 DIAGNOSIS — I890 Lymphedema, not elsewhere classified: Secondary | ICD-10-CM | POA: Diagnosis not present

## 2017-04-21 DIAGNOSIS — Z86718 Personal history of other venous thrombosis and embolism: Secondary | ICD-10-CM | POA: Diagnosis not present

## 2017-04-21 DIAGNOSIS — Z872 Personal history of diseases of the skin and subcutaneous tissue: Secondary | ICD-10-CM | POA: Diagnosis not present

## 2017-04-21 DIAGNOSIS — Z950 Presence of cardiac pacemaker: Secondary | ICD-10-CM | POA: Diagnosis not present

## 2017-04-21 DIAGNOSIS — I482 Chronic atrial fibrillation: Secondary | ICD-10-CM | POA: Diagnosis not present

## 2017-04-21 DIAGNOSIS — I509 Heart failure, unspecified: Secondary | ICD-10-CM | POA: Diagnosis not present

## 2017-04-21 DIAGNOSIS — Z09 Encounter for follow-up examination after completed treatment for conditions other than malignant neoplasm: Secondary | ICD-10-CM | POA: Diagnosis not present

## 2017-04-21 DIAGNOSIS — I110 Hypertensive heart disease with heart failure: Secondary | ICD-10-CM | POA: Diagnosis not present

## 2017-04-21 DIAGNOSIS — G20 Parkinson's disease: Secondary | ICD-10-CM | POA: Diagnosis not present

## 2017-04-23 DIAGNOSIS — E785 Hyperlipidemia, unspecified: Secondary | ICD-10-CM | POA: Diagnosis not present

## 2017-04-23 DIAGNOSIS — R0609 Other forms of dyspnea: Secondary | ICD-10-CM | POA: Diagnosis not present

## 2017-04-23 DIAGNOSIS — I38 Endocarditis, valve unspecified: Secondary | ICD-10-CM | POA: Diagnosis not present

## 2017-04-23 DIAGNOSIS — I482 Chronic atrial fibrillation: Secondary | ICD-10-CM | POA: Diagnosis not present

## 2017-04-23 DIAGNOSIS — G20 Parkinson's disease: Secondary | ICD-10-CM | POA: Diagnosis not present

## 2017-04-23 DIAGNOSIS — R2681 Unsteadiness on feet: Secondary | ICD-10-CM | POA: Diagnosis not present

## 2017-04-23 DIAGNOSIS — R7309 Other abnormal glucose: Secondary | ICD-10-CM | POA: Diagnosis not present

## 2017-04-23 DIAGNOSIS — I639 Cerebral infarction, unspecified: Secondary | ICD-10-CM | POA: Diagnosis not present

## 2017-04-23 DIAGNOSIS — I119 Hypertensive heart disease without heart failure: Secondary | ICD-10-CM | POA: Diagnosis not present

## 2017-04-23 DIAGNOSIS — Z950 Presence of cardiac pacemaker: Secondary | ICD-10-CM | POA: Diagnosis not present

## 2017-04-23 DIAGNOSIS — I5020 Unspecified systolic (congestive) heart failure: Secondary | ICD-10-CM | POA: Diagnosis not present

## 2017-04-23 DIAGNOSIS — I495 Sick sinus syndrome: Secondary | ICD-10-CM | POA: Diagnosis not present

## 2017-04-28 DIAGNOSIS — I110 Hypertensive heart disease with heart failure: Secondary | ICD-10-CM | POA: Diagnosis not present

## 2017-04-28 DIAGNOSIS — S81812D Laceration without foreign body, left lower leg, subsequent encounter: Secondary | ICD-10-CM | POA: Diagnosis not present

## 2017-04-28 DIAGNOSIS — M419 Scoliosis, unspecified: Secondary | ICD-10-CM | POA: Diagnosis not present

## 2017-04-28 DIAGNOSIS — M4807 Spinal stenosis, lumbosacral region: Secondary | ICD-10-CM | POA: Diagnosis not present

## 2017-04-28 DIAGNOSIS — G20 Parkinson's disease: Secondary | ICD-10-CM | POA: Diagnosis not present

## 2017-04-28 DIAGNOSIS — I509 Heart failure, unspecified: Secondary | ICD-10-CM | POA: Diagnosis not present

## 2017-04-29 DIAGNOSIS — I482 Chronic atrial fibrillation: Secondary | ICD-10-CM | POA: Diagnosis not present

## 2017-04-29 DIAGNOSIS — R7309 Other abnormal glucose: Secondary | ICD-10-CM | POA: Diagnosis not present

## 2017-04-29 DIAGNOSIS — I5020 Unspecified systolic (congestive) heart failure: Secondary | ICD-10-CM | POA: Diagnosis not present

## 2017-04-29 DIAGNOSIS — I495 Sick sinus syndrome: Secondary | ICD-10-CM | POA: Diagnosis not present

## 2017-04-29 DIAGNOSIS — G20 Parkinson's disease: Secondary | ICD-10-CM | POA: Diagnosis not present

## 2017-04-29 DIAGNOSIS — I38 Endocarditis, valve unspecified: Secondary | ICD-10-CM | POA: Diagnosis not present

## 2017-04-29 DIAGNOSIS — Z950 Presence of cardiac pacemaker: Secondary | ICD-10-CM | POA: Diagnosis not present

## 2017-04-29 DIAGNOSIS — I119 Hypertensive heart disease without heart failure: Secondary | ICD-10-CM | POA: Diagnosis not present

## 2017-04-29 DIAGNOSIS — R0609 Other forms of dyspnea: Secondary | ICD-10-CM | POA: Diagnosis not present

## 2017-04-29 DIAGNOSIS — E785 Hyperlipidemia, unspecified: Secondary | ICD-10-CM | POA: Diagnosis not present

## 2017-04-29 DIAGNOSIS — I639 Cerebral infarction, unspecified: Secondary | ICD-10-CM | POA: Diagnosis not present

## 2017-05-08 DIAGNOSIS — I110 Hypertensive heart disease with heart failure: Secondary | ICD-10-CM | POA: Diagnosis not present

## 2017-05-08 DIAGNOSIS — I509 Heart failure, unspecified: Secondary | ICD-10-CM | POA: Diagnosis not present

## 2017-05-08 DIAGNOSIS — S81812D Laceration without foreign body, left lower leg, subsequent encounter: Secondary | ICD-10-CM | POA: Diagnosis not present

## 2017-05-08 DIAGNOSIS — G20 Parkinson's disease: Secondary | ICD-10-CM | POA: Diagnosis not present

## 2017-05-08 DIAGNOSIS — M4807 Spinal stenosis, lumbosacral region: Secondary | ICD-10-CM | POA: Diagnosis not present

## 2017-05-08 DIAGNOSIS — M419 Scoliosis, unspecified: Secondary | ICD-10-CM | POA: Diagnosis not present

## 2017-05-12 DIAGNOSIS — S81812D Laceration without foreign body, left lower leg, subsequent encounter: Secondary | ICD-10-CM | POA: Diagnosis not present

## 2017-05-12 DIAGNOSIS — I110 Hypertensive heart disease with heart failure: Secondary | ICD-10-CM | POA: Diagnosis not present

## 2017-05-12 DIAGNOSIS — M419 Scoliosis, unspecified: Secondary | ICD-10-CM | POA: Diagnosis not present

## 2017-05-12 DIAGNOSIS — M4807 Spinal stenosis, lumbosacral region: Secondary | ICD-10-CM | POA: Diagnosis not present

## 2017-05-12 DIAGNOSIS — G20 Parkinson's disease: Secondary | ICD-10-CM | POA: Diagnosis not present

## 2017-05-12 DIAGNOSIS — I509 Heart failure, unspecified: Secondary | ICD-10-CM | POA: Diagnosis not present

## 2017-05-12 LAB — CUP PACEART REMOTE DEVICE CHECK
Battery Remaining Longevity: 96 mo
Battery Remaining Percentage: 100 %
Brady Statistic RV Percent Paced: 70 %
Date Time Interrogation Session: 20180920044100
Implantable Lead Implant Date: 20000401
Implantable Lead Location: 753860
Implantable Lead Model: 4245
Implantable Lead Serial Number: 402472
Implantable Pulse Generator Implant Date: 20150505
Lead Channel Impedance Value: 451 Ohm
Lead Channel Pacing Threshold Amplitude: 0.8 V
Lead Channel Pacing Threshold Pulse Width: 0.4 ms
Lead Channel Setting Pacing Amplitude: 1.3 V
Lead Channel Setting Pacing Pulse Width: 0.4 ms
Lead Channel Setting Sensing Sensitivity: 2.5 mV
Pulse Gen Serial Number: 390662

## 2017-05-18 DIAGNOSIS — I509 Heart failure, unspecified: Secondary | ICD-10-CM | POA: Diagnosis not present

## 2017-05-18 DIAGNOSIS — S81812D Laceration without foreign body, left lower leg, subsequent encounter: Secondary | ICD-10-CM | POA: Diagnosis not present

## 2017-05-18 DIAGNOSIS — M4807 Spinal stenosis, lumbosacral region: Secondary | ICD-10-CM | POA: Diagnosis not present

## 2017-05-18 DIAGNOSIS — M419 Scoliosis, unspecified: Secondary | ICD-10-CM | POA: Diagnosis not present

## 2017-05-18 DIAGNOSIS — I110 Hypertensive heart disease with heart failure: Secondary | ICD-10-CM | POA: Diagnosis not present

## 2017-05-18 DIAGNOSIS — G20 Parkinson's disease: Secondary | ICD-10-CM | POA: Diagnosis not present

## 2017-05-25 DIAGNOSIS — I110 Hypertensive heart disease with heart failure: Secondary | ICD-10-CM | POA: Diagnosis not present

## 2017-05-25 DIAGNOSIS — G20 Parkinson's disease: Secondary | ICD-10-CM | POA: Diagnosis not present

## 2017-05-25 DIAGNOSIS — R32 Unspecified urinary incontinence: Secondary | ICD-10-CM | POA: Diagnosis not present

## 2017-05-25 DIAGNOSIS — I509 Heart failure, unspecified: Secondary | ICD-10-CM | POA: Diagnosis not present

## 2017-05-28 DIAGNOSIS — I110 Hypertensive heart disease with heart failure: Secondary | ICD-10-CM | POA: Diagnosis not present

## 2017-05-28 DIAGNOSIS — M4807 Spinal stenosis, lumbosacral region: Secondary | ICD-10-CM | POA: Diagnosis not present

## 2017-05-28 DIAGNOSIS — G20 Parkinson's disease: Secondary | ICD-10-CM | POA: Diagnosis not present

## 2017-05-28 DIAGNOSIS — M419 Scoliosis, unspecified: Secondary | ICD-10-CM | POA: Diagnosis not present

## 2017-05-28 DIAGNOSIS — S81812D Laceration without foreign body, left lower leg, subsequent encounter: Secondary | ICD-10-CM | POA: Diagnosis not present

## 2017-05-28 DIAGNOSIS — I509 Heart failure, unspecified: Secondary | ICD-10-CM | POA: Diagnosis not present

## 2017-06-04 DIAGNOSIS — M419 Scoliosis, unspecified: Secondary | ICD-10-CM | POA: Diagnosis not present

## 2017-06-04 DIAGNOSIS — G20 Parkinson's disease: Secondary | ICD-10-CM | POA: Diagnosis not present

## 2017-06-04 DIAGNOSIS — S81812D Laceration without foreign body, left lower leg, subsequent encounter: Secondary | ICD-10-CM | POA: Diagnosis not present

## 2017-06-04 DIAGNOSIS — I110 Hypertensive heart disease with heart failure: Secondary | ICD-10-CM | POA: Diagnosis not present

## 2017-06-04 DIAGNOSIS — M4807 Spinal stenosis, lumbosacral region: Secondary | ICD-10-CM | POA: Diagnosis not present

## 2017-06-04 DIAGNOSIS — I509 Heart failure, unspecified: Secondary | ICD-10-CM | POA: Diagnosis not present

## 2017-06-09 DIAGNOSIS — S81812D Laceration without foreign body, left lower leg, subsequent encounter: Secondary | ICD-10-CM | POA: Diagnosis not present

## 2017-06-09 DIAGNOSIS — G20 Parkinson's disease: Secondary | ICD-10-CM | POA: Diagnosis not present

## 2017-06-09 DIAGNOSIS — I509 Heart failure, unspecified: Secondary | ICD-10-CM | POA: Diagnosis not present

## 2017-06-09 DIAGNOSIS — I110 Hypertensive heart disease with heart failure: Secondary | ICD-10-CM | POA: Diagnosis not present

## 2017-06-09 DIAGNOSIS — M419 Scoliosis, unspecified: Secondary | ICD-10-CM | POA: Diagnosis not present

## 2017-06-09 DIAGNOSIS — M4807 Spinal stenosis, lumbosacral region: Secondary | ICD-10-CM | POA: Diagnosis not present

## 2017-06-11 ENCOUNTER — Ambulatory Visit: Payer: Medicare Other | Admitting: Neurology

## 2017-06-28 NOTE — Progress Notes (Signed)
Electrophysiology Office Note Date: 07/01/2017  ID:  [name], DOB 12/17/1926, MRN [id number]  PCP: Charolette Forward, MD Electrophysiologist: Caryl Comes  CC: Pacemaker follow-up  [name] is a 83 y.o. male seen today for Dr Caryl Comes.  He presents today for routine electrophysiology followup.  Since last being seen in our clinic, the patient reports doing relatively well.  He is staying as active as possible doing exercises 3 times per week.  He reports increased fatigue. He denies chest pain, palpitations, dyspnea, PND, orthopnea, nausea, vomiting, dizziness, syncope, edema, weight gain, or early satiety.  Device History: BSX single chamber PPM implanted 2000 for tachy/brady syndrome; gen change 2006; gen change 2015 (2 RV leads, no op notes available on when second added)    Past Medical History:  Diagnosis Date  . Arthritis    incl spinal stenosis  . Atrial fibrillation ([place])   . CHF (congestive heart failure) ([place])   . CHF (congestive heart failure) ([place])   . HTN (hypertension)   . Parkinson disease ([place])   . Renal insufficiency    question degree  . Wears glasses    reading  . Wears partial dentures    upper partial   Past Surgical History:  Procedure Laterality Date  . bilateral shoulder surgery    . EYE SURGERY     both catararcts  . I&D EXTREMITY  12/07/2011   Procedure: IRRIGATION AND DEBRIDEMENT EXTREMITY;  Surgeon: Tennis Must, MD;  Location: [facility] ORS;  Service: Orthopedics;  Laterality: Left;  . PACEMAKER GENERATOR CHANGE N/A 11/28/2013   Procedure: PACEMAKER GENERATOR CHANGE;  Surgeon: Deboraha Sprang, MD;  Location: [facility] CATH LAB;  Service: Cardiovascular;  Laterality: N/A;  . PACEMAKER INSERTION     BSX pacemaker generator change 11-28-2013 by Dr Caryl Comes  . prosthetic hip - bilaterally    . R knee quadricep tendon repair    . TONSILLECTOMY    . TRIGGER FINGER RELEASE Right 05/26/2013   Procedure: RIGHT SMALL TRIGGER RELEASE ;  Surgeon: Tennis Must, MD;  Location:  [facility];  Service: Orthopedics;  Laterality: Right;    Current Outpatient Medications  Medication Sig Dispense Refill  . acetaminophen (TYLENOL) 500 MG tablet Take 1,000 mg by mouth 2 (two) times daily. scheduled    . amoxicillin (AMOXIL) 500 MG capsule Take 500 mg by mouth See admin instructions. Take 4 capsules (2000 mg) by mouth prior to dental appointment    . Ascorbic Acid (VITAMIN C) 1000 MG tablet Take 1,000 mg by mouth daily.    Marland Kitchen b complex vitamins tablet Take 1 tablet by mouth daily.    . beta carotene w/minerals (OCUVITE) tablet Take 1 tablet by mouth 2 (two) times daily.    . bisacodyl (DULCOLAX) 5 MG EC tablet Take 10 mg by mouth daily as needed for moderate constipation.    Marland Kitchen CALCIUM CITRATE PO Take 1 tablet by mouth daily.     . carbidopa-levodopa (SINEMET IR) 25-100 MG tablet Take 0.5 tablets by mouth 3 (three) times daily. 45 tablet 5  . carvedilol (COREG) 6.25 MG tablet Take 1 tablet (6.25 mg total) by mouth 2 (two) times daily with a meal. 60 tablet 3  . Cascara Sagrada 450 MG CAPS Take 450 mg by mouth daily as needed (constipation).    . Cholecalciferol (VITAMIN D-3) 5000 UNITS TABS Take 5,000 Units by mouth daily.    . cyclobenzaprine (FLEXERIL) 10 MG tablet Take 1 tablet (10 mg total) by mouth 2 (  two) times daily as needed for muscle spasms. 20 tablet 0  . docusate sodium (COLACE) 100 MG capsule Take 100 mg by mouth daily.    . finasteride (PROSCAR) 5 MG tablet Take 5 mg by mouth daily.     . fluticasone (VERAMYST) 27.5 MCG/SPRAY nasal spray Place 2 sprays into the nose daily.    . furosemide (LASIX) 20 MG tablet Take 20 mg by mouth daily.    Marland Kitchen HYDROcodone-acetaminophen (NORCO/VICODIN) 5-325 MG per tablet Take 1-2 tablets by mouth every 4 (four) hours as needed for moderate pain or severe pain. 15 tablet 0  . LISINOPRIL PO Take 1 tablet by mouth daily. PT UNSURE OF DOSE    . Mouthwashes (BIOTENE DRY MOUTH MT) Use as directed 10 mLs in the mouth or throat  daily as needed (dry mouth).    . Multiple Vitamin (MULITIVITAMIN WITH MINERALS) TABS Take 1 tablet by mouth daily.     . Omega-3 Fatty Acids (FISH OIL) 1200 MG CAPS Take 2,400 mg by mouth 2 (two) times daily.    Marland Kitchen oxybutynin (DITROPAN-XL) 5 MG 24 hr tablet Take 5 mg by mouth at bedtime.    . polyvinyl alcohol (LIQUIFILM TEARS) 1.4 % ophthalmic solution Place 1 drop into both eyes 5 (five) times daily as needed for dry eyes.     . pravastatin (PRAVACHOL) 40 MG tablet Take 40 mg by mouth at bedtime.    . Rivaroxaban 15 & 20 MG TBPK Take 15-20 mg by mouth daily.    . sacubitril-valsartan (ENTRESTO) 24-26 MG Take 1 tablet by mouth 2 (two) times daily.    . Sodium Fluoride (FLUORIDEX DAILY DEFENSE DT) Place 1 application onto teeth daily.    . Tamsulosin HCl (FLOMAX) 0.4 MG CAPS Take 0.4 mg by mouth 2 (two) times daily.     . traMADol (ULTRAM) 50 MG tablet Take 100 mg by mouth 2 (two) times daily. scheduled    . zolpidem (AMBIEN) 10 MG tablet Take 10 mg by mouth at bedtime as needed for sleep.      No current facility-administered medications for this visit.     Allergies:   Amiodarone   Social History: Social History   Socioeconomic History  . Marital status: Widowed    Spouse name: Not on file  . Number of children: Not on file  . Years of education: Not on file  . Highest education level: Not on file  Social Needs  . Financial resource strain: Not on file  . Food insecurity - worry: Not on file  . Food insecurity - inability: Not on file  . Transportation needs - medical: Not on file  . Transportation needs - non-medical: Not on file  Occupational History  . Not on file  Tobacco Use  . Smoking status: Former Smoker    Types: Cigarettes    Last attempt to quit: 05/13/1971    Years since quitting: 46.1  . Smokeless tobacco: Never Used  Substance and Sexual Activity  . Alcohol use: Yes    Comment: occasional  . Drug use: No  . Sexual activity: Not on file  Other Topics  Concern  . Not on file  Social History Narrative  . Not on file    Review of Systems: All other systems reviewed and are otherwise negative except as noted above.   Physical Exam: VS:  BP (!) 132/58   Pulse 66   Ht 5\' 8"  (1.727 m)   Wt 158 lb (71.7 kg)  SpO2 98%   BMI 24.02 kg/m  , BMI Body mass index is 24.02 kg/m.  GEN- The patient is elderly appearing, alert and oriented x 3 today.   HEENT: normocephalic, atraumatic; sclera clear, conjunctiva pink; hearing intact; oropharynx clear; neck supple  Lungs- Clear to ausculation bilaterally, normal work of breathing.  No wheezes, rales, rhonchi Heart- Irregular rate and rhythm  GI- soft, non-tender, non-distended, bowel sounds present Extremities- no clubbing, cyanosis, or edema  MS- no significant deformity or atrophy Skin- warm and dry, no rash or lesion; PPM pocket well healed Psych- euthymic mood, full affect Neuro- strength and sensation are intact  PPM Interrogation- reviewed in detail today,  See PACEART report  EKG:  EKG is not ordered today.   Wt Readings from Last 3 Encounters:  07/01/17 158 lb (71.7 kg)  04/01/17 164 lb 3 oz (74.5 kg)  12/09/16 169 lb 9.6 oz (76.9 kg)     Other studies Reviewed: Additional studies/ records that were reviewed today include: Dr Olin Pia office notes  Assessment and Plan:  1.  Tachy/brady syndrome/permanent atrial fibrillation Normal PPM function See Pace Art report No changes today Continue Xarelto for CHADS2VASC of at least 3 - CBC, BMET followed by [facility]  2.  Fatigue/shortness of breath Will trial increased dose of Coreg for improved rate control (not terrible at baseline) I think likely multifactorial    Current medicines are reviewed at length with the patient today.   The patient does not have concerns regarding his medicines.  The following changes were made today:  none  Labs/ tests ordered today include: none   Disposition:   Follow up with [facility]  transmissions, Dr Caryl Comes in 1 year   Signed, Chanetta Marshall, NP 07/01/2017 12:02 PM  [facility] [address] [place] [address] [phone] (office) [phone] (fax)

## 2017-07-01 ENCOUNTER — Encounter: Payer: Self-pay | Admitting: Nurse Practitioner

## 2017-07-01 ENCOUNTER — Encounter: Payer: Medicare Other | Admitting: Nurse Practitioner

## 2017-07-01 ENCOUNTER — Ambulatory Visit (INDEPENDENT_AMBULATORY_CARE_PROVIDER_SITE_OTHER): Payer: Medicare Other | Admitting: Nurse Practitioner

## 2017-07-01 VITALS — BP 132/58 | HR 66 | Ht 68.0 in | Wt 158.0 lb

## 2017-07-01 DIAGNOSIS — I495 Sick sinus syndrome: Secondary | ICD-10-CM | POA: Diagnosis not present

## 2017-07-01 LAB — CUP PACEART INCLINIC DEVICE CHECK
Date Time Interrogation Session: 20181205121231
Implantable Lead Implant Date: 20000401
Implantable Lead Location: 753860
Implantable Lead Model: 4245
Implantable Lead Serial Number: 402472
Implantable Pulse Generator Implant Date: 20150505
Pulse Gen Serial Number: 390662

## 2017-07-01 MED ORDER — CARVEDILOL 6.25 MG PO TABS: 6.2500 mg | ORAL_TABLET | Freq: Two times a day (BID) | ORAL | 3 refills | Status: DC

## 2017-07-01 NOTE — Patient Instructions (Addendum)
Medication Instructions:   START TAKING COREG 6.25 MG TWICE A  DAY    If you need a refill on your cardiac medications before your next appointment, please call your pharmacy.  Labwork: NONE ORDERED  TODAY    Testing/Procedures:  NONE ORDERED  TODAY    Follow-Up: Your physician wants you to follow-up in: [place] will receive a reminder letter in the mail two months in advance. If you don't receive a letter, please call our office to schedule the follow-up appointment.   Remote monitoring is used to monitor your Pacemaker of ICD from home. This monitoring reduces the number of office visits required to check your device to one time per year. It allows [place] to keep an eye on the functioning of your device to ensure it is working properly. You have been scheduled for a device check from home on .You may send your transmission at any time that day. If you have a wireless device, the transmission will be sent automatically. After your physician reviews your transmission, you will receive a postcard with your next transmission date.    Any Other Special Instructions Will Be Listed Below (If Applicable).

## 2017-07-14 DIAGNOSIS — Z79899 Other long term (current) drug therapy: Secondary | ICD-10-CM | POA: Diagnosis not present

## 2017-07-14 DIAGNOSIS — G20 Parkinson's disease: Secondary | ICD-10-CM | POA: Diagnosis not present

## 2017-07-14 DIAGNOSIS — Z0000 Encounter for general adult medical examination without abnormal findings: Secondary | ICD-10-CM | POA: Diagnosis not present

## 2017-07-14 DIAGNOSIS — I2510 Atherosclerotic heart disease of native coronary artery without angina pectoris: Secondary | ICD-10-CM | POA: Diagnosis not present

## 2017-07-14 DIAGNOSIS — I509 Heart failure, unspecified: Secondary | ICD-10-CM | POA: Diagnosis not present

## 2017-07-14 DIAGNOSIS — I4891 Unspecified atrial fibrillation: Secondary | ICD-10-CM | POA: Diagnosis not present

## 2017-07-14 DIAGNOSIS — I110 Hypertensive heart disease with heart failure: Secondary | ICD-10-CM | POA: Diagnosis not present

## 2017-07-14 DIAGNOSIS — Z23 Encounter for immunization: Secondary | ICD-10-CM | POA: Diagnosis not present

## 2017-07-14 DIAGNOSIS — R32 Unspecified urinary incontinence: Secondary | ICD-10-CM | POA: Diagnosis not present

## 2017-07-14 DIAGNOSIS — Z1389 Encounter for screening for other disorder: Secondary | ICD-10-CM | POA: Diagnosis not present

## 2017-07-14 DIAGNOSIS — K5900 Constipation, unspecified: Secondary | ICD-10-CM | POA: Diagnosis not present

## 2017-07-16 ENCOUNTER — Ambulatory Visit (INDEPENDENT_AMBULATORY_CARE_PROVIDER_SITE_OTHER): Payer: Medicare Other | Admitting: *Deleted

## 2017-07-16 DIAGNOSIS — I495 Sick sinus syndrome: Secondary | ICD-10-CM

## 2017-07-16 NOTE — Progress Notes (Signed)
Remote pacemaker transmission.   

## 2017-07-17 ENCOUNTER — Encounter: Payer: Self-pay | Admitting: Cardiology

## 2017-07-23 DIAGNOSIS — R0609 Other forms of dyspnea: Secondary | ICD-10-CM | POA: Diagnosis not present

## 2017-07-23 DIAGNOSIS — I38 Endocarditis, valve unspecified: Secondary | ICD-10-CM | POA: Diagnosis not present

## 2017-07-23 DIAGNOSIS — I119 Hypertensive heart disease without heart failure: Secondary | ICD-10-CM | POA: Diagnosis not present

## 2017-07-23 DIAGNOSIS — M1990 Unspecified osteoarthritis, unspecified site: Secondary | ICD-10-CM | POA: Diagnosis not present

## 2017-07-23 DIAGNOSIS — R2681 Unsteadiness on feet: Secondary | ICD-10-CM | POA: Diagnosis not present

## 2017-07-23 DIAGNOSIS — I482 Chronic atrial fibrillation: Secondary | ICD-10-CM | POA: Diagnosis not present

## 2017-07-23 DIAGNOSIS — R7309 Other abnormal glucose: Secondary | ICD-10-CM | POA: Diagnosis not present

## 2017-07-23 DIAGNOSIS — I495 Sick sinus syndrome: Secondary | ICD-10-CM | POA: Diagnosis not present

## 2017-07-23 DIAGNOSIS — I639 Cerebral infarction, unspecified: Secondary | ICD-10-CM | POA: Diagnosis not present

## 2017-07-23 DIAGNOSIS — I5020 Unspecified systolic (congestive) heart failure: Secondary | ICD-10-CM | POA: Diagnosis not present

## 2017-07-23 DIAGNOSIS — E785 Hyperlipidemia, unspecified: Secondary | ICD-10-CM | POA: Diagnosis not present

## 2017-07-29 ENCOUNTER — Ambulatory Visit: Payer: Medicare Other | Admitting: Neurology

## 2017-07-29 LAB — CUP PACEART REMOTE DEVICE CHECK
Battery Remaining Longevity: 90 mo
Battery Remaining Percentage: 100 %
Brady Statistic RV Percent Paced: 71 %
Date Time Interrogation Session: 20181220054100
Implantable Lead Implant Date: 20000401
Implantable Lead Location: 753860
Implantable Lead Model: 4245
Implantable Lead Serial Number: 402472
Implantable Pulse Generator Implant Date: 20150505
Lead Channel Impedance Value: 439 Ohm
Lead Channel Pacing Threshold Amplitude: 0.8 V
Lead Channel Pacing Threshold Pulse Width: 0.4 ms
Lead Channel Setting Pacing Amplitude: 1.2 V
Lead Channel Setting Pacing Pulse Width: 0.4 ms
Lead Channel Setting Sensing Sensitivity: 2.5 mV
Pulse Gen Serial Number: 390662

## 2017-07-30 ENCOUNTER — Encounter: Payer: Self-pay | Admitting: Neurology

## 2017-08-19 DIAGNOSIS — I509 Heart failure, unspecified: Secondary | ICD-10-CM | POA: Diagnosis not present

## 2017-08-19 DIAGNOSIS — I110 Hypertensive heart disease with heart failure: Secondary | ICD-10-CM | POA: Diagnosis not present

## 2017-08-19 DIAGNOSIS — I4891 Unspecified atrial fibrillation: Secondary | ICD-10-CM | POA: Diagnosis not present

## 2017-08-19 DIAGNOSIS — Z86718 Personal history of other venous thrombosis and embolism: Secondary | ICD-10-CM | POA: Diagnosis not present

## 2017-08-19 DIAGNOSIS — M6281 Muscle weakness (generalized): Secondary | ICD-10-CM | POA: Diagnosis not present

## 2017-08-19 DIAGNOSIS — R260 Ataxic gait: Secondary | ICD-10-CM | POA: Diagnosis not present

## 2017-08-19 DIAGNOSIS — Z7901 Long term (current) use of anticoagulants: Secondary | ICD-10-CM | POA: Diagnosis not present

## 2017-08-19 DIAGNOSIS — M419 Scoliosis, unspecified: Secondary | ICD-10-CM | POA: Diagnosis not present

## 2017-08-19 DIAGNOSIS — R296 Repeated falls: Secondary | ICD-10-CM | POA: Diagnosis not present

## 2017-08-19 DIAGNOSIS — M4807 Spinal stenosis, lumbosacral region: Secondary | ICD-10-CM | POA: Diagnosis not present

## 2017-08-19 DIAGNOSIS — I2510 Atherosclerotic heart disease of native coronary artery without angina pectoris: Secondary | ICD-10-CM | POA: Diagnosis not present

## 2017-08-19 DIAGNOSIS — G20 Parkinson's disease: Secondary | ICD-10-CM | POA: Diagnosis not present

## 2017-08-20 DIAGNOSIS — G20 Parkinson's disease: Secondary | ICD-10-CM | POA: Diagnosis not present

## 2017-08-20 DIAGNOSIS — M419 Scoliosis, unspecified: Secondary | ICD-10-CM | POA: Diagnosis not present

## 2017-08-20 DIAGNOSIS — I2510 Atherosclerotic heart disease of native coronary artery without angina pectoris: Secondary | ICD-10-CM | POA: Diagnosis not present

## 2017-08-20 DIAGNOSIS — I509 Heart failure, unspecified: Secondary | ICD-10-CM | POA: Diagnosis not present

## 2017-08-20 DIAGNOSIS — M4807 Spinal stenosis, lumbosacral region: Secondary | ICD-10-CM | POA: Diagnosis not present

## 2017-08-20 DIAGNOSIS — I110 Hypertensive heart disease with heart failure: Secondary | ICD-10-CM | POA: Diagnosis not present

## 2017-08-24 DIAGNOSIS — I110 Hypertensive heart disease with heart failure: Secondary | ICD-10-CM | POA: Diagnosis not present

## 2017-08-24 DIAGNOSIS — I2510 Atherosclerotic heart disease of native coronary artery without angina pectoris: Secondary | ICD-10-CM | POA: Diagnosis not present

## 2017-08-24 DIAGNOSIS — I509 Heart failure, unspecified: Secondary | ICD-10-CM | POA: Diagnosis not present

## 2017-08-24 DIAGNOSIS — M4807 Spinal stenosis, lumbosacral region: Secondary | ICD-10-CM | POA: Diagnosis not present

## 2017-08-24 DIAGNOSIS — G20 Parkinson's disease: Secondary | ICD-10-CM | POA: Diagnosis not present

## 2017-08-24 DIAGNOSIS — M419 Scoliosis, unspecified: Secondary | ICD-10-CM | POA: Diagnosis not present

## 2017-08-26 DIAGNOSIS — I2510 Atherosclerotic heart disease of native coronary artery without angina pectoris: Secondary | ICD-10-CM | POA: Diagnosis not present

## 2017-08-26 DIAGNOSIS — I509 Heart failure, unspecified: Secondary | ICD-10-CM | POA: Diagnosis not present

## 2017-08-26 DIAGNOSIS — M4807 Spinal stenosis, lumbosacral region: Secondary | ICD-10-CM | POA: Diagnosis not present

## 2017-08-26 DIAGNOSIS — I110 Hypertensive heart disease with heart failure: Secondary | ICD-10-CM | POA: Diagnosis not present

## 2017-08-26 DIAGNOSIS — G20 Parkinson's disease: Secondary | ICD-10-CM | POA: Diagnosis not present

## 2017-08-26 DIAGNOSIS — M419 Scoliosis, unspecified: Secondary | ICD-10-CM | POA: Diagnosis not present

## 2017-08-27 ENCOUNTER — Emergency Department (HOSPITAL_COMMUNITY)
Admission: EM | Admit: 2017-08-27 | Discharge: 2017-08-27 | Disposition: A | Discharge status: Home / Self Care | Payer: Medicare Other | Attending: Emergency Medicine | Admitting: Emergency Medicine

## 2017-08-27 ENCOUNTER — Encounter (HOSPITAL_COMMUNITY): Payer: Self-pay | Admitting: Emergency Medicine

## 2017-08-27 ENCOUNTER — Emergency Department (HOSPITAL_COMMUNITY): Payer: Medicare Other

## 2017-08-27 DIAGNOSIS — S0993XA Unspecified injury of face, initial encounter: Secondary | ICD-10-CM | POA: Diagnosis not present

## 2017-08-27 DIAGNOSIS — Z79899 Other long term (current) drug therapy: Secondary | ICD-10-CM | POA: Diagnosis not present

## 2017-08-27 DIAGNOSIS — W228XXA Striking against or struck by other objects, initial encounter: Secondary | ICD-10-CM | POA: Insufficient documentation

## 2017-08-27 DIAGNOSIS — Y92003 Bedroom of unspecified non-institutional (private) residence as the place of occurrence of the external cause: Secondary | ICD-10-CM | POA: Diagnosis not present

## 2017-08-27 DIAGNOSIS — Y999 Unspecified external cause status: Secondary | ICD-10-CM | POA: Diagnosis not present

## 2017-08-27 DIAGNOSIS — S0990XA Unspecified injury of head, initial encounter: Secondary | ICD-10-CM | POA: Diagnosis not present

## 2017-08-27 DIAGNOSIS — R260 Ataxic gait: Secondary | ICD-10-CM | POA: Diagnosis not present

## 2017-08-27 DIAGNOSIS — S0101XA Laceration without foreign body of scalp, initial encounter: Secondary | ICD-10-CM | POA: Diagnosis not present

## 2017-08-27 DIAGNOSIS — Z87891 Personal history of nicotine dependence: Secondary | ICD-10-CM | POA: Insufficient documentation

## 2017-08-27 DIAGNOSIS — G20 Parkinson's disease: Secondary | ICD-10-CM | POA: Insufficient documentation

## 2017-08-27 DIAGNOSIS — I509 Heart failure, unspecified: Secondary | ICD-10-CM | POA: Insufficient documentation

## 2017-08-27 DIAGNOSIS — Z950 Presence of cardiac pacemaker: Secondary | ICD-10-CM | POA: Insufficient documentation

## 2017-08-27 DIAGNOSIS — I110 Hypertensive heart disease with heart failure: Secondary | ICD-10-CM | POA: Insufficient documentation

## 2017-08-27 DIAGNOSIS — Z7901 Long term (current) use of anticoagulants: Secondary | ICD-10-CM | POA: Diagnosis not present

## 2017-08-27 DIAGNOSIS — Y939 Activity, unspecified: Secondary | ICD-10-CM | POA: Diagnosis not present

## 2017-08-27 DIAGNOSIS — W19XXXA Unspecified fall, initial encounter: Secondary | ICD-10-CM

## 2017-08-27 DIAGNOSIS — S0191XA Laceration without foreign body of unspecified part of head, initial encounter: Secondary | ICD-10-CM | POA: Diagnosis not present

## 2017-08-27 MED ORDER — LIDOCAINE HCL 2 % IJ SOLN
20.0000 mL | Freq: Once | INTRAMUSCULAR | Status: AC
  Administered 2017-08-27: 400 mg via INTRADERMAL
  Filled 2017-08-27: qty 20

## 2017-08-27 NOTE — ED Notes (Signed)
Ptar at bedside  

## 2017-08-27 NOTE — ED Triage Notes (Addendum)
Patient coming from home after a fall last night at 10 pm. Patient states he fell and hit his head on his dresser. Denies LOC. Alert and oriented x4. Patient on xarelto. Denies pain or dizziness. Pupils with ems were pinpoint and non reactive. Patient does have history eye degeneration

## 2017-08-27 NOTE — ED Provider Notes (Signed)
[facility] EMERGENCY DEPARTMENT Provider Note   CSN: [id number] Arrival date & time: 08/27/17  1138     History   Chief Complaint Chief Complaint  Patient presents with  . Fall    HPI [name] is a 84 y.o. male.  HPI Patient presents to the emergency department with injuries following a fall that occurred last night around 10 PM the patient states that he was at his dresser when he fell forward hitting his head on the handle.  Patient states that he had a laceration to his head and he applied pressure and clean the blood off.  Patient states that nothing seemed to make the condition better or worse.  He states that he does not have any visual changes numbness or weakness.  Patient denies any chest pain shortness of breath, nausea, vomiting, neck pain, back pain, hip pain, near-syncope or syncope.  She states he has no other injuries noted on exam. Past Medical History:  Diagnosis Date  . Arthritis    incl spinal stenosis  . Atrial fibrillation ([place])   . CHF (congestive heart failure) ([place])   . CHF (congestive heart failure) ([place])   . HTN (hypertension)   . Parkinson disease ([place])   . Renal insufficiency    question degree  . Wears glasses    reading  . Wears partial dentures    upper partial    Patient Active Problem List   Diagnosis Date Noted  . CHF (congestive heart failure) ([place])   . Hypertension 09/17/2012  . Pneumonia 12/03/2011  . Syncope 10/13/2011  . ATRIAL FIBRILLATION 02/19/2010  . RENAL INSUFFICIENCY 02/19/2010  . PACEMAKER, single [employer] 02/19/2010    Past Surgical History:  Procedure Laterality Date  . bilateral shoulder surgery    . EYE SURGERY     both catararcts  . I&D EXTREMITY  12/07/2011   Procedure: IRRIGATION AND DEBRIDEMENT EXTREMITY;  Surgeon: Tennis Must, MD;  Location: [facility];  Service: Orthopedics;  Laterality: Left;  . PACEMAKER GENERATOR CHANGE N/A 11/28/2013   Procedure: PACEMAKER  GENERATOR CHANGE;  Surgeon: Deboraha Sprang, MD;  Location: [facility] CATH LAB;  Service: Cardiovascular;  Laterality: N/A;  . PACEMAKER INSERTION     BSX pacemaker generator change 11-28-2013 by Dr Caryl Comes  . prosthetic hip - bilaterally    . R knee quadricep tendon repair    . TONSILLECTOMY    . TRIGGER FINGER RELEASE Right 05/26/2013   Procedure: RIGHT SMALL TRIGGER RELEASE ;  Surgeon: Tennis Must, MD;  Location: [facility];  Service: Orthopedics;  Laterality: Right;       Home Medications    Prior to Admission medications   Medication Sig Start Date End Date Taking? Authorizing Provider  acetaminophen (TYLENOL) 500 MG tablet Take 1,000 mg by mouth 2 (two) times daily. scheduled    [provider]  amoxicillin (AMOXIL) 500 MG capsule Take 500 mg by mouth See admin instructions. Take 4 capsules (2000 mg) by mouth prior to dental appointment    [provider]  Ascorbic Acid (VITAMIN C) 1000 MG tablet Take 1,000 mg by mouth daily.    [provider]  b complex vitamins tablet Take 1 tablet by mouth daily.    [provider]  beta carotene w/minerals (OCUVITE) tablet Take 1 tablet by mouth 2 (two) times daily.    [provider]  bisacodyl (DULCOLAX) 5 MG EC tablet Take 10 mg by mouth daily as needed for  moderate constipation.    [provider]  CALCIUM CITRATE PO Take 1 tablet by mouth daily.     [provider]  carbidopa-levodopa (SINEMET IR) 25-100 MG tablet Take 0.5 tablets by mouth 3 (three) times daily. 12/09/16   Ward Givens, NP  carvedilol (COREG) 6.25 MG tablet Take 1 tablet (6.25 mg total) by mouth 2 (two) times daily with a meal. 07/01/17   Seiler, Amber K, NP  [place] 450 MG CAPS Take 450 mg by mouth daily as needed (constipation).    [provider]  Cholecalciferol (VITAMIN D-3) 5000 UNITS TABS Take 5,000 Units by mouth daily.    [provider]  cyclobenzaprine (FLEXERIL) 10 MG  tablet Take 1 tablet (10 mg total) by mouth 2 (two) times daily as needed for muscle spasms. 06/18/14   Szekalski, Kaitlyn, [name]  docusate sodium (COLACE) 100 MG capsule Take 100 mg by mouth daily. 07/22/16   [provider]  finasteride (PROSCAR) 5 MG tablet Take 5 mg by mouth daily.     [provider]  fluticasone (VERAMYST) 27.5 MCG/SPRAY nasal spray Place 2 sprays into the nose daily.    [provider]  furosemide (LASIX) 20 MG tablet Take 20 mg by mouth daily.    [provider]  HYDROcodone-acetaminophen (NORCO/VICODIN) 5-325 MG per tablet Take 1-2 tablets by mouth every 4 (four) hours as needed for moderate pain or severe pain. 06/18/14   [name], [name]  LISINOPRIL PO Take 1 tablet by mouth daily. PT UNSURE OF DOSE    [provider]  Mouthwashes (BIOTENE DRY MOUTH MT) Use as directed 10 mLs in the mouth or throat daily as needed (dry mouth).    [provider]  Multiple Vitamin (MULITIVITAMIN WITH MINERALS) TABS Take 1 tablet by mouth daily.     [provider]  Omega-3 Fatty Acids (FISH OIL) 1200 MG CAPS Take 2,400 mg by mouth 2 (two) times daily.    [provider]  oxybutynin (DITROPAN-XL) 5 MG 24 hr tablet Take 5 mg by mouth at bedtime.    [provider]  polyvinyl alcohol (LIQUIFILM TEARS) 1.4 % ophthalmic solution Place 1 drop into both eyes 5 (five) times daily as needed for dry eyes.     [provider]  pravastatin (PRAVACHOL) 40 MG tablet Take 40 mg by mouth at bedtime.    [provider]  Rivaroxaban 15 & 20 MG TBPK Take 15-20 mg by mouth daily. 01/27/17   [provider]  sacubitril-valsartan (ENTRESTO) 24-26 MG Take 1 tablet by mouth 2 (two) times daily.    [provider]  Sodium Fluoride (FLUORIDEX DAILY DEFENSE DT) Place 1 application onto teeth daily.    [provider]  Tamsulosin HCl (FLOMAX) 0.4 MG CAPS Take 0.4 mg by mouth 2 (two) times  daily.     [provider]  traMADol (ULTRAM) 50 MG tablet Take 100 mg by mouth 2 (two) times daily. scheduled    [provider]  zolpidem (AMBIEN) 10 MG tablet Take 10 mg by mouth at bedtime as needed for sleep.     [provider]  buPROPion (WELLBUTRIN) 100 MG tablet Take 200 mg by mouth daily.    10/13/11  [provider]  warfarin (COUMADIN) 2 MG tablet Take 2 mg by mouth daily. 1 tablet on Sunday, Tuesday, Wednesday, Friday, and Saturday 1/2 tablet on Monday and Thursday  10/20/11  [provider]    Family History No family  history on file.  Social History Social History   Tobacco Use  . Smoking status: Former Smoker    Types: Cigarettes    Last attempt to quit: 05/13/1971    Years since quitting: 46.3  . Smokeless tobacco: Never Used  Substance Use Topics  . Alcohol use: No    Frequency: Never    Comment: occasional  . Drug use: No     Allergies   Amiodarone   Review of Systems Review of Systems All other systems negative except as documented in the HPI. All pertinent positives and negatives as reviewed in the HPI.  Physical Exam Updated Vital Signs BP (!) 142/72   Pulse 68   Temp 98.3 F (36.8 C) (Oral)   Resp 15   Ht 5\' 8"  (1.727 m)   Wt 68 kg (150 lb)   SpO2 97%   BMI 22.81 kg/m   Physical Exam  Constitutional: He is oriented to person, place, and time. He appears well-developed and well-nourished. No distress.  HENT:  Head: Normocephalic.    Mouth/Throat: Oropharynx is clear and moist.  Eyes: Pupils are equal, round, and reactive to light.  Neck: Normal range of motion. Neck supple.  Cardiovascular: Normal rate, regular rhythm and normal heart sounds. Exam reveals no gallop and no friction rub.  No murmur heard. Pulmonary/Chest: Effort normal and breath sounds normal. No respiratory distress. He has no wheezes.  Musculoskeletal:       Right hip: Normal.       Left hip: Normal.       Right knee:  Normal.       Left knee: Normal.       Cervical back: Normal.       Thoracic back: Normal.       Lumbar back: Normal.  Neurological: He is alert and oriented to person, place, and time. He exhibits normal muscle tone. Coordination normal.  Skin: Skin is warm and dry. Capillary refill takes less than 2 seconds. No rash noted. No erythema.  Psychiatric: He has a normal mood and affect. His behavior is normal.  Nursing note and vitals reviewed.    ED Treatments / Results  Labs (all labs ordered are listed, but only abnormal results are displayed) Labs Reviewed - No data to display  EKG  EKG Interpretation None       Radiology Ct Head Wo Contrast  Result Date: 08/27/2017 CLINICAL DATA:  85 year old status post fall. Hit right-side of head EXAM: CT HEAD WITHOUT CONTRAST TECHNIQUE: Contiguous axial images were obtained from the base of the skull through the vertex without intravenous contrast. COMPARISON:  02/19/2017 FINDINGS: Brain: There is mild diffuse low-attenuation within the subcortical and periventricular white matter compatible with chronic microvascular disease. Prominence of the sulci and ventricles identified compatible with brain atrophy. No evidence for acute intracranial hemorrhage, brain infarct, mass or mass effect. Vascular: No hyperdense vessel or unexpected calcification. Skull: Normal. Negative for fracture or focal lesion. Sinuses/Orbits: The paranasal sinuses and mastoid air cells are clear. Other: None IMPRESSION: 1. No acute intracranial abnormality. 2. Chronic small vessel ischemic change and brain atrophy. Electronically Signed   By: Kerby Moors M.D.   On: 08/27/2017 13:43    Procedures Procedures (including critical care time)  Medications Ordered in ED Medications  lidocaine (XYLOCAINE) 2 % (with pres) injection 400 mg (not administered)     Initial Impression / Assessment and Plan / ED Course  I have reviewed the triage vital signs and the nursing  notes.  Pertinent labs & imaging results that were available during my care of the patient were reviewed by me and considered in my medical decision making (see chart for details).   Patient is neurologically intact and no neuro deficits noted on exam.  Patient is advised to have sutures out in 7-10 days.  LACERATION REPAIR Performed by: Brent General Authorized by: Resa Miner Jenisa Monty Consent: Verbal consent obtained. Risks and benefits: risks, benefits and alternatives were discussed Consent given by: patient Patient identity confirmed: provided demographic data Prepped and Draped in normal sterile fashion Wound explored  Laceration Location: R lateral anterior scalp  Laceration Length: 4cm  No Foreign Bodies seen or palpated  Anesthesia: local infiltration  Local anesthetic: lidocaine 2% wo epinephrine  Anesthetic total: 5 ml  Irrigation method: syringe Amount of cleaning: standard  Skin closure: 4-0 Prolene  Number of sutures: 7  Technique: 6 simple interrupted and 1 corner stitch  Patient tolerance: Patient tolerated the procedure well with no immediate complications.   Final Clinical Impressions(s) / ED Diagnoses   Final diagnoses:  None    ED Discharge Orders    None       Dalia Heading, [name] 08/27/17 [place], MD 08/27/17 [phone]

## 2017-08-27 NOTE — Discharge Instructions (Signed)
Have sutures out in 7-10 days.  Return here as needed.  Keep the area clean and dry.

## 2017-08-28 DIAGNOSIS — I509 Heart failure, unspecified: Secondary | ICD-10-CM | POA: Diagnosis not present

## 2017-08-28 DIAGNOSIS — M4807 Spinal stenosis, lumbosacral region: Secondary | ICD-10-CM | POA: Diagnosis not present

## 2017-08-28 DIAGNOSIS — I2510 Atherosclerotic heart disease of native coronary artery without angina pectoris: Secondary | ICD-10-CM | POA: Diagnosis not present

## 2017-08-28 DIAGNOSIS — G20 Parkinson's disease: Secondary | ICD-10-CM | POA: Diagnosis not present

## 2017-08-28 DIAGNOSIS — M419 Scoliosis, unspecified: Secondary | ICD-10-CM | POA: Diagnosis not present

## 2017-08-28 DIAGNOSIS — I110 Hypertensive heart disease with heart failure: Secondary | ICD-10-CM | POA: Diagnosis not present

## 2017-08-31 DIAGNOSIS — I110 Hypertensive heart disease with heart failure: Secondary | ICD-10-CM | POA: Diagnosis not present

## 2017-08-31 DIAGNOSIS — M4807 Spinal stenosis, lumbosacral region: Secondary | ICD-10-CM | POA: Diagnosis not present

## 2017-08-31 DIAGNOSIS — I2510 Atherosclerotic heart disease of native coronary artery without angina pectoris: Secondary | ICD-10-CM | POA: Diagnosis not present

## 2017-08-31 DIAGNOSIS — I509 Heart failure, unspecified: Secondary | ICD-10-CM | POA: Diagnosis not present

## 2017-08-31 DIAGNOSIS — M419 Scoliosis, unspecified: Secondary | ICD-10-CM | POA: Diagnosis not present

## 2017-08-31 DIAGNOSIS — G20 Parkinson's disease: Secondary | ICD-10-CM | POA: Diagnosis not present

## 2017-09-01 DIAGNOSIS — M4807 Spinal stenosis, lumbosacral region: Secondary | ICD-10-CM | POA: Diagnosis not present

## 2017-09-01 DIAGNOSIS — G20 Parkinson's disease: Secondary | ICD-10-CM | POA: Diagnosis not present

## 2017-09-01 DIAGNOSIS — M419 Scoliosis, unspecified: Secondary | ICD-10-CM | POA: Diagnosis not present

## 2017-09-01 DIAGNOSIS — I2510 Atherosclerotic heart disease of native coronary artery without angina pectoris: Secondary | ICD-10-CM | POA: Diagnosis not present

## 2017-09-01 DIAGNOSIS — I110 Hypertensive heart disease with heart failure: Secondary | ICD-10-CM | POA: Diagnosis not present

## 2017-09-01 DIAGNOSIS — I509 Heart failure, unspecified: Secondary | ICD-10-CM | POA: Diagnosis not present

## 2017-09-02 DIAGNOSIS — I110 Hypertensive heart disease with heart failure: Secondary | ICD-10-CM | POA: Diagnosis not present

## 2017-09-02 DIAGNOSIS — I509 Heart failure, unspecified: Secondary | ICD-10-CM | POA: Diagnosis not present

## 2017-09-02 DIAGNOSIS — M4807 Spinal stenosis, lumbosacral region: Secondary | ICD-10-CM | POA: Diagnosis not present

## 2017-09-02 DIAGNOSIS — I2510 Atherosclerotic heart disease of native coronary artery without angina pectoris: Secondary | ICD-10-CM | POA: Diagnosis not present

## 2017-09-02 DIAGNOSIS — G20 Parkinson's disease: Secondary | ICD-10-CM | POA: Diagnosis not present

## 2017-09-02 DIAGNOSIS — M419 Scoliosis, unspecified: Secondary | ICD-10-CM | POA: Diagnosis not present

## 2017-09-03 DIAGNOSIS — I2510 Atherosclerotic heart disease of native coronary artery without angina pectoris: Secondary | ICD-10-CM | POA: Diagnosis not present

## 2017-09-03 DIAGNOSIS — M4807 Spinal stenosis, lumbosacral region: Secondary | ICD-10-CM | POA: Diagnosis not present

## 2017-09-03 DIAGNOSIS — I110 Hypertensive heart disease with heart failure: Secondary | ICD-10-CM | POA: Diagnosis not present

## 2017-09-03 DIAGNOSIS — M419 Scoliosis, unspecified: Secondary | ICD-10-CM | POA: Diagnosis not present

## 2017-09-03 DIAGNOSIS — G20 Parkinson's disease: Secondary | ICD-10-CM | POA: Diagnosis not present

## 2017-09-03 DIAGNOSIS — I509 Heart failure, unspecified: Secondary | ICD-10-CM | POA: Diagnosis not present

## 2017-09-04 DIAGNOSIS — I110 Hypertensive heart disease with heart failure: Secondary | ICD-10-CM | POA: Diagnosis not present

## 2017-09-04 DIAGNOSIS — M4807 Spinal stenosis, lumbosacral region: Secondary | ICD-10-CM | POA: Diagnosis not present

## 2017-09-04 DIAGNOSIS — M419 Scoliosis, unspecified: Secondary | ICD-10-CM | POA: Diagnosis not present

## 2017-09-04 DIAGNOSIS — G20 Parkinson's disease: Secondary | ICD-10-CM | POA: Diagnosis not present

## 2017-09-04 DIAGNOSIS — I2510 Atherosclerotic heart disease of native coronary artery without angina pectoris: Secondary | ICD-10-CM | POA: Diagnosis not present

## 2017-09-04 DIAGNOSIS — I509 Heart failure, unspecified: Secondary | ICD-10-CM | POA: Diagnosis not present

## 2017-09-07 DIAGNOSIS — I110 Hypertensive heart disease with heart failure: Secondary | ICD-10-CM | POA: Diagnosis not present

## 2017-09-07 DIAGNOSIS — I509 Heart failure, unspecified: Secondary | ICD-10-CM | POA: Diagnosis not present

## 2017-09-07 DIAGNOSIS — M419 Scoliosis, unspecified: Secondary | ICD-10-CM | POA: Diagnosis not present

## 2017-09-07 DIAGNOSIS — I2510 Atherosclerotic heart disease of native coronary artery without angina pectoris: Secondary | ICD-10-CM | POA: Diagnosis not present

## 2017-09-07 DIAGNOSIS — G20 Parkinson's disease: Secondary | ICD-10-CM | POA: Diagnosis not present

## 2017-09-07 DIAGNOSIS — M4807 Spinal stenosis, lumbosacral region: Secondary | ICD-10-CM | POA: Diagnosis not present

## 2017-09-08 DIAGNOSIS — G20 Parkinson's disease: Secondary | ICD-10-CM | POA: Diagnosis not present

## 2017-09-08 DIAGNOSIS — M419 Scoliosis, unspecified: Secondary | ICD-10-CM | POA: Diagnosis not present

## 2017-09-08 DIAGNOSIS — Z7901 Long term (current) use of anticoagulants: Secondary | ICD-10-CM | POA: Diagnosis not present

## 2017-09-08 DIAGNOSIS — R260 Ataxic gait: Secondary | ICD-10-CM | POA: Diagnosis not present

## 2017-09-08 DIAGNOSIS — M4807 Spinal stenosis, lumbosacral region: Secondary | ICD-10-CM | POA: Diagnosis not present

## 2017-09-08 DIAGNOSIS — I509 Heart failure, unspecified: Secondary | ICD-10-CM | POA: Diagnosis not present

## 2017-09-08 DIAGNOSIS — Z86718 Personal history of other venous thrombosis and embolism: Secondary | ICD-10-CM | POA: Diagnosis not present

## 2017-09-08 DIAGNOSIS — I2510 Atherosclerotic heart disease of native coronary artery without angina pectoris: Secondary | ICD-10-CM | POA: Diagnosis not present

## 2017-09-08 DIAGNOSIS — I4891 Unspecified atrial fibrillation: Secondary | ICD-10-CM | POA: Diagnosis not present

## 2017-09-08 DIAGNOSIS — R296 Repeated falls: Secondary | ICD-10-CM | POA: Diagnosis not present

## 2017-09-08 DIAGNOSIS — I110 Hypertensive heart disease with heart failure: Secondary | ICD-10-CM | POA: Diagnosis not present

## 2017-09-09 DIAGNOSIS — G20 Parkinson's disease: Secondary | ICD-10-CM | POA: Diagnosis not present

## 2017-09-09 DIAGNOSIS — M419 Scoliosis, unspecified: Secondary | ICD-10-CM | POA: Diagnosis not present

## 2017-09-09 DIAGNOSIS — I509 Heart failure, unspecified: Secondary | ICD-10-CM | POA: Diagnosis not present

## 2017-09-09 DIAGNOSIS — M4807 Spinal stenosis, lumbosacral region: Secondary | ICD-10-CM | POA: Diagnosis not present

## 2017-09-09 DIAGNOSIS — I110 Hypertensive heart disease with heart failure: Secondary | ICD-10-CM | POA: Diagnosis not present

## 2017-09-09 DIAGNOSIS — I2510 Atherosclerotic heart disease of native coronary artery without angina pectoris: Secondary | ICD-10-CM | POA: Diagnosis not present

## 2017-09-10 DIAGNOSIS — G20 Parkinson's disease: Secondary | ICD-10-CM | POA: Diagnosis not present

## 2017-09-10 DIAGNOSIS — M419 Scoliosis, unspecified: Secondary | ICD-10-CM | POA: Diagnosis not present

## 2017-09-10 DIAGNOSIS — M4807 Spinal stenosis, lumbosacral region: Secondary | ICD-10-CM | POA: Diagnosis not present

## 2017-09-10 DIAGNOSIS — I110 Hypertensive heart disease with heart failure: Secondary | ICD-10-CM | POA: Diagnosis not present

## 2017-09-10 DIAGNOSIS — I2510 Atherosclerotic heart disease of native coronary artery without angina pectoris: Secondary | ICD-10-CM | POA: Diagnosis not present

## 2017-09-10 DIAGNOSIS — I509 Heart failure, unspecified: Secondary | ICD-10-CM | POA: Diagnosis not present

## 2017-09-14 DIAGNOSIS — I509 Heart failure, unspecified: Secondary | ICD-10-CM | POA: Diagnosis not present

## 2017-09-14 DIAGNOSIS — I110 Hypertensive heart disease with heart failure: Secondary | ICD-10-CM | POA: Diagnosis not present

## 2017-09-14 DIAGNOSIS — G20 Parkinson's disease: Secondary | ICD-10-CM | POA: Diagnosis not present

## 2017-09-14 DIAGNOSIS — I2510 Atherosclerotic heart disease of native coronary artery without angina pectoris: Secondary | ICD-10-CM | POA: Diagnosis not present

## 2017-09-14 DIAGNOSIS — M419 Scoliosis, unspecified: Secondary | ICD-10-CM | POA: Diagnosis not present

## 2017-09-14 DIAGNOSIS — M4807 Spinal stenosis, lumbosacral region: Secondary | ICD-10-CM | POA: Diagnosis not present

## 2017-09-16 DIAGNOSIS — M419 Scoliosis, unspecified: Secondary | ICD-10-CM | POA: Diagnosis not present

## 2017-09-16 DIAGNOSIS — M4807 Spinal stenosis, lumbosacral region: Secondary | ICD-10-CM | POA: Diagnosis not present

## 2017-09-16 DIAGNOSIS — I2510 Atherosclerotic heart disease of native coronary artery without angina pectoris: Secondary | ICD-10-CM | POA: Diagnosis not present

## 2017-09-16 DIAGNOSIS — I110 Hypertensive heart disease with heart failure: Secondary | ICD-10-CM | POA: Diagnosis not present

## 2017-09-16 DIAGNOSIS — G20 Parkinson's disease: Secondary | ICD-10-CM | POA: Diagnosis not present

## 2017-09-16 DIAGNOSIS — I509 Heart failure, unspecified: Secondary | ICD-10-CM | POA: Diagnosis not present

## 2017-09-17 DIAGNOSIS — M419 Scoliosis, unspecified: Secondary | ICD-10-CM | POA: Diagnosis not present

## 2017-09-17 DIAGNOSIS — I110 Hypertensive heart disease with heart failure: Secondary | ICD-10-CM | POA: Diagnosis not present

## 2017-09-17 DIAGNOSIS — I509 Heart failure, unspecified: Secondary | ICD-10-CM | POA: Diagnosis not present

## 2017-09-17 DIAGNOSIS — I2510 Atherosclerotic heart disease of native coronary artery without angina pectoris: Secondary | ICD-10-CM | POA: Diagnosis not present

## 2017-09-17 DIAGNOSIS — G20 Parkinson's disease: Secondary | ICD-10-CM | POA: Diagnosis not present

## 2017-09-17 DIAGNOSIS — M4807 Spinal stenosis, lumbosacral region: Secondary | ICD-10-CM | POA: Diagnosis not present

## 2017-09-21 DIAGNOSIS — M4807 Spinal stenosis, lumbosacral region: Secondary | ICD-10-CM | POA: Diagnosis not present

## 2017-09-21 DIAGNOSIS — G20 Parkinson's disease: Secondary | ICD-10-CM | POA: Diagnosis not present

## 2017-09-21 DIAGNOSIS — M419 Scoliosis, unspecified: Secondary | ICD-10-CM | POA: Diagnosis not present

## 2017-09-21 DIAGNOSIS — I2510 Atherosclerotic heart disease of native coronary artery without angina pectoris: Secondary | ICD-10-CM | POA: Diagnosis not present

## 2017-09-21 DIAGNOSIS — I110 Hypertensive heart disease with heart failure: Secondary | ICD-10-CM | POA: Diagnosis not present

## 2017-09-21 DIAGNOSIS — I509 Heart failure, unspecified: Secondary | ICD-10-CM | POA: Diagnosis not present

## 2017-09-22 DIAGNOSIS — I509 Heart failure, unspecified: Secondary | ICD-10-CM | POA: Diagnosis not present

## 2017-09-22 DIAGNOSIS — G20 Parkinson's disease: Secondary | ICD-10-CM | POA: Diagnosis not present

## 2017-09-22 DIAGNOSIS — M419 Scoliosis, unspecified: Secondary | ICD-10-CM | POA: Diagnosis not present

## 2017-09-22 DIAGNOSIS — I110 Hypertensive heart disease with heart failure: Secondary | ICD-10-CM | POA: Diagnosis not present

## 2017-09-22 DIAGNOSIS — I2510 Atherosclerotic heart disease of native coronary artery without angina pectoris: Secondary | ICD-10-CM | POA: Diagnosis not present

## 2017-09-22 DIAGNOSIS — M4807 Spinal stenosis, lumbosacral region: Secondary | ICD-10-CM | POA: Diagnosis not present

## 2017-09-28 DIAGNOSIS — I110 Hypertensive heart disease with heart failure: Secondary | ICD-10-CM | POA: Diagnosis not present

## 2017-09-28 DIAGNOSIS — G20 Parkinson's disease: Secondary | ICD-10-CM | POA: Diagnosis not present

## 2017-09-28 DIAGNOSIS — M419 Scoliosis, unspecified: Secondary | ICD-10-CM | POA: Diagnosis not present

## 2017-09-28 DIAGNOSIS — I509 Heart failure, unspecified: Secondary | ICD-10-CM | POA: Diagnosis not present

## 2017-09-28 DIAGNOSIS — M4807 Spinal stenosis, lumbosacral region: Secondary | ICD-10-CM | POA: Diagnosis not present

## 2017-09-28 DIAGNOSIS — I2510 Atherosclerotic heart disease of native coronary artery without angina pectoris: Secondary | ICD-10-CM | POA: Diagnosis not present

## 2017-09-29 DIAGNOSIS — M419 Scoliosis, unspecified: Secondary | ICD-10-CM | POA: Diagnosis not present

## 2017-09-29 DIAGNOSIS — G20 Parkinson's disease: Secondary | ICD-10-CM | POA: Diagnosis not present

## 2017-09-29 DIAGNOSIS — M4807 Spinal stenosis, lumbosacral region: Secondary | ICD-10-CM | POA: Diagnosis not present

## 2017-09-29 DIAGNOSIS — I2510 Atherosclerotic heart disease of native coronary artery without angina pectoris: Secondary | ICD-10-CM | POA: Diagnosis not present

## 2017-09-29 DIAGNOSIS — I110 Hypertensive heart disease with heart failure: Secondary | ICD-10-CM | POA: Diagnosis not present

## 2017-09-29 DIAGNOSIS — I509 Heart failure, unspecified: Secondary | ICD-10-CM | POA: Diagnosis not present

## 2017-10-01 DIAGNOSIS — M4807 Spinal stenosis, lumbosacral region: Secondary | ICD-10-CM | POA: Diagnosis not present

## 2017-10-01 DIAGNOSIS — M419 Scoliosis, unspecified: Secondary | ICD-10-CM | POA: Diagnosis not present

## 2017-10-01 DIAGNOSIS — G20 Parkinson's disease: Secondary | ICD-10-CM | POA: Diagnosis not present

## 2017-10-01 DIAGNOSIS — I2510 Atherosclerotic heart disease of native coronary artery without angina pectoris: Secondary | ICD-10-CM | POA: Diagnosis not present

## 2017-10-01 DIAGNOSIS — I509 Heart failure, unspecified: Secondary | ICD-10-CM | POA: Diagnosis not present

## 2017-10-01 DIAGNOSIS — I110 Hypertensive heart disease with heart failure: Secondary | ICD-10-CM | POA: Diagnosis not present

## 2017-10-05 ENCOUNTER — Emergency Department (HOSPITAL_COMMUNITY): Payer: Medicare Other

## 2017-10-05 ENCOUNTER — Encounter (HOSPITAL_COMMUNITY): Payer: Self-pay

## 2017-10-05 ENCOUNTER — Emergency Department (HOSPITAL_COMMUNITY)
Admission: EM | Admit: 2017-10-05 | Discharge: 2017-10-05 | Disposition: A | Discharge status: Home / Self Care | Payer: Medicare Other | Attending: Emergency Medicine | Admitting: Emergency Medicine

## 2017-10-05 ENCOUNTER — Other Ambulatory Visit: Payer: Self-pay

## 2017-10-05 DIAGNOSIS — S0190XA Unspecified open wound of unspecified part of head, initial encounter: Secondary | ICD-10-CM | POA: Diagnosis not present

## 2017-10-05 DIAGNOSIS — Z87891 Personal history of nicotine dependence: Secondary | ICD-10-CM | POA: Diagnosis not present

## 2017-10-05 DIAGNOSIS — Y92002 Bathroom of unspecified non-institutional (private) residence single-family (private) house as the place of occurrence of the external cause: Secondary | ICD-10-CM | POA: Insufficient documentation

## 2017-10-05 DIAGNOSIS — Z23 Encounter for immunization: Secondary | ICD-10-CM | POA: Insufficient documentation

## 2017-10-05 DIAGNOSIS — Z79899 Other long term (current) drug therapy: Secondary | ICD-10-CM | POA: Diagnosis not present

## 2017-10-05 DIAGNOSIS — I4891 Unspecified atrial fibrillation: Secondary | ICD-10-CM | POA: Insufficient documentation

## 2017-10-05 DIAGNOSIS — G8911 Acute pain due to trauma: Secondary | ICD-10-CM | POA: Diagnosis not present

## 2017-10-05 DIAGNOSIS — Y998 Other external cause status: Secondary | ICD-10-CM | POA: Diagnosis not present

## 2017-10-05 DIAGNOSIS — I509 Heart failure, unspecified: Secondary | ICD-10-CM | POA: Insufficient documentation

## 2017-10-05 DIAGNOSIS — I2510 Atherosclerotic heart disease of native coronary artery without angina pectoris: Secondary | ICD-10-CM | POA: Diagnosis not present

## 2017-10-05 DIAGNOSIS — M4807 Spinal stenosis, lumbosacral region: Secondary | ICD-10-CM | POA: Diagnosis not present

## 2017-10-05 DIAGNOSIS — W010XXA Fall on same level from slipping, tripping and stumbling without subsequent striking against object, initial encounter: Secondary | ICD-10-CM | POA: Insufficient documentation

## 2017-10-05 DIAGNOSIS — Z950 Presence of cardiac pacemaker: Secondary | ICD-10-CM | POA: Diagnosis not present

## 2017-10-05 DIAGNOSIS — I10 Essential (primary) hypertension: Secondary | ICD-10-CM | POA: Diagnosis not present

## 2017-10-05 DIAGNOSIS — G20 Parkinson's disease: Secondary | ICD-10-CM | POA: Diagnosis not present

## 2017-10-05 DIAGNOSIS — S0101XA Laceration without foreign body of scalp, initial encounter: Secondary | ICD-10-CM | POA: Diagnosis not present

## 2017-10-05 DIAGNOSIS — M419 Scoliosis, unspecified: Secondary | ICD-10-CM | POA: Diagnosis not present

## 2017-10-05 DIAGNOSIS — W19XXXA Unspecified fall, initial encounter: Secondary | ICD-10-CM

## 2017-10-05 DIAGNOSIS — S0990XA Unspecified injury of head, initial encounter: Secondary | ICD-10-CM | POA: Diagnosis not present

## 2017-10-05 DIAGNOSIS — I110 Hypertensive heart disease with heart failure: Secondary | ICD-10-CM | POA: Insufficient documentation

## 2017-10-05 DIAGNOSIS — Y9389 Activity, other specified: Secondary | ICD-10-CM | POA: Diagnosis not present

## 2017-10-05 DIAGNOSIS — S299XXA Unspecified injury of thorax, initial encounter: Secondary | ICD-10-CM | POA: Diagnosis not present

## 2017-10-05 LAB — COMPREHENSIVE METABOLIC PANEL
ALT: 15 U/L — ABNORMAL LOW (ref 17–63)
AST: 24 U/L (ref 15–41)
Albumin: 3.1 g/dL — ABNORMAL LOW (ref 3.5–5.0)
Alkaline Phosphatase: 72 U/L (ref 38–126)
Anion gap: 10 (ref 5–15)
BUN: 31 mg/dL — ABNORMAL HIGH (ref 6–20)
CO2: 25 mmol/L (ref 22–32)
Calcium: 8.9 mg/dL (ref 8.9–10.3)
Chloride: 106 mmol/L (ref 101–111)
Creatinine, Ser: 1.35 mg/dL — ABNORMAL HIGH (ref 0.61–1.24)
GFR calc Af Amer: 52 mL/min — ABNORMAL LOW (ref >60)
GFR calc non Af Amer: 45 mL/min — ABNORMAL LOW (ref >60)
Glucose, Bld: 154 mg/dL — ABNORMAL HIGH (ref 65–99)
Potassium: 4.1 mmol/L (ref 3.5–5.1)
Sodium: 141 mmol/L (ref 135–145)
Total Bilirubin: 0.6 mg/dL (ref 0.3–1.2)
Total Protein: 6.5 g/dL (ref 6.5–8.1)

## 2017-10-05 LAB — CBC WITH DIFFERENTIAL/PLATELET
Basophils Absolute: 0 10*3/uL (ref 0.0–0.1)
Basophils Relative: 0 %
Eosinophils Absolute: 0.1 10*3/uL (ref 0.0–0.7)
Eosinophils Relative: 1 %
HCT: 32.2 % — ABNORMAL LOW (ref 39.0–52.0)
Hemoglobin: 10.3 g/dL — ABNORMAL LOW (ref 13.0–17.0)
Lymphocytes Relative: 19 %
Lymphs Abs: 0.9 10*3/uL (ref 0.7–4.0)
MCH: 31.1 pg (ref 26.0–34.0)
MCHC: 32 g/dL (ref 30.0–36.0)
MCV: 97.3 fL (ref 78.0–100.0)
Monocytes Absolute: 0.3 10*3/uL (ref 0.1–1.0)
Monocytes Relative: 7 %
Neutro Abs: 3.7 10*3/uL (ref 1.7–7.7)
Neutrophils Relative %: 73 %
Platelets: 179 10*3/uL (ref 150–400)
RBC: 3.31 MIL/uL — ABNORMAL LOW (ref 4.22–5.81)
RDW: 14.9 % (ref 11.5–15.5)
WBC: 5 10*3/uL (ref 4.0–10.5)

## 2017-10-05 LAB — BRAIN NATRIURETIC PEPTIDE: B Natriuretic Peptide: 718.1 pg/mL — ABNORMAL HIGH (ref 0.0–100.0)

## 2017-10-05 LAB — I-STAT TROPONIN, ED: Troponin i, poc: 0.07 ng/mL (ref 0.00–0.08)

## 2017-10-05 MED ORDER — TETANUS-DIPHTH-ACELL PERTUSSIS 5-2.5-18.5 LF-MCG/0.5 IM SUSP
0.5000 mL | Freq: Once | INTRAMUSCULAR | Status: AC
  Administered 2017-10-05: 0.5 mL via INTRAMUSCULAR
  Filled 2017-10-05: qty 0.5

## 2017-10-05 NOTE — ED Triage Notes (Signed)
Pt from [facility] (assisted living). Pt was zipping up pants and lost his balance and fell and hit the back of his head. Pt has a 1in lac to back of head with no other complaints

## 2017-10-05 NOTE — ED Notes (Signed)
Patient verbalizes understanding of discharge instructions. Opportunity for questioning and answers were provided. Armband removed by staff, pt discharged from ED.  

## 2017-10-05 NOTE — ED Notes (Signed)
Pt able to walk with walker. No falling or weakness noted.

## 2017-10-05 NOTE — ED Notes (Signed)
Pt left via [facility] to home

## 2017-10-05 NOTE — ED Notes (Signed)
Contacted [facility] for tx back to [place]

## 2017-10-05 NOTE — ED Provider Notes (Addendum)
[facility] EMERGENCY DEPARTMENT Provider Note   CSN: [id number] Arrival date & time: 10/05/17  1724     History   Chief Complaint Chief Complaint  Patient presents with  . Fall    HPI [name] is a 84 y.o. male.  Patient is an 84 year old male with a history of CHF, hypertension, syncope, atrial fibrillation on Xarelto, Parkinson's disease coming in today after a fall.  Patient states that he was going to the bathroom and lost his balance falling backwards and hitting his head.  He did not lose consciousness and states he did not feel dizzy or lightheaded prior to the fall.  He states he will often lose his balance and has been falling more frequently in the last year.  Patient does admit to feeling more generally weak progressively over the last 1 month.  He is having some exertional dyspnea but denies any leg swelling, chest pain, palpitations, abdominal pain, nausea or vomiting.  Patient is eating.  He is taking his medications as prescribed.  He denies any fever, cough or urinary symptoms.  He is concerned that his congestive heart failure may be getting worse.   The history is provided by the patient.    Past Medical History:  Diagnosis Date  . Arthritis    incl spinal stenosis  . Atrial fibrillation ([place])   . CHF (congestive heart failure) ([place])   . CHF (congestive heart failure) ([place])   . HTN (hypertension)   . Parkinson disease ([place])   . Renal insufficiency    question degree  . Wears glasses    reading  . Wears partial dentures    upper partial    Patient Active Problem List   Diagnosis Date Noted  . CHF (congestive heart failure) ([place])   . Hypertension 09/17/2012  . Pneumonia 12/03/2011  . Syncope 10/13/2011  . ATRIAL FIBRILLATION 02/19/2010  . RENAL INSUFFICIENCY 02/19/2010  . PACEMAKER, single [employer] 02/19/2010    Past Surgical History:  Procedure Laterality Date  . bilateral shoulder surgery    . EYE  SURGERY     both catararcts  . I&D EXTREMITY  12/07/2011   Procedure: IRRIGATION AND DEBRIDEMENT EXTREMITY;  Surgeon: Tennis Must, MD;  Location: [facility];  Service: Orthopedics;  Laterality: Left;  . PACEMAKER GENERATOR CHANGE N/A 11/28/2013   Procedure: PACEMAKER GENERATOR CHANGE;  Surgeon: Deboraha Sprang, MD;  Location: [facility] CATH LAB;  Service: Cardiovascular;  Laterality: N/A;  . PACEMAKER INSERTION     BSX pacemaker generator change 11-28-2013 by Dr Caryl Comes  . prosthetic hip - bilaterally    . R knee quadricep tendon repair    . TONSILLECTOMY    . TRIGGER FINGER RELEASE Right 05/26/2013   Procedure: RIGHT SMALL TRIGGER RELEASE ;  Surgeon: Tennis Must, MD;  Location: [facility];  Service: Orthopedics;  Laterality: Right;       Home Medications    Prior to Admission medications   Medication Sig Start Date End Date Taking? Authorizing Provider  acetaminophen (TYLENOL) 500 MG tablet Take 1,000 mg by mouth 2 (two) times daily. scheduled    [provider]  amoxicillin (AMOXIL) 500 MG capsule Take 500 mg by mouth See admin instructions. Take 4 capsules (2000 mg) by mouth prior to dental appointment    [provider]  Ascorbic Acid (VITAMIN C) 1000 MG tablet Take 1,000 mg by mouth daily.    [provider]  b complex vitamins tablet Take 1  tablet by mouth daily.    [provider]  beta carotene w/minerals (OCUVITE) tablet Take 1 tablet by mouth 2 (two) times daily.    [provider]  bisacodyl (DULCOLAX) 5 MG EC tablet Take 10 mg by mouth daily as needed for moderate constipation.    [provider]  CALCIUM CITRATE PO Take 1 tablet by mouth daily.     [provider]  carbidopa-levodopa (SINEMET IR) 25-100 MG tablet Take 0.5 tablets by mouth 3 (three) times daily. 12/09/16   Ward Givens, NP  carvedilol (COREG) 6.25 MG tablet Take 1 tablet (6.25 mg total) by mouth 2 (two) times daily with a meal. 07/01/17    Seiler, Amber K, NP  [place] 450 MG CAPS Take 450 mg by mouth daily as needed (constipation).    [provider]  Cholecalciferol (VITAMIN D-3) 5000 UNITS TABS Take 5,000 Units by mouth daily.    [provider]  cyclobenzaprine (FLEXERIL) 10 MG tablet Take 1 tablet (10 mg total) by mouth 2 (two) times daily as needed for muscle spasms. 06/18/14   Szekalski, Kaitlyn, [name]  docusate sodium (COLACE) 100 MG capsule Take 100 mg by mouth daily. 07/22/16   [provider]  finasteride (PROSCAR) 5 MG tablet Take 5 mg by mouth daily.     [provider]  fluticasone (VERAMYST) 27.5 MCG/SPRAY nasal spray Place 2 sprays into the nose daily.    [provider]  furosemide (LASIX) 20 MG tablet Take 20 mg by mouth daily.    [provider]  HYDROcodone-acetaminophen (NORCO/VICODIN) 5-325 MG per tablet Take 1-2 tablets by mouth every 4 (four) hours as needed for moderate pain or severe pain. 06/18/14   [name], [name]  LISINOPRIL PO Take 1 tablet by mouth daily. PT UNSURE OF DOSE    [provider]  Mouthwashes (BIOTENE DRY MOUTH MT) Use as directed 10 mLs in the mouth or throat daily as needed (dry mouth).    [provider]  Multiple Vitamin (MULITIVITAMIN WITH MINERALS) TABS Take 1 tablet by mouth daily.     [provider]  Omega-3 Fatty Acids (FISH OIL) 1200 MG CAPS Take 2,400 mg by mouth 2 (two) times daily.    [provider]  oxybutynin (DITROPAN-XL) 5 MG 24 hr tablet Take 5 mg by mouth at bedtime.    [provider]  polyvinyl alcohol (LIQUIFILM TEARS) 1.4 % ophthalmic solution Place 1 drop into both eyes 5 (five) times daily as needed for dry eyes.     [provider]  pravastatin (PRAVACHOL) 40 MG tablet Take 40 mg by mouth at bedtime.    [provider]  Rivaroxaban 15 & 20 MG TBPK Take 15-20 mg by mouth daily. 01/27/17   [provider]  sacubitril-valsartan  (ENTRESTO) 24-26 MG Take 1 tablet by mouth 2 (two) times daily.    [provider]  Sodium Fluoride (FLUORIDEX DAILY DEFENSE DT) Place 1 application onto teeth daily.    [provider]  Tamsulosin HCl (FLOMAX) 0.4 MG CAPS Take 0.4 mg by mouth 2 (two) times daily.     [provider]  traMADol (ULTRAM) 50 MG tablet Take 100 mg by mouth 2 (two) times daily. scheduled    [provider]  zolpidem (AMBIEN) 10 MG tablet Take 10 mg by mouth at bedtime as needed for sleep.     [provider]  buPROPion (WELLBUTRIN) 100 MG tablet Take 200 mg by mouth daily.  10/13/11  [provider]    Family History History reviewed. No pertinent family history.  Social History Social History   Tobacco Use  . Smoking status: Former Smoker    Types: Cigarettes    Last attempt to quit: 05/13/1971    Years since quitting: 46.4  . Smokeless tobacco: Never Used  Substance Use Topics  . Alcohol use: No    Frequency: Never    Comment: occasional  . Drug use: No     Allergies   Amiodarone   Review of Systems Review of Systems  All other systems reviewed and are negative.    Physical Exam Updated Vital Signs BP 125/82 (BP Location: Right Arm)   Pulse (!) 132   Temp 98.1 F (36.7 C) (Oral)   Resp 18   Ht 5\' 8"  (1.727 m)   Wt 68 kg (150 lb)   SpO2 95%   BMI 22.81 kg/m   Physical Exam  Constitutional: He is oriented to person, place, and time. He appears well-developed and well-nourished. No distress.  HENT:  Head: Normocephalic. Head is with laceration.    Mouth/Throat: Oropharynx is clear and moist.  Eyes: Conjunctivae and EOM are normal. Pupils are equal, round, and reactive to light.  Neck: Normal range of motion. Neck supple.  Cardiovascular: Intact distal pulses. An irregularly irregular rhythm present. Tachycardia present.  No murmur heard. Pulmonary/Chest: Effort normal and breath sounds normal. No respiratory distress. He  has no wheezes. He has no rales.  Abdominal: Soft. He exhibits no distension. There is no tenderness. There is no rebound and no guarding.  Musculoskeletal: Normal range of motion. He exhibits no edema or tenderness.  Able to range actively bilateral hips and shoulders.  No apparent signs of injury  Neurological: He is alert and oriented to person, place, and time.  5 out of 5 strength in bilateral upper and lower extremities.  Skin: Skin is warm and dry. No rash noted. No erythema.  Psychiatric: He has a normal mood and affect. His behavior is normal.  Nursing note and vitals reviewed.    ED Treatments / Results  Labs (all labs ordered are listed, but only abnormal results are displayed) Labs Reviewed  CBC WITH DIFFERENTIAL/PLATELET - Abnormal; Notable for the following components:      Result Value   RBC 3.31 (*)    Hemoglobin 10.3 (*)    HCT 32.2 (*)    All other components within normal limits  COMPREHENSIVE METABOLIC PANEL - Abnormal; Notable for the following components:   Glucose, Bld 154 (*)    BUN 31 (*)    Creatinine, Ser 1.35 (*)    Albumin 3.1 (*)    ALT 15 (*)    GFR calc non Af Amer 45 (*)    GFR calc Af Amer 52 (*)    All other components within normal limits  BRAIN NATRIURETIC PEPTIDE - Abnormal; Notable for the following components:   B Natriuretic Peptide 718.1 (*)    All other components within normal limits  I-STAT TROPONIN, ED    EKG  EKG Interpretation  Date/Time:  Monday October 05 2017 19:11:57 EDT Ventricular Rate:  60 PR Interval:    QRS Duration: 144 QT Interval:  474 QTC Calculation: 474 R Axis:   97 Text Interpretation:  Wide QRS rhythm Rightward axis Non-specific intra-ventricular conduction block Cannot rule out Septal infarct , age undetermined Marked T-wave abnormality, consider inferolateral ischemia No significant change since last tracing Confirmed by Blanchie Dessert [phone])  on 10/05/2017 7:33:29 PM       Radiology Dg Chest 2  View  Result Date: 10/05/2017 CLINICAL DATA:  Fall.  Weakness.  CHF. EXAM: CHEST - 2 VIEW COMPARISON:  03/16/2016 chest radiograph. FINDINGS: Two lead left subclavian pacemaker is stable in configuration. Stable cardiomediastinal silhouette with mild cardiomegaly. No pneumothorax. No pleural effusion. No overt pulmonary edema. Reticular opacities at the lung bases are not appreciably changed. No acute consolidative airspace disease. IMPRESSION: 1. Stable mild cardiomegaly without overt pulmonary edema. 2. Stable bibasilar reticular opacities, compatible with nonspecific fibrosis. No acute pulmonary disease. Electronically Signed   By: Ilona Sorrel M.D.   On: 10/05/2017 20:16   Ct Head Wo Contrast  Result Date: 10/05/2017 CLINICAL DATA:  84 year old male with head trauma. EXAM: CT HEAD WITHOUT CONTRAST TECHNIQUE: Contiguous axial images were obtained from the base of the skull through the vertex without intravenous contrast. COMPARISON:  Head CT dated 08/27/2017 FINDINGS: Brain: There is mild age-related atrophy and chronic microvascular ischemic changes. Bilateral basal ganglia hypodense areas most consistent with chronic ischemic changes. There is no acute intracranial hemorrhage. No mass effect or midline shift. No extra-axial fluid collection. Vascular: No hyperdense vessel or unexpected calcification. Skull: Normal. Negative for fracture or focal lesion. Sinuses/Orbits: No acute finding. Other: None IMPRESSION: 1. No acute intracranial hemorrhage. 2. Mild age-related atrophy and chronic microvascular ischemic changes. Electronically Signed   By: Anner Crete M.D.   On: 10/05/2017 18:34    Procedures Procedures (including critical care time) LACERATION REPAIR Performed by: [employer] Authorized by: Blanchie Dessert Consent: Verbal consent obtained. Risks and benefits: risks, benefits and alternatives were discussed Consent given by: patient Patient identity confirmed: provided  demographic data Prepped and Draped in normal sterile fashion Wound explored  Laceration Location: right parietal scalp  Laceration Length: 3cm  No Foreign Bodies seen or palpated  Anesthesia: none Irrigation method: syringe Amount of cleaning: standard  Skin closure: staples  Number of sutures: 3   Patient tolerance: Patient tolerated the procedure well with no immediate complications.   Medications Ordered in ED Medications  Tdap (BOOSTRIX) injection 0.5 mL (not administered)     Initial Impression / Assessment and Plan / ED Course  I have reviewed the triage vital signs and the nursing notes.  Pertinent labs & imaging results that were available during my care of the patient were reviewed by me and considered in my medical decision making (see chart for details).     Pleasant elderly gentleman with multiple medical problems who lives at home alone and had a fall today where he described losing his balance while going to the bathroom and falling backwards hitting his head on the corner of a door.  Patient denies loss of consciousness or dizziness/palpitations/chest pain prior to the event.  However patient has stated that he is felt generally weak more so over the last 1 month which seems to be worsening.  Patient does have atrial fibrillation today and initially is tachycardic.  Will get an EKG.  Also will ensure no evidence of worsening CHF.  Patient does not have excessive fluid overload at this time based on exam.  He denies any infectious symptoms but will also check electrolytes and hemoglobin to ensure that that is not causing him to feel weak.  Last month he was changed from Eliquis to [place] because he developed a DVT but he does not think he has had any other medication changes.  CBC, CMP, BNP, troponin, EKG, chest x-ray, head  CT pending  8:36 PM Head CT is negative, CBC without acute findings, CMP is at baseline, troponin within normal limits, BNP is 700 without  an old to compare.  However chest x-ray without evidence of fluid overload at this time.  Patient's oxygen saturation is reassuring and again has no physical exam findings for fluid overload.  Patient's head was repaired as above.  Will ensure that patient can ambulate safely prior to discharge and recommended him following up with his doctor about his weakness as it may be related to medications.  9:20 PM Pt was able to ambulate in the department with a walker without difficulty and was d/ced home. Final Clinical Impressions(s) / ED Diagnoses   Final diagnoses:  Fall, initial encounter  Scalp laceration, initial encounter    ED Discharge Orders    None       Blanchie Dessert, MD 10/05/17 2038    Blanchie Dessert, MD 10/05/17 2120

## 2017-10-12 DIAGNOSIS — I110 Hypertensive heart disease with heart failure: Secondary | ICD-10-CM | POA: Diagnosis not present

## 2017-10-12 DIAGNOSIS — I509 Heart failure, unspecified: Secondary | ICD-10-CM | POA: Diagnosis not present

## 2017-10-12 DIAGNOSIS — I2510 Atherosclerotic heart disease of native coronary artery without angina pectoris: Secondary | ICD-10-CM | POA: Diagnosis not present

## 2017-10-12 DIAGNOSIS — M4807 Spinal stenosis, lumbosacral region: Secondary | ICD-10-CM | POA: Diagnosis not present

## 2017-10-12 DIAGNOSIS — G20 Parkinson's disease: Secondary | ICD-10-CM | POA: Diagnosis not present

## 2017-10-12 DIAGNOSIS — M419 Scoliosis, unspecified: Secondary | ICD-10-CM | POA: Diagnosis not present

## 2017-10-14 DIAGNOSIS — I482 Chronic atrial fibrillation: Secondary | ICD-10-CM | POA: Diagnosis not present

## 2017-10-14 DIAGNOSIS — G20 Parkinson's disease: Secondary | ICD-10-CM | POA: Diagnosis not present

## 2017-10-14 DIAGNOSIS — Z950 Presence of cardiac pacemaker: Secondary | ICD-10-CM | POA: Diagnosis not present

## 2017-10-14 DIAGNOSIS — I5020 Unspecified systolic (congestive) heart failure: Secondary | ICD-10-CM | POA: Diagnosis not present

## 2017-10-14 DIAGNOSIS — E785 Hyperlipidemia, unspecified: Secondary | ICD-10-CM | POA: Diagnosis not present

## 2017-10-14 DIAGNOSIS — R2681 Unsteadiness on feet: Secondary | ICD-10-CM | POA: Diagnosis not present

## 2017-10-14 DIAGNOSIS — I119 Hypertensive heart disease without heart failure: Secondary | ICD-10-CM | POA: Diagnosis not present

## 2017-10-14 DIAGNOSIS — I639 Cerebral infarction, unspecified: Secondary | ICD-10-CM | POA: Diagnosis not present

## 2017-10-14 DIAGNOSIS — R7309 Other abnormal glucose: Secondary | ICD-10-CM | POA: Diagnosis not present

## 2017-10-14 DIAGNOSIS — I38 Endocarditis, valve unspecified: Secondary | ICD-10-CM | POA: Diagnosis not present

## 2017-10-15 ENCOUNTER — Ambulatory Visit (INDEPENDENT_AMBULATORY_CARE_PROVIDER_SITE_OTHER): Payer: Medicare Other | Admitting: *Deleted

## 2017-10-15 DIAGNOSIS — I495 Sick sinus syndrome: Secondary | ICD-10-CM | POA: Diagnosis not present

## 2017-10-15 NOTE — Progress Notes (Signed)
Remote pacemaker transmission.   

## 2017-10-16 ENCOUNTER — Encounter: Payer: Self-pay | Admitting: Cardiology

## 2017-10-19 DIAGNOSIS — S0191XA Laceration without foreign body of unspecified part of head, initial encounter: Secondary | ICD-10-CM | POA: Diagnosis not present

## 2017-10-19 DIAGNOSIS — K5641 Fecal impaction: Secondary | ICD-10-CM | POA: Diagnosis not present

## 2017-10-19 LAB — CUP PACEART REMOTE DEVICE CHECK
Battery Remaining Longevity: 84 mo
Battery Remaining Percentage: 100 %
Brady Statistic RV Percent Paced: 70 %
Date Time Interrogation Session: 20190321044100
Implantable Lead Implant Date: 20000401
Implantable Lead Location: 753860
Implantable Lead Model: 4245
Implantable Lead Serial Number: 402472
Implantable Pulse Generator Implant Date: 20150505
Lead Channel Impedance Value: 439 Ohm
Lead Channel Pacing Threshold Amplitude: 0.8 V
Lead Channel Pacing Threshold Pulse Width: 0.4 ms
Lead Channel Setting Pacing Amplitude: 1.3 V
Lead Channel Setting Pacing Pulse Width: 0.4 ms
Lead Channel Setting Sensing Sensitivity: 2.5 mV
Pulse Gen Serial Number: 390662

## 2017-11-04 ENCOUNTER — Other Ambulatory Visit: Payer: Self-pay

## 2017-11-04 ENCOUNTER — Emergency Department (HOSPITAL_COMMUNITY): Payer: Medicare Other

## 2017-11-04 ENCOUNTER — Emergency Department (HOSPITAL_COMMUNITY)
Admission: EM | Admit: 2017-11-04 | Discharge: 2017-11-04 | Disposition: A | Discharge status: Home / Self Care | Payer: Medicare Other | Attending: Emergency Medicine | Admitting: Emergency Medicine

## 2017-11-04 ENCOUNTER — Encounter (HOSPITAL_COMMUNITY): Payer: Self-pay | Admitting: Emergency Medicine

## 2017-11-04 DIAGNOSIS — R51 Headache: Secondary | ICD-10-CM | POA: Diagnosis not present

## 2017-11-04 DIAGNOSIS — I509 Heart failure, unspecified: Secondary | ICD-10-CM | POA: Diagnosis not present

## 2017-11-04 DIAGNOSIS — G20 Parkinson's disease: Secondary | ICD-10-CM | POA: Insufficient documentation

## 2017-11-04 DIAGNOSIS — S0990XA Unspecified injury of head, initial encounter: Secondary | ICD-10-CM | POA: Diagnosis not present

## 2017-11-04 DIAGNOSIS — G8911 Acute pain due to trauma: Secondary | ICD-10-CM | POA: Diagnosis not present

## 2017-11-04 DIAGNOSIS — Y999 Unspecified external cause status: Secondary | ICD-10-CM | POA: Diagnosis not present

## 2017-11-04 DIAGNOSIS — W010XXA Fall on same level from slipping, tripping and stumbling without subsequent striking against object, initial encounter: Secondary | ICD-10-CM | POA: Insufficient documentation

## 2017-11-04 DIAGNOSIS — I110 Hypertensive heart disease with heart failure: Secondary | ICD-10-CM | POA: Diagnosis not present

## 2017-11-04 DIAGNOSIS — Z87891 Personal history of nicotine dependence: Secondary | ICD-10-CM | POA: Insufficient documentation

## 2017-11-04 DIAGNOSIS — S0003XA Contusion of scalp, initial encounter: Secondary | ICD-10-CM | POA: Insufficient documentation

## 2017-11-04 DIAGNOSIS — Y9389 Activity, other specified: Secondary | ICD-10-CM | POA: Diagnosis not present

## 2017-11-04 DIAGNOSIS — Z79899 Other long term (current) drug therapy: Secondary | ICD-10-CM | POA: Insufficient documentation

## 2017-11-04 DIAGNOSIS — Y92121 Bathroom in nursing home as the place of occurrence of the external cause: Secondary | ICD-10-CM | POA: Insufficient documentation

## 2017-11-04 DIAGNOSIS — Z950 Presence of cardiac pacemaker: Secondary | ICD-10-CM | POA: Insufficient documentation

## 2017-11-04 DIAGNOSIS — S0181XA Laceration without foreign body of other part of head, initial encounter: Secondary | ICD-10-CM | POA: Diagnosis not present

## 2017-11-04 DIAGNOSIS — Z7902 Long term (current) use of antithrombotics/antiplatelets: Secondary | ICD-10-CM | POA: Insufficient documentation

## 2017-11-04 DIAGNOSIS — S199XXA Unspecified injury of neck, initial encounter: Secondary | ICD-10-CM | POA: Diagnosis not present

## 2017-11-04 NOTE — ED Provider Notes (Signed)
[facility] EMERGENCY DEPARTMENT Provider Note   CSN: [id number] Arrival date & time: 11/04/17  0059     History   Chief Complaint Chief Complaint  Patient presents with  . Fall    HPI [name] is a 84 y.o. male.  Patient presents to the emergency department for evaluation after a fall.  Patient reports that he fell while going to the bathroom.  He was walking and lost his balance, fell backwards.  He reports hitting his head on a door.  He has a headache.  Patient is on blood thinner because of a history of atrial fibrillation.  He did not lose consciousness.  He denies extremity injury, back injury.     Past Medical History:  Diagnosis Date  . Arthritis    incl spinal stenosis  . Atrial fibrillation ([place])   . CHF (congestive heart failure) ([place])   . CHF (congestive heart failure) ([place])   . HTN (hypertension)   . Parkinson disease ([place])   . Renal insufficiency    question degree  . Wears glasses    reading  . Wears partial dentures    upper partial    Patient Active Problem List   Diagnosis Date Noted  . CHF (congestive heart failure) ([place])   . Hypertension 09/17/2012  . Pneumonia 12/03/2011  . Syncope 10/13/2011  . ATRIAL FIBRILLATION 02/19/2010  . RENAL INSUFFICIENCY 02/19/2010  . PACEMAKER, single [employer] 02/19/2010    Past Surgical History:  Procedure Laterality Date  . bilateral shoulder surgery    . EYE SURGERY     both catararcts  . I&D EXTREMITY  12/07/2011   Procedure: IRRIGATION AND DEBRIDEMENT EXTREMITY;  Surgeon: Tennis Must, MD;  Location: [facility];  Service: Orthopedics;  Laterality: Left;  . PACEMAKER GENERATOR CHANGE N/A 11/28/2013   Procedure: PACEMAKER GENERATOR CHANGE;  Surgeon: Deboraha Sprang, MD;  Location: [facility] CATH LAB;  Service: Cardiovascular;  Laterality: N/A;  . PACEMAKER INSERTION     BSX pacemaker generator change 11-28-2013 by Dr Caryl Comes  . prosthetic hip - bilaterally    . R knee  quadricep tendon repair    . TONSILLECTOMY    . TRIGGER FINGER RELEASE Right 05/26/2013   Procedure: RIGHT SMALL TRIGGER RELEASE ;  Surgeon: Tennis Must, MD;  Location: [facility];  Service: Orthopedics;  Laterality: Right;        Home Medications    Prior to Admission medications   Medication Sig Start Date End Date Taking? Authorizing Provider  acetaminophen (TYLENOL) 500 MG tablet Take 1,000 mg by mouth 2 (two) times daily. scheduled    [provider]  amoxicillin (AMOXIL) 500 MG capsule Take 500 mg by mouth See admin instructions. Take 4 capsules (2000 mg) by mouth prior to dental appointment    [provider]  Ascorbic Acid (VITAMIN C) 1000 MG tablet Take 1,000 mg by mouth daily.    [provider]  b complex vitamins tablet Take 1 tablet by mouth daily.    [provider]  beta carotene w/minerals (OCUVITE) tablet Take 1 tablet by mouth 2 (two) times daily.    [provider]  bisacodyl (DULCOLAX) 5 MG EC tablet Take 10 mg by mouth daily as needed for moderate constipation.    [provider]  CALCIUM CITRATE PO Take 1 tablet by mouth daily.     [provider]  carbidopa-levodopa (SINEMET IR) 25-100 MG tablet Take 0.5 tablets by mouth 3 (  three) times daily. 12/09/16   Ward Givens, NP  carvedilol (COREG) 6.25 MG tablet Take 1 tablet (6.25 mg total) by mouth 2 (two) times daily with a meal. 07/01/17   Seiler, Amber K, NP  [place] 450 MG CAPS Take 450 mg by mouth daily as needed (constipation).    [provider]  Cholecalciferol (VITAMIN D-3) 5000 UNITS TABS Take 5,000 Units by mouth daily.    [provider]  cyclobenzaprine (FLEXERIL) 10 MG tablet Take 1 tablet (10 mg total) by mouth 2 (two) times daily as needed for muscle spasms. 06/18/14   Szekalski, Kaitlyn, [name]  docusate sodium (COLACE) 100 MG capsule Take 100 mg by mouth daily. 07/22/16   [provider]    finasteride (PROSCAR) 5 MG tablet Take 5 mg by mouth daily.     [provider]  fluticasone (VERAMYST) 27.5 MCG/SPRAY nasal spray Place 2 sprays into the nose daily.    [provider]  furosemide (LASIX) 20 MG tablet Take 20 mg by mouth daily.    [provider]  HYDROcodone-acetaminophen (NORCO/VICODIN) 5-325 MG per tablet Take 1-2 tablets by mouth every 4 (four) hours as needed for moderate pain or severe pain. 06/18/14   [name], [name]  LISINOPRIL PO Take 1 tablet by mouth daily.     [provider]  Mouthwashes (BIOTENE DRY MOUTH MT) Use as directed 10 mLs in the mouth or throat daily as needed (dry mouth).    [provider]  Multiple Vitamin (MULITIVITAMIN WITH MINERALS) TABS Take 1 tablet by mouth daily.     [provider]  Omega-3 Fatty Acids (FISH OIL) 1200 MG CAPS Take 2,400 mg by mouth 2 (two) times daily.    [provider]  oxybutynin (DITROPAN-XL) 5 MG 24 hr tablet Take 5 mg by mouth at bedtime.    [provider]  polyvinyl alcohol (LIQUIFILM TEARS) 1.4 % ophthalmic solution Place 1 drop into both eyes 5 (five) times daily as needed for dry eyes.     [provider]  pravastatin (PRAVACHOL) 40 MG tablet Take 40 mg by mouth at bedtime.    [provider]  Rivaroxaban 15 & 20 MG TBPK Take 15-20 mg by mouth daily. 01/27/17   [provider]  sacubitril-valsartan (ENTRESTO) 24-26 MG Take 1 tablet by mouth 2 (two) times daily.    [provider]  Sodium Fluoride (FLUORIDEX DAILY DEFENSE DT) Place 1 application onto teeth daily.    [provider]  Tamsulosin HCl (FLOMAX) 0.4 MG CAPS Take 0.4 mg by mouth 2 (two) times daily.     [provider]  traMADol (ULTRAM) 50 MG tablet Take 100 mg by mouth 2 (two) times daily. scheduled    [provider]  zolpidem (AMBIEN) 10 MG tablet Take 10 mg by mouth at bedtime as needed for sleep.     [provider]  buPROPion (WELLBUTRIN) 100 MG tablet Take 200 mg by mouth daily.    10/13/11  [provider]    Family History No family history on file.  Social History Social History   Tobacco Use  . Smoking status: Former Smoker    Types: Cigarettes    Last attempt to quit: 05/13/1971    Years since quitting: 46.5  . Smokeless tobacco: Never Used  Substance Use Topics  . Alcohol use: No    Frequency: Never    Comment: occasional  . Drug use: No     Allergies  Amiodarone   Review of Systems Review of Systems  Neurological: Positive for headaches.  All other systems reviewed and are negative.    Physical Exam Updated Vital Signs BP (!) 143/74 (BP Location: Left Arm)   Pulse 62   Temp 97.8 F (36.6 C) (Oral)   Resp 16   Ht 5\' 8"  (1.727 m)   Wt 68 kg (150 lb)   SpO2 98%   BMI 22.81 kg/m   Physical Exam  Constitutional: He is oriented to person, place, and time. He appears well-developed and well-nourished. No distress.  HENT:  Head: Normocephalic. Head is with abrasion (Posterior scalp) and with contusion (Posterior scalp).  Right Ear: Hearing normal.  Left Ear: Hearing normal.  Nose: Nose normal.  Mouth/Throat: Oropharynx is clear and moist and mucous membranes are normal.  Eyes: Pupils are equal, round, and reactive to light. Conjunctivae and EOM are normal.  Neck: Normal range of motion. Neck supple.  Cardiovascular: Regular rhythm, S1 normal and S2 normal. Exam reveals no gallop and no friction rub.  No murmur heard. Pulmonary/Chest: Effort normal and breath sounds normal. No respiratory distress. He exhibits no tenderness.  Abdominal: Soft. Normal appearance and bowel sounds are normal. There is no hepatosplenomegaly. There is no tenderness. There is no rebound, no guarding, no tenderness at McBurney's point and negative Murphy's sign. No hernia.  Musculoskeletal: Normal range of motion.  Neurological: He is alert and oriented to person,  place, and time. He has normal strength. No cranial nerve deficit or sensory deficit. Coordination normal. GCS eye subscore is 4. GCS verbal subscore is 5. GCS motor subscore is 6.  Skin: Skin is warm, dry and intact. No rash noted. No cyanosis.  Psychiatric: He has a normal mood and affect. His speech is normal and behavior is normal. Thought content normal.  Nursing note and vitals reviewed.    ED Treatments / Results  Labs (all labs ordered are listed, but only abnormal results are displayed) Labs Reviewed - No data to display  EKG None  Radiology Ct Head Wo Contrast  Result Date: 11/04/2017 CLINICAL DATA:  Initial evaluation for acute trauma, fall. EXAM: CT HEAD WITHOUT CONTRAST CT CERVICAL SPINE WITHOUT CONTRAST TECHNIQUE: Multidetector CT imaging of the head and cervical spine was performed following the standard protocol without intravenous contrast. Multiplanar CT image reconstructions of the cervical spine were also generated. COMPARISON:  Prior CT from 10/05/2017. FINDINGS: CT HEAD FINDINGS Brain: In moderate cerebral atrophy with chronic small vessel ischemic disease. Scatter remote lacunar infarcts present within the bilateral thalami. Few small remote left cerebellar infarct. No acute intracranial hemorrhage. No acute large vessel territory infarct. No mass lesion, midline shift or mass effect. Ventricular prominence related to global parenchymal volume loss of hydrocephalus. No extra-axial fluid collection. Vascular: No hyperdense vessel. Calcified atherosclerosis present at the skull base. Skull: No discernible scalp soft tissue injury.  Calvarium intact. Sinuses/Orbits: Globes orbital soft tissues within normal limits. Paranasal sinuses and mastoid air cells are clear. Other: None. CT CERVICAL SPINE FINDINGS Alignment: Levoscoliosis with straightening of the normal cervical lordosis. Trace anterolisthesis of C4 on C5, C5 on C6, and C7 on T1. Skull base and vertebrae: Skull base  intact. Mild rotation of C1 on C2 likely positional. Normal C1-2 articulations otherwise preserved. Dens intact. Vertebral body heights maintained. No acute fracture. Soft tissues and spinal canal: Paraspinous soft tissues demonstrate no acute abnormality. No abnormal prevertebral edema. Spinal canal within normal limits. Advanced atherosclerotic plaque about the carotid bifurcations. Disc levels:  Advanced multilevel facet arthropathy seen throughout the cervical spine. Moderate multilevel degenerative spondylolysis, most notable at C6-7. Upper chest: Partially visualized upper chest demonstrates no acute abnormality. Partially visualized lung apices are grossly clear. Paraseptal emphysematous changes noted. Other: None. IMPRESSION: CT BRAIN: 1. No acute intracranial abnormality. 2. Moderate age-related cerebral atrophy with chronic small vessel ischemic disease. CT CERVICAL SPINE: 1. No acute traumatic injury within the cervical spine. 2. Levoscoliosis with moderate multilevel degenerative spondylolysis and facet arthrosis. Electronically Signed   By: Jeannine Boga M.D.   On: 11/04/2017 03:21   Ct Cervical Spine Wo Contrast  Result Date: 11/04/2017 CLINICAL DATA:  Initial evaluation for acute trauma, fall. EXAM: CT HEAD WITHOUT CONTRAST CT CERVICAL SPINE WITHOUT CONTRAST TECHNIQUE: Multidetector CT imaging of the head and cervical spine was performed following the standard protocol without intravenous contrast. Multiplanar CT image reconstructions of the cervical spine were also generated. COMPARISON:  Prior CT from 10/05/2017. FINDINGS: CT HEAD FINDINGS Brain: In moderate cerebral atrophy with chronic small vessel ischemic disease. Scatter remote lacunar infarcts present within the bilateral thalami. Few small remote left cerebellar infarct. No acute intracranial hemorrhage. No acute large vessel territory infarct. No mass lesion, midline shift or mass effect. Ventricular prominence related to global  parenchymal volume loss of hydrocephalus. No extra-axial fluid collection. Vascular: No hyperdense vessel. Calcified atherosclerosis present at the skull base. Skull: No discernible scalp soft tissue injury.  Calvarium intact. Sinuses/Orbits: Globes orbital soft tissues within normal limits. Paranasal sinuses and mastoid air cells are clear. Other: None. CT CERVICAL SPINE FINDINGS Alignment: Levoscoliosis with straightening of the normal cervical lordosis. Trace anterolisthesis of C4 on C5, C5 on C6, and C7 on T1. Skull base and vertebrae: Skull base intact. Mild rotation of C1 on C2 likely positional. Normal C1-2 articulations otherwise preserved. Dens intact. Vertebral body heights maintained. No acute fracture. Soft tissues and spinal canal: Paraspinous soft tissues demonstrate no acute abnormality. No abnormal prevertebral edema. Spinal canal within normal limits. Advanced atherosclerotic plaque about the carotid bifurcations. Disc levels: Advanced multilevel facet arthropathy seen throughout the cervical spine. Moderate multilevel degenerative spondylolysis, most notable at C6-7. Upper chest: Partially visualized upper chest demonstrates no acute abnormality. Partially visualized lung apices are grossly clear. Paraseptal emphysematous changes noted. Other: None. IMPRESSION: CT BRAIN: 1. No acute intracranial abnormality. 2. Moderate age-related cerebral atrophy with chronic small vessel ischemic disease. CT CERVICAL SPINE: 1. No acute traumatic injury within the cervical spine. 2. Levoscoliosis with moderate multilevel degenerative spondylolysis and facet arthrosis. Electronically Signed   By: Jeannine Boga M.D.   On: 11/04/2017 03:21    Procedures Procedures (including critical care time)  Medications Ordered in ED Medications - No data to display   Initial Impression / Assessment and Plan / ED Course  I have reviewed the triage vital signs and the nursing notes.  Pertinent labs & imaging  results that were available during my care of the patient were reviewed by me and considered in my medical decision making (see chart for details).     Patient presents to the ER for evaluation after a fall.  Patient has a history of Parkinson's which likely caused his fall.  There was no loss of consciousness.  He has, however, on anticoagulation because of a history of atrial fibrillation.  CT head and cervical spine performed, no acute injury.  Will update Dr. Terrence Dupont, his cardiologist about these recent falls to help and make decisions about patient's continued anticoagulation.  Final Clinical Impressions(s) / ED Diagnoses  Final diagnoses:  Injury of head, initial encounter  Contusion of scalp, initial encounter    ED Discharge Orders    None       Jacobey Gura, Gwenyth Allegra, MD 11/04/17 [phone]

## 2017-11-04 NOTE — ED Notes (Signed)
[facility] called for transport 

## 2017-11-04 NOTE — ED Triage Notes (Signed)
Pt to ED via [facility] after reported losing his balance while walking to the bathroom.  Pt fell backwards hitting his head on door frame.  Pt has small abrasion to back of head.  No LOC  Pt is from [place] ([facility])

## 2017-11-04 NOTE — ED Notes (Signed)
Pt resting quietly with no complaints at this time.

## 2017-11-09 DIAGNOSIS — I2510 Atherosclerotic heart disease of native coronary artery without angina pectoris: Secondary | ICD-10-CM | POA: Diagnosis not present

## 2017-11-09 DIAGNOSIS — G20 Parkinson's disease: Secondary | ICD-10-CM | POA: Diagnosis not present

## 2017-11-09 DIAGNOSIS — I509 Heart failure, unspecified: Secondary | ICD-10-CM | POA: Diagnosis not present

## 2017-11-09 DIAGNOSIS — M48062 Spinal stenosis, lumbar region with neurogenic claudication: Secondary | ICD-10-CM | POA: Diagnosis not present

## 2017-11-09 DIAGNOSIS — I110 Hypertensive heart disease with heart failure: Secondary | ICD-10-CM | POA: Diagnosis not present

## 2017-11-09 DIAGNOSIS — R296 Repeated falls: Secondary | ICD-10-CM | POA: Diagnosis not present

## 2017-11-10 DIAGNOSIS — I2510 Atherosclerotic heart disease of native coronary artery without angina pectoris: Secondary | ICD-10-CM | POA: Diagnosis not present

## 2017-11-10 DIAGNOSIS — I509 Heart failure, unspecified: Secondary | ICD-10-CM | POA: Diagnosis not present

## 2017-11-10 DIAGNOSIS — G20 Parkinson's disease: Secondary | ICD-10-CM | POA: Diagnosis not present

## 2017-11-10 DIAGNOSIS — I110 Hypertensive heart disease with heart failure: Secondary | ICD-10-CM | POA: Diagnosis not present

## 2017-11-10 DIAGNOSIS — M48062 Spinal stenosis, lumbar region with neurogenic claudication: Secondary | ICD-10-CM | POA: Diagnosis not present

## 2017-11-10 DIAGNOSIS — R296 Repeated falls: Secondary | ICD-10-CM | POA: Diagnosis not present

## 2017-11-16 DIAGNOSIS — R296 Repeated falls: Secondary | ICD-10-CM | POA: Diagnosis not present

## 2017-11-16 DIAGNOSIS — I509 Heart failure, unspecified: Secondary | ICD-10-CM | POA: Diagnosis not present

## 2017-11-16 DIAGNOSIS — G20 Parkinson's disease: Secondary | ICD-10-CM | POA: Diagnosis not present

## 2017-11-16 DIAGNOSIS — I2510 Atherosclerotic heart disease of native coronary artery without angina pectoris: Secondary | ICD-10-CM | POA: Diagnosis not present

## 2017-11-16 DIAGNOSIS — M48062 Spinal stenosis, lumbar region with neurogenic claudication: Secondary | ICD-10-CM | POA: Diagnosis not present

## 2017-11-16 DIAGNOSIS — I110 Hypertensive heart disease with heart failure: Secondary | ICD-10-CM | POA: Diagnosis not present

## 2017-11-17 DIAGNOSIS — G20 Parkinson's disease: Secondary | ICD-10-CM | POA: Diagnosis not present

## 2017-11-17 DIAGNOSIS — M48062 Spinal stenosis, lumbar region with neurogenic claudication: Secondary | ICD-10-CM | POA: Diagnosis not present

## 2017-11-17 DIAGNOSIS — I2510 Atherosclerotic heart disease of native coronary artery without angina pectoris: Secondary | ICD-10-CM | POA: Diagnosis not present

## 2017-11-17 DIAGNOSIS — R296 Repeated falls: Secondary | ICD-10-CM | POA: Diagnosis not present

## 2017-11-17 DIAGNOSIS — I110 Hypertensive heart disease with heart failure: Secondary | ICD-10-CM | POA: Diagnosis not present

## 2017-11-17 DIAGNOSIS — I509 Heart failure, unspecified: Secondary | ICD-10-CM | POA: Diagnosis not present

## 2017-11-19 DIAGNOSIS — I2510 Atherosclerotic heart disease of native coronary artery without angina pectoris: Secondary | ICD-10-CM | POA: Diagnosis not present

## 2017-11-19 DIAGNOSIS — R296 Repeated falls: Secondary | ICD-10-CM | POA: Diagnosis not present

## 2017-11-19 DIAGNOSIS — I509 Heart failure, unspecified: Secondary | ICD-10-CM | POA: Diagnosis not present

## 2017-11-19 DIAGNOSIS — G20 Parkinson's disease: Secondary | ICD-10-CM | POA: Diagnosis not present

## 2017-11-19 DIAGNOSIS — I110 Hypertensive heart disease with heart failure: Secondary | ICD-10-CM | POA: Diagnosis not present

## 2017-11-19 DIAGNOSIS — M48062 Spinal stenosis, lumbar region with neurogenic claudication: Secondary | ICD-10-CM | POA: Diagnosis not present

## 2017-11-23 DIAGNOSIS — G20 Parkinson's disease: Secondary | ICD-10-CM | POA: Diagnosis not present

## 2017-11-23 DIAGNOSIS — I110 Hypertensive heart disease with heart failure: Secondary | ICD-10-CM | POA: Diagnosis not present

## 2017-11-23 DIAGNOSIS — I2510 Atherosclerotic heart disease of native coronary artery without angina pectoris: Secondary | ICD-10-CM | POA: Diagnosis not present

## 2017-11-23 DIAGNOSIS — I509 Heart failure, unspecified: Secondary | ICD-10-CM | POA: Diagnosis not present

## 2017-11-23 DIAGNOSIS — R296 Repeated falls: Secondary | ICD-10-CM | POA: Diagnosis not present

## 2017-11-23 DIAGNOSIS — M48062 Spinal stenosis, lumbar region with neurogenic claudication: Secondary | ICD-10-CM | POA: Diagnosis not present

## 2017-11-25 DIAGNOSIS — R296 Repeated falls: Secondary | ICD-10-CM | POA: Diagnosis not present

## 2017-11-25 DIAGNOSIS — G20 Parkinson's disease: Secondary | ICD-10-CM | POA: Diagnosis not present

## 2017-11-25 DIAGNOSIS — I509 Heart failure, unspecified: Secondary | ICD-10-CM | POA: Diagnosis not present

## 2017-11-25 DIAGNOSIS — I110 Hypertensive heart disease with heart failure: Secondary | ICD-10-CM | POA: Diagnosis not present

## 2017-11-25 DIAGNOSIS — M48062 Spinal stenosis, lumbar region with neurogenic claudication: Secondary | ICD-10-CM | POA: Diagnosis not present

## 2017-11-25 DIAGNOSIS — I2510 Atherosclerotic heart disease of native coronary artery without angina pectoris: Secondary | ICD-10-CM | POA: Diagnosis not present

## 2017-11-26 DIAGNOSIS — I2510 Atherosclerotic heart disease of native coronary artery without angina pectoris: Secondary | ICD-10-CM | POA: Diagnosis not present

## 2017-11-26 DIAGNOSIS — M48062 Spinal stenosis, lumbar region with neurogenic claudication: Secondary | ICD-10-CM | POA: Diagnosis not present

## 2017-11-26 DIAGNOSIS — I110 Hypertensive heart disease with heart failure: Secondary | ICD-10-CM | POA: Diagnosis not present

## 2017-11-26 DIAGNOSIS — I509 Heart failure, unspecified: Secondary | ICD-10-CM | POA: Diagnosis not present

## 2017-11-26 DIAGNOSIS — R296 Repeated falls: Secondary | ICD-10-CM | POA: Diagnosis not present

## 2017-11-26 DIAGNOSIS — G20 Parkinson's disease: Secondary | ICD-10-CM | POA: Diagnosis not present

## 2017-11-30 DIAGNOSIS — R296 Repeated falls: Secondary | ICD-10-CM | POA: Diagnosis not present

## 2017-11-30 DIAGNOSIS — I2510 Atherosclerotic heart disease of native coronary artery without angina pectoris: Secondary | ICD-10-CM | POA: Diagnosis not present

## 2017-11-30 DIAGNOSIS — G20 Parkinson's disease: Secondary | ICD-10-CM | POA: Diagnosis not present

## 2017-11-30 DIAGNOSIS — I110 Hypertensive heart disease with heart failure: Secondary | ICD-10-CM | POA: Diagnosis not present

## 2017-11-30 DIAGNOSIS — I509 Heart failure, unspecified: Secondary | ICD-10-CM | POA: Diagnosis not present

## 2017-11-30 DIAGNOSIS — M48062 Spinal stenosis, lumbar region with neurogenic claudication: Secondary | ICD-10-CM | POA: Diagnosis not present

## 2017-12-02 DIAGNOSIS — R296 Repeated falls: Secondary | ICD-10-CM | POA: Diagnosis not present

## 2017-12-02 DIAGNOSIS — G20 Parkinson's disease: Secondary | ICD-10-CM | POA: Diagnosis not present

## 2017-12-02 DIAGNOSIS — I509 Heart failure, unspecified: Secondary | ICD-10-CM | POA: Diagnosis not present

## 2017-12-02 DIAGNOSIS — I110 Hypertensive heart disease with heart failure: Secondary | ICD-10-CM | POA: Diagnosis not present

## 2017-12-02 DIAGNOSIS — I2510 Atherosclerotic heart disease of native coronary artery without angina pectoris: Secondary | ICD-10-CM | POA: Diagnosis not present

## 2017-12-02 DIAGNOSIS — M48062 Spinal stenosis, lumbar region with neurogenic claudication: Secondary | ICD-10-CM | POA: Diagnosis not present

## 2017-12-07 DIAGNOSIS — M48062 Spinal stenosis, lumbar region with neurogenic claudication: Secondary | ICD-10-CM | POA: Diagnosis not present

## 2017-12-07 DIAGNOSIS — I509 Heart failure, unspecified: Secondary | ICD-10-CM | POA: Diagnosis not present

## 2017-12-07 DIAGNOSIS — I2510 Atherosclerotic heart disease of native coronary artery without angina pectoris: Secondary | ICD-10-CM | POA: Diagnosis not present

## 2017-12-07 DIAGNOSIS — G20 Parkinson's disease: Secondary | ICD-10-CM | POA: Diagnosis not present

## 2017-12-07 DIAGNOSIS — R296 Repeated falls: Secondary | ICD-10-CM | POA: Diagnosis not present

## 2017-12-07 DIAGNOSIS — I110 Hypertensive heart disease with heart failure: Secondary | ICD-10-CM | POA: Diagnosis not present

## 2017-12-08 DIAGNOSIS — M48062 Spinal stenosis, lumbar region with neurogenic claudication: Secondary | ICD-10-CM | POA: Diagnosis not present

## 2017-12-08 DIAGNOSIS — I110 Hypertensive heart disease with heart failure: Secondary | ICD-10-CM | POA: Diagnosis not present

## 2017-12-08 DIAGNOSIS — I509 Heart failure, unspecified: Secondary | ICD-10-CM | POA: Diagnosis not present

## 2017-12-08 DIAGNOSIS — I2510 Atherosclerotic heart disease of native coronary artery without angina pectoris: Secondary | ICD-10-CM | POA: Diagnosis not present

## 2017-12-08 DIAGNOSIS — R296 Repeated falls: Secondary | ICD-10-CM | POA: Diagnosis not present

## 2017-12-08 DIAGNOSIS — G20 Parkinson's disease: Secondary | ICD-10-CM | POA: Diagnosis not present

## 2017-12-10 DIAGNOSIS — I2510 Atherosclerotic heart disease of native coronary artery without angina pectoris: Secondary | ICD-10-CM | POA: Diagnosis not present

## 2017-12-10 DIAGNOSIS — G20 Parkinson's disease: Secondary | ICD-10-CM | POA: Diagnosis not present

## 2017-12-10 DIAGNOSIS — I110 Hypertensive heart disease with heart failure: Secondary | ICD-10-CM | POA: Diagnosis not present

## 2017-12-10 DIAGNOSIS — I509 Heart failure, unspecified: Secondary | ICD-10-CM | POA: Diagnosis not present

## 2017-12-10 DIAGNOSIS — R296 Repeated falls: Secondary | ICD-10-CM | POA: Diagnosis not present

## 2017-12-10 DIAGNOSIS — M48062 Spinal stenosis, lumbar region with neurogenic claudication: Secondary | ICD-10-CM | POA: Diagnosis not present

## 2017-12-11 ENCOUNTER — Other Ambulatory Visit: Payer: Self-pay

## 2017-12-11 ENCOUNTER — Emergency Department (HOSPITAL_COMMUNITY)
Admission: EM | Admit: 2017-12-11 | Discharge: 2017-12-12 | Disposition: A | Discharge status: Home / Self Care | Payer: Medicare Other | Attending: Emergency Medicine | Admitting: Emergency Medicine

## 2017-12-11 ENCOUNTER — Emergency Department (HOSPITAL_COMMUNITY): Payer: Medicare Other

## 2017-12-11 ENCOUNTER — Encounter (HOSPITAL_COMMUNITY): Payer: Self-pay | Admitting: Emergency Medicine

## 2017-12-11 DIAGNOSIS — K5900 Constipation, unspecified: Secondary | ICD-10-CM | POA: Insufficient documentation

## 2017-12-11 DIAGNOSIS — K5909 Other constipation: Secondary | ICD-10-CM

## 2017-12-11 DIAGNOSIS — I509 Heart failure, unspecified: Secondary | ICD-10-CM | POA: Insufficient documentation

## 2017-12-11 DIAGNOSIS — Z79899 Other long term (current) drug therapy: Secondary | ICD-10-CM | POA: Insufficient documentation

## 2017-12-11 DIAGNOSIS — I110 Hypertensive heart disease with heart failure: Secondary | ICD-10-CM | POA: Insufficient documentation

## 2017-12-11 DIAGNOSIS — R1084 Generalized abdominal pain: Secondary | ICD-10-CM | POA: Diagnosis not present

## 2017-12-11 LAB — LIPASE, BLOOD: Lipase: 27 U/L (ref 11–51)

## 2017-12-11 LAB — COMPREHENSIVE METABOLIC PANEL
ALT: 16 U/L — ABNORMAL LOW (ref 17–63)
AST: 24 U/L (ref 15–41)
Albumin: 3.3 g/dL — ABNORMAL LOW (ref 3.5–5.0)
Alkaline Phosphatase: 63 U/L (ref 38–126)
Anion gap: 9 (ref 5–15)
BUN: 22 mg/dL — ABNORMAL HIGH (ref 6–20)
CO2: 24 mmol/L (ref 22–32)
Calcium: 8.8 mg/dL — ABNORMAL LOW (ref 8.9–10.3)
Chloride: 105 mmol/L (ref 101–111)
Creatinine, Ser: 1.38 mg/dL — ABNORMAL HIGH (ref 0.61–1.24)
GFR calc Af Amer: 51 mL/min — ABNORMAL LOW (ref >60)
GFR calc non Af Amer: 44 mL/min — ABNORMAL LOW (ref >60)
Glucose, Bld: 161 mg/dL — ABNORMAL HIGH (ref 65–99)
Potassium: 3.7 mmol/L (ref 3.5–5.1)
Sodium: 138 mmol/L (ref 135–145)
Total Bilirubin: 0.7 mg/dL (ref 0.3–1.2)
Total Protein: 6.9 g/dL (ref 6.5–8.1)

## 2017-12-11 LAB — CBC
HCT: 33.5 % — ABNORMAL LOW (ref 39.0–52.0)
Hemoglobin: 10.8 g/dL — ABNORMAL LOW (ref 13.0–17.0)
MCH: 30.9 pg (ref 26.0–34.0)
MCHC: 32.2 g/dL (ref 30.0–36.0)
MCV: 95.7 fL (ref 78.0–100.0)
Platelets: 150 10*3/uL (ref 150–400)
RBC: 3.5 MIL/uL — ABNORMAL LOW (ref 4.22–5.81)
RDW: 15 % (ref 11.5–15.5)
WBC: 4.3 10*3/uL (ref 4.0–10.5)

## 2017-12-11 NOTE — ED Provider Notes (Signed)
Patient placed in Quick Look pathway, seen and evaluated   Chief Complaint: Constipation  HPI:   84 y.o. male who presents the emergency department today for constipation.  Patient states that he has not had a bowel movement since Saturday.  He notes that he is been having some associated lower abdominal pressure with this but no pain.  Patient states that he has a history of this with the last occurrence approximately 3 weeks ago where he had to have digital disimpaction.  Patient states he is on narcotic medication.  He has been trying Dulcolax for symptoms without relief.  Patient denies any fever, chills, chest pain, shortness of breath, abdominal pain, nausea/vomiting/diarrhea, melena, hematochezia.  No previous abdominal surgeries.  ROS:  Positive ROS: (+) Constipation, abdominal pressure Negative ROS: (-) Abdominal pain, nausea/vomiting  Physical Exam:   Gen: No distress  Neuro: Awake and Alert  Skin: Warm    Focused Exam: Abdominal: Appearance normal. No erythema, jaundice or ascites. Abdomen is soft and non-tender to palpation. No rebound or guarding. Bowel sounds are present in all four quadrants. No distension.   BP (!) 123/56 (BP Location: Left Arm)   Pulse (!) 59   Temp 98.2 F (36.8 C) (Oral)   Resp 18   SpO2 100%   Plan:  Based on initial evaluation, labs ARE indicated and radiology studies ARE indicated.  Patient counseled on process, plan, and necessity for staying for completing the evaluation."  Initiation of care has begun. The patient has been counseled on the process, plan, and necessity for staying for the completion/evaluation, and the remainder of the medical screening examination    Lorelle Gibbs 12/11/17 1456    Fatima Blank, MD 12/11/17 2239

## 2017-12-11 NOTE — ED Triage Notes (Addendum)
Patient reports last bowel movement on Saturday or Sunday. Denies any bowel movement at all since his last one. Patient states he has been taking Miralax every day since before the constipation started.

## 2017-12-12 DIAGNOSIS — K5900 Constipation, unspecified: Secondary | ICD-10-CM | POA: Diagnosis not present

## 2017-12-12 LAB — URINALYSIS, ROUTINE W REFLEX MICROSCOPIC
Bilirubin Urine: NEGATIVE
Glucose, UA: NEGATIVE mg/dL
Hgb urine dipstick: NEGATIVE
Ketones, ur: NEGATIVE mg/dL
Nitrite: NEGATIVE
Protein, ur: NEGATIVE mg/dL
Specific Gravity, Urine: 1.006 (ref 1.005–1.030)
pH: 6 (ref 5.0–8.0)

## 2017-12-12 NOTE — ED Notes (Signed)
Pt tolerated enema producing a large amount of solid stool.

## 2017-12-12 NOTE — Discharge Instructions (Addendum)
I recommend that you increase your water and fiber intake. If you are not able to eat foods high in fiber, you may use Benefiber or Metamucil over-the-counter. I also recommend you use MiraLAX 1-2 times a day and Colace 100 mg twice a day to help with bowel movements. These medications are over the counter.  You may use other over-the-counter medications such as Dulcolax, Fleet enemas, magnesium citrate as needed for constipation. Please note that some of these medications may cause you to have abdominal cramping which is normal. If you develop severe abdominal pain, fever, vomiting, distention of your abdomen, unable to have a bowel movement for 5 days or are not passing gas, please return to the hospital.

## 2017-12-12 NOTE — ED Provider Notes (Signed)
TIME SEEN: 12:09 AM  CHIEF COMPLAINT: Constipation  HPI: Patient is an 84 year old male with history of CHF, hypertension, atrial fibrillation, chronic kidney disease, Parkinson's who presents to the emergency department with constipation.  Reports no bowel movement in 1 has been passing gas.  No nausea or vomiting.  No fever.  Reports having abdominal cramping.  No history of abdominal surgeries or bowel obstruction in the past.  Has been trying MiraLAX daily at home and magnesium citrate once without relief.  ROS: See HPI Constitutional: no fever  Eyes: no drainage  ENT: no runny nose   Cardiovascular:  no chest pain  Resp: no SOB  GI: no vomiting GU: no dysuria Integumentary: no rash  Allergy: no hives  Musculoskeletal: no leg swelling  Neurological: no slurred speech ROS otherwise negative  PAST MEDICAL HISTORY/PAST SURGICAL HISTORY:  Past Medical History:  Diagnosis Date  . Arthritis    incl spinal stenosis  . Atrial fibrillation ([place])   . CHF (congestive heart failure) ([place])   . CHF (congestive heart failure) ([place])   . HTN (hypertension)   . Parkinson disease ([place])   . Renal insufficiency    question degree  . Wears glasses    reading  . Wears partial dentures    upper partial    MEDICATIONS:  Prior to Admission medications   Medication Sig Start Date End Date Taking? Authorizing Provider  acetaminophen (TYLENOL) 500 MG tablet Take 1,000 mg by mouth 2 (two) times daily. scheduled    [provider]  amoxicillin (AMOXIL) 500 MG capsule Take 500 mg by mouth See admin instructions. Take 4 capsules (2000 mg) by mouth prior to dental appointment    [provider]  Ascorbic Acid (VITAMIN C) 1000 MG tablet Take 1,000 mg by mouth daily.    [provider]  b complex vitamins tablet Take 1 tablet by mouth daily.    [provider]  beta carotene w/minerals (OCUVITE) tablet Take 1 tablet by mouth 2 (two) times daily.    [provider]  bisacodyl (DULCOLAX) 5 MG EC tablet Take 10 mg by mouth daily as needed for moderate constipation.    [provider]  CALCIUM CITRATE PO Take 1 tablet by mouth daily.     [provider]  carbidopa-levodopa (SINEMET IR) 25-100 MG tablet Take 0.5 tablets by mouth 3 (three) times daily. 12/09/16    Givens, NP  carvedilol (COREG) 6.25 MG tablet Take 1 tablet (6.25 mg total) by mouth 2 (two) times daily with a meal. 07/01/17   Seiler, Amber K, NP  Cascara [place] 450 MG CAPS Take 450 mg by mouth daily as needed (constipation).    [provider]  Cholecalciferol (VITAMIN D-3) 5000 UNITS TABS Take 5,000 Units by mouth daily.    [provider]  cyclobenzaprine (FLEXERIL) 10 MG tablet Take 1 tablet (10 mg total) by mouth 2 (two) times daily as needed for muscle spasms. 06/18/14   Szekalski, Kaitlyn, [name]  docusate sodium (COLACE) 100 MG capsule Take 100 mg by mouth daily. 07/22/16   [provider]  finasteride (PROSCAR) 5 MG tablet Take 5 mg by mouth daily.     [provider]  fluticasone (VERAMYST) 27.5 MCG/SPRAY nasal spray Place 2 sprays into the nose daily.    [provider]  furosemide (LASIX) 20 MG tablet Take 20 mg by mouth daily.    [provider]  HYDROcodone-acetaminophen (NORCO/VICODIN) 5-325 MG per tablet Take 1-2 tablets by mouth  every 4 (four) hours as needed for moderate pain or severe pain. 06/18/14   [name], [name]  LISINOPRIL PO Take 1 tablet by mouth daily.     [provider]  Mouthwashes (BIOTENE DRY MOUTH MT) Use as directed 10 mLs in the mouth or throat daily as needed (dry mouth).    [provider]  Multiple Vitamin (MULITIVITAMIN WITH MINERALS) TABS Take 1 tablet by mouth daily.     [provider]  Omega-3 Fatty Acids (FISH OIL) 1200 MG CAPS Take 2,400 mg by mouth 2 (two) times daily.    [provider]  oxybutynin (DITROPAN-XL) 5 MG 24  hr tablet Take 5 mg by mouth at bedtime.    [provider]  polyvinyl alcohol (LIQUIFILM TEARS) 1.4 % ophthalmic solution Place 1 drop into both eyes 5 (five) times daily as needed for dry eyes.     [provider]  pravastatin (PRAVACHOL) 40 MG tablet Take 40 mg by mouth at bedtime.    [provider]  Rivaroxaban 15 & 20 MG TBPK Take 15-20 mg by mouth daily. 01/27/17   [provider]  sacubitril-valsartan (ENTRESTO) 24-26 MG Take 1 tablet by mouth 2 (two) times daily.    [provider]  Sodium Fluoride (FLUORIDEX DAILY DEFENSE DT) Place 1 application onto teeth daily.    [provider]  Tamsulosin HCl (FLOMAX) 0.4 MG CAPS Take 0.4 mg by mouth 2 (two) times daily.     [provider]  traMADol (ULTRAM) 50 MG tablet Take 100 mg by mouth 2 (two) times daily. scheduled    [provider]  zolpidem (AMBIEN) 10 MG tablet Take 10 mg by mouth at bedtime as needed for sleep.     [provider]  buPROPion (WELLBUTRIN) 100 MG tablet Take 200 mg by mouth daily.    10/13/11  [provider]    ALLERGIES:  Allergies  Allergen Reactions  . Amiodarone Other (See Comments)     fluid on lungs    SOCIAL HISTORY:  Social History   Tobacco Use  . Smoking status: Former Smoker    Types: Cigarettes    Last attempt to quit: 05/13/1971    Years since quitting: 46.6  . Smokeless tobacco: Never Used  Substance Use Topics  . Alcohol use: No    Frequency: Never    Comment: occasional    FAMILY HISTORY: No family history on file.  EXAM: BP (!) 145/62 (BP Location: Right Arm)   Pulse 60   Temp 98.2 F (36.8 C) (Oral)   Resp 14   SpO2 98%  CONSTITUTIONAL: Alert and oriented and responds appropriately to questions. Well-appearing; well-nourished HEAD: Normocephalic EYES: Conjunctivae clear, pupils appear equal, EOMI ENT: normal nose; moist mucous membranes NECK: Supple, no meningismus, no nuchal rigidity, no  LAD  CARD: RRR; S1 and S2 appreciated; no murmurs, no clicks, no rubs, no gallops RESP: Normal chest excursion without splinting or tachypnea; breath sounds clear and equal bilaterally; no wheezes, no rhonchi, no rales, no hypoxia or respiratory distress, speaking full sentences ABD/GI: Normal bowel sounds; non-distended; soft, widely tender throughout the lower abdomen r, no rebound, no guarding, no peritoneal signs, no hepatosplenomegaly RECTAL:  Normal rectal tone, no gross blood or melena, no hemorrhoids appreciated, nontender rectal exam, large amount of hard stool in the rectal vault BACK:  The back appears normal and is non-tender to palpation, there is no CVA tenderness EXT: Normal ROM in all joints; non-tender to palpation;  no edema; normal capillary refill; no cyanosis, no calf tenderness or swelling    SKIN: Normal color for age and race; warm; no rash NEURO: Moves all extremities equally PSYCH: The patient's mood and manner are appropriate. Grooming and personal hygiene are appropriate.  MEDICAL DECISION MAKING: Patient here with constipation.  Labs obtained in triage are unremarkable other than mildly elevated creatinine which is his baseline.  Abdominal exam is benign.  Abdominal x-ray shows constipation.  No sign of bowel obstruction on x-ray or clinically.  Will give soapsuds enema to see if we can relieve his constipation and fecal impaction.  ED PROGRESS: Patient has been able to have a large bowel movement after soapsuds enema.  I feel he is safe to be discharged home and continue regimen of Colace and MiraLAX at home daily.  Urine does show small leukocytes and many bacteria but no other sign of infection.  We will send urine culture but will hold off on treatment at this time.  At this time, I do not feel there is any life-threatening condition present. I have reviewed and discussed all results (EKG, imaging, lab, urine as appropriate) and exam findings with patient/family. I have  reviewed nursing notes and appropriate previous records.  I feel the patient is safe to be discharged home without further emergent workup and can continue workup as an outpatient as needed. Discussed usual and customary return precautions. Patient/family verbalize understanding and are comfortable with this plan.  Outpatient follow-up has been provided if needed. All questions have been answered.      Melat Wrisley, Delice Bison, DO 12/12/17 [phone]

## 2017-12-12 NOTE — ED Notes (Signed)
Pt requested cranberry juice and was given the same

## 2017-12-14 DIAGNOSIS — K5900 Constipation, unspecified: Secondary | ICD-10-CM | POA: Diagnosis not present

## 2017-12-14 DIAGNOSIS — I110 Hypertensive heart disease with heart failure: Secondary | ICD-10-CM | POA: Diagnosis not present

## 2017-12-14 DIAGNOSIS — R260 Ataxic gait: Secondary | ICD-10-CM | POA: Diagnosis not present

## 2017-12-14 DIAGNOSIS — R296 Repeated falls: Secondary | ICD-10-CM | POA: Diagnosis not present

## 2017-12-14 DIAGNOSIS — G20 Parkinson's disease: Secondary | ICD-10-CM | POA: Diagnosis not present

## 2017-12-14 DIAGNOSIS — M4807 Spinal stenosis, lumbosacral region: Secondary | ICD-10-CM | POA: Diagnosis not present

## 2017-12-14 DIAGNOSIS — I2510 Atherosclerotic heart disease of native coronary artery without angina pectoris: Secondary | ICD-10-CM | POA: Diagnosis not present

## 2017-12-14 DIAGNOSIS — Z86718 Personal history of other venous thrombosis and embolism: Secondary | ICD-10-CM | POA: Diagnosis not present

## 2017-12-14 DIAGNOSIS — M48062 Spinal stenosis, lumbar region with neurogenic claudication: Secondary | ICD-10-CM | POA: Diagnosis not present

## 2017-12-14 DIAGNOSIS — I509 Heart failure, unspecified: Secondary | ICD-10-CM | POA: Diagnosis not present

## 2017-12-14 DIAGNOSIS — I4891 Unspecified atrial fibrillation: Secondary | ICD-10-CM | POA: Diagnosis not present

## 2017-12-14 LAB — URINE CULTURE: Culture: >=100000 — AB

## 2017-12-15 ENCOUNTER — Telehealth: Payer: Self-pay | Admitting: *Deleted

## 2017-12-15 DIAGNOSIS — I2510 Atherosclerotic heart disease of native coronary artery without angina pectoris: Secondary | ICD-10-CM | POA: Diagnosis not present

## 2017-12-15 DIAGNOSIS — M48062 Spinal stenosis, lumbar region with neurogenic claudication: Secondary | ICD-10-CM | POA: Diagnosis not present

## 2017-12-15 DIAGNOSIS — R296 Repeated falls: Secondary | ICD-10-CM | POA: Diagnosis not present

## 2017-12-15 DIAGNOSIS — G20 Parkinson's disease: Secondary | ICD-10-CM | POA: Diagnosis not present

## 2017-12-15 DIAGNOSIS — I110 Hypertensive heart disease with heart failure: Secondary | ICD-10-CM | POA: Diagnosis not present

## 2017-12-15 DIAGNOSIS — I509 Heart failure, unspecified: Secondary | ICD-10-CM | POA: Diagnosis not present

## 2017-12-15 NOTE — Telephone Encounter (Signed)
Post ED Visit - Positive Culture Follow-up  Culture report reviewed by antimicrobial stewardship pharmacist:  []  Elenor Quinones, Pharm.D. []  Heide Guile, Pharm.D., BCPS AQ-ID [x]  Parks Neptune, Pharm.D., BCPS []  Alycia Rossetti, Pharm.D., BCPS []  [place], [place].D., BCPS, AAHIVP []  Legrand Como, Pharm.D., BCPS, AAHIVP []  Salome Arnt, PharmD, BCPS []  Wynell Balloon, PharmD []  Vincenza Hews, PharmD, BCPS  Positive urine culture, reviewed by Ocie Cornfield, [name] No antibiotics indicatedand no further patient follow-up is required at this time.  Harlon Flor [facility] 12/15/2017, 9:42 AM

## 2017-12-15 NOTE — Progress Notes (Signed)
ED Antimicrobial Stewardship Positive Culture Follow Up   [name] is an 84 y.o. male who presented to [facility] on 12/11/2017 with a chief complaint of  Chief Complaint  Patient presents with  . Constipation    Recent Results (from the past 720 hour(s))  Urine culture     Status: Abnormal   Collection Time: 12/12/17  3:05 AM  Result Value Ref Range Status   Specimen Description URINE, RANDOM  Final   Special Requests   Final    NONE Performed at [facility], [address] [place], [place] [address]    Culture >=100,000 COLONIES/mL ENTEROCOCCUS FAECALIS (A)  Final   Report Status 12/14/2017 FINAL  Final   Organism ID, Bacteria ENTEROCOCCUS FAECALIS (A)  Final      Susceptibility   Enterococcus faecalis - MIC*    AMPICILLIN <=2 SENSITIVE Sensitive     LEVOFLOXACIN 1 SENSITIVE Sensitive     NITROFURANTOIN <=16 SENSITIVE Sensitive     VANCOMYCIN <=0.5 SENSITIVE Sensitive     * >=100,000 COLONIES/mL ENTEROCOCCUS FAECALIS   No abx indicated  ED Provider: Lorre Nick, [name]  Wynell Balloon 12/15/2017, 9:00 AM Infectious Diseases Pharmacist Phone# [phone]

## 2017-12-22 DIAGNOSIS — I509 Heart failure, unspecified: Secondary | ICD-10-CM | POA: Diagnosis not present

## 2017-12-22 DIAGNOSIS — R296 Repeated falls: Secondary | ICD-10-CM | POA: Diagnosis not present

## 2017-12-22 DIAGNOSIS — I110 Hypertensive heart disease with heart failure: Secondary | ICD-10-CM | POA: Diagnosis not present

## 2017-12-22 DIAGNOSIS — M48062 Spinal stenosis, lumbar region with neurogenic claudication: Secondary | ICD-10-CM | POA: Diagnosis not present

## 2017-12-22 DIAGNOSIS — I2510 Atherosclerotic heart disease of native coronary artery without angina pectoris: Secondary | ICD-10-CM | POA: Diagnosis not present

## 2017-12-22 DIAGNOSIS — G20 Parkinson's disease: Secondary | ICD-10-CM | POA: Diagnosis not present

## 2017-12-23 DIAGNOSIS — R296 Repeated falls: Secondary | ICD-10-CM | POA: Diagnosis not present

## 2017-12-23 DIAGNOSIS — I509 Heart failure, unspecified: Secondary | ICD-10-CM | POA: Diagnosis not present

## 2017-12-23 DIAGNOSIS — G20 Parkinson's disease: Secondary | ICD-10-CM | POA: Diagnosis not present

## 2017-12-23 DIAGNOSIS — I110 Hypertensive heart disease with heart failure: Secondary | ICD-10-CM | POA: Diagnosis not present

## 2017-12-23 DIAGNOSIS — I2510 Atherosclerotic heart disease of native coronary artery without angina pectoris: Secondary | ICD-10-CM | POA: Diagnosis not present

## 2017-12-23 DIAGNOSIS — M48062 Spinal stenosis, lumbar region with neurogenic claudication: Secondary | ICD-10-CM | POA: Diagnosis not present

## 2017-12-28 DIAGNOSIS — G20 Parkinson's disease: Secondary | ICD-10-CM | POA: Diagnosis not present

## 2017-12-28 DIAGNOSIS — I509 Heart failure, unspecified: Secondary | ICD-10-CM | POA: Diagnosis not present

## 2017-12-28 DIAGNOSIS — M48062 Spinal stenosis, lumbar region with neurogenic claudication: Secondary | ICD-10-CM | POA: Diagnosis not present

## 2017-12-28 DIAGNOSIS — R296 Repeated falls: Secondary | ICD-10-CM | POA: Diagnosis not present

## 2017-12-28 DIAGNOSIS — I110 Hypertensive heart disease with heart failure: Secondary | ICD-10-CM | POA: Diagnosis not present

## 2017-12-28 DIAGNOSIS — I2510 Atherosclerotic heart disease of native coronary artery without angina pectoris: Secondary | ICD-10-CM | POA: Diagnosis not present

## 2017-12-29 DIAGNOSIS — G20 Parkinson's disease: Secondary | ICD-10-CM | POA: Diagnosis not present

## 2017-12-29 DIAGNOSIS — M48062 Spinal stenosis, lumbar region with neurogenic claudication: Secondary | ICD-10-CM | POA: Diagnosis not present

## 2017-12-29 DIAGNOSIS — I110 Hypertensive heart disease with heart failure: Secondary | ICD-10-CM | POA: Diagnosis not present

## 2017-12-29 DIAGNOSIS — I509 Heart failure, unspecified: Secondary | ICD-10-CM | POA: Diagnosis not present

## 2017-12-29 DIAGNOSIS — R296 Repeated falls: Secondary | ICD-10-CM | POA: Diagnosis not present

## 2017-12-29 DIAGNOSIS — I2510 Atherosclerotic heart disease of native coronary artery without angina pectoris: Secondary | ICD-10-CM | POA: Diagnosis not present

## 2018-01-07 DIAGNOSIS — N401 Enlarged prostate with lower urinary tract symptoms: Secondary | ICD-10-CM | POA: Diagnosis not present

## 2018-01-07 DIAGNOSIS — W19XXXD Unspecified fall, subsequent encounter: Secondary | ICD-10-CM | POA: Diagnosis not present

## 2018-01-07 DIAGNOSIS — G20 Parkinson's disease: Secondary | ICD-10-CM | POA: Diagnosis not present

## 2018-01-07 DIAGNOSIS — R350 Frequency of micturition: Secondary | ICD-10-CM | POA: Diagnosis not present

## 2018-01-08 DIAGNOSIS — R2689 Other abnormalities of gait and mobility: Secondary | ICD-10-CM | POA: Diagnosis not present

## 2018-01-08 DIAGNOSIS — R1312 Dysphagia, oropharyngeal phase: Secondary | ICD-10-CM | POA: Diagnosis not present

## 2018-01-08 DIAGNOSIS — Z79899 Other long term (current) drug therapy: Secondary | ICD-10-CM | POA: Diagnosis not present

## 2018-01-08 DIAGNOSIS — R488 Other symbolic dysfunctions: Secondary | ICD-10-CM | POA: Diagnosis not present

## 2018-01-08 DIAGNOSIS — M6281 Muscle weakness (generalized): Secondary | ICD-10-CM | POA: Diagnosis not present

## 2018-01-11 DIAGNOSIS — R1312 Dysphagia, oropharyngeal phase: Secondary | ICD-10-CM | POA: Diagnosis not present

## 2018-01-11 DIAGNOSIS — M6281 Muscle weakness (generalized): Secondary | ICD-10-CM | POA: Diagnosis not present

## 2018-01-11 DIAGNOSIS — R488 Other symbolic dysfunctions: Secondary | ICD-10-CM | POA: Diagnosis not present

## 2018-01-11 DIAGNOSIS — R2689 Other abnormalities of gait and mobility: Secondary | ICD-10-CM | POA: Diagnosis not present

## 2018-01-12 DIAGNOSIS — R2689 Other abnormalities of gait and mobility: Secondary | ICD-10-CM | POA: Diagnosis not present

## 2018-01-12 DIAGNOSIS — G20 Parkinson's disease: Secondary | ICD-10-CM | POA: Diagnosis not present

## 2018-01-12 DIAGNOSIS — M6281 Muscle weakness (generalized): Secondary | ICD-10-CM | POA: Diagnosis not present

## 2018-01-12 DIAGNOSIS — R1312 Dysphagia, oropharyngeal phase: Secondary | ICD-10-CM | POA: Diagnosis not present

## 2018-01-12 DIAGNOSIS — W19XXXD Unspecified fall, subsequent encounter: Secondary | ICD-10-CM | POA: Diagnosis not present

## 2018-01-12 DIAGNOSIS — I482 Chronic atrial fibrillation: Secondary | ICD-10-CM | POA: Diagnosis not present

## 2018-01-12 DIAGNOSIS — R488 Other symbolic dysfunctions: Secondary | ICD-10-CM | POA: Diagnosis not present

## 2018-01-13 DIAGNOSIS — R488 Other symbolic dysfunctions: Secondary | ICD-10-CM | POA: Diagnosis not present

## 2018-01-13 DIAGNOSIS — R1312 Dysphagia, oropharyngeal phase: Secondary | ICD-10-CM | POA: Diagnosis not present

## 2018-01-13 DIAGNOSIS — R2689 Other abnormalities of gait and mobility: Secondary | ICD-10-CM | POA: Diagnosis not present

## 2018-01-13 DIAGNOSIS — M6281 Muscle weakness (generalized): Secondary | ICD-10-CM | POA: Diagnosis not present

## 2018-01-14 ENCOUNTER — Encounter: Payer: Medicare Other | Admitting: *Deleted

## 2018-01-14 ENCOUNTER — Telehealth: Payer: Self-pay | Admitting: Cardiology

## 2018-01-14 DIAGNOSIS — R1312 Dysphagia, oropharyngeal phase: Secondary | ICD-10-CM | POA: Diagnosis not present

## 2018-01-14 DIAGNOSIS — R2689 Other abnormalities of gait and mobility: Secondary | ICD-10-CM | POA: Diagnosis not present

## 2018-01-14 DIAGNOSIS — M6281 Muscle weakness (generalized): Secondary | ICD-10-CM | POA: Diagnosis not present

## 2018-01-14 DIAGNOSIS — R488 Other symbolic dysfunctions: Secondary | ICD-10-CM | POA: Diagnosis not present

## 2018-01-14 NOTE — Telephone Encounter (Signed)
LMOVM reminding pt to send remote transmission.   

## 2018-01-15 ENCOUNTER — Encounter: Payer: Self-pay | Admitting: Cardiology

## 2018-01-15 DIAGNOSIS — M6281 Muscle weakness (generalized): Secondary | ICD-10-CM | POA: Diagnosis not present

## 2018-01-15 DIAGNOSIS — R1312 Dysphagia, oropharyngeal phase: Secondary | ICD-10-CM | POA: Diagnosis not present

## 2018-01-15 DIAGNOSIS — R488 Other symbolic dysfunctions: Secondary | ICD-10-CM | POA: Diagnosis not present

## 2018-01-15 DIAGNOSIS — R2689 Other abnormalities of gait and mobility: Secondary | ICD-10-CM | POA: Diagnosis not present

## 2018-01-18 DIAGNOSIS — M6281 Muscle weakness (generalized): Secondary | ICD-10-CM | POA: Diagnosis not present

## 2018-01-18 DIAGNOSIS — R1312 Dysphagia, oropharyngeal phase: Secondary | ICD-10-CM | POA: Diagnosis not present

## 2018-01-18 DIAGNOSIS — R488 Other symbolic dysfunctions: Secondary | ICD-10-CM | POA: Diagnosis not present

## 2018-01-18 DIAGNOSIS — R2689 Other abnormalities of gait and mobility: Secondary | ICD-10-CM | POA: Diagnosis not present

## 2018-01-19 DIAGNOSIS — R488 Other symbolic dysfunctions: Secondary | ICD-10-CM | POA: Diagnosis not present

## 2018-01-19 DIAGNOSIS — R2689 Other abnormalities of gait and mobility: Secondary | ICD-10-CM | POA: Diagnosis not present

## 2018-01-19 DIAGNOSIS — R1312 Dysphagia, oropharyngeal phase: Secondary | ICD-10-CM | POA: Diagnosis not present

## 2018-01-19 DIAGNOSIS — M6281 Muscle weakness (generalized): Secondary | ICD-10-CM | POA: Diagnosis not present

## 2018-01-20 DIAGNOSIS — R1312 Dysphagia, oropharyngeal phase: Secondary | ICD-10-CM | POA: Diagnosis not present

## 2018-01-20 DIAGNOSIS — R2689 Other abnormalities of gait and mobility: Secondary | ICD-10-CM | POA: Diagnosis not present

## 2018-01-20 DIAGNOSIS — M6281 Muscle weakness (generalized): Secondary | ICD-10-CM | POA: Diagnosis not present

## 2018-01-20 DIAGNOSIS — R488 Other symbolic dysfunctions: Secondary | ICD-10-CM | POA: Diagnosis not present

## 2018-01-21 DIAGNOSIS — R2689 Other abnormalities of gait and mobility: Secondary | ICD-10-CM | POA: Diagnosis not present

## 2018-01-21 DIAGNOSIS — M6281 Muscle weakness (generalized): Secondary | ICD-10-CM | POA: Diagnosis not present

## 2018-01-21 DIAGNOSIS — R488 Other symbolic dysfunctions: Secondary | ICD-10-CM | POA: Diagnosis not present

## 2018-01-21 DIAGNOSIS — R1312 Dysphagia, oropharyngeal phase: Secondary | ICD-10-CM | POA: Diagnosis not present

## 2018-01-22 DIAGNOSIS — R2689 Other abnormalities of gait and mobility: Secondary | ICD-10-CM | POA: Diagnosis not present

## 2018-01-22 DIAGNOSIS — R1312 Dysphagia, oropharyngeal phase: Secondary | ICD-10-CM | POA: Diagnosis not present

## 2018-01-22 DIAGNOSIS — R488 Other symbolic dysfunctions: Secondary | ICD-10-CM | POA: Diagnosis not present

## 2018-01-22 DIAGNOSIS — M6281 Muscle weakness (generalized): Secondary | ICD-10-CM | POA: Diagnosis not present

## 2018-01-24 DIAGNOSIS — R2689 Other abnormalities of gait and mobility: Secondary | ICD-10-CM | POA: Diagnosis not present

## 2018-01-24 DIAGNOSIS — R488 Other symbolic dysfunctions: Secondary | ICD-10-CM | POA: Diagnosis not present

## 2018-01-24 DIAGNOSIS — R1312 Dysphagia, oropharyngeal phase: Secondary | ICD-10-CM | POA: Diagnosis not present

## 2018-01-24 DIAGNOSIS — M6281 Muscle weakness (generalized): Secondary | ICD-10-CM | POA: Diagnosis not present

## 2018-01-25 DIAGNOSIS — R1312 Dysphagia, oropharyngeal phase: Secondary | ICD-10-CM | POA: Diagnosis not present

## 2018-01-25 DIAGNOSIS — G20 Parkinson's disease: Secondary | ICD-10-CM | POA: Diagnosis not present

## 2018-01-25 DIAGNOSIS — M6281 Muscle weakness (generalized): Secondary | ICD-10-CM | POA: Diagnosis not present

## 2018-01-25 DIAGNOSIS — R488 Other symbolic dysfunctions: Secondary | ICD-10-CM | POA: Diagnosis not present

## 2018-01-25 DIAGNOSIS — R2689 Other abnormalities of gait and mobility: Secondary | ICD-10-CM | POA: Diagnosis not present

## 2018-01-26 DIAGNOSIS — R1312 Dysphagia, oropharyngeal phase: Secondary | ICD-10-CM | POA: Diagnosis not present

## 2018-01-26 DIAGNOSIS — R2689 Other abnormalities of gait and mobility: Secondary | ICD-10-CM | POA: Diagnosis not present

## 2018-01-26 DIAGNOSIS — R488 Other symbolic dysfunctions: Secondary | ICD-10-CM | POA: Diagnosis not present

## 2018-01-26 DIAGNOSIS — G20 Parkinson's disease: Secondary | ICD-10-CM | POA: Diagnosis not present

## 2018-01-26 DIAGNOSIS — Z79899 Other long term (current) drug therapy: Secondary | ICD-10-CM | POA: Diagnosis not present

## 2018-01-26 DIAGNOSIS — M6281 Muscle weakness (generalized): Secondary | ICD-10-CM | POA: Diagnosis not present

## 2018-01-27 DIAGNOSIS — M6281 Muscle weakness (generalized): Secondary | ICD-10-CM | POA: Diagnosis not present

## 2018-01-27 DIAGNOSIS — R488 Other symbolic dysfunctions: Secondary | ICD-10-CM | POA: Diagnosis not present

## 2018-01-27 DIAGNOSIS — R2689 Other abnormalities of gait and mobility: Secondary | ICD-10-CM | POA: Diagnosis not present

## 2018-01-27 DIAGNOSIS — R1312 Dysphagia, oropharyngeal phase: Secondary | ICD-10-CM | POA: Diagnosis not present

## 2018-01-27 DIAGNOSIS — G20 Parkinson's disease: Secondary | ICD-10-CM | POA: Diagnosis not present

## 2018-01-28 DIAGNOSIS — R488 Other symbolic dysfunctions: Secondary | ICD-10-CM | POA: Diagnosis not present

## 2018-01-28 DIAGNOSIS — R1312 Dysphagia, oropharyngeal phase: Secondary | ICD-10-CM | POA: Diagnosis not present

## 2018-01-28 DIAGNOSIS — R2689 Other abnormalities of gait and mobility: Secondary | ICD-10-CM | POA: Diagnosis not present

## 2018-01-28 DIAGNOSIS — M6281 Muscle weakness (generalized): Secondary | ICD-10-CM | POA: Diagnosis not present

## 2018-01-28 DIAGNOSIS — G20 Parkinson's disease: Secondary | ICD-10-CM | POA: Diagnosis not present

## 2018-01-29 DIAGNOSIS — M6281 Muscle weakness (generalized): Secondary | ICD-10-CM | POA: Diagnosis not present

## 2018-01-29 DIAGNOSIS — R488 Other symbolic dysfunctions: Secondary | ICD-10-CM | POA: Diagnosis not present

## 2018-01-29 DIAGNOSIS — R1312 Dysphagia, oropharyngeal phase: Secondary | ICD-10-CM | POA: Diagnosis not present

## 2018-01-29 DIAGNOSIS — R2689 Other abnormalities of gait and mobility: Secondary | ICD-10-CM | POA: Diagnosis not present

## 2018-01-29 DIAGNOSIS — G20 Parkinson's disease: Secondary | ICD-10-CM | POA: Diagnosis not present

## 2018-02-01 DIAGNOSIS — R2689 Other abnormalities of gait and mobility: Secondary | ICD-10-CM | POA: Diagnosis not present

## 2018-02-01 DIAGNOSIS — M6281 Muscle weakness (generalized): Secondary | ICD-10-CM | POA: Diagnosis not present

## 2018-02-01 DIAGNOSIS — G20 Parkinson's disease: Secondary | ICD-10-CM | POA: Diagnosis not present

## 2018-02-01 DIAGNOSIS — R488 Other symbolic dysfunctions: Secondary | ICD-10-CM | POA: Diagnosis not present

## 2018-02-01 DIAGNOSIS — R1312 Dysphagia, oropharyngeal phase: Secondary | ICD-10-CM | POA: Diagnosis not present

## 2018-02-02 DIAGNOSIS — R2689 Other abnormalities of gait and mobility: Secondary | ICD-10-CM | POA: Diagnosis not present

## 2018-02-02 DIAGNOSIS — R1312 Dysphagia, oropharyngeal phase: Secondary | ICD-10-CM | POA: Diagnosis not present

## 2018-02-02 DIAGNOSIS — G20 Parkinson's disease: Secondary | ICD-10-CM | POA: Diagnosis not present

## 2018-02-02 DIAGNOSIS — M6281 Muscle weakness (generalized): Secondary | ICD-10-CM | POA: Diagnosis not present

## 2018-02-02 DIAGNOSIS — R488 Other symbolic dysfunctions: Secondary | ICD-10-CM | POA: Diagnosis not present

## 2018-02-03 DIAGNOSIS — R2689 Other abnormalities of gait and mobility: Secondary | ICD-10-CM | POA: Diagnosis not present

## 2018-02-03 DIAGNOSIS — R488 Other symbolic dysfunctions: Secondary | ICD-10-CM | POA: Diagnosis not present

## 2018-02-03 DIAGNOSIS — G20 Parkinson's disease: Secondary | ICD-10-CM | POA: Diagnosis not present

## 2018-02-03 DIAGNOSIS — M6281 Muscle weakness (generalized): Secondary | ICD-10-CM | POA: Diagnosis not present

## 2018-02-03 DIAGNOSIS — I10 Essential (primary) hypertension: Secondary | ICD-10-CM | POA: Diagnosis not present

## 2018-02-03 DIAGNOSIS — R1312 Dysphagia, oropharyngeal phase: Secondary | ICD-10-CM | POA: Diagnosis not present

## 2018-02-03 DIAGNOSIS — W19XXXD Unspecified fall, subsequent encounter: Secondary | ICD-10-CM | POA: Diagnosis not present

## 2018-02-03 DIAGNOSIS — I482 Chronic atrial fibrillation: Secondary | ICD-10-CM | POA: Diagnosis not present

## 2018-02-04 DIAGNOSIS — G20 Parkinson's disease: Secondary | ICD-10-CM | POA: Diagnosis not present

## 2018-02-04 DIAGNOSIS — R488 Other symbolic dysfunctions: Secondary | ICD-10-CM | POA: Diagnosis not present

## 2018-02-04 DIAGNOSIS — M6281 Muscle weakness (generalized): Secondary | ICD-10-CM | POA: Diagnosis not present

## 2018-02-04 DIAGNOSIS — R2689 Other abnormalities of gait and mobility: Secondary | ICD-10-CM | POA: Diagnosis not present

## 2018-02-04 DIAGNOSIS — R1312 Dysphagia, oropharyngeal phase: Secondary | ICD-10-CM | POA: Diagnosis not present

## 2018-02-05 DIAGNOSIS — R1312 Dysphagia, oropharyngeal phase: Secondary | ICD-10-CM | POA: Diagnosis not present

## 2018-02-05 DIAGNOSIS — M6281 Muscle weakness (generalized): Secondary | ICD-10-CM | POA: Diagnosis not present

## 2018-02-05 DIAGNOSIS — R2689 Other abnormalities of gait and mobility: Secondary | ICD-10-CM | POA: Diagnosis not present

## 2018-02-05 DIAGNOSIS — G20 Parkinson's disease: Secondary | ICD-10-CM | POA: Diagnosis not present

## 2018-02-05 DIAGNOSIS — R488 Other symbolic dysfunctions: Secondary | ICD-10-CM | POA: Diagnosis not present

## 2018-02-08 DIAGNOSIS — R2689 Other abnormalities of gait and mobility: Secondary | ICD-10-CM | POA: Diagnosis not present

## 2018-02-08 DIAGNOSIS — G20 Parkinson's disease: Secondary | ICD-10-CM | POA: Diagnosis not present

## 2018-02-08 DIAGNOSIS — R1312 Dysphagia, oropharyngeal phase: Secondary | ICD-10-CM | POA: Diagnosis not present

## 2018-02-08 DIAGNOSIS — M6281 Muscle weakness (generalized): Secondary | ICD-10-CM | POA: Diagnosis not present

## 2018-02-08 DIAGNOSIS — R488 Other symbolic dysfunctions: Secondary | ICD-10-CM | POA: Diagnosis not present

## 2018-02-09 DIAGNOSIS — R488 Other symbolic dysfunctions: Secondary | ICD-10-CM | POA: Diagnosis not present

## 2018-02-09 DIAGNOSIS — G20 Parkinson's disease: Secondary | ICD-10-CM | POA: Diagnosis not present

## 2018-02-09 DIAGNOSIS — R1312 Dysphagia, oropharyngeal phase: Secondary | ICD-10-CM | POA: Diagnosis not present

## 2018-02-09 DIAGNOSIS — M6281 Muscle weakness (generalized): Secondary | ICD-10-CM | POA: Diagnosis not present

## 2018-02-09 DIAGNOSIS — R2689 Other abnormalities of gait and mobility: Secondary | ICD-10-CM | POA: Diagnosis not present

## 2018-02-10 DIAGNOSIS — R488 Other symbolic dysfunctions: Secondary | ICD-10-CM | POA: Diagnosis not present

## 2018-02-10 DIAGNOSIS — I482 Chronic atrial fibrillation: Secondary | ICD-10-CM | POA: Diagnosis not present

## 2018-02-10 DIAGNOSIS — R2689 Other abnormalities of gait and mobility: Secondary | ICD-10-CM | POA: Diagnosis not present

## 2018-02-10 DIAGNOSIS — R1312 Dysphagia, oropharyngeal phase: Secondary | ICD-10-CM | POA: Diagnosis not present

## 2018-02-10 DIAGNOSIS — M6281 Muscle weakness (generalized): Secondary | ICD-10-CM | POA: Diagnosis not present

## 2018-02-10 DIAGNOSIS — G20 Parkinson's disease: Secondary | ICD-10-CM | POA: Diagnosis not present

## 2018-02-10 DIAGNOSIS — I10 Essential (primary) hypertension: Secondary | ICD-10-CM | POA: Diagnosis not present

## 2018-02-10 DIAGNOSIS — M48061 Spinal stenosis, lumbar region without neurogenic claudication: Secondary | ICD-10-CM | POA: Diagnosis not present

## 2018-02-10 DIAGNOSIS — H04123 Dry eye syndrome of bilateral lacrimal glands: Secondary | ICD-10-CM | POA: Diagnosis not present

## 2018-02-11 DIAGNOSIS — R488 Other symbolic dysfunctions: Secondary | ICD-10-CM | POA: Diagnosis not present

## 2018-02-11 DIAGNOSIS — G20 Parkinson's disease: Secondary | ICD-10-CM | POA: Diagnosis not present

## 2018-02-11 DIAGNOSIS — R2689 Other abnormalities of gait and mobility: Secondary | ICD-10-CM | POA: Diagnosis not present

## 2018-02-11 DIAGNOSIS — M6281 Muscle weakness (generalized): Secondary | ICD-10-CM | POA: Diagnosis not present

## 2018-02-11 DIAGNOSIS — R1312 Dysphagia, oropharyngeal phase: Secondary | ICD-10-CM | POA: Diagnosis not present

## 2018-02-12 DIAGNOSIS — G20 Parkinson's disease: Secondary | ICD-10-CM | POA: Diagnosis not present

## 2018-02-12 DIAGNOSIS — R488 Other symbolic dysfunctions: Secondary | ICD-10-CM | POA: Diagnosis not present

## 2018-02-12 DIAGNOSIS — R2689 Other abnormalities of gait and mobility: Secondary | ICD-10-CM | POA: Diagnosis not present

## 2018-02-12 DIAGNOSIS — M6281 Muscle weakness (generalized): Secondary | ICD-10-CM | POA: Diagnosis not present

## 2018-02-12 DIAGNOSIS — R1312 Dysphagia, oropharyngeal phase: Secondary | ICD-10-CM | POA: Diagnosis not present

## 2018-02-15 DIAGNOSIS — R1312 Dysphagia, oropharyngeal phase: Secondary | ICD-10-CM | POA: Diagnosis not present

## 2018-02-15 DIAGNOSIS — R488 Other symbolic dysfunctions: Secondary | ICD-10-CM | POA: Diagnosis not present

## 2018-02-15 DIAGNOSIS — R2689 Other abnormalities of gait and mobility: Secondary | ICD-10-CM | POA: Diagnosis not present

## 2018-02-15 DIAGNOSIS — G20 Parkinson's disease: Secondary | ICD-10-CM | POA: Diagnosis not present

## 2018-02-15 DIAGNOSIS — R2231 Localized swelling, mass and lump, right upper limb: Secondary | ICD-10-CM | POA: Diagnosis not present

## 2018-02-15 DIAGNOSIS — H04123 Dry eye syndrome of bilateral lacrimal glands: Secondary | ICD-10-CM | POA: Diagnosis not present

## 2018-02-15 DIAGNOSIS — M6281 Muscle weakness (generalized): Secondary | ICD-10-CM | POA: Diagnosis not present

## 2018-02-15 DIAGNOSIS — K5900 Constipation, unspecified: Secondary | ICD-10-CM | POA: Diagnosis not present

## 2018-02-16 DIAGNOSIS — R1312 Dysphagia, oropharyngeal phase: Secondary | ICD-10-CM | POA: Diagnosis not present

## 2018-02-16 DIAGNOSIS — G20 Parkinson's disease: Secondary | ICD-10-CM | POA: Diagnosis not present

## 2018-02-16 DIAGNOSIS — R2689 Other abnormalities of gait and mobility: Secondary | ICD-10-CM | POA: Diagnosis not present

## 2018-02-16 DIAGNOSIS — R488 Other symbolic dysfunctions: Secondary | ICD-10-CM | POA: Diagnosis not present

## 2018-02-16 DIAGNOSIS — M6281 Muscle weakness (generalized): Secondary | ICD-10-CM | POA: Diagnosis not present

## 2018-02-16 DIAGNOSIS — K0889 Other specified disorders of teeth and supporting structures: Secondary | ICD-10-CM | POA: Diagnosis not present

## 2018-02-17 DIAGNOSIS — R2689 Other abnormalities of gait and mobility: Secondary | ICD-10-CM | POA: Diagnosis not present

## 2018-02-17 DIAGNOSIS — M6281 Muscle weakness (generalized): Secondary | ICD-10-CM | POA: Diagnosis not present

## 2018-02-17 DIAGNOSIS — G20 Parkinson's disease: Secondary | ICD-10-CM | POA: Diagnosis not present

## 2018-02-17 DIAGNOSIS — R488 Other symbolic dysfunctions: Secondary | ICD-10-CM | POA: Diagnosis not present

## 2018-02-17 DIAGNOSIS — R1312 Dysphagia, oropharyngeal phase: Secondary | ICD-10-CM | POA: Diagnosis not present

## 2018-02-18 DIAGNOSIS — K0889 Other specified disorders of teeth and supporting structures: Secondary | ICD-10-CM | POA: Diagnosis not present

## 2018-02-18 DIAGNOSIS — R2231 Localized swelling, mass and lump, right upper limb: Secondary | ICD-10-CM | POA: Diagnosis not present

## 2018-02-18 DIAGNOSIS — R110 Nausea: Secondary | ICD-10-CM | POA: Diagnosis not present

## 2018-02-18 DIAGNOSIS — M6281 Muscle weakness (generalized): Secondary | ICD-10-CM | POA: Diagnosis not present

## 2018-02-18 DIAGNOSIS — K5900 Constipation, unspecified: Secondary | ICD-10-CM | POA: Diagnosis not present

## 2018-02-18 DIAGNOSIS — R488 Other symbolic dysfunctions: Secondary | ICD-10-CM | POA: Diagnosis not present

## 2018-02-18 DIAGNOSIS — R1312 Dysphagia, oropharyngeal phase: Secondary | ICD-10-CM | POA: Diagnosis not present

## 2018-02-18 DIAGNOSIS — G20 Parkinson's disease: Secondary | ICD-10-CM | POA: Diagnosis not present

## 2018-02-18 DIAGNOSIS — R2689 Other abnormalities of gait and mobility: Secondary | ICD-10-CM | POA: Diagnosis not present

## 2018-02-19 ENCOUNTER — Encounter: Payer: Self-pay | Admitting: Cardiology

## 2018-02-19 DIAGNOSIS — R488 Other symbolic dysfunctions: Secondary | ICD-10-CM | POA: Diagnosis not present

## 2018-02-19 DIAGNOSIS — R1312 Dysphagia, oropharyngeal phase: Secondary | ICD-10-CM | POA: Diagnosis not present

## 2018-02-19 DIAGNOSIS — G20 Parkinson's disease: Secondary | ICD-10-CM | POA: Diagnosis not present

## 2018-02-19 DIAGNOSIS — M6281 Muscle weakness (generalized): Secondary | ICD-10-CM | POA: Diagnosis not present

## 2018-02-19 DIAGNOSIS — R2689 Other abnormalities of gait and mobility: Secondary | ICD-10-CM | POA: Diagnosis not present

## 2018-02-22 DIAGNOSIS — R488 Other symbolic dysfunctions: Secondary | ICD-10-CM | POA: Diagnosis not present

## 2018-02-22 DIAGNOSIS — G4700 Insomnia, unspecified: Secondary | ICD-10-CM | POA: Diagnosis not present

## 2018-02-22 DIAGNOSIS — F0391 Unspecified dementia with behavioral disturbance: Secondary | ICD-10-CM | POA: Diagnosis not present

## 2018-02-22 DIAGNOSIS — R2689 Other abnormalities of gait and mobility: Secondary | ICD-10-CM | POA: Diagnosis not present

## 2018-02-22 DIAGNOSIS — G20 Parkinson's disease: Secondary | ICD-10-CM | POA: Diagnosis not present

## 2018-02-22 DIAGNOSIS — R1312 Dysphagia, oropharyngeal phase: Secondary | ICD-10-CM | POA: Diagnosis not present

## 2018-02-22 DIAGNOSIS — M6281 Muscle weakness (generalized): Secondary | ICD-10-CM | POA: Diagnosis not present

## 2018-02-23 DIAGNOSIS — G20 Parkinson's disease: Secondary | ICD-10-CM | POA: Diagnosis not present

## 2018-02-23 DIAGNOSIS — R488 Other symbolic dysfunctions: Secondary | ICD-10-CM | POA: Diagnosis not present

## 2018-02-23 DIAGNOSIS — R1312 Dysphagia, oropharyngeal phase: Secondary | ICD-10-CM | POA: Diagnosis not present

## 2018-02-23 DIAGNOSIS — R2689 Other abnormalities of gait and mobility: Secondary | ICD-10-CM | POA: Diagnosis not present

## 2018-02-23 DIAGNOSIS — M6281 Muscle weakness (generalized): Secondary | ICD-10-CM | POA: Diagnosis not present

## 2018-02-24 DIAGNOSIS — R1312 Dysphagia, oropharyngeal phase: Secondary | ICD-10-CM | POA: Diagnosis not present

## 2018-02-24 DIAGNOSIS — R488 Other symbolic dysfunctions: Secondary | ICD-10-CM | POA: Diagnosis not present

## 2018-02-24 DIAGNOSIS — G20 Parkinson's disease: Secondary | ICD-10-CM | POA: Diagnosis not present

## 2018-02-24 DIAGNOSIS — R110 Nausea: Secondary | ICD-10-CM | POA: Diagnosis not present

## 2018-02-24 DIAGNOSIS — R2689 Other abnormalities of gait and mobility: Secondary | ICD-10-CM | POA: Diagnosis not present

## 2018-02-24 DIAGNOSIS — K5900 Constipation, unspecified: Secondary | ICD-10-CM | POA: Diagnosis not present

## 2018-02-24 DIAGNOSIS — M6281 Muscle weakness (generalized): Secondary | ICD-10-CM | POA: Diagnosis not present

## 2018-02-25 DIAGNOSIS — R2689 Other abnormalities of gait and mobility: Secondary | ICD-10-CM | POA: Diagnosis not present

## 2018-02-25 DIAGNOSIS — M6281 Muscle weakness (generalized): Secondary | ICD-10-CM | POA: Diagnosis not present

## 2018-02-26 DIAGNOSIS — M6281 Muscle weakness (generalized): Secondary | ICD-10-CM | POA: Diagnosis not present

## 2018-02-26 DIAGNOSIS — F332 Major depressive disorder, recurrent severe without psychotic features: Secondary | ICD-10-CM | POA: Diagnosis not present

## 2018-02-26 DIAGNOSIS — R2689 Other abnormalities of gait and mobility: Secondary | ICD-10-CM | POA: Diagnosis not present

## 2018-03-01 DIAGNOSIS — R2689 Other abnormalities of gait and mobility: Secondary | ICD-10-CM | POA: Diagnosis not present

## 2018-03-01 DIAGNOSIS — M6281 Muscle weakness (generalized): Secondary | ICD-10-CM | POA: Diagnosis not present

## 2018-03-02 DIAGNOSIS — M6281 Muscle weakness (generalized): Secondary | ICD-10-CM | POA: Diagnosis not present

## 2018-03-02 DIAGNOSIS — R2689 Other abnormalities of gait and mobility: Secondary | ICD-10-CM | POA: Diagnosis not present

## 2018-03-03 DIAGNOSIS — R2689 Other abnormalities of gait and mobility: Secondary | ICD-10-CM | POA: Diagnosis not present

## 2018-03-03 DIAGNOSIS — M6281 Muscle weakness (generalized): Secondary | ICD-10-CM | POA: Diagnosis not present

## 2018-03-04 DIAGNOSIS — R2689 Other abnormalities of gait and mobility: Secondary | ICD-10-CM | POA: Diagnosis not present

## 2018-03-04 DIAGNOSIS — F0391 Unspecified dementia with behavioral disturbance: Secondary | ICD-10-CM | POA: Diagnosis not present

## 2018-03-04 DIAGNOSIS — G4700 Insomnia, unspecified: Secondary | ICD-10-CM | POA: Diagnosis not present

## 2018-03-04 DIAGNOSIS — M6281 Muscle weakness (generalized): Secondary | ICD-10-CM | POA: Diagnosis not present

## 2018-03-04 DIAGNOSIS — K5900 Constipation, unspecified: Secondary | ICD-10-CM | POA: Diagnosis not present

## 2018-03-04 DIAGNOSIS — K0889 Other specified disorders of teeth and supporting structures: Secondary | ICD-10-CM | POA: Diagnosis not present

## 2018-03-04 DIAGNOSIS — R110 Nausea: Secondary | ICD-10-CM | POA: Diagnosis not present

## 2018-03-05 DIAGNOSIS — M6281 Muscle weakness (generalized): Secondary | ICD-10-CM | POA: Diagnosis not present

## 2018-03-05 DIAGNOSIS — R2689 Other abnormalities of gait and mobility: Secondary | ICD-10-CM | POA: Diagnosis not present

## 2018-03-08 DIAGNOSIS — Z79899 Other long term (current) drug therapy: Secondary | ICD-10-CM | POA: Diagnosis not present

## 2018-03-08 DIAGNOSIS — R2689 Other abnormalities of gait and mobility: Secondary | ICD-10-CM | POA: Diagnosis not present

## 2018-03-08 DIAGNOSIS — M6281 Muscle weakness (generalized): Secondary | ICD-10-CM | POA: Diagnosis not present

## 2018-03-09 DIAGNOSIS — R2689 Other abnormalities of gait and mobility: Secondary | ICD-10-CM | POA: Diagnosis not present

## 2018-03-09 DIAGNOSIS — M6281 Muscle weakness (generalized): Secondary | ICD-10-CM | POA: Diagnosis not present

## 2018-03-12 DIAGNOSIS — F419 Anxiety disorder, unspecified: Secondary | ICD-10-CM | POA: Diagnosis not present

## 2018-03-25 DIAGNOSIS — H04123 Dry eye syndrome of bilateral lacrimal glands: Secondary | ICD-10-CM | POA: Diagnosis not present

## 2018-03-25 DIAGNOSIS — H0101A Ulcerative blepharitis right eye, upper and lower eyelids: Secondary | ICD-10-CM | POA: Diagnosis not present

## 2018-03-25 DIAGNOSIS — H353131 Nonexudative age-related macular degeneration, bilateral, early dry stage: Secondary | ICD-10-CM | POA: Diagnosis not present

## 2018-03-25 DIAGNOSIS — H0101B Ulcerative blepharitis left eye, upper and lower eyelids: Secondary | ICD-10-CM | POA: Diagnosis not present

## 2018-03-29 ENCOUNTER — Emergency Department (HOSPITAL_COMMUNITY)
Admission: EM | Admit: 2018-03-29 | Discharge: 2018-03-30 | Payer: Medicare Other | Attending: Emergency Medicine | Admitting: Emergency Medicine

## 2018-03-29 ENCOUNTER — Encounter (HOSPITAL_COMMUNITY): Payer: Self-pay | Admitting: Emergency Medicine

## 2018-03-29 ENCOUNTER — Emergency Department (HOSPITAL_COMMUNITY): Payer: Medicare Other

## 2018-03-29 DIAGNOSIS — G20 Parkinson's disease: Secondary | ICD-10-CM | POA: Insufficient documentation

## 2018-03-29 DIAGNOSIS — R778 Other specified abnormalities of plasma proteins: Secondary | ICD-10-CM

## 2018-03-29 DIAGNOSIS — E860 Dehydration: Secondary | ICD-10-CM | POA: Insufficient documentation

## 2018-03-29 DIAGNOSIS — I110 Hypertensive heart disease with heart failure: Secondary | ICD-10-CM | POA: Insufficient documentation

## 2018-03-29 DIAGNOSIS — S2690XA Unspecified injury of heart, unspecified with or without hemopericardium, initial encounter: Secondary | ICD-10-CM | POA: Insufficient documentation

## 2018-03-29 DIAGNOSIS — I509 Heart failure, unspecified: Secondary | ICD-10-CM | POA: Diagnosis not present

## 2018-03-29 DIAGNOSIS — I959 Hypotension, unspecified: Secondary | ICD-10-CM | POA: Diagnosis not present

## 2018-03-29 DIAGNOSIS — Y939 Activity, unspecified: Secondary | ICD-10-CM | POA: Insufficient documentation

## 2018-03-29 DIAGNOSIS — Y999 Unspecified external cause status: Secondary | ICD-10-CM | POA: Diagnosis not present

## 2018-03-29 DIAGNOSIS — Y929 Unspecified place or not applicable: Secondary | ICD-10-CM | POA: Diagnosis not present

## 2018-03-29 DIAGNOSIS — R7989 Other specified abnormal findings of blood chemistry: Secondary | ICD-10-CM

## 2018-03-29 DIAGNOSIS — Z7902 Long term (current) use of antithrombotics/antiplatelets: Secondary | ICD-10-CM | POA: Insufficient documentation

## 2018-03-29 DIAGNOSIS — R748 Abnormal levels of other serum enzymes: Secondary | ICD-10-CM | POA: Insufficient documentation

## 2018-03-29 DIAGNOSIS — X58XXXA Exposure to other specified factors, initial encounter: Secondary | ICD-10-CM | POA: Insufficient documentation

## 2018-03-29 DIAGNOSIS — N390 Urinary tract infection, site not specified: Secondary | ICD-10-CM | POA: Diagnosis not present

## 2018-03-29 DIAGNOSIS — IMO0001 Reserved for inherently not codable concepts without codable children: Secondary | ICD-10-CM

## 2018-03-29 DIAGNOSIS — Z79899 Other long term (current) drug therapy: Secondary | ICD-10-CM | POA: Diagnosis not present

## 2018-03-29 DIAGNOSIS — R531 Weakness: Secondary | ICD-10-CM | POA: Diagnosis not present

## 2018-03-29 DIAGNOSIS — Z87891 Personal history of nicotine dependence: Secondary | ICD-10-CM | POA: Diagnosis not present

## 2018-03-29 DIAGNOSIS — R0600 Dyspnea, unspecified: Secondary | ICD-10-CM | POA: Diagnosis not present

## 2018-03-29 DIAGNOSIS — F0390 Unspecified dementia without behavioral disturbance: Secondary | ICD-10-CM | POA: Diagnosis not present

## 2018-03-29 DIAGNOSIS — Z950 Presence of cardiac pacemaker: Secondary | ICD-10-CM | POA: Diagnosis not present

## 2018-03-29 HISTORY — DX: Spinal stenosis, site unspecified: M48.00

## 2018-03-29 HISTORY — DX: Unspecified dementia, unspecified severity, without behavioral disturbance, psychotic disturbance, mood disturbance, and anxiety: F03.90

## 2018-03-29 HISTORY — DX: Dysphagia, unspecified: R13.10

## 2018-03-29 HISTORY — DX: Hyperlipidemia, unspecified: E78.5

## 2018-03-29 HISTORY — DX: Benign prostatic hyperplasia without lower urinary tract symptoms: N40.0

## 2018-03-29 HISTORY — DX: Disorder of thyroid, unspecified: E07.9

## 2018-03-29 MED ORDER — SODIUM CHLORIDE 0.9 % IV BOLUS
500.0000 mL | Freq: Once | INTRAVENOUS | Status: AC
  Administered 2018-03-30: 500 mL via INTRAVENOUS

## 2018-03-29 NOTE — ED Triage Notes (Signed)
Pt transported from [facility] for ?UTI, generalized weakness.  Per EMS strong urine odor

## 2018-03-29 NOTE — ED Provider Notes (Addendum)
[facility] EMERGENCY DEPARTMENT Provider Note   CSN: [id number] Arrival date & time: 03/29/18  2242     History   Chief Complaint Chief Complaint  Patient presents with  . Urinary Tract Infection    HPI [name] is a 84 y.o. male.  HPI  84 year old male comes in with chief complaint of weakness. Patient has history of A. fib, CHF, dementia, Parkinson's disorder and resides at a skilled nursing facility.  Patient reports that over the past few days he has had increasing weakness.  Patient is noted that he has been sleeping all day and does not have energy to get around.  He denies any recent falls.  Review of system is negative for any chest pain, cough, shortness of breath, abdominal pain, vomiting, rash, burning with urination.  Patient does feel like he has had increased urgency to go to the bathroom and he has been feeling cold w/o chills or fevers.   Past Medical History:  Diagnosis Date  . Arthritis    incl spinal stenosis  . Atrial fibrillation ([place])   . BPH (benign prostatic hyperplasia)   . CHF (congestive heart failure) ([place])   . CHF (congestive heart failure) ([place])   . Dementia   . Dysphagia   . HTN (hypertension)   . Hyperlipidemia   . Parkinson disease ([place])   . Renal insufficiency    question degree  . Spinal stenosis   . Thyroid disease   . Wears glasses    reading  . Wears partial dentures    upper partial    Patient Active Problem List   Diagnosis Date Noted  . CHF (congestive heart failure) ([place])   . Hypertension 09/17/2012  . Pneumonia 12/03/2011  . Syncope 10/13/2011  . ATRIAL FIBRILLATION 02/19/2010  . RENAL INSUFFICIENCY 02/19/2010  . PACEMAKER, single [employer] 02/19/2010    Past Surgical History:  Procedure Laterality Date  . bilateral shoulder surgery    . EYE SURGERY     both catararcts  . I&D EXTREMITY  12/07/2011   Procedure: IRRIGATION AND DEBRIDEMENT EXTREMITY;  Surgeon: Tennis Must, MD;  Location: [facility] ORS;  Service: Orthopedics;  Laterality: Left;  . PACEMAKER GENERATOR CHANGE N/A 11/28/2013   Procedure: PACEMAKER GENERATOR CHANGE;  Surgeon: Deboraha Sprang, MD;  Location: [facility] CATH LAB;  Service: Cardiovascular;  Laterality: N/A;  . PACEMAKER INSERTION     BSX pacemaker generator change 11-28-2013 by Dr Caryl Comes  . prosthetic hip - bilaterally    . R knee quadricep tendon repair    . TONSILLECTOMY    . TRIGGER FINGER RELEASE Right 05/26/2013   Procedure: RIGHT SMALL TRIGGER RELEASE ;  Surgeon: Tennis Must, MD;  Location: [facility];  Service: Orthopedics;  Laterality: Right;        Home Medications    Prior to Admission medications   Medication Sig Start Date End Date Taking? Authorizing Provider  acetaminophen (TYLENOL) 500 MG tablet Take 1,000 mg by mouth 2 (two) times daily. scheduled    [provider]  amoxicillin (AMOXIL) 500 MG capsule Take 500 mg by mouth See admin instructions. Take 4 capsules (2000 mg) by mouth prior to dental appointment    [provider]  Ascorbic Acid (VITAMIN C) 1000 MG tablet Take 1,000 mg by mouth daily.    [provider]  b complex vitamins tablet Take 1 tablet by mouth daily.    [provider]  beta carotene w/minerals (OCUVITE) tablet  Take 1 tablet by mouth 2 (two) times daily.    [provider]  bisacodyl (DULCOLAX) 5 MG EC tablet Take 10 mg by mouth daily as needed for moderate constipation.    [provider]  CALCIUM CITRATE PO Take 1 tablet by mouth daily.     [provider]  carbidopa-levodopa (SINEMET IR) 25-100 MG tablet Take 0.5 tablets by mouth 3 (three) times daily. 12/09/16   Ward Givens, NP  carvedilol (COREG) 6.25 MG tablet Take 1 tablet (6.25 mg total) by mouth 2 (two) times daily with a meal. 07/01/17   Seiler, Amber K, NP  Cascara Sagrada 450 MG CAPS Take 450 mg by mouth daily as needed (constipation).    [provider]    cephALEXin (KEFLEX) 500 MG capsule Take 1 capsule (500 mg total) by mouth 2 (two) times daily. 03/30/18   Varney Biles, MD  Cholecalciferol (VITAMIN D-3) 5000 UNITS TABS Take 5,000 Units by mouth daily.    [provider]  cyclobenzaprine (FLEXERIL) 10 MG tablet Take 1 tablet (10 mg total) by mouth 2 (two) times daily as needed for muscle spasms. 06/18/14   Szekalski, Kaitlyn, [name]  docusate sodium (COLACE) 100 MG capsule Take 100 mg by mouth daily. 07/22/16   [provider]  finasteride (PROSCAR) 5 MG tablet Take 5 mg by mouth daily.     [provider]  fluticasone (VERAMYST) 27.5 MCG/SPRAY nasal spray Place 2 sprays into the nose daily.    [provider]  furosemide (LASIX) 20 MG tablet Take 20 mg by mouth daily.    [provider]  HYDROcodone-acetaminophen (NORCO/VICODIN) 5-325 MG per tablet Take 1-2 tablets by mouth every 4 (four) hours as needed for moderate pain or severe pain. 06/18/14   [name], [name]  LISINOPRIL PO Take 1 tablet by mouth daily.     [provider]  Mouthwashes (BIOTENE DRY MOUTH MT) Use as directed 10 mLs in the mouth or throat daily as needed (dry mouth).    [provider]  Multiple Vitamin (MULITIVITAMIN WITH MINERALS) TABS Take 1 tablet by mouth daily.     [provider]  Omega-3 Fatty Acids (FISH OIL) 1200 MG CAPS Take 2,400 mg by mouth 2 (two) times daily.    [provider]  oxybutynin (DITROPAN-XL) 5 MG 24 hr tablet Take 5 mg by mouth at bedtime.    [provider]  polyvinyl alcohol (LIQUIFILM TEARS) 1.4 % ophthalmic solution Place 1 drop into both eyes 5 (five) times daily as needed for dry eyes.     [provider]  pravastatin (PRAVACHOL) 40 MG tablet Take 40 mg by mouth at bedtime.    [provider]  Rivaroxaban 15 & 20 MG TBPK Take 15-20 mg by mouth daily. 01/27/17   [provider]  sacubitril-valsartan (ENTRESTO) 24-26 MG Take  1 tablet by mouth 2 (two) times daily.    [provider]  Sodium Fluoride (FLUORIDEX DAILY DEFENSE DT) Place 1 application onto teeth daily.    [provider]  Tamsulosin HCl (FLOMAX) 0.4 MG CAPS Take 0.4 mg by mouth 2 (two) times daily.     [provider]  traMADol (ULTRAM) 50 MG tablet Take 100 mg by mouth 2 (two) times daily. scheduled    [provider]  zolpidem (AMBIEN) 10 MG tablet Take 10 mg by mouth at bedtime as needed for sleep.     [provider]  buPROPion (WELLBUTRIN) 100 MG tablet Take  200 mg by mouth daily.    10/13/11  [provider]    Family History No family history on file.  Social History Social History   Tobacco Use  . Smoking status: Former Smoker    Types: Cigarettes    Last attempt to quit: 05/13/1971    Years since quitting: 46.9  . Smokeless tobacco: Never Used  Substance Use Topics  . Alcohol use: No    Frequency: Never    Comment: occasional  . Drug use: No     Allergies   Amiodarone   Review of Systems Review of Systems  Constitutional: Positive for activity change, appetite change and fatigue. Negative for diaphoresis.  Respiratory: Negative for cough and shortness of breath.   Cardiovascular: Negative for chest pain.  Gastrointestinal: Negative for nausea and vomiting.  Genitourinary: Positive for urgency. Negative for difficulty urinating, dysuria, flank pain, frequency and hematuria.  Musculoskeletal: Negative for myalgias.  Skin: Negative for rash.  Allergic/Immunologic: Negative for immunocompromised state.  All other systems reviewed and are negative.    Physical Exam Updated Vital Signs BP (!) 149/59   Pulse (!) 58   Temp (!) 97.5 F (36.4 C) (Oral)   Resp 16   Wt 68 kg   SpO2 95%   BMI 22.81 kg/m   Physical Exam  Constitutional: He is oriented to person, place, and time. He appears well-developed.  HENT:  Head: Atraumatic.  Eyes: Pupils are equal, round, and  reactive to light. EOM are normal.  Neck: Neck supple.  No meningismus  Cardiovascular: Normal rate and regular rhythm.  Pulmonary/Chest: Effort normal. He has no wheezes.  Abdominal: Soft. There is no tenderness.  Musculoskeletal: He exhibits no tenderness.  Neurological: He is alert and oriented to person, place, and time.  Skin: Skin is warm.  Nursing note and vitals reviewed.    ED Treatments / Results  Labs (all labs ordered are listed, but only abnormal results are displayed) Labs Reviewed  URINE CULTURE - Abnormal; Notable for the following components:      Result Value   Culture >=100,000 COLONIES/mL ENTEROCOCCUS FAECALIS (*)    Organism ID, Bacteria ENTEROCOCCUS FAECALIS (*)    All other components within normal limits  BASIC METABOLIC PANEL - Abnormal; Notable for the following components:   Glucose, Bld 104 (*)    BUN 26 (*)    GFR calc non Af Amer 56 (*)    All other components within normal limits  CBC - Abnormal; Notable for the following components:   RBC 3.31 (*)    Hemoglobin 10.4 (*)    HCT 32.6 (*)    All other components within normal limits  URINALYSIS, ROUTINE W REFLEX MICROSCOPIC - Abnormal; Notable for the following components:   APPearance HAZY (*)    Leukocytes, UA MODERATE (*)    WBC, UA >50 (*)    Bacteria, UA RARE (*)    Non Squamous Epithelial 0-5 (*)    All other components within normal limits  TROPONIN I - Abnormal; Notable for the following components:   Troponin I 0.09 (*)    All other components within normal limits  TROPONIN I - Abnormal; Notable for the following components:   Troponin I 0.09 (*)    All other components within normal limits  I-STAT TROPONIN, ED - Abnormal; Notable for the following components:   Troponin i, poc 0.10 (*)    All other components within normal limits    EKG EKG Interpretation  Date/Time:  Monday March 29 2018 23:27:00 EDT Ventricular Rate:  62 PR Interval:    QRS Duration: 152 QT  Interval:  503 QTC Calculation: 511 R Axis:   83 Text Interpretation:  Sinus rhythm Short PR interval IVCD, consider atypical LBBB inferior and lateral TWI are not new ST elevation in anterior leads unchanged Confirmed by Varney Biles [phone]) on 03/29/2018 11:38:38 PM Also confirmed by Varney Biles [phone]), editor Hattie Perch ([phone])  on 03/30/2018 8:10:15 AM   Radiology No results found.  Procedures Procedures (including critical care time)  Medications Ordered in ED Medications  sodium chloride 0.9 % bolus 500 mL (0 mLs Intravenous Stopped 03/30/18 0256)  cefTRIAXone (ROCEPHIN) 1 g in sodium chloride 0.9 % 100 mL IVPB (0 g Intravenous Stopped 03/30/18 0400)     Initial Impression / Assessment and Plan / ED Course  I have reviewed the triage vital signs and the nursing notes.  Pertinent labs & imaging results that were available during my care of the patient were reviewed by me and considered in my medical decision making (see chart for details).  Clinical Course as of Apr 02 602  Tue Mar 30, 2018  0128 We will give ceftriaxone as UA confirms the suspicion for UTI.  No clinical concerns for any other intra-abdominal etiology at this time.  WBC, UA(!): >50 [AN]  0506 Repeat troponin ordered. Unlikely due to ischemia.  We will trend to see if there is significant change.  Troponin I(!!): 0.09 [AN]    Clinical Course User Index [AN] Varney Biles, MD    DDx: Sepsis syndrome ACS syndrome Infection - pneumonia/UTI/Cellulitis Dehydration Electrolyte abnormality Tox syndrome / poly-pharmacy Nonmotor parkinsonian symptomology  84 year old male brought into the ER with chief complaint of fatigue. Patient has been having generalized weakness over the past few days.  History is not suggestive of any specific source of infection, although patient does report urinary frequency.   Clinical concerns are high for UTI as the cause for the weakness and urinary urgency.  Were  not suspicious of prostatitis at this time.  If the UA is normal then dehydration and polypharmacy or other possibilities that we have considered.  Final Clinical Impressions(s) / ED Diagnoses   Final diagnoses:  Lower urinary tract infectious disease  Dehydration  Elevated troponin  Myocardial injury, initial encounter    ED Discharge Orders         Ordered    cephALEXin (KEFLEX) 500 MG capsule  2 times daily,   Status:  Discontinued     03/30/18 0622    cephALEXin (KEFLEX) 500 MG capsule  2 times daily     03/30/18 4696           Varney Biles, MD 03/30/18 2952    Varney Biles, MD 04/02/18 [phone]

## 2018-03-30 DIAGNOSIS — Z7401 Bed confinement status: Secondary | ICD-10-CM | POA: Diagnosis not present

## 2018-03-30 DIAGNOSIS — M2550 Pain in unspecified joint: Secondary | ICD-10-CM | POA: Diagnosis not present

## 2018-03-30 LAB — CBC
HCT: 32.6 % — ABNORMAL LOW (ref 39.0–52.0)
Hemoglobin: 10.4 g/dL — ABNORMAL LOW (ref 13.0–17.0)
MCH: 31.4 pg (ref 26.0–34.0)
MCHC: 31.9 g/dL (ref 30.0–36.0)
MCV: 98.5 fL (ref 78.0–100.0)
Platelets: 161 10*3/uL (ref 150–400)
RBC: 3.31 MIL/uL — ABNORMAL LOW (ref 4.22–5.81)
RDW: 14.5 % (ref 11.5–15.5)
WBC: 5.1 10*3/uL (ref 4.0–10.5)

## 2018-03-30 LAB — URINALYSIS, ROUTINE W REFLEX MICROSCOPIC
Bilirubin Urine: NEGATIVE
Glucose, UA: NEGATIVE mg/dL
Hgb urine dipstick: NEGATIVE
Ketones, ur: NEGATIVE mg/dL
Nitrite: NEGATIVE
Protein, ur: NEGATIVE mg/dL
Specific Gravity, Urine: 1.011 (ref 1.005–1.030)
WBC, UA: >50 WBC/hpf — ABNORMAL HIGH (ref 0–5)
pH: 6 (ref 5.0–8.0)

## 2018-03-30 LAB — BASIC METABOLIC PANEL
Anion gap: 10 (ref 5–15)
BUN: 26 mg/dL — ABNORMAL HIGH (ref 8–23)
CO2: 25 mmol/L (ref 22–32)
Calcium: 8.9 mg/dL (ref 8.9–10.3)
Chloride: 109 mmol/L (ref 98–111)
Creatinine, Ser: 1.13 mg/dL (ref 0.61–1.24)
GFR calc Af Amer: >60 mL/min (ref >60)
GFR calc non Af Amer: 56 mL/min — ABNORMAL LOW (ref >60)
Glucose, Bld: 104 mg/dL — ABNORMAL HIGH (ref 70–99)
Potassium: 3.5 mmol/L (ref 3.5–5.1)
Sodium: 144 mmol/L (ref 135–145)

## 2018-03-30 LAB — I-STAT TROPONIN, ED: Troponin i, poc: 0.1 ng/mL (ref 0.00–0.08)

## 2018-03-30 LAB — TROPONIN I: Troponin I: 0.09 ng/mL (ref <0.03)

## 2018-03-30 MED ORDER — CEPHALEXIN 500 MG PO CAPS: 500.0000 mg | ORAL_CAPSULE | Freq: Two times a day (BID) | ORAL | 0 refills | Status: DC

## 2018-03-30 MED ORDER — SODIUM CHLORIDE 0.9 % IV SOLN
1.0000 g | Freq: Once | INTRAVENOUS | Status: AC
  Administered 2018-03-30: 1 g via INTRAVENOUS
  Filled 2018-03-30: qty 10

## 2018-03-30 NOTE — ED Notes (Signed)
Dr Kathrynn Humble informed of troponin results .10

## 2018-03-30 NOTE — ED Notes (Addendum)
Report called to Hoyle Sauer at [facility], extended conversation regarding follow up and + trop in the ED confirmed with verbal confirmation [facility] called for transport

## 2018-03-30 NOTE — ED Notes (Signed)
Pt tolerated drinking water from straw with no difficulty.  In to draw blood from IV, unable to obtain blood from IV line, after unwrapping kling from forearm above and below IV site swelling noted. IV removed, warm packs applied.

## 2018-03-30 NOTE — Discharge Instructions (Addendum)
We saw Mr. [name] for chief complaint of weakness. He has been complaining of urinary frequency and his urine shows signs of infection.  We also noticed mild dehydration. It is likely possible that Mr. [name] has underlying infection and dehydration which has flared up his Parkinson's disorder -leading to the severe weakness.  Please have Mr. [name] follow-up with his primary care doctor or his neurologist in 1 week. Please ensure he gets the antibiotics prescribed and that he is appropriately hydrated.  Additionally, our work-up has revealed that Mr. [name] had slight troponin elevation of .09.  There is no new EKG findings associated with this.  Please have the primary care doctor also evaluate Mr. [name] on this matter.

## 2018-03-31 DIAGNOSIS — G20 Parkinson's disease: Secondary | ICD-10-CM | POA: Diagnosis not present

## 2018-03-31 DIAGNOSIS — E860 Dehydration: Secondary | ICD-10-CM | POA: Diagnosis not present

## 2018-03-31 DIAGNOSIS — N390 Urinary tract infection, site not specified: Secondary | ICD-10-CM | POA: Diagnosis not present

## 2018-03-31 DIAGNOSIS — R748 Abnormal levels of other serum enzymes: Secondary | ICD-10-CM | POA: Diagnosis not present

## 2018-03-31 LAB — TROPONIN I: Troponin I: 0.09 ng/mL (ref <0.03)

## 2018-04-01 LAB — URINE CULTURE: Culture: >=100000 — AB

## 2018-04-02 ENCOUNTER — Telehealth: Payer: Self-pay

## 2018-04-02 NOTE — Progress Notes (Signed)
ED Antimicrobial Stewardship Positive Culture Follow Up   [name] is an 84 y.o. male who presented to [facility] on 03/29/2018 with a chief complaint of  Chief Complaint  Patient presents with  . Urinary Tract Infection    Recent Results (from the past 720 hour(s))  Urine culture     Status: Abnormal   Collection Time: 03/30/18 12:54 AM  Result Value Ref Range Status   Specimen Description URINE, CLEAN CATCH  Final   Special Requests   Final    NONE Performed at [facility], [address] [place], [place] [address]    Culture >=100,000 COLONIES/mL ENTEROCOCCUS FAECALIS (A)  Final   Report Status 04/01/2018 FINAL  Final   Organism ID, Bacteria ENTEROCOCCUS FAECALIS (A)  Final      Susceptibility   Enterococcus faecalis - MIC*    AMPICILLIN <=2 SENSITIVE Sensitive     LEVOFLOXACIN 2 SENSITIVE Sensitive     NITROFURANTOIN <=16 SENSITIVE Sensitive     VANCOMYCIN 1 SENSITIVE Sensitive     * >=100,000 COLONIES/mL ENTEROCOCCUS FAECALIS    [x]  Treated with cephalexin, organism resistant to prescribed antimicrobial   New antibiotic prescription: Amoxicillin 500mg  BID x7 days  ED Provider: Darrol Jump Nimer 04/02/2018, 9:51 AM Clinical Pharmacist Monday - Friday phone -  [phone] Saturday - Sunday phone - [phone]

## 2018-04-02 NOTE — Telephone Encounter (Signed)
UC reprot with recommendations for change in Abx to Amoxicillin and stop Keflex, faxed to [facility]: [place]  Per Janeece Fitting Pac

## 2018-04-05 DIAGNOSIS — B351 Tinea unguium: Secondary | ICD-10-CM | POA: Diagnosis not present

## 2018-04-05 DIAGNOSIS — N401 Enlarged prostate with lower urinary tract symptoms: Secondary | ICD-10-CM | POA: Diagnosis not present

## 2018-04-05 DIAGNOSIS — I482 Chronic atrial fibrillation: Secondary | ICD-10-CM | POA: Diagnosis not present

## 2018-04-05 DIAGNOSIS — N390 Urinary tract infection, site not specified: Secondary | ICD-10-CM | POA: Diagnosis not present

## 2018-04-05 DIAGNOSIS — L84 Corns and callosities: Secondary | ICD-10-CM | POA: Diagnosis not present

## 2018-04-05 DIAGNOSIS — G20 Parkinson's disease: Secondary | ICD-10-CM | POA: Diagnosis not present

## 2018-04-05 DIAGNOSIS — I739 Peripheral vascular disease, unspecified: Secondary | ICD-10-CM | POA: Diagnosis not present

## 2018-04-08 DIAGNOSIS — I482 Chronic atrial fibrillation: Secondary | ICD-10-CM | POA: Diagnosis not present

## 2018-04-08 DIAGNOSIS — K5900 Constipation, unspecified: Secondary | ICD-10-CM | POA: Diagnosis not present

## 2018-04-08 DIAGNOSIS — I509 Heart failure, unspecified: Secondary | ICD-10-CM | POA: Diagnosis not present

## 2018-04-08 DIAGNOSIS — G20 Parkinson's disease: Secondary | ICD-10-CM | POA: Diagnosis not present

## 2018-04-15 DIAGNOSIS — G4700 Insomnia, unspecified: Secondary | ICD-10-CM | POA: Diagnosis not present

## 2018-04-15 DIAGNOSIS — F0391 Unspecified dementia with behavioral disturbance: Secondary | ICD-10-CM | POA: Diagnosis not present

## 2018-04-16 DIAGNOSIS — F332 Major depressive disorder, recurrent severe without psychotic features: Secondary | ICD-10-CM | POA: Diagnosis not present

## 2018-04-16 DIAGNOSIS — F0391 Unspecified dementia with behavioral disturbance: Secondary | ICD-10-CM | POA: Diagnosis not present

## 2018-04-16 DIAGNOSIS — G20 Parkinson's disease: Secondary | ICD-10-CM | POA: Diagnosis not present

## 2018-04-21 DIAGNOSIS — G20 Parkinson's disease: Secondary | ICD-10-CM | POA: Diagnosis not present

## 2018-04-21 DIAGNOSIS — M6281 Muscle weakness (generalized): Secondary | ICD-10-CM | POA: Diagnosis not present

## 2018-04-22 DIAGNOSIS — G20 Parkinson's disease: Secondary | ICD-10-CM | POA: Diagnosis not present

## 2018-04-22 DIAGNOSIS — M6281 Muscle weakness (generalized): Secondary | ICD-10-CM | POA: Diagnosis not present

## 2018-04-23 DIAGNOSIS — M6281 Muscle weakness (generalized): Secondary | ICD-10-CM | POA: Diagnosis not present

## 2018-04-23 DIAGNOSIS — G20 Parkinson's disease: Secondary | ICD-10-CM | POA: Diagnosis not present

## 2018-04-25 DIAGNOSIS — G20 Parkinson's disease: Secondary | ICD-10-CM | POA: Diagnosis not present

## 2018-04-25 DIAGNOSIS — M6281 Muscle weakness (generalized): Secondary | ICD-10-CM | POA: Diagnosis not present

## 2018-04-26 DIAGNOSIS — G20 Parkinson's disease: Secondary | ICD-10-CM | POA: Diagnosis not present

## 2018-04-26 DIAGNOSIS — M6281 Muscle weakness (generalized): Secondary | ICD-10-CM | POA: Diagnosis not present

## 2018-04-28 DIAGNOSIS — R2689 Other abnormalities of gait and mobility: Secondary | ICD-10-CM | POA: Diagnosis not present

## 2018-04-28 DIAGNOSIS — M6281 Muscle weakness (generalized): Secondary | ICD-10-CM | POA: Diagnosis not present

## 2018-04-28 DIAGNOSIS — R279 Unspecified lack of coordination: Secondary | ICD-10-CM | POA: Diagnosis not present

## 2018-04-29 DIAGNOSIS — M6281 Muscle weakness (generalized): Secondary | ICD-10-CM | POA: Diagnosis not present

## 2018-04-29 DIAGNOSIS — R2689 Other abnormalities of gait and mobility: Secondary | ICD-10-CM | POA: Diagnosis not present

## 2018-04-29 DIAGNOSIS — R279 Unspecified lack of coordination: Secondary | ICD-10-CM | POA: Diagnosis not present

## 2018-04-30 DIAGNOSIS — M6281 Muscle weakness (generalized): Secondary | ICD-10-CM | POA: Diagnosis not present

## 2018-04-30 DIAGNOSIS — R2689 Other abnormalities of gait and mobility: Secondary | ICD-10-CM | POA: Diagnosis not present

## 2018-04-30 DIAGNOSIS — R279 Unspecified lack of coordination: Secondary | ICD-10-CM | POA: Diagnosis not present

## 2018-05-03 DIAGNOSIS — M6281 Muscle weakness (generalized): Secondary | ICD-10-CM | POA: Diagnosis not present

## 2018-05-03 DIAGNOSIS — R279 Unspecified lack of coordination: Secondary | ICD-10-CM | POA: Diagnosis not present

## 2018-05-03 DIAGNOSIS — R2689 Other abnormalities of gait and mobility: Secondary | ICD-10-CM | POA: Diagnosis not present

## 2018-05-04 DIAGNOSIS — R279 Unspecified lack of coordination: Secondary | ICD-10-CM | POA: Diagnosis not present

## 2018-05-04 DIAGNOSIS — M6281 Muscle weakness (generalized): Secondary | ICD-10-CM | POA: Diagnosis not present

## 2018-05-04 DIAGNOSIS — R2689 Other abnormalities of gait and mobility: Secondary | ICD-10-CM | POA: Diagnosis not present

## 2018-05-05 DIAGNOSIS — M6281 Muscle weakness (generalized): Secondary | ICD-10-CM | POA: Diagnosis not present

## 2018-05-05 DIAGNOSIS — R279 Unspecified lack of coordination: Secondary | ICD-10-CM | POA: Diagnosis not present

## 2018-05-05 DIAGNOSIS — R2689 Other abnormalities of gait and mobility: Secondary | ICD-10-CM | POA: Diagnosis not present

## 2018-05-06 DIAGNOSIS — I509 Heart failure, unspecified: Secondary | ICD-10-CM | POA: Diagnosis not present

## 2018-05-06 DIAGNOSIS — R2689 Other abnormalities of gait and mobility: Secondary | ICD-10-CM | POA: Diagnosis not present

## 2018-05-06 DIAGNOSIS — I2510 Atherosclerotic heart disease of native coronary artery without angina pectoris: Secondary | ICD-10-CM | POA: Diagnosis not present

## 2018-05-06 DIAGNOSIS — R279 Unspecified lack of coordination: Secondary | ICD-10-CM | POA: Diagnosis not present

## 2018-05-06 DIAGNOSIS — G20 Parkinson's disease: Secondary | ICD-10-CM | POA: Diagnosis not present

## 2018-05-06 DIAGNOSIS — N401 Enlarged prostate with lower urinary tract symptoms: Secondary | ICD-10-CM | POA: Diagnosis not present

## 2018-05-06 DIAGNOSIS — M6281 Muscle weakness (generalized): Secondary | ICD-10-CM | POA: Diagnosis not present

## 2018-05-07 DIAGNOSIS — M6281 Muscle weakness (generalized): Secondary | ICD-10-CM | POA: Diagnosis not present

## 2018-05-07 DIAGNOSIS — R279 Unspecified lack of coordination: Secondary | ICD-10-CM | POA: Diagnosis not present

## 2018-05-07 DIAGNOSIS — R2689 Other abnormalities of gait and mobility: Secondary | ICD-10-CM | POA: Diagnosis not present

## 2018-05-08 DIAGNOSIS — Z23 Encounter for immunization: Secondary | ICD-10-CM | POA: Diagnosis not present

## 2018-05-10 DIAGNOSIS — R279 Unspecified lack of coordination: Secondary | ICD-10-CM | POA: Diagnosis not present

## 2018-05-10 DIAGNOSIS — M6281 Muscle weakness (generalized): Secondary | ICD-10-CM | POA: Diagnosis not present

## 2018-05-10 DIAGNOSIS — R2689 Other abnormalities of gait and mobility: Secondary | ICD-10-CM | POA: Diagnosis not present

## 2018-05-11 DIAGNOSIS — R2689 Other abnormalities of gait and mobility: Secondary | ICD-10-CM | POA: Diagnosis not present

## 2018-05-11 DIAGNOSIS — R279 Unspecified lack of coordination: Secondary | ICD-10-CM | POA: Diagnosis not present

## 2018-05-11 DIAGNOSIS — M6281 Muscle weakness (generalized): Secondary | ICD-10-CM | POA: Diagnosis not present

## 2018-05-12 DIAGNOSIS — M6281 Muscle weakness (generalized): Secondary | ICD-10-CM | POA: Diagnosis not present

## 2018-05-12 DIAGNOSIS — R279 Unspecified lack of coordination: Secondary | ICD-10-CM | POA: Diagnosis not present

## 2018-05-12 DIAGNOSIS — R2689 Other abnormalities of gait and mobility: Secondary | ICD-10-CM | POA: Diagnosis not present

## 2018-05-13 DIAGNOSIS — M6281 Muscle weakness (generalized): Secondary | ICD-10-CM | POA: Diagnosis not present

## 2018-05-13 DIAGNOSIS — R2689 Other abnormalities of gait and mobility: Secondary | ICD-10-CM | POA: Diagnosis not present

## 2018-05-13 DIAGNOSIS — R279 Unspecified lack of coordination: Secondary | ICD-10-CM | POA: Diagnosis not present

## 2018-05-14 DIAGNOSIS — M6281 Muscle weakness (generalized): Secondary | ICD-10-CM | POA: Diagnosis not present

## 2018-05-14 DIAGNOSIS — R2689 Other abnormalities of gait and mobility: Secondary | ICD-10-CM | POA: Diagnosis not present

## 2018-05-14 DIAGNOSIS — R279 Unspecified lack of coordination: Secondary | ICD-10-CM | POA: Diagnosis not present

## 2018-05-15 DIAGNOSIS — R2689 Other abnormalities of gait and mobility: Secondary | ICD-10-CM | POA: Diagnosis not present

## 2018-05-15 DIAGNOSIS — M6281 Muscle weakness (generalized): Secondary | ICD-10-CM | POA: Diagnosis not present

## 2018-05-15 DIAGNOSIS — R279 Unspecified lack of coordination: Secondary | ICD-10-CM | POA: Diagnosis not present

## 2018-05-17 DIAGNOSIS — R279 Unspecified lack of coordination: Secondary | ICD-10-CM | POA: Diagnosis not present

## 2018-05-17 DIAGNOSIS — R2689 Other abnormalities of gait and mobility: Secondary | ICD-10-CM | POA: Diagnosis not present

## 2018-05-17 DIAGNOSIS — M6281 Muscle weakness (generalized): Secondary | ICD-10-CM | POA: Diagnosis not present

## 2018-05-18 DIAGNOSIS — R2689 Other abnormalities of gait and mobility: Secondary | ICD-10-CM | POA: Diagnosis not present

## 2018-05-18 DIAGNOSIS — R279 Unspecified lack of coordination: Secondary | ICD-10-CM | POA: Diagnosis not present

## 2018-05-18 DIAGNOSIS — M6281 Muscle weakness (generalized): Secondary | ICD-10-CM | POA: Diagnosis not present

## 2018-05-19 ENCOUNTER — Encounter: Payer: Self-pay | Admitting: Cardiology

## 2018-05-19 DIAGNOSIS — M6281 Muscle weakness (generalized): Secondary | ICD-10-CM | POA: Diagnosis not present

## 2018-05-19 DIAGNOSIS — R279 Unspecified lack of coordination: Secondary | ICD-10-CM | POA: Diagnosis not present

## 2018-05-19 DIAGNOSIS — R2689 Other abnormalities of gait and mobility: Secondary | ICD-10-CM | POA: Diagnosis not present

## 2018-05-20 DIAGNOSIS — R279 Unspecified lack of coordination: Secondary | ICD-10-CM | POA: Diagnosis not present

## 2018-05-20 DIAGNOSIS — R2689 Other abnormalities of gait and mobility: Secondary | ICD-10-CM | POA: Diagnosis not present

## 2018-05-20 DIAGNOSIS — M6281 Muscle weakness (generalized): Secondary | ICD-10-CM | POA: Diagnosis not present

## 2018-05-21 DIAGNOSIS — R2689 Other abnormalities of gait and mobility: Secondary | ICD-10-CM | POA: Diagnosis not present

## 2018-05-21 DIAGNOSIS — R279 Unspecified lack of coordination: Secondary | ICD-10-CM | POA: Diagnosis not present

## 2018-05-21 DIAGNOSIS — M6281 Muscle weakness (generalized): Secondary | ICD-10-CM | POA: Diagnosis not present

## 2018-05-24 DIAGNOSIS — R2689 Other abnormalities of gait and mobility: Secondary | ICD-10-CM | POA: Diagnosis not present

## 2018-05-24 DIAGNOSIS — M6281 Muscle weakness (generalized): Secondary | ICD-10-CM | POA: Diagnosis not present

## 2018-05-24 DIAGNOSIS — R279 Unspecified lack of coordination: Secondary | ICD-10-CM | POA: Diagnosis not present

## 2018-05-25 DIAGNOSIS — R2689 Other abnormalities of gait and mobility: Secondary | ICD-10-CM | POA: Diagnosis not present

## 2018-05-25 DIAGNOSIS — M6281 Muscle weakness (generalized): Secondary | ICD-10-CM | POA: Diagnosis not present

## 2018-05-25 DIAGNOSIS — G4700 Insomnia, unspecified: Secondary | ICD-10-CM | POA: Diagnosis not present

## 2018-05-25 DIAGNOSIS — R279 Unspecified lack of coordination: Secondary | ICD-10-CM | POA: Diagnosis not present

## 2018-05-25 DIAGNOSIS — F0391 Unspecified dementia with behavioral disturbance: Secondary | ICD-10-CM | POA: Diagnosis not present

## 2018-05-26 DIAGNOSIS — R279 Unspecified lack of coordination: Secondary | ICD-10-CM | POA: Diagnosis not present

## 2018-05-26 DIAGNOSIS — M6281 Muscle weakness (generalized): Secondary | ICD-10-CM | POA: Diagnosis not present

## 2018-05-26 DIAGNOSIS — R2689 Other abnormalities of gait and mobility: Secondary | ICD-10-CM | POA: Diagnosis not present

## 2018-05-27 DIAGNOSIS — M6281 Muscle weakness (generalized): Secondary | ICD-10-CM | POA: Diagnosis not present

## 2018-05-27 DIAGNOSIS — R2689 Other abnormalities of gait and mobility: Secondary | ICD-10-CM | POA: Diagnosis not present

## 2018-05-27 DIAGNOSIS — R279 Unspecified lack of coordination: Secondary | ICD-10-CM | POA: Diagnosis not present

## 2018-05-28 DIAGNOSIS — R279 Unspecified lack of coordination: Secondary | ICD-10-CM | POA: Diagnosis not present

## 2018-05-28 DIAGNOSIS — M6281 Muscle weakness (generalized): Secondary | ICD-10-CM | POA: Diagnosis not present

## 2018-05-31 DIAGNOSIS — R110 Nausea: Secondary | ICD-10-CM | POA: Diagnosis not present

## 2018-05-31 DIAGNOSIS — K5900 Constipation, unspecified: Secondary | ICD-10-CM | POA: Diagnosis not present

## 2018-05-31 DIAGNOSIS — R279 Unspecified lack of coordination: Secondary | ICD-10-CM | POA: Diagnosis not present

## 2018-05-31 DIAGNOSIS — M6281 Muscle weakness (generalized): Secondary | ICD-10-CM | POA: Diagnosis not present

## 2018-05-31 DIAGNOSIS — F0391 Unspecified dementia with behavioral disturbance: Secondary | ICD-10-CM | POA: Diagnosis not present

## 2018-06-01 DIAGNOSIS — M6281 Muscle weakness (generalized): Secondary | ICD-10-CM | POA: Diagnosis not present

## 2018-06-01 DIAGNOSIS — R279 Unspecified lack of coordination: Secondary | ICD-10-CM | POA: Diagnosis not present

## 2018-06-01 DIAGNOSIS — K921 Melena: Secondary | ICD-10-CM | POA: Diagnosis not present

## 2018-06-01 DIAGNOSIS — N401 Enlarged prostate with lower urinary tract symptoms: Secondary | ICD-10-CM | POA: Diagnosis not present

## 2018-06-02 DIAGNOSIS — Z79899 Other long term (current) drug therapy: Secondary | ICD-10-CM | POA: Diagnosis not present

## 2018-06-02 DIAGNOSIS — M6281 Muscle weakness (generalized): Secondary | ICD-10-CM | POA: Diagnosis not present

## 2018-06-02 DIAGNOSIS — R279 Unspecified lack of coordination: Secondary | ICD-10-CM | POA: Diagnosis not present

## 2018-06-03 DIAGNOSIS — R279 Unspecified lack of coordination: Secondary | ICD-10-CM | POA: Diagnosis not present

## 2018-06-03 DIAGNOSIS — M6281 Muscle weakness (generalized): Secondary | ICD-10-CM | POA: Diagnosis not present

## 2018-06-04 DIAGNOSIS — F0391 Unspecified dementia with behavioral disturbance: Secondary | ICD-10-CM | POA: Diagnosis not present

## 2018-06-04 DIAGNOSIS — R110 Nausea: Secondary | ICD-10-CM | POA: Diagnosis not present

## 2018-06-04 DIAGNOSIS — K5900 Constipation, unspecified: Secondary | ICD-10-CM | POA: Diagnosis not present

## 2018-06-04 DIAGNOSIS — R05 Cough: Secondary | ICD-10-CM | POA: Diagnosis not present

## 2018-06-04 DIAGNOSIS — R279 Unspecified lack of coordination: Secondary | ICD-10-CM | POA: Diagnosis not present

## 2018-06-04 DIAGNOSIS — M6281 Muscle weakness (generalized): Secondary | ICD-10-CM | POA: Diagnosis not present

## 2018-06-07 DIAGNOSIS — M6281 Muscle weakness (generalized): Secondary | ICD-10-CM | POA: Diagnosis not present

## 2018-06-07 DIAGNOSIS — R279 Unspecified lack of coordination: Secondary | ICD-10-CM | POA: Diagnosis not present

## 2018-06-08 DIAGNOSIS — M6281 Muscle weakness (generalized): Secondary | ICD-10-CM | POA: Diagnosis not present

## 2018-06-08 DIAGNOSIS — R279 Unspecified lack of coordination: Secondary | ICD-10-CM | POA: Diagnosis not present

## 2018-06-09 DIAGNOSIS — M6281 Muscle weakness (generalized): Secondary | ICD-10-CM | POA: Diagnosis not present

## 2018-06-09 DIAGNOSIS — R279 Unspecified lack of coordination: Secondary | ICD-10-CM | POA: Diagnosis not present

## 2018-06-10 DIAGNOSIS — R279 Unspecified lack of coordination: Secondary | ICD-10-CM | POA: Diagnosis not present

## 2018-06-10 DIAGNOSIS — M6281 Muscle weakness (generalized): Secondary | ICD-10-CM | POA: Diagnosis not present

## 2018-06-11 DIAGNOSIS — R279 Unspecified lack of coordination: Secondary | ICD-10-CM | POA: Diagnosis not present

## 2018-06-11 DIAGNOSIS — M6281 Muscle weakness (generalized): Secondary | ICD-10-CM | POA: Diagnosis not present

## 2018-06-14 DIAGNOSIS — M48061 Spinal stenosis, lumbar region without neurogenic claudication: Secondary | ICD-10-CM | POA: Diagnosis not present

## 2018-06-14 DIAGNOSIS — Z993 Dependence on wheelchair: Secondary | ICD-10-CM | POA: Diagnosis not present

## 2018-06-14 DIAGNOSIS — R279 Unspecified lack of coordination: Secondary | ICD-10-CM | POA: Diagnosis not present

## 2018-06-14 DIAGNOSIS — M545 Low back pain: Secondary | ICD-10-CM | POA: Diagnosis not present

## 2018-06-14 DIAGNOSIS — M6281 Muscle weakness (generalized): Secondary | ICD-10-CM | POA: Diagnosis not present

## 2018-06-15 DIAGNOSIS — R279 Unspecified lack of coordination: Secondary | ICD-10-CM | POA: Diagnosis not present

## 2018-06-15 DIAGNOSIS — M6281 Muscle weakness (generalized): Secondary | ICD-10-CM | POA: Diagnosis not present

## 2018-06-16 DIAGNOSIS — R279 Unspecified lack of coordination: Secondary | ICD-10-CM | POA: Diagnosis not present

## 2018-06-16 DIAGNOSIS — M6281 Muscle weakness (generalized): Secondary | ICD-10-CM | POA: Diagnosis not present

## 2018-06-17 DIAGNOSIS — R279 Unspecified lack of coordination: Secondary | ICD-10-CM | POA: Diagnosis not present

## 2018-06-17 DIAGNOSIS — M6281 Muscle weakness (generalized): Secondary | ICD-10-CM | POA: Diagnosis not present

## 2018-06-18 DIAGNOSIS — M6281 Muscle weakness (generalized): Secondary | ICD-10-CM | POA: Diagnosis not present

## 2018-06-18 DIAGNOSIS — Z7901 Long term (current) use of anticoagulants: Secondary | ICD-10-CM | POA: Diagnosis not present

## 2018-06-18 DIAGNOSIS — R279 Unspecified lack of coordination: Secondary | ICD-10-CM | POA: Diagnosis not present

## 2018-06-18 DIAGNOSIS — R413 Other amnesia: Secondary | ICD-10-CM | POA: Diagnosis not present

## 2018-06-18 DIAGNOSIS — K1379 Other lesions of oral mucosa: Secondary | ICD-10-CM | POA: Diagnosis not present

## 2018-06-18 DIAGNOSIS — F0391 Unspecified dementia with behavioral disturbance: Secondary | ICD-10-CM | POA: Diagnosis not present

## 2018-06-21 DIAGNOSIS — R279 Unspecified lack of coordination: Secondary | ICD-10-CM | POA: Diagnosis not present

## 2018-06-21 DIAGNOSIS — M6281 Muscle weakness (generalized): Secondary | ICD-10-CM | POA: Diagnosis not present

## 2018-06-22 DIAGNOSIS — R279 Unspecified lack of coordination: Secondary | ICD-10-CM | POA: Diagnosis not present

## 2018-06-22 DIAGNOSIS — M6281 Muscle weakness (generalized): Secondary | ICD-10-CM | POA: Diagnosis not present

## 2018-06-23 DIAGNOSIS — M6281 Muscle weakness (generalized): Secondary | ICD-10-CM | POA: Diagnosis not present

## 2018-06-23 DIAGNOSIS — R279 Unspecified lack of coordination: Secondary | ICD-10-CM | POA: Diagnosis not present

## 2018-06-25 DIAGNOSIS — R279 Unspecified lack of coordination: Secondary | ICD-10-CM | POA: Diagnosis not present

## 2018-06-25 DIAGNOSIS — M6281 Muscle weakness (generalized): Secondary | ICD-10-CM | POA: Diagnosis not present

## 2018-06-26 DIAGNOSIS — M6281 Muscle weakness (generalized): Secondary | ICD-10-CM | POA: Diagnosis not present

## 2018-06-26 DIAGNOSIS — R279 Unspecified lack of coordination: Secondary | ICD-10-CM | POA: Diagnosis not present

## 2018-06-28 DIAGNOSIS — M6281 Muscle weakness (generalized): Secondary | ICD-10-CM | POA: Diagnosis not present

## 2018-06-28 DIAGNOSIS — R1312 Dysphagia, oropharyngeal phase: Secondary | ICD-10-CM | POA: Diagnosis not present

## 2018-06-28 DIAGNOSIS — R279 Unspecified lack of coordination: Secondary | ICD-10-CM | POA: Diagnosis not present

## 2018-06-29 DIAGNOSIS — R279 Unspecified lack of coordination: Secondary | ICD-10-CM | POA: Diagnosis not present

## 2018-06-29 DIAGNOSIS — M6281 Muscle weakness (generalized): Secondary | ICD-10-CM | POA: Diagnosis not present

## 2018-06-29 DIAGNOSIS — F0391 Unspecified dementia with behavioral disturbance: Secondary | ICD-10-CM | POA: Diagnosis not present

## 2018-06-29 DIAGNOSIS — R1312 Dysphagia, oropharyngeal phase: Secondary | ICD-10-CM | POA: Diagnosis not present

## 2018-06-29 DIAGNOSIS — G4700 Insomnia, unspecified: Secondary | ICD-10-CM | POA: Diagnosis not present

## 2018-06-29 DIAGNOSIS — G20 Parkinson's disease: Secondary | ICD-10-CM | POA: Diagnosis not present

## 2018-06-30 DIAGNOSIS — R1312 Dysphagia, oropharyngeal phase: Secondary | ICD-10-CM | POA: Diagnosis not present

## 2018-06-30 DIAGNOSIS — R279 Unspecified lack of coordination: Secondary | ICD-10-CM | POA: Diagnosis not present

## 2018-06-30 DIAGNOSIS — M6281 Muscle weakness (generalized): Secondary | ICD-10-CM | POA: Diagnosis not present

## 2018-07-01 DIAGNOSIS — R1312 Dysphagia, oropharyngeal phase: Secondary | ICD-10-CM | POA: Diagnosis not present

## 2018-07-01 DIAGNOSIS — M6281 Muscle weakness (generalized): Secondary | ICD-10-CM | POA: Diagnosis not present

## 2018-07-01 DIAGNOSIS — R279 Unspecified lack of coordination: Secondary | ICD-10-CM | POA: Diagnosis not present

## 2018-07-02 DIAGNOSIS — R1312 Dysphagia, oropharyngeal phase: Secondary | ICD-10-CM | POA: Diagnosis not present

## 2018-07-02 DIAGNOSIS — R279 Unspecified lack of coordination: Secondary | ICD-10-CM | POA: Diagnosis not present

## 2018-07-02 DIAGNOSIS — M6281 Muscle weakness (generalized): Secondary | ICD-10-CM | POA: Diagnosis not present

## 2018-07-05 DIAGNOSIS — R1312 Dysphagia, oropharyngeal phase: Secondary | ICD-10-CM | POA: Diagnosis not present

## 2018-07-05 DIAGNOSIS — M6281 Muscle weakness (generalized): Secondary | ICD-10-CM | POA: Diagnosis not present

## 2018-07-05 DIAGNOSIS — R279 Unspecified lack of coordination: Secondary | ICD-10-CM | POA: Diagnosis not present

## 2018-07-06 DIAGNOSIS — R279 Unspecified lack of coordination: Secondary | ICD-10-CM | POA: Diagnosis not present

## 2018-07-06 DIAGNOSIS — M6281 Muscle weakness (generalized): Secondary | ICD-10-CM | POA: Diagnosis not present

## 2018-07-06 DIAGNOSIS — R1312 Dysphagia, oropharyngeal phase: Secondary | ICD-10-CM | POA: Diagnosis not present

## 2018-07-07 DIAGNOSIS — R1312 Dysphagia, oropharyngeal phase: Secondary | ICD-10-CM | POA: Diagnosis not present

## 2018-07-07 DIAGNOSIS — R279 Unspecified lack of coordination: Secondary | ICD-10-CM | POA: Diagnosis not present

## 2018-07-07 DIAGNOSIS — M6281 Muscle weakness (generalized): Secondary | ICD-10-CM | POA: Diagnosis not present

## 2018-07-08 DIAGNOSIS — R1312 Dysphagia, oropharyngeal phase: Secondary | ICD-10-CM | POA: Diagnosis not present

## 2018-07-08 DIAGNOSIS — R279 Unspecified lack of coordination: Secondary | ICD-10-CM | POA: Diagnosis not present

## 2018-07-08 DIAGNOSIS — M6281 Muscle weakness (generalized): Secondary | ICD-10-CM | POA: Diagnosis not present

## 2018-07-09 DIAGNOSIS — R1312 Dysphagia, oropharyngeal phase: Secondary | ICD-10-CM | POA: Diagnosis not present

## 2018-07-09 DIAGNOSIS — R279 Unspecified lack of coordination: Secondary | ICD-10-CM | POA: Diagnosis not present

## 2018-07-09 DIAGNOSIS — M6281 Muscle weakness (generalized): Secondary | ICD-10-CM | POA: Diagnosis not present

## 2018-07-12 DIAGNOSIS — R1312 Dysphagia, oropharyngeal phase: Secondary | ICD-10-CM | POA: Diagnosis not present

## 2018-07-12 DIAGNOSIS — R279 Unspecified lack of coordination: Secondary | ICD-10-CM | POA: Diagnosis not present

## 2018-07-12 DIAGNOSIS — M6281 Muscle weakness (generalized): Secondary | ICD-10-CM | POA: Diagnosis not present

## 2018-07-13 DIAGNOSIS — R1312 Dysphagia, oropharyngeal phase: Secondary | ICD-10-CM | POA: Diagnosis not present

## 2018-07-13 DIAGNOSIS — M48061 Spinal stenosis, lumbar region without neurogenic claudication: Secondary | ICD-10-CM | POA: Diagnosis not present

## 2018-07-13 DIAGNOSIS — M6281 Muscle weakness (generalized): Secondary | ICD-10-CM | POA: Diagnosis not present

## 2018-07-13 DIAGNOSIS — R279 Unspecified lack of coordination: Secondary | ICD-10-CM | POA: Diagnosis not present

## 2018-07-13 DIAGNOSIS — F0391 Unspecified dementia with behavioral disturbance: Secondary | ICD-10-CM | POA: Diagnosis not present

## 2018-07-13 DIAGNOSIS — G20 Parkinson's disease: Secondary | ICD-10-CM | POA: Diagnosis not present

## 2018-07-13 DIAGNOSIS — I509 Heart failure, unspecified: Secondary | ICD-10-CM | POA: Diagnosis not present

## 2018-07-14 DIAGNOSIS — R279 Unspecified lack of coordination: Secondary | ICD-10-CM | POA: Diagnosis not present

## 2018-07-14 DIAGNOSIS — M6281 Muscle weakness (generalized): Secondary | ICD-10-CM | POA: Diagnosis not present

## 2018-07-14 DIAGNOSIS — R1312 Dysphagia, oropharyngeal phase: Secondary | ICD-10-CM | POA: Diagnosis not present

## 2018-07-15 DIAGNOSIS — M6281 Muscle weakness (generalized): Secondary | ICD-10-CM | POA: Diagnosis not present

## 2018-07-15 DIAGNOSIS — R279 Unspecified lack of coordination: Secondary | ICD-10-CM | POA: Diagnosis not present

## 2018-07-15 DIAGNOSIS — R1312 Dysphagia, oropharyngeal phase: Secondary | ICD-10-CM | POA: Diagnosis not present

## 2018-07-16 DIAGNOSIS — M6281 Muscle weakness (generalized): Secondary | ICD-10-CM | POA: Diagnosis not present

## 2018-07-16 DIAGNOSIS — R279 Unspecified lack of coordination: Secondary | ICD-10-CM | POA: Diagnosis not present

## 2018-07-16 DIAGNOSIS — R1312 Dysphagia, oropharyngeal phase: Secondary | ICD-10-CM | POA: Diagnosis not present

## 2018-07-18 DIAGNOSIS — M6281 Muscle weakness (generalized): Secondary | ICD-10-CM | POA: Diagnosis not present

## 2018-07-18 DIAGNOSIS — R279 Unspecified lack of coordination: Secondary | ICD-10-CM | POA: Diagnosis not present

## 2018-07-18 DIAGNOSIS — R1312 Dysphagia, oropharyngeal phase: Secondary | ICD-10-CM | POA: Diagnosis not present

## 2018-07-19 DIAGNOSIS — M6281 Muscle weakness (generalized): Secondary | ICD-10-CM | POA: Diagnosis not present

## 2018-07-19 DIAGNOSIS — R279 Unspecified lack of coordination: Secondary | ICD-10-CM | POA: Diagnosis not present

## 2018-07-19 DIAGNOSIS — R1312 Dysphagia, oropharyngeal phase: Secondary | ICD-10-CM | POA: Diagnosis not present

## 2018-07-20 DIAGNOSIS — M6281 Muscle weakness (generalized): Secondary | ICD-10-CM | POA: Diagnosis not present

## 2018-07-20 DIAGNOSIS — R279 Unspecified lack of coordination: Secondary | ICD-10-CM | POA: Diagnosis not present

## 2018-07-20 DIAGNOSIS — R1312 Dysphagia, oropharyngeal phase: Secondary | ICD-10-CM | POA: Diagnosis not present

## 2018-07-22 DIAGNOSIS — R279 Unspecified lack of coordination: Secondary | ICD-10-CM | POA: Diagnosis not present

## 2018-07-22 DIAGNOSIS — R1312 Dysphagia, oropharyngeal phase: Secondary | ICD-10-CM | POA: Diagnosis not present

## 2018-07-22 DIAGNOSIS — M6281 Muscle weakness (generalized): Secondary | ICD-10-CM | POA: Diagnosis not present

## 2018-07-23 DIAGNOSIS — R1312 Dysphagia, oropharyngeal phase: Secondary | ICD-10-CM | POA: Diagnosis not present

## 2018-07-23 DIAGNOSIS — R279 Unspecified lack of coordination: Secondary | ICD-10-CM | POA: Diagnosis not present

## 2018-07-23 DIAGNOSIS — M6281 Muscle weakness (generalized): Secondary | ICD-10-CM | POA: Diagnosis not present

## 2018-07-24 DIAGNOSIS — R1312 Dysphagia, oropharyngeal phase: Secondary | ICD-10-CM | POA: Diagnosis not present

## 2018-07-24 DIAGNOSIS — M6281 Muscle weakness (generalized): Secondary | ICD-10-CM | POA: Diagnosis not present

## 2018-07-24 DIAGNOSIS — R279 Unspecified lack of coordination: Secondary | ICD-10-CM | POA: Diagnosis not present

## 2018-07-25 DIAGNOSIS — R1312 Dysphagia, oropharyngeal phase: Secondary | ICD-10-CM | POA: Diagnosis not present

## 2018-07-25 DIAGNOSIS — R279 Unspecified lack of coordination: Secondary | ICD-10-CM | POA: Diagnosis not present

## 2018-07-25 DIAGNOSIS — M6281 Muscle weakness (generalized): Secondary | ICD-10-CM | POA: Diagnosis not present

## 2018-07-26 DIAGNOSIS — R279 Unspecified lack of coordination: Secondary | ICD-10-CM | POA: Diagnosis not present

## 2018-07-26 DIAGNOSIS — R1312 Dysphagia, oropharyngeal phase: Secondary | ICD-10-CM | POA: Diagnosis not present

## 2018-07-26 DIAGNOSIS — M6281 Muscle weakness (generalized): Secondary | ICD-10-CM | POA: Diagnosis not present

## 2018-07-27 DIAGNOSIS — K5900 Constipation, unspecified: Secondary | ICD-10-CM | POA: Diagnosis not present

## 2018-07-27 DIAGNOSIS — R279 Unspecified lack of coordination: Secondary | ICD-10-CM | POA: Diagnosis not present

## 2018-07-27 DIAGNOSIS — R110 Nausea: Secondary | ICD-10-CM | POA: Diagnosis not present

## 2018-07-27 DIAGNOSIS — M6281 Muscle weakness (generalized): Secondary | ICD-10-CM | POA: Diagnosis not present

## 2018-07-27 DIAGNOSIS — F0391 Unspecified dementia with behavioral disturbance: Secondary | ICD-10-CM | POA: Diagnosis not present

## 2018-07-27 DIAGNOSIS — R1312 Dysphagia, oropharyngeal phase: Secondary | ICD-10-CM | POA: Diagnosis not present

## 2018-07-28 DIAGNOSIS — R279 Unspecified lack of coordination: Secondary | ICD-10-CM | POA: Diagnosis not present

## 2018-07-28 DIAGNOSIS — R488 Other symbolic dysfunctions: Secondary | ICD-10-CM | POA: Diagnosis not present

## 2018-07-28 DIAGNOSIS — R1312 Dysphagia, oropharyngeal phase: Secondary | ICD-10-CM | POA: Diagnosis not present

## 2018-07-28 DIAGNOSIS — M6281 Muscle weakness (generalized): Secondary | ICD-10-CM | POA: Diagnosis not present

## 2018-07-28 DIAGNOSIS — I2510 Atherosclerotic heart disease of native coronary artery without angina pectoris: Secondary | ICD-10-CM | POA: Diagnosis not present

## 2018-07-29 DIAGNOSIS — M6281 Muscle weakness (generalized): Secondary | ICD-10-CM | POA: Diagnosis not present

## 2018-07-29 DIAGNOSIS — I2510 Atherosclerotic heart disease of native coronary artery without angina pectoris: Secondary | ICD-10-CM | POA: Diagnosis not present

## 2018-07-29 DIAGNOSIS — R279 Unspecified lack of coordination: Secondary | ICD-10-CM | POA: Diagnosis not present

## 2018-07-29 DIAGNOSIS — R1312 Dysphagia, oropharyngeal phase: Secondary | ICD-10-CM | POA: Diagnosis not present

## 2018-07-29 DIAGNOSIS — R488 Other symbolic dysfunctions: Secondary | ICD-10-CM | POA: Diagnosis not present

## 2018-07-30 DIAGNOSIS — R488 Other symbolic dysfunctions: Secondary | ICD-10-CM | POA: Diagnosis not present

## 2018-07-30 DIAGNOSIS — R279 Unspecified lack of coordination: Secondary | ICD-10-CM | POA: Diagnosis not present

## 2018-07-30 DIAGNOSIS — R1312 Dysphagia, oropharyngeal phase: Secondary | ICD-10-CM | POA: Diagnosis not present

## 2018-07-30 DIAGNOSIS — M6281 Muscle weakness (generalized): Secondary | ICD-10-CM | POA: Diagnosis not present

## 2018-07-30 DIAGNOSIS — I2510 Atherosclerotic heart disease of native coronary artery without angina pectoris: Secondary | ICD-10-CM | POA: Diagnosis not present

## 2018-08-02 DIAGNOSIS — I2510 Atherosclerotic heart disease of native coronary artery without angina pectoris: Secondary | ICD-10-CM | POA: Diagnosis not present

## 2018-08-02 DIAGNOSIS — R488 Other symbolic dysfunctions: Secondary | ICD-10-CM | POA: Diagnosis not present

## 2018-08-02 DIAGNOSIS — R1312 Dysphagia, oropharyngeal phase: Secondary | ICD-10-CM | POA: Diagnosis not present

## 2018-08-02 DIAGNOSIS — R279 Unspecified lack of coordination: Secondary | ICD-10-CM | POA: Diagnosis not present

## 2018-08-02 DIAGNOSIS — M6281 Muscle weakness (generalized): Secondary | ICD-10-CM | POA: Diagnosis not present

## 2018-08-03 DIAGNOSIS — R279 Unspecified lack of coordination: Secondary | ICD-10-CM | POA: Diagnosis not present

## 2018-08-03 DIAGNOSIS — G4700 Insomnia, unspecified: Secondary | ICD-10-CM | POA: Diagnosis not present

## 2018-08-03 DIAGNOSIS — M6281 Muscle weakness (generalized): Secondary | ICD-10-CM | POA: Diagnosis not present

## 2018-08-03 DIAGNOSIS — R1312 Dysphagia, oropharyngeal phase: Secondary | ICD-10-CM | POA: Diagnosis not present

## 2018-08-03 DIAGNOSIS — R488 Other symbolic dysfunctions: Secondary | ICD-10-CM | POA: Diagnosis not present

## 2018-08-03 DIAGNOSIS — I2510 Atherosclerotic heart disease of native coronary artery without angina pectoris: Secondary | ICD-10-CM | POA: Diagnosis not present

## 2018-08-04 DIAGNOSIS — R488 Other symbolic dysfunctions: Secondary | ICD-10-CM | POA: Diagnosis not present

## 2018-08-04 DIAGNOSIS — I2510 Atherosclerotic heart disease of native coronary artery without angina pectoris: Secondary | ICD-10-CM | POA: Diagnosis not present

## 2018-08-04 DIAGNOSIS — R1312 Dysphagia, oropharyngeal phase: Secondary | ICD-10-CM | POA: Diagnosis not present

## 2018-08-04 DIAGNOSIS — M6281 Muscle weakness (generalized): Secondary | ICD-10-CM | POA: Diagnosis not present

## 2018-08-04 DIAGNOSIS — R279 Unspecified lack of coordination: Secondary | ICD-10-CM | POA: Diagnosis not present

## 2018-08-05 DIAGNOSIS — R279 Unspecified lack of coordination: Secondary | ICD-10-CM | POA: Diagnosis not present

## 2018-08-05 DIAGNOSIS — R1312 Dysphagia, oropharyngeal phase: Secondary | ICD-10-CM | POA: Diagnosis not present

## 2018-08-05 DIAGNOSIS — R488 Other symbolic dysfunctions: Secondary | ICD-10-CM | POA: Diagnosis not present

## 2018-08-05 DIAGNOSIS — M6281 Muscle weakness (generalized): Secondary | ICD-10-CM | POA: Diagnosis not present

## 2018-08-05 DIAGNOSIS — I2510 Atherosclerotic heart disease of native coronary artery without angina pectoris: Secondary | ICD-10-CM | POA: Diagnosis not present

## 2018-08-06 DIAGNOSIS — R488 Other symbolic dysfunctions: Secondary | ICD-10-CM | POA: Diagnosis not present

## 2018-08-06 DIAGNOSIS — R279 Unspecified lack of coordination: Secondary | ICD-10-CM | POA: Diagnosis not present

## 2018-08-06 DIAGNOSIS — M6281 Muscle weakness (generalized): Secondary | ICD-10-CM | POA: Diagnosis not present

## 2018-08-06 DIAGNOSIS — R1312 Dysphagia, oropharyngeal phase: Secondary | ICD-10-CM | POA: Diagnosis not present

## 2018-08-06 DIAGNOSIS — I2510 Atherosclerotic heart disease of native coronary artery without angina pectoris: Secondary | ICD-10-CM | POA: Diagnosis not present

## 2018-08-09 DIAGNOSIS — M6281 Muscle weakness (generalized): Secondary | ICD-10-CM | POA: Diagnosis not present

## 2018-08-09 DIAGNOSIS — R1312 Dysphagia, oropharyngeal phase: Secondary | ICD-10-CM | POA: Diagnosis not present

## 2018-08-09 DIAGNOSIS — R279 Unspecified lack of coordination: Secondary | ICD-10-CM | POA: Diagnosis not present

## 2018-08-09 DIAGNOSIS — R488 Other symbolic dysfunctions: Secondary | ICD-10-CM | POA: Diagnosis not present

## 2018-08-09 DIAGNOSIS — I2510 Atherosclerotic heart disease of native coronary artery without angina pectoris: Secondary | ICD-10-CM | POA: Diagnosis not present

## 2018-08-10 DIAGNOSIS — R1312 Dysphagia, oropharyngeal phase: Secondary | ICD-10-CM | POA: Diagnosis not present

## 2018-08-10 DIAGNOSIS — L603 Nail dystrophy: Secondary | ICD-10-CM | POA: Diagnosis not present

## 2018-08-10 DIAGNOSIS — I2510 Atherosclerotic heart disease of native coronary artery without angina pectoris: Secondary | ICD-10-CM | POA: Diagnosis not present

## 2018-08-10 DIAGNOSIS — M6281 Muscle weakness (generalized): Secondary | ICD-10-CM | POA: Diagnosis not present

## 2018-08-10 DIAGNOSIS — B351 Tinea unguium: Secondary | ICD-10-CM | POA: Diagnosis not present

## 2018-08-10 DIAGNOSIS — I739 Peripheral vascular disease, unspecified: Secondary | ICD-10-CM | POA: Diagnosis not present

## 2018-08-10 DIAGNOSIS — R488 Other symbolic dysfunctions: Secondary | ICD-10-CM | POA: Diagnosis not present

## 2018-08-10 DIAGNOSIS — R279 Unspecified lack of coordination: Secondary | ICD-10-CM | POA: Diagnosis not present

## 2018-08-11 DIAGNOSIS — I2510 Atherosclerotic heart disease of native coronary artery without angina pectoris: Secondary | ICD-10-CM | POA: Diagnosis not present

## 2018-08-11 DIAGNOSIS — R488 Other symbolic dysfunctions: Secondary | ICD-10-CM | POA: Diagnosis not present

## 2018-08-11 DIAGNOSIS — R1312 Dysphagia, oropharyngeal phase: Secondary | ICD-10-CM | POA: Diagnosis not present

## 2018-08-11 DIAGNOSIS — M6281 Muscle weakness (generalized): Secondary | ICD-10-CM | POA: Diagnosis not present

## 2018-08-11 DIAGNOSIS — R279 Unspecified lack of coordination: Secondary | ICD-10-CM | POA: Diagnosis not present

## 2018-08-13 ENCOUNTER — Encounter: Payer: Medicare Other | Admitting: Internal Medicine

## 2018-09-03 DIAGNOSIS — J189 Pneumonia, unspecified organism: Secondary | ICD-10-CM | POA: Diagnosis not present

## 2018-09-03 DIAGNOSIS — R05 Cough: Secondary | ICD-10-CM | POA: Diagnosis not present

## 2018-09-03 DIAGNOSIS — R0989 Other specified symptoms and signs involving the circulatory and respiratory systems: Secondary | ICD-10-CM | POA: Diagnosis not present

## 2018-09-06 DIAGNOSIS — K5900 Constipation, unspecified: Secondary | ICD-10-CM | POA: Diagnosis not present

## 2018-09-06 DIAGNOSIS — J9801 Acute bronchospasm: Secondary | ICD-10-CM | POA: Diagnosis not present

## 2018-09-06 DIAGNOSIS — R05 Cough: Secondary | ICD-10-CM | POA: Diagnosis not present

## 2018-09-08 DIAGNOSIS — S4992XA Unspecified injury of left shoulder and upper arm, initial encounter: Secondary | ICD-10-CM | POA: Diagnosis not present

## 2018-09-08 DIAGNOSIS — S59912A Unspecified injury of left forearm, initial encounter: Secondary | ICD-10-CM | POA: Diagnosis not present

## 2018-09-08 DIAGNOSIS — S34109A Unspecified injury to unspecified level of lumbar spinal cord, initial encounter: Secondary | ICD-10-CM | POA: Diagnosis not present

## 2018-09-08 DIAGNOSIS — S59902A Unspecified injury of left elbow, initial encounter: Secondary | ICD-10-CM | POA: Diagnosis not present

## 2018-09-09 DIAGNOSIS — M545 Low back pain: Secondary | ICD-10-CM | POA: Diagnosis not present

## 2018-09-09 DIAGNOSIS — M79602 Pain in left arm: Secondary | ICD-10-CM | POA: Diagnosis not present

## 2018-09-09 DIAGNOSIS — G20 Parkinson's disease: Secondary | ICD-10-CM | POA: Diagnosis not present

## 2018-09-10 DIAGNOSIS — M6281 Muscle weakness (generalized): Secondary | ICD-10-CM | POA: Diagnosis not present

## 2018-09-10 DIAGNOSIS — M25519 Pain in unspecified shoulder: Secondary | ICD-10-CM | POA: Diagnosis not present

## 2018-09-10 DIAGNOSIS — R296 Repeated falls: Secondary | ICD-10-CM | POA: Diagnosis not present

## 2018-09-10 DIAGNOSIS — I2510 Atherosclerotic heart disease of native coronary artery without angina pectoris: Secondary | ICD-10-CM | POA: Diagnosis not present

## 2018-09-10 DIAGNOSIS — Z79899 Other long term (current) drug therapy: Secondary | ICD-10-CM | POA: Diagnosis not present

## 2018-09-11 DIAGNOSIS — I2510 Atherosclerotic heart disease of native coronary artery without angina pectoris: Secondary | ICD-10-CM | POA: Diagnosis not present

## 2018-09-11 DIAGNOSIS — R296 Repeated falls: Secondary | ICD-10-CM | POA: Diagnosis not present

## 2018-09-11 DIAGNOSIS — M25519 Pain in unspecified shoulder: Secondary | ICD-10-CM | POA: Diagnosis not present

## 2018-09-11 DIAGNOSIS — M6281 Muscle weakness (generalized): Secondary | ICD-10-CM | POA: Diagnosis not present

## 2018-09-13 ENCOUNTER — Encounter: Payer: Self-pay | Admitting: Internal Medicine

## 2018-09-13 ENCOUNTER — Encounter: Payer: Medicare Other | Admitting: Internal Medicine

## 2018-09-13 ENCOUNTER — Ambulatory Visit (INDEPENDENT_AMBULATORY_CARE_PROVIDER_SITE_OTHER): Payer: Medicare Other | Admitting: Internal Medicine

## 2018-09-13 VITALS — BP 128/72 | HR 67 | Ht 68.0 in | Wt 155.2 lb

## 2018-09-13 DIAGNOSIS — I495 Sick sinus syndrome: Secondary | ICD-10-CM

## 2018-09-13 MED ORDER — RIVAROXABAN 15 MG PO TABS: 15.0000 mg | ORAL_TABLET | Freq: Every day | ORAL | 3 refills | Status: AC

## 2018-09-13 NOTE — Progress Notes (Signed)
Patient Care Team: Virgel Bouquet, MD as PCP - General (Internal Medicine)  HPI  [name] is a 85 yo male seen in followup for pacer implanted for tachybradycardia syndrome in 2001 with device generator replacement in 2006.     He has permanent atrial fib.  He has been on anticoagulation with apixoban.  s .   Echo 2013-March EF 45-50%.    Date Cr K Hgb  9/19 1.13 3.5 10.4         Some SOB and cough  Ambulates          Past Medical History  Diagnosis Date  . CHF (congestive heart failure)   . Atrial fibrillation   . HTN (hypertension)   . Arthritis     incl spinal stenosis  . Renal insufficiency     question degree  . Wears partial dentures     upper partial  . Wears glasses     reading          Past Surgical History  Procedure Laterality Date  . Bilateral shoulder surgery    . Tonsillectomy    . Prosthetic hip - bilaterally    . R knee quadricep tendon repair    . I&d extremity  12/07/2011    Procedure: IRRIGATION AND DEBRIDEMENT EXTREMITY;  Surgeon: Tennis Must, MD;  Location: [facility];  Service: Orthopedics;  Laterality: Left;  Marland Kitchen Eye surgery      both catararcts  . Trigger finger release Right 05/26/2013    Procedure: RIGHT SMALL TRIGGER RELEASE ;  Surgeon: Tennis Must, MD;  Location: [place];  Service: Orthopedics;  Laterality: Right;  . Pacemaker insertion      BSX pacemaker generator change 11-28-2013 by Dr Caryl Comes   Current Outpatient Medications on File Prior to Visit  Medication Sig Dispense Refill  . acetaminophen (TYLENOL) 500 MG tablet Take 1,000 mg by mouth 2 (two) times daily. scheduled    . amoxicillin (AMOXIL) 500 MG capsule Take 500 mg by mouth See admin instructions. Take 4 capsules (2000 mg) by mouth prior to dental appointment    . Ascorbic Acid (VITAMIN C) 1000 MG tablet Take 1,000 mg by mouth daily.    Marland Kitchen b complex vitamins tablet Take 1 tablet by mouth daily.    .  carbidopa-levodopa (SINEMET IR) 25-250 MG tablet Take 1 tablet by mouth daily.    . carvedilol (COREG) 3.125 MG tablet Take 1 tablet by mouth 2 (two) times daily.    . Cholecalciferol (VITAMIN D-3) 5000 UNITS TABS Take 5,000 Units by mouth daily.    Marland Kitchen docusate sodium (COLACE) 100 MG capsule Take 100 mg by mouth daily.    Marland Kitchen doxycycline (VIBRAMYCIN) 100 MG capsule Take 1 capsule by mouth 2 (two) times daily.    . finasteride (PROSCAR) 5 MG tablet Take 5 mg by mouth daily.     . fluticasone (VERAMYST) 27.5 MCG/SPRAY nasal spray Place 2 sprays into the nose daily.    . furosemide (LASIX) 20 MG tablet Take 20 mg by mouth daily.    Marland Kitchen ipratropium-albuterol (DUONEB) 0.5-2.5 (3) MG/3ML SOLN Inhale 1 mL into the lungs every 6 (six) hours as needed for wheezing.    Marland Kitchen levothyroxine (SYNTHROID, LEVOTHROID) 25 MCG tablet Take 1 tablet by mouth daily.    Marland Kitchen lisinopril (PRINIVIL,ZESTRIL) 5 MG tablet Take 1 tablet by mouth daily.    . Mouthwashes (BIOTENE DRY MOUTH MT) Use as directed 10 mLs in the mouth  or throat daily as needed (dry mouth).    . Omega-3 Fatty Acids (FISH OIL) 1200 MG CAPS Take 2,400 mg by mouth 2 (two) times daily.    Marland Kitchen oxybutynin (DITROPAN-XL) 5 MG 24 hr tablet Take 5 mg by mouth at bedtime.    . Polyethylene Glycol 3350 (MIRALAX PO) Take 1 Dose by mouth 3 (three) times a week. Takes on M/W/F    . polyvinyl alcohol (LIQUIFILM TEARS) 1.4 % ophthalmic solution Place 1 drop into both eyes 5 (five) times daily as needed for dry eyes.     . pravastatin (PRAVACHOL) 20 MG tablet Take 1 tablet by mouth daily.    . Probiotic Product (PROBIOTIC DAILY PO) Take 1 capsule by mouth daily.    . rasagiline (AZILECT) 0.5 MG TABS tablet Take 1 tablet by mouth daily.    . RESTASIS 0.05 % ophthalmic emulsion Place 1 drop into both eyes 2 (two) times daily.    Marland Kitchen senna (SENOKOT) 8.6 MG TABS tablet Take 1 tablet by mouth 2 (two) times daily.    . Tamsulosin HCl (FLOMAX) 0.4 MG CAPS Take 0.4 mg by mouth 2 (two) times  daily.     . traMADol (ULTRAM) 50 MG tablet Take 100 mg by mouth 2 (two) times daily. scheduled    . XARELTO 20 MG TABS tablet Take 1 tablet by mouth daily.     No current facility-administered medications on file prior to visit.      No current facility-administered medications for this visit.         Allergies  Allergen Reactions  . Amiodarone Other (See Comments)     fluid on lungs    Review of Systems negative except from HPI and PMH  Physical Exam BP 128/72   Pulse 67   Ht 5\' 8"  (1.727 m)   Wt 155 lb 3.2 oz (70.4 kg)   SpO2 97%   BMI 23.60 kg/m  Well developed and nourished in no acute distress HENT normal Neck supple with JVP-flat R> L crackles  Regular rate and rhythm, no murmurs or gallops Abd-soft with active BS No Clubbing cyanosis edema Skin-warm and dry A & Oriented  In a wheel chair Grossly normal sensory and motor function     ECG atrial fib with intermittent Venous pacing  Assessment and  Plan  Atrial fibrillation-permanent  Bradycardia-device dependent  Renal insufficiency grade 3  Dyspnea on exertion   Pacemaker  [employer] The patient's device was interrogated.  The information was reviewed. No changes were made in the programming.      Renal insufficiency problems decrease of his Xarelto dose from 20--15 with a GFR of 40-45  Pulmonary exam is abnormal suggested CXR  I

## 2018-09-13 NOTE — Patient Instructions (Signed)
Medication Instructions:  Your physician has recommended you make the following change in your medication:   Begin Xarelto, 15mg  tablet, once daily with dinner  Labwork: You will have labs drawn today: CBC and BMP  Testing/Procedures: None ordered.  Follow-Up: Your physician recommends that you schedule a follow-up appointment in:   One Year with Dr Caryl Comes    Any Other Special Instructions Will Be Listed Below (If Applicable).     If you need a refill on your cardiac medications before your next appointment, please call your pharmacy.

## 2018-09-14 DIAGNOSIS — M6281 Muscle weakness (generalized): Secondary | ICD-10-CM | POA: Diagnosis not present

## 2018-09-14 DIAGNOSIS — M25519 Pain in unspecified shoulder: Secondary | ICD-10-CM | POA: Diagnosis not present

## 2018-09-14 DIAGNOSIS — R296 Repeated falls: Secondary | ICD-10-CM | POA: Diagnosis not present

## 2018-09-14 DIAGNOSIS — I2510 Atherosclerotic heart disease of native coronary artery without angina pectoris: Secondary | ICD-10-CM | POA: Diagnosis not present

## 2018-09-14 LAB — CUP PACEART INCLINIC DEVICE CHECK
Date Time Interrogation Session: 20200218164010
Implantable Lead Implant Date: 20000401
Implantable Lead Location: 753860
Implantable Lead Model: 4245
Implantable Lead Serial Number: 402472
Implantable Pulse Generator Implant Date: 20150505
Pulse Gen Serial Number: 390662

## 2018-09-14 LAB — BASIC METABOLIC PANEL
BUN/Creatinine Ratio: 14 (ref 10–24)
BUN: 16 mg/dL (ref 10–36)
CO2: 24 mmol/L (ref 20–29)
Calcium: 8.7 mg/dL (ref 8.6–10.2)
Chloride: 99 mmol/L (ref 96–106)
Creatinine, Ser: 1.17 mg/dL (ref 0.76–1.27)
GFR calc Af Amer: 63 mL/min/{1.73_m2} (ref >59)
GFR calc non Af Amer: 55 mL/min/{1.73_m2} — ABNORMAL LOW (ref >59)
Glucose: 179 mg/dL — ABNORMAL HIGH (ref 65–99)
Potassium: 4.1 mmol/L (ref 3.5–5.2)
Sodium: 138 mmol/L (ref 134–144)

## 2018-09-14 LAB — CBC
Hematocrit: 32.8 % — ABNORMAL LOW (ref 37.5–51.0)
Hemoglobin: 10.5 g/dL — ABNORMAL LOW (ref 13.0–17.7)
MCH: 30.7 pg (ref 26.6–33.0)
MCHC: 32 g/dL (ref 31.5–35.7)
MCV: 96 fL (ref 79–97)
Platelets: 237 10*3/uL (ref 150–450)
RBC: 3.42 x10E6/uL — ABNORMAL LOW (ref 4.14–5.80)
RDW: 13.3 % (ref 11.6–15.4)
WBC: 6.6 10*3/uL (ref 3.4–10.8)

## 2018-09-15 DIAGNOSIS — I2510 Atherosclerotic heart disease of native coronary artery without angina pectoris: Secondary | ICD-10-CM | POA: Diagnosis not present

## 2018-09-15 DIAGNOSIS — M25519 Pain in unspecified shoulder: Secondary | ICD-10-CM | POA: Diagnosis not present

## 2018-09-15 DIAGNOSIS — M6281 Muscle weakness (generalized): Secondary | ICD-10-CM | POA: Diagnosis not present

## 2018-09-15 DIAGNOSIS — R296 Repeated falls: Secondary | ICD-10-CM | POA: Diagnosis not present

## 2018-09-16 DIAGNOSIS — M25519 Pain in unspecified shoulder: Secondary | ICD-10-CM | POA: Diagnosis not present

## 2018-09-16 DIAGNOSIS — M6281 Muscle weakness (generalized): Secondary | ICD-10-CM | POA: Diagnosis not present

## 2018-09-16 DIAGNOSIS — R296 Repeated falls: Secondary | ICD-10-CM | POA: Diagnosis not present

## 2018-09-16 DIAGNOSIS — I2510 Atherosclerotic heart disease of native coronary artery without angina pectoris: Secondary | ICD-10-CM | POA: Diagnosis not present

## 2018-09-17 DIAGNOSIS — M6281 Muscle weakness (generalized): Secondary | ICD-10-CM | POA: Diagnosis not present

## 2018-09-17 DIAGNOSIS — I2510 Atherosclerotic heart disease of native coronary artery without angina pectoris: Secondary | ICD-10-CM | POA: Diagnosis not present

## 2018-09-17 DIAGNOSIS — R296 Repeated falls: Secondary | ICD-10-CM | POA: Diagnosis not present

## 2018-09-17 DIAGNOSIS — M25519 Pain in unspecified shoulder: Secondary | ICD-10-CM | POA: Diagnosis not present

## 2018-09-18 DIAGNOSIS — M25519 Pain in unspecified shoulder: Secondary | ICD-10-CM | POA: Diagnosis not present

## 2018-09-18 DIAGNOSIS — I2510 Atherosclerotic heart disease of native coronary artery without angina pectoris: Secondary | ICD-10-CM | POA: Diagnosis not present

## 2018-09-18 DIAGNOSIS — R296 Repeated falls: Secondary | ICD-10-CM | POA: Diagnosis not present

## 2018-09-18 DIAGNOSIS — M6281 Muscle weakness (generalized): Secondary | ICD-10-CM | POA: Diagnosis not present

## 2018-09-19 DIAGNOSIS — M25519 Pain in unspecified shoulder: Secondary | ICD-10-CM | POA: Diagnosis not present

## 2018-09-19 DIAGNOSIS — R296 Repeated falls: Secondary | ICD-10-CM | POA: Diagnosis not present

## 2018-09-19 DIAGNOSIS — M6281 Muscle weakness (generalized): Secondary | ICD-10-CM | POA: Diagnosis not present

## 2018-09-19 DIAGNOSIS — I2510 Atherosclerotic heart disease of native coronary artery without angina pectoris: Secondary | ICD-10-CM | POA: Diagnosis not present

## 2018-09-21 DIAGNOSIS — R296 Repeated falls: Secondary | ICD-10-CM | POA: Diagnosis not present

## 2018-09-21 DIAGNOSIS — M6281 Muscle weakness (generalized): Secondary | ICD-10-CM | POA: Diagnosis not present

## 2018-09-21 DIAGNOSIS — M25519 Pain in unspecified shoulder: Secondary | ICD-10-CM | POA: Diagnosis not present

## 2018-09-21 DIAGNOSIS — I2510 Atherosclerotic heart disease of native coronary artery without angina pectoris: Secondary | ICD-10-CM | POA: Diagnosis not present

## 2018-09-22 DIAGNOSIS — M25519 Pain in unspecified shoulder: Secondary | ICD-10-CM | POA: Diagnosis not present

## 2018-09-22 DIAGNOSIS — M6281 Muscle weakness (generalized): Secondary | ICD-10-CM | POA: Diagnosis not present

## 2018-09-22 DIAGNOSIS — R296 Repeated falls: Secondary | ICD-10-CM | POA: Diagnosis not present

## 2018-09-22 DIAGNOSIS — I2510 Atherosclerotic heart disease of native coronary artery without angina pectoris: Secondary | ICD-10-CM | POA: Diagnosis not present

## 2018-09-23 DIAGNOSIS — M25519 Pain in unspecified shoulder: Secondary | ICD-10-CM | POA: Diagnosis not present

## 2018-09-23 DIAGNOSIS — R296 Repeated falls: Secondary | ICD-10-CM | POA: Diagnosis not present

## 2018-09-23 DIAGNOSIS — I2510 Atherosclerotic heart disease of native coronary artery without angina pectoris: Secondary | ICD-10-CM | POA: Diagnosis not present

## 2018-09-23 DIAGNOSIS — M6281 Muscle weakness (generalized): Secondary | ICD-10-CM | POA: Diagnosis not present

## 2018-09-24 DIAGNOSIS — I2510 Atherosclerotic heart disease of native coronary artery without angina pectoris: Secondary | ICD-10-CM | POA: Diagnosis not present

## 2018-09-24 DIAGNOSIS — R296 Repeated falls: Secondary | ICD-10-CM | POA: Diagnosis not present

## 2018-09-24 DIAGNOSIS — M25519 Pain in unspecified shoulder: Secondary | ICD-10-CM | POA: Diagnosis not present

## 2018-09-24 DIAGNOSIS — M6281 Muscle weakness (generalized): Secondary | ICD-10-CM | POA: Diagnosis not present

## 2018-09-27 DIAGNOSIS — M25519 Pain in unspecified shoulder: Secondary | ICD-10-CM | POA: Diagnosis not present

## 2018-09-27 DIAGNOSIS — I2510 Atherosclerotic heart disease of native coronary artery without angina pectoris: Secondary | ICD-10-CM | POA: Diagnosis not present

## 2018-09-27 DIAGNOSIS — R296 Repeated falls: Secondary | ICD-10-CM | POA: Diagnosis not present

## 2018-09-27 DIAGNOSIS — M6281 Muscle weakness (generalized): Secondary | ICD-10-CM | POA: Diagnosis not present

## 2018-09-28 DIAGNOSIS — I2510 Atherosclerotic heart disease of native coronary artery without angina pectoris: Secondary | ICD-10-CM | POA: Diagnosis not present

## 2018-09-28 DIAGNOSIS — R296 Repeated falls: Secondary | ICD-10-CM | POA: Diagnosis not present

## 2018-09-28 DIAGNOSIS — M25519 Pain in unspecified shoulder: Secondary | ICD-10-CM | POA: Diagnosis not present

## 2018-09-28 DIAGNOSIS — M6281 Muscle weakness (generalized): Secondary | ICD-10-CM | POA: Diagnosis not present

## 2018-09-29 DIAGNOSIS — I2510 Atherosclerotic heart disease of native coronary artery without angina pectoris: Secondary | ICD-10-CM | POA: Diagnosis not present

## 2018-09-29 DIAGNOSIS — M25519 Pain in unspecified shoulder: Secondary | ICD-10-CM | POA: Diagnosis not present

## 2018-09-29 DIAGNOSIS — M6281 Muscle weakness (generalized): Secondary | ICD-10-CM | POA: Diagnosis not present

## 2018-09-29 DIAGNOSIS — R296 Repeated falls: Secondary | ICD-10-CM | POA: Diagnosis not present

## 2018-09-30 DIAGNOSIS — I2510 Atherosclerotic heart disease of native coronary artery without angina pectoris: Secondary | ICD-10-CM | POA: Diagnosis not present

## 2018-09-30 DIAGNOSIS — M25519 Pain in unspecified shoulder: Secondary | ICD-10-CM | POA: Diagnosis not present

## 2018-09-30 DIAGNOSIS — M6281 Muscle weakness (generalized): Secondary | ICD-10-CM | POA: Diagnosis not present

## 2018-09-30 DIAGNOSIS — R296 Repeated falls: Secondary | ICD-10-CM | POA: Diagnosis not present

## 2018-10-01 DIAGNOSIS — M25519 Pain in unspecified shoulder: Secondary | ICD-10-CM | POA: Diagnosis not present

## 2018-10-01 DIAGNOSIS — I2510 Atherosclerotic heart disease of native coronary artery without angina pectoris: Secondary | ICD-10-CM | POA: Diagnosis not present

## 2018-10-01 DIAGNOSIS — M6281 Muscle weakness (generalized): Secondary | ICD-10-CM | POA: Diagnosis not present

## 2018-10-01 DIAGNOSIS — R296 Repeated falls: Secondary | ICD-10-CM | POA: Diagnosis not present

## 2018-10-04 DIAGNOSIS — M6281 Muscle weakness (generalized): Secondary | ICD-10-CM | POA: Diagnosis not present

## 2018-10-04 DIAGNOSIS — I2510 Atherosclerotic heart disease of native coronary artery without angina pectoris: Secondary | ICD-10-CM | POA: Diagnosis not present

## 2018-10-04 DIAGNOSIS — M25519 Pain in unspecified shoulder: Secondary | ICD-10-CM | POA: Diagnosis not present

## 2018-10-04 DIAGNOSIS — R296 Repeated falls: Secondary | ICD-10-CM | POA: Diagnosis not present

## 2018-10-05 DIAGNOSIS — M6281 Muscle weakness (generalized): Secondary | ICD-10-CM | POA: Diagnosis not present

## 2018-10-05 DIAGNOSIS — M25519 Pain in unspecified shoulder: Secondary | ICD-10-CM | POA: Diagnosis not present

## 2018-10-05 DIAGNOSIS — I2510 Atherosclerotic heart disease of native coronary artery without angina pectoris: Secondary | ICD-10-CM | POA: Diagnosis not present

## 2018-10-05 DIAGNOSIS — G20 Parkinson's disease: Secondary | ICD-10-CM | POA: Diagnosis not present

## 2018-10-05 DIAGNOSIS — G4709 Other insomnia: Secondary | ICD-10-CM | POA: Diagnosis not present

## 2018-10-05 DIAGNOSIS — F0391 Unspecified dementia with behavioral disturbance: Secondary | ICD-10-CM | POA: Diagnosis not present

## 2018-10-05 DIAGNOSIS — R296 Repeated falls: Secondary | ICD-10-CM | POA: Diagnosis not present

## 2018-10-06 DIAGNOSIS — M6281 Muscle weakness (generalized): Secondary | ICD-10-CM | POA: Diagnosis not present

## 2018-10-06 DIAGNOSIS — R296 Repeated falls: Secondary | ICD-10-CM | POA: Diagnosis not present

## 2018-10-06 DIAGNOSIS — I2510 Atherosclerotic heart disease of native coronary artery without angina pectoris: Secondary | ICD-10-CM | POA: Diagnosis not present

## 2018-10-06 DIAGNOSIS — M25519 Pain in unspecified shoulder: Secondary | ICD-10-CM | POA: Diagnosis not present

## 2018-10-07 DIAGNOSIS — M25519 Pain in unspecified shoulder: Secondary | ICD-10-CM | POA: Diagnosis not present

## 2018-10-07 DIAGNOSIS — M6281 Muscle weakness (generalized): Secondary | ICD-10-CM | POA: Diagnosis not present

## 2018-10-07 DIAGNOSIS — R296 Repeated falls: Secondary | ICD-10-CM | POA: Diagnosis not present

## 2018-10-07 DIAGNOSIS — I2510 Atherosclerotic heart disease of native coronary artery without angina pectoris: Secondary | ICD-10-CM | POA: Diagnosis not present

## 2018-10-08 DIAGNOSIS — R296 Repeated falls: Secondary | ICD-10-CM | POA: Diagnosis not present

## 2018-10-08 DIAGNOSIS — M6281 Muscle weakness (generalized): Secondary | ICD-10-CM | POA: Diagnosis not present

## 2018-10-08 DIAGNOSIS — I2510 Atherosclerotic heart disease of native coronary artery without angina pectoris: Secondary | ICD-10-CM | POA: Diagnosis not present

## 2018-10-08 DIAGNOSIS — M25519 Pain in unspecified shoulder: Secondary | ICD-10-CM | POA: Diagnosis not present

## 2018-10-09 DIAGNOSIS — I2510 Atherosclerotic heart disease of native coronary artery without angina pectoris: Secondary | ICD-10-CM | POA: Diagnosis not present

## 2018-10-09 DIAGNOSIS — M6281 Muscle weakness (generalized): Secondary | ICD-10-CM | POA: Diagnosis not present

## 2018-10-09 DIAGNOSIS — M25519 Pain in unspecified shoulder: Secondary | ICD-10-CM | POA: Diagnosis not present

## 2018-10-09 DIAGNOSIS — R296 Repeated falls: Secondary | ICD-10-CM | POA: Diagnosis not present

## 2018-10-11 DIAGNOSIS — G20 Parkinson's disease: Secondary | ICD-10-CM | POA: Diagnosis not present

## 2018-10-11 DIAGNOSIS — I509 Heart failure, unspecified: Secondary | ICD-10-CM | POA: Diagnosis not present

## 2018-10-11 DIAGNOSIS — E039 Hypothyroidism, unspecified: Secondary | ICD-10-CM | POA: Diagnosis not present

## 2018-10-11 DIAGNOSIS — M6281 Muscle weakness (generalized): Secondary | ICD-10-CM | POA: Diagnosis not present

## 2018-10-11 DIAGNOSIS — M25519 Pain in unspecified shoulder: Secondary | ICD-10-CM | POA: Diagnosis not present

## 2018-10-11 DIAGNOSIS — I2510 Atherosclerotic heart disease of native coronary artery without angina pectoris: Secondary | ICD-10-CM | POA: Diagnosis not present

## 2018-10-11 DIAGNOSIS — R634 Abnormal weight loss: Secondary | ICD-10-CM | POA: Diagnosis not present

## 2018-10-11 DIAGNOSIS — R296 Repeated falls: Secondary | ICD-10-CM | POA: Diagnosis not present

## 2018-10-12 DIAGNOSIS — I2510 Atherosclerotic heart disease of native coronary artery without angina pectoris: Secondary | ICD-10-CM | POA: Diagnosis not present

## 2018-10-12 DIAGNOSIS — R296 Repeated falls: Secondary | ICD-10-CM | POA: Diagnosis not present

## 2018-10-12 DIAGNOSIS — M6281 Muscle weakness (generalized): Secondary | ICD-10-CM | POA: Diagnosis not present

## 2018-10-12 DIAGNOSIS — M25519 Pain in unspecified shoulder: Secondary | ICD-10-CM | POA: Diagnosis not present

## 2018-10-13 DIAGNOSIS — R296 Repeated falls: Secondary | ICD-10-CM | POA: Diagnosis not present

## 2018-10-13 DIAGNOSIS — M6281 Muscle weakness (generalized): Secondary | ICD-10-CM | POA: Diagnosis not present

## 2018-10-13 DIAGNOSIS — I2510 Atherosclerotic heart disease of native coronary artery without angina pectoris: Secondary | ICD-10-CM | POA: Diagnosis not present

## 2018-10-13 DIAGNOSIS — M25519 Pain in unspecified shoulder: Secondary | ICD-10-CM | POA: Diagnosis not present

## 2018-10-14 DIAGNOSIS — M6281 Muscle weakness (generalized): Secondary | ICD-10-CM | POA: Diagnosis not present

## 2018-10-14 DIAGNOSIS — R296 Repeated falls: Secondary | ICD-10-CM | POA: Diagnosis not present

## 2018-10-14 DIAGNOSIS — M25519 Pain in unspecified shoulder: Secondary | ICD-10-CM | POA: Diagnosis not present

## 2018-10-14 DIAGNOSIS — I2510 Atherosclerotic heart disease of native coronary artery without angina pectoris: Secondary | ICD-10-CM | POA: Diagnosis not present

## 2018-10-15 DIAGNOSIS — M6281 Muscle weakness (generalized): Secondary | ICD-10-CM | POA: Diagnosis not present

## 2018-10-15 DIAGNOSIS — M25519 Pain in unspecified shoulder: Secondary | ICD-10-CM | POA: Diagnosis not present

## 2018-10-15 DIAGNOSIS — I2510 Atherosclerotic heart disease of native coronary artery without angina pectoris: Secondary | ICD-10-CM | POA: Diagnosis not present

## 2018-10-15 DIAGNOSIS — R296 Repeated falls: Secondary | ICD-10-CM | POA: Diagnosis not present

## 2018-10-18 DIAGNOSIS — M25519 Pain in unspecified shoulder: Secondary | ICD-10-CM | POA: Diagnosis not present

## 2018-10-18 DIAGNOSIS — R296 Repeated falls: Secondary | ICD-10-CM | POA: Diagnosis not present

## 2018-10-18 DIAGNOSIS — I2510 Atherosclerotic heart disease of native coronary artery without angina pectoris: Secondary | ICD-10-CM | POA: Diagnosis not present

## 2018-10-18 DIAGNOSIS — M6281 Muscle weakness (generalized): Secondary | ICD-10-CM | POA: Diagnosis not present

## 2018-10-19 DIAGNOSIS — I2510 Atherosclerotic heart disease of native coronary artery without angina pectoris: Secondary | ICD-10-CM | POA: Diagnosis not present

## 2018-10-19 DIAGNOSIS — M6281 Muscle weakness (generalized): Secondary | ICD-10-CM | POA: Diagnosis not present

## 2018-10-19 DIAGNOSIS — R296 Repeated falls: Secondary | ICD-10-CM | POA: Diagnosis not present

## 2018-10-19 DIAGNOSIS — M25519 Pain in unspecified shoulder: Secondary | ICD-10-CM | POA: Diagnosis not present

## 2018-10-20 DIAGNOSIS — R296 Repeated falls: Secondary | ICD-10-CM | POA: Diagnosis not present

## 2018-10-20 DIAGNOSIS — I2510 Atherosclerotic heart disease of native coronary artery without angina pectoris: Secondary | ICD-10-CM | POA: Diagnosis not present

## 2018-10-20 DIAGNOSIS — M6281 Muscle weakness (generalized): Secondary | ICD-10-CM | POA: Diagnosis not present

## 2018-10-20 DIAGNOSIS — M25519 Pain in unspecified shoulder: Secondary | ICD-10-CM | POA: Diagnosis not present

## 2018-10-21 DIAGNOSIS — M6281 Muscle weakness (generalized): Secondary | ICD-10-CM | POA: Diagnosis not present

## 2018-10-21 DIAGNOSIS — M25519 Pain in unspecified shoulder: Secondary | ICD-10-CM | POA: Diagnosis not present

## 2018-10-21 DIAGNOSIS — I2510 Atherosclerotic heart disease of native coronary artery without angina pectoris: Secondary | ICD-10-CM | POA: Diagnosis not present

## 2018-10-21 DIAGNOSIS — R296 Repeated falls: Secondary | ICD-10-CM | POA: Diagnosis not present

## 2018-10-22 DIAGNOSIS — M25519 Pain in unspecified shoulder: Secondary | ICD-10-CM | POA: Diagnosis not present

## 2018-10-22 DIAGNOSIS — M6281 Muscle weakness (generalized): Secondary | ICD-10-CM | POA: Diagnosis not present

## 2018-10-22 DIAGNOSIS — R296 Repeated falls: Secondary | ICD-10-CM | POA: Diagnosis not present

## 2018-10-22 DIAGNOSIS — I2510 Atherosclerotic heart disease of native coronary artery without angina pectoris: Secondary | ICD-10-CM | POA: Diagnosis not present

## 2018-10-25 DIAGNOSIS — M6281 Muscle weakness (generalized): Secondary | ICD-10-CM | POA: Diagnosis not present

## 2018-10-25 DIAGNOSIS — R296 Repeated falls: Secondary | ICD-10-CM | POA: Diagnosis not present

## 2018-10-25 DIAGNOSIS — M25519 Pain in unspecified shoulder: Secondary | ICD-10-CM | POA: Diagnosis not present

## 2018-10-25 DIAGNOSIS — I2510 Atherosclerotic heart disease of native coronary artery without angina pectoris: Secondary | ICD-10-CM | POA: Diagnosis not present

## 2018-10-26 DIAGNOSIS — M25519 Pain in unspecified shoulder: Secondary | ICD-10-CM | POA: Diagnosis not present

## 2018-10-26 DIAGNOSIS — R296 Repeated falls: Secondary | ICD-10-CM | POA: Diagnosis not present

## 2018-10-26 DIAGNOSIS — M6281 Muscle weakness (generalized): Secondary | ICD-10-CM | POA: Diagnosis not present

## 2018-10-26 DIAGNOSIS — I2510 Atherosclerotic heart disease of native coronary artery without angina pectoris: Secondary | ICD-10-CM | POA: Diagnosis not present

## 2018-10-27 DIAGNOSIS — Z9181 History of falling: Secondary | ICD-10-CM | POA: Diagnosis not present

## 2018-10-27 DIAGNOSIS — S22079D Unspecified fracture of T9-T10 vertebra, subsequent encounter for fracture with routine healing: Secondary | ICD-10-CM | POA: Diagnosis not present

## 2018-10-27 DIAGNOSIS — I2510 Atherosclerotic heart disease of native coronary artery without angina pectoris: Secondary | ICD-10-CM | POA: Diagnosis not present

## 2018-10-27 DIAGNOSIS — R296 Repeated falls: Secondary | ICD-10-CM | POA: Diagnosis not present

## 2018-10-27 DIAGNOSIS — M25519 Pain in unspecified shoulder: Secondary | ICD-10-CM | POA: Diagnosis not present

## 2018-10-27 DIAGNOSIS — R2689 Other abnormalities of gait and mobility: Secondary | ICD-10-CM | POA: Diagnosis not present

## 2018-10-27 DIAGNOSIS — G20 Parkinson's disease: Secondary | ICD-10-CM | POA: Diagnosis not present

## 2018-10-27 DIAGNOSIS — M6281 Muscle weakness (generalized): Secondary | ICD-10-CM | POA: Diagnosis not present

## 2018-10-28 DIAGNOSIS — I2510 Atherosclerotic heart disease of native coronary artery without angina pectoris: Secondary | ICD-10-CM | POA: Diagnosis not present

## 2018-10-28 DIAGNOSIS — G20 Parkinson's disease: Secondary | ICD-10-CM | POA: Diagnosis not present

## 2018-10-28 DIAGNOSIS — Z9181 History of falling: Secondary | ICD-10-CM | POA: Diagnosis not present

## 2018-10-28 DIAGNOSIS — R2689 Other abnormalities of gait and mobility: Secondary | ICD-10-CM | POA: Diagnosis not present

## 2018-10-28 DIAGNOSIS — M6281 Muscle weakness (generalized): Secondary | ICD-10-CM | POA: Diagnosis not present

## 2018-10-28 DIAGNOSIS — S22079D Unspecified fracture of T9-T10 vertebra, subsequent encounter for fracture with routine healing: Secondary | ICD-10-CM | POA: Diagnosis not present

## 2018-10-29 DIAGNOSIS — Z9181 History of falling: Secondary | ICD-10-CM | POA: Diagnosis not present

## 2018-10-29 DIAGNOSIS — G20 Parkinson's disease: Secondary | ICD-10-CM | POA: Diagnosis not present

## 2018-10-29 DIAGNOSIS — S22079D Unspecified fracture of T9-T10 vertebra, subsequent encounter for fracture with routine healing: Secondary | ICD-10-CM | POA: Diagnosis not present

## 2018-10-29 DIAGNOSIS — M6281 Muscle weakness (generalized): Secondary | ICD-10-CM | POA: Diagnosis not present

## 2018-10-29 DIAGNOSIS — R2689 Other abnormalities of gait and mobility: Secondary | ICD-10-CM | POA: Diagnosis not present

## 2018-10-29 DIAGNOSIS — I2510 Atherosclerotic heart disease of native coronary artery without angina pectoris: Secondary | ICD-10-CM | POA: Diagnosis not present

## 2018-11-01 DIAGNOSIS — W19XXXD Unspecified fall, subsequent encounter: Secondary | ICD-10-CM | POA: Diagnosis not present

## 2018-11-01 DIAGNOSIS — G20 Parkinson's disease: Secondary | ICD-10-CM | POA: Diagnosis not present

## 2018-11-01 DIAGNOSIS — H3530 Unspecified macular degeneration: Secondary | ICD-10-CM | POA: Diagnosis not present

## 2018-11-02 DIAGNOSIS — M6281 Muscle weakness (generalized): Secondary | ICD-10-CM | POA: Diagnosis not present

## 2018-11-02 DIAGNOSIS — F0391 Unspecified dementia with behavioral disturbance: Secondary | ICD-10-CM | POA: Diagnosis not present

## 2018-11-02 DIAGNOSIS — Z9181 History of falling: Secondary | ICD-10-CM | POA: Diagnosis not present

## 2018-11-02 DIAGNOSIS — G20 Parkinson's disease: Secondary | ICD-10-CM | POA: Diagnosis not present

## 2018-11-02 DIAGNOSIS — I2510 Atherosclerotic heart disease of native coronary artery without angina pectoris: Secondary | ICD-10-CM | POA: Diagnosis not present

## 2018-11-02 DIAGNOSIS — R2689 Other abnormalities of gait and mobility: Secondary | ICD-10-CM | POA: Diagnosis not present

## 2018-11-02 DIAGNOSIS — S22079D Unspecified fracture of T9-T10 vertebra, subsequent encounter for fracture with routine healing: Secondary | ICD-10-CM | POA: Diagnosis not present

## 2018-11-02 DIAGNOSIS — G4709 Other insomnia: Secondary | ICD-10-CM | POA: Diagnosis not present

## 2018-11-04 DIAGNOSIS — R2689 Other abnormalities of gait and mobility: Secondary | ICD-10-CM | POA: Diagnosis not present

## 2018-11-04 DIAGNOSIS — G20 Parkinson's disease: Secondary | ICD-10-CM | POA: Diagnosis not present

## 2018-11-04 DIAGNOSIS — Z9181 History of falling: Secondary | ICD-10-CM | POA: Diagnosis not present

## 2018-11-04 DIAGNOSIS — S22079D Unspecified fracture of T9-T10 vertebra, subsequent encounter for fracture with routine healing: Secondary | ICD-10-CM | POA: Diagnosis not present

## 2018-11-04 DIAGNOSIS — I2510 Atherosclerotic heart disease of native coronary artery without angina pectoris: Secondary | ICD-10-CM | POA: Diagnosis not present

## 2018-11-04 DIAGNOSIS — M6281 Muscle weakness (generalized): Secondary | ICD-10-CM | POA: Diagnosis not present

## 2018-11-05 DIAGNOSIS — R2689 Other abnormalities of gait and mobility: Secondary | ICD-10-CM | POA: Diagnosis not present

## 2018-11-05 DIAGNOSIS — S22079D Unspecified fracture of T9-T10 vertebra, subsequent encounter for fracture with routine healing: Secondary | ICD-10-CM | POA: Diagnosis not present

## 2018-11-05 DIAGNOSIS — I2510 Atherosclerotic heart disease of native coronary artery without angina pectoris: Secondary | ICD-10-CM | POA: Diagnosis not present

## 2018-11-05 DIAGNOSIS — M6281 Muscle weakness (generalized): Secondary | ICD-10-CM | POA: Diagnosis not present

## 2018-11-05 DIAGNOSIS — G20 Parkinson's disease: Secondary | ICD-10-CM | POA: Diagnosis not present

## 2018-11-05 DIAGNOSIS — Z9181 History of falling: Secondary | ICD-10-CM | POA: Diagnosis not present

## 2018-11-06 DIAGNOSIS — G20 Parkinson's disease: Secondary | ICD-10-CM | POA: Diagnosis not present

## 2018-11-06 DIAGNOSIS — Z9181 History of falling: Secondary | ICD-10-CM | POA: Diagnosis not present

## 2018-11-06 DIAGNOSIS — M6281 Muscle weakness (generalized): Secondary | ICD-10-CM | POA: Diagnosis not present

## 2018-11-06 DIAGNOSIS — I2510 Atherosclerotic heart disease of native coronary artery without angina pectoris: Secondary | ICD-10-CM | POA: Diagnosis not present

## 2018-11-06 DIAGNOSIS — S22079D Unspecified fracture of T9-T10 vertebra, subsequent encounter for fracture with routine healing: Secondary | ICD-10-CM | POA: Diagnosis not present

## 2018-11-06 DIAGNOSIS — R2689 Other abnormalities of gait and mobility: Secondary | ICD-10-CM | POA: Diagnosis not present

## 2018-11-07 DIAGNOSIS — Z9181 History of falling: Secondary | ICD-10-CM | POA: Diagnosis not present

## 2018-11-07 DIAGNOSIS — M6281 Muscle weakness (generalized): Secondary | ICD-10-CM | POA: Diagnosis not present

## 2018-11-07 DIAGNOSIS — G20 Parkinson's disease: Secondary | ICD-10-CM | POA: Diagnosis not present

## 2018-11-07 DIAGNOSIS — R2689 Other abnormalities of gait and mobility: Secondary | ICD-10-CM | POA: Diagnosis not present

## 2018-11-07 DIAGNOSIS — I2510 Atherosclerotic heart disease of native coronary artery without angina pectoris: Secondary | ICD-10-CM | POA: Diagnosis not present

## 2018-11-07 DIAGNOSIS — S22079D Unspecified fracture of T9-T10 vertebra, subsequent encounter for fracture with routine healing: Secondary | ICD-10-CM | POA: Diagnosis not present

## 2018-11-08 DIAGNOSIS — R2689 Other abnormalities of gait and mobility: Secondary | ICD-10-CM | POA: Diagnosis not present

## 2018-11-08 DIAGNOSIS — S22079D Unspecified fracture of T9-T10 vertebra, subsequent encounter for fracture with routine healing: Secondary | ICD-10-CM | POA: Diagnosis not present

## 2018-11-08 DIAGNOSIS — I2510 Atherosclerotic heart disease of native coronary artery without angina pectoris: Secondary | ICD-10-CM | POA: Diagnosis not present

## 2018-11-08 DIAGNOSIS — M6281 Muscle weakness (generalized): Secondary | ICD-10-CM | POA: Diagnosis not present

## 2018-11-08 DIAGNOSIS — G20 Parkinson's disease: Secondary | ICD-10-CM | POA: Diagnosis not present

## 2018-11-08 DIAGNOSIS — Z9181 History of falling: Secondary | ICD-10-CM | POA: Diagnosis not present

## 2018-11-09 DIAGNOSIS — M6281 Muscle weakness (generalized): Secondary | ICD-10-CM | POA: Diagnosis not present

## 2018-11-09 DIAGNOSIS — R2689 Other abnormalities of gait and mobility: Secondary | ICD-10-CM | POA: Diagnosis not present

## 2018-11-09 DIAGNOSIS — Z9181 History of falling: Secondary | ICD-10-CM | POA: Diagnosis not present

## 2018-11-09 DIAGNOSIS — G20 Parkinson's disease: Secondary | ICD-10-CM | POA: Diagnosis not present

## 2018-11-09 DIAGNOSIS — S22079D Unspecified fracture of T9-T10 vertebra, subsequent encounter for fracture with routine healing: Secondary | ICD-10-CM | POA: Diagnosis not present

## 2018-11-09 DIAGNOSIS — I2510 Atherosclerotic heart disease of native coronary artery without angina pectoris: Secondary | ICD-10-CM | POA: Diagnosis not present

## 2018-11-10 DIAGNOSIS — M6281 Muscle weakness (generalized): Secondary | ICD-10-CM | POA: Diagnosis not present

## 2018-11-10 DIAGNOSIS — I2510 Atherosclerotic heart disease of native coronary artery without angina pectoris: Secondary | ICD-10-CM | POA: Diagnosis not present

## 2018-11-10 DIAGNOSIS — G20 Parkinson's disease: Secondary | ICD-10-CM | POA: Diagnosis not present

## 2018-11-10 DIAGNOSIS — S22079D Unspecified fracture of T9-T10 vertebra, subsequent encounter for fracture with routine healing: Secondary | ICD-10-CM | POA: Diagnosis not present

## 2018-11-10 DIAGNOSIS — R2689 Other abnormalities of gait and mobility: Secondary | ICD-10-CM | POA: Diagnosis not present

## 2018-11-10 DIAGNOSIS — Z9181 History of falling: Secondary | ICD-10-CM | POA: Diagnosis not present

## 2018-11-11 DIAGNOSIS — I2510 Atherosclerotic heart disease of native coronary artery without angina pectoris: Secondary | ICD-10-CM | POA: Diagnosis not present

## 2018-11-11 DIAGNOSIS — M6281 Muscle weakness (generalized): Secondary | ICD-10-CM | POA: Diagnosis not present

## 2018-11-11 DIAGNOSIS — G20 Parkinson's disease: Secondary | ICD-10-CM | POA: Diagnosis not present

## 2018-11-11 DIAGNOSIS — S22079D Unspecified fracture of T9-T10 vertebra, subsequent encounter for fracture with routine healing: Secondary | ICD-10-CM | POA: Diagnosis not present

## 2018-11-11 DIAGNOSIS — R2689 Other abnormalities of gait and mobility: Secondary | ICD-10-CM | POA: Diagnosis not present

## 2018-11-11 DIAGNOSIS — Z9181 History of falling: Secondary | ICD-10-CM | POA: Diagnosis not present

## 2018-11-14 DIAGNOSIS — Z9181 History of falling: Secondary | ICD-10-CM | POA: Diagnosis not present

## 2018-11-14 DIAGNOSIS — G20 Parkinson's disease: Secondary | ICD-10-CM | POA: Diagnosis not present

## 2018-11-14 DIAGNOSIS — M6281 Muscle weakness (generalized): Secondary | ICD-10-CM | POA: Diagnosis not present

## 2018-11-14 DIAGNOSIS — S22079D Unspecified fracture of T9-T10 vertebra, subsequent encounter for fracture with routine healing: Secondary | ICD-10-CM | POA: Diagnosis not present

## 2018-11-14 DIAGNOSIS — I2510 Atherosclerotic heart disease of native coronary artery without angina pectoris: Secondary | ICD-10-CM | POA: Diagnosis not present

## 2018-11-14 DIAGNOSIS — R2689 Other abnormalities of gait and mobility: Secondary | ICD-10-CM | POA: Diagnosis not present

## 2018-11-16 DIAGNOSIS — R2689 Other abnormalities of gait and mobility: Secondary | ICD-10-CM | POA: Diagnosis not present

## 2018-11-16 DIAGNOSIS — Z9181 History of falling: Secondary | ICD-10-CM | POA: Diagnosis not present

## 2018-11-16 DIAGNOSIS — G20 Parkinson's disease: Secondary | ICD-10-CM | POA: Diagnosis not present

## 2018-11-16 DIAGNOSIS — S22079D Unspecified fracture of T9-T10 vertebra, subsequent encounter for fracture with routine healing: Secondary | ICD-10-CM | POA: Diagnosis not present

## 2018-11-16 DIAGNOSIS — M6281 Muscle weakness (generalized): Secondary | ICD-10-CM | POA: Diagnosis not present

## 2018-11-16 DIAGNOSIS — I2510 Atherosclerotic heart disease of native coronary artery without angina pectoris: Secondary | ICD-10-CM | POA: Diagnosis not present

## 2018-11-18 DIAGNOSIS — R2689 Other abnormalities of gait and mobility: Secondary | ICD-10-CM | POA: Diagnosis not present

## 2018-11-18 DIAGNOSIS — G20 Parkinson's disease: Secondary | ICD-10-CM | POA: Diagnosis not present

## 2018-11-18 DIAGNOSIS — S22079D Unspecified fracture of T9-T10 vertebra, subsequent encounter for fracture with routine healing: Secondary | ICD-10-CM | POA: Diagnosis not present

## 2018-11-18 DIAGNOSIS — M6281 Muscle weakness (generalized): Secondary | ICD-10-CM | POA: Diagnosis not present

## 2018-11-18 DIAGNOSIS — Z9181 History of falling: Secondary | ICD-10-CM | POA: Diagnosis not present

## 2018-11-18 DIAGNOSIS — I2510 Atherosclerotic heart disease of native coronary artery without angina pectoris: Secondary | ICD-10-CM | POA: Diagnosis not present

## 2018-11-21 DIAGNOSIS — R2689 Other abnormalities of gait and mobility: Secondary | ICD-10-CM | POA: Diagnosis not present

## 2018-11-21 DIAGNOSIS — I2510 Atherosclerotic heart disease of native coronary artery without angina pectoris: Secondary | ICD-10-CM | POA: Diagnosis not present

## 2018-11-21 DIAGNOSIS — S22079D Unspecified fracture of T9-T10 vertebra, subsequent encounter for fracture with routine healing: Secondary | ICD-10-CM | POA: Diagnosis not present

## 2018-11-21 DIAGNOSIS — M6281 Muscle weakness (generalized): Secondary | ICD-10-CM | POA: Diagnosis not present

## 2018-11-21 DIAGNOSIS — G20 Parkinson's disease: Secondary | ICD-10-CM | POA: Diagnosis not present

## 2018-11-21 DIAGNOSIS — Z9181 History of falling: Secondary | ICD-10-CM | POA: Diagnosis not present

## 2018-11-22 DIAGNOSIS — S22079D Unspecified fracture of T9-T10 vertebra, subsequent encounter for fracture with routine healing: Secondary | ICD-10-CM | POA: Diagnosis not present

## 2018-11-22 DIAGNOSIS — Z9181 History of falling: Secondary | ICD-10-CM | POA: Diagnosis not present

## 2018-11-22 DIAGNOSIS — G20 Parkinson's disease: Secondary | ICD-10-CM | POA: Diagnosis not present

## 2018-11-22 DIAGNOSIS — R2689 Other abnormalities of gait and mobility: Secondary | ICD-10-CM | POA: Diagnosis not present

## 2018-11-22 DIAGNOSIS — M6281 Muscle weakness (generalized): Secondary | ICD-10-CM | POA: Diagnosis not present

## 2018-11-22 DIAGNOSIS — I2510 Atherosclerotic heart disease of native coronary artery without angina pectoris: Secondary | ICD-10-CM | POA: Diagnosis not present

## 2018-11-23 DIAGNOSIS — M6281 Muscle weakness (generalized): Secondary | ICD-10-CM | POA: Diagnosis not present

## 2018-11-23 DIAGNOSIS — S22079D Unspecified fracture of T9-T10 vertebra, subsequent encounter for fracture with routine healing: Secondary | ICD-10-CM | POA: Diagnosis not present

## 2018-11-23 DIAGNOSIS — I2510 Atherosclerotic heart disease of native coronary artery without angina pectoris: Secondary | ICD-10-CM | POA: Diagnosis not present

## 2018-11-23 DIAGNOSIS — M545 Low back pain: Secondary | ICD-10-CM | POA: Diagnosis not present

## 2018-11-23 DIAGNOSIS — Z9181 History of falling: Secondary | ICD-10-CM | POA: Diagnosis not present

## 2018-11-23 DIAGNOSIS — G20 Parkinson's disease: Secondary | ICD-10-CM | POA: Diagnosis not present

## 2018-11-23 DIAGNOSIS — R2689 Other abnormalities of gait and mobility: Secondary | ICD-10-CM | POA: Diagnosis not present

## 2018-11-23 DIAGNOSIS — H04123 Dry eye syndrome of bilateral lacrimal glands: Secondary | ICD-10-CM | POA: Diagnosis not present

## 2018-11-25 DIAGNOSIS — R2689 Other abnormalities of gait and mobility: Secondary | ICD-10-CM | POA: Diagnosis not present

## 2018-11-25 DIAGNOSIS — Z9181 History of falling: Secondary | ICD-10-CM | POA: Diagnosis not present

## 2018-11-25 DIAGNOSIS — S22079D Unspecified fracture of T9-T10 vertebra, subsequent encounter for fracture with routine healing: Secondary | ICD-10-CM | POA: Diagnosis not present

## 2018-11-25 DIAGNOSIS — I2510 Atherosclerotic heart disease of native coronary artery without angina pectoris: Secondary | ICD-10-CM | POA: Diagnosis not present

## 2018-11-25 DIAGNOSIS — M6281 Muscle weakness (generalized): Secondary | ICD-10-CM | POA: Diagnosis not present

## 2018-11-25 DIAGNOSIS — G20 Parkinson's disease: Secondary | ICD-10-CM | POA: Diagnosis not present

## 2018-11-26 DIAGNOSIS — M6281 Muscle weakness (generalized): Secondary | ICD-10-CM | POA: Diagnosis not present

## 2018-11-26 DIAGNOSIS — R2689 Other abnormalities of gait and mobility: Secondary | ICD-10-CM | POA: Diagnosis not present

## 2018-11-26 DIAGNOSIS — R296 Repeated falls: Secondary | ICD-10-CM | POA: Diagnosis not present

## 2018-11-26 DIAGNOSIS — Z9181 History of falling: Secondary | ICD-10-CM | POA: Diagnosis not present

## 2018-11-26 DIAGNOSIS — G20 Parkinson's disease: Secondary | ICD-10-CM | POA: Diagnosis not present

## 2018-11-26 DIAGNOSIS — M25519 Pain in unspecified shoulder: Secondary | ICD-10-CM | POA: Diagnosis not present

## 2018-11-28 DIAGNOSIS — G20 Parkinson's disease: Secondary | ICD-10-CM | POA: Diagnosis not present

## 2018-11-28 DIAGNOSIS — R296 Repeated falls: Secondary | ICD-10-CM | POA: Diagnosis not present

## 2018-11-28 DIAGNOSIS — R2689 Other abnormalities of gait and mobility: Secondary | ICD-10-CM | POA: Diagnosis not present

## 2018-11-28 DIAGNOSIS — M6281 Muscle weakness (generalized): Secondary | ICD-10-CM | POA: Diagnosis not present

## 2018-11-28 DIAGNOSIS — Z9181 History of falling: Secondary | ICD-10-CM | POA: Diagnosis not present

## 2018-11-28 DIAGNOSIS — M25519 Pain in unspecified shoulder: Secondary | ICD-10-CM | POA: Diagnosis not present

## 2018-11-29 ENCOUNTER — Other Ambulatory Visit: Payer: Self-pay | Admitting: *Deleted

## 2018-11-29 ENCOUNTER — Encounter: Payer: Self-pay | Admitting: *Deleted

## 2018-11-29 DIAGNOSIS — M6281 Muscle weakness (generalized): Secondary | ICD-10-CM | POA: Diagnosis not present

## 2018-11-29 DIAGNOSIS — R296 Repeated falls: Secondary | ICD-10-CM | POA: Diagnosis not present

## 2018-11-29 DIAGNOSIS — Z9181 History of falling: Secondary | ICD-10-CM | POA: Diagnosis not present

## 2018-11-29 DIAGNOSIS — R2689 Other abnormalities of gait and mobility: Secondary | ICD-10-CM | POA: Diagnosis not present

## 2018-11-29 DIAGNOSIS — G20 Parkinson's disease: Secondary | ICD-10-CM | POA: Diagnosis not present

## 2018-11-29 DIAGNOSIS — M25519 Pain in unspecified shoulder: Secondary | ICD-10-CM | POA: Diagnosis not present

## 2018-11-29 NOTE — Patient Outreach (Signed)
Tier 4 screening. Outreach attempts to numbers listed in [place]. Both lines disconnected.  Plan to mail UTR letter. Unable to confirm from notes but patient may be LTC resident of [facility], he was admitted there in fall, no discharge date found.  Royetta Crochet. Laymond Purser, MSN, RN, [employer] , [place] [phone]) Business Cell  [phone]) Toll Free Office

## 2018-11-30 DIAGNOSIS — G20 Parkinson's disease: Secondary | ICD-10-CM | POA: Diagnosis not present

## 2018-11-30 DIAGNOSIS — M6281 Muscle weakness (generalized): Secondary | ICD-10-CM | POA: Diagnosis not present

## 2018-11-30 DIAGNOSIS — R2689 Other abnormalities of gait and mobility: Secondary | ICD-10-CM | POA: Diagnosis not present

## 2018-11-30 DIAGNOSIS — R296 Repeated falls: Secondary | ICD-10-CM | POA: Diagnosis not present

## 2018-11-30 DIAGNOSIS — Z9181 History of falling: Secondary | ICD-10-CM | POA: Diagnosis not present

## 2018-11-30 DIAGNOSIS — M25519 Pain in unspecified shoulder: Secondary | ICD-10-CM | POA: Diagnosis not present

## 2018-12-01 DIAGNOSIS — G20 Parkinson's disease: Secondary | ICD-10-CM | POA: Diagnosis not present

## 2018-12-01 DIAGNOSIS — Z9181 History of falling: Secondary | ICD-10-CM | POA: Diagnosis not present

## 2018-12-01 DIAGNOSIS — R2689 Other abnormalities of gait and mobility: Secondary | ICD-10-CM | POA: Diagnosis not present

## 2018-12-01 DIAGNOSIS — M25519 Pain in unspecified shoulder: Secondary | ICD-10-CM | POA: Diagnosis not present

## 2018-12-01 DIAGNOSIS — R296 Repeated falls: Secondary | ICD-10-CM | POA: Diagnosis not present

## 2018-12-01 DIAGNOSIS — M6281 Muscle weakness (generalized): Secondary | ICD-10-CM | POA: Diagnosis not present

## 2018-12-02 DIAGNOSIS — M6281 Muscle weakness (generalized): Secondary | ICD-10-CM | POA: Diagnosis not present

## 2018-12-02 DIAGNOSIS — R296 Repeated falls: Secondary | ICD-10-CM | POA: Diagnosis not present

## 2018-12-02 DIAGNOSIS — R2689 Other abnormalities of gait and mobility: Secondary | ICD-10-CM | POA: Diagnosis not present

## 2018-12-02 DIAGNOSIS — Z9181 History of falling: Secondary | ICD-10-CM | POA: Diagnosis not present

## 2018-12-02 DIAGNOSIS — G20 Parkinson's disease: Secondary | ICD-10-CM | POA: Diagnosis not present

## 2018-12-02 DIAGNOSIS — M25519 Pain in unspecified shoulder: Secondary | ICD-10-CM | POA: Diagnosis not present

## 2018-12-05 DIAGNOSIS — M25519 Pain in unspecified shoulder: Secondary | ICD-10-CM | POA: Diagnosis not present

## 2018-12-05 DIAGNOSIS — Z9181 History of falling: Secondary | ICD-10-CM | POA: Diagnosis not present

## 2018-12-05 DIAGNOSIS — R296 Repeated falls: Secondary | ICD-10-CM | POA: Diagnosis not present

## 2018-12-05 DIAGNOSIS — R2689 Other abnormalities of gait and mobility: Secondary | ICD-10-CM | POA: Diagnosis not present

## 2018-12-05 DIAGNOSIS — G20 Parkinson's disease: Secondary | ICD-10-CM | POA: Diagnosis not present

## 2018-12-05 DIAGNOSIS — M6281 Muscle weakness (generalized): Secondary | ICD-10-CM | POA: Diagnosis not present

## 2018-12-06 DIAGNOSIS — R296 Repeated falls: Secondary | ICD-10-CM | POA: Diagnosis not present

## 2018-12-06 DIAGNOSIS — Z9181 History of falling: Secondary | ICD-10-CM | POA: Diagnosis not present

## 2018-12-06 DIAGNOSIS — R2689 Other abnormalities of gait and mobility: Secondary | ICD-10-CM | POA: Diagnosis not present

## 2018-12-06 DIAGNOSIS — M25519 Pain in unspecified shoulder: Secondary | ICD-10-CM | POA: Diagnosis not present

## 2018-12-06 DIAGNOSIS — G20 Parkinson's disease: Secondary | ICD-10-CM | POA: Diagnosis not present

## 2018-12-06 DIAGNOSIS — M6281 Muscle weakness (generalized): Secondary | ICD-10-CM | POA: Diagnosis not present

## 2018-12-07 DIAGNOSIS — M25519 Pain in unspecified shoulder: Secondary | ICD-10-CM | POA: Diagnosis not present

## 2018-12-07 DIAGNOSIS — G20 Parkinson's disease: Secondary | ICD-10-CM | POA: Diagnosis not present

## 2018-12-07 DIAGNOSIS — R2689 Other abnormalities of gait and mobility: Secondary | ICD-10-CM | POA: Diagnosis not present

## 2018-12-07 DIAGNOSIS — F0391 Unspecified dementia with behavioral disturbance: Secondary | ICD-10-CM | POA: Diagnosis not present

## 2018-12-07 DIAGNOSIS — M6281 Muscle weakness (generalized): Secondary | ICD-10-CM | POA: Diagnosis not present

## 2018-12-07 DIAGNOSIS — G4709 Other insomnia: Secondary | ICD-10-CM | POA: Diagnosis not present

## 2018-12-07 DIAGNOSIS — Z9181 History of falling: Secondary | ICD-10-CM | POA: Diagnosis not present

## 2018-12-07 DIAGNOSIS — R4589 Other symptoms and signs involving emotional state: Secondary | ICD-10-CM | POA: Diagnosis not present

## 2018-12-07 DIAGNOSIS — R296 Repeated falls: Secondary | ICD-10-CM | POA: Diagnosis not present

## 2018-12-08 DIAGNOSIS — Z7901 Long term (current) use of anticoagulants: Secondary | ICD-10-CM | POA: Diagnosis not present

## 2018-12-08 DIAGNOSIS — E039 Hypothyroidism, unspecified: Secondary | ICD-10-CM | POA: Diagnosis not present

## 2018-12-08 DIAGNOSIS — I110 Hypertensive heart disease with heart failure: Secondary | ICD-10-CM | POA: Diagnosis not present

## 2018-12-08 DIAGNOSIS — I482 Chronic atrial fibrillation, unspecified: Secondary | ICD-10-CM | POA: Diagnosis not present

## 2018-12-11 DIAGNOSIS — R296 Repeated falls: Secondary | ICD-10-CM | POA: Diagnosis not present

## 2018-12-11 DIAGNOSIS — Z9181 History of falling: Secondary | ICD-10-CM | POA: Diagnosis not present

## 2018-12-11 DIAGNOSIS — G20 Parkinson's disease: Secondary | ICD-10-CM | POA: Diagnosis not present

## 2018-12-11 DIAGNOSIS — R2689 Other abnormalities of gait and mobility: Secondary | ICD-10-CM | POA: Diagnosis not present

## 2018-12-11 DIAGNOSIS — M6281 Muscle weakness (generalized): Secondary | ICD-10-CM | POA: Diagnosis not present

## 2018-12-11 DIAGNOSIS — M25519 Pain in unspecified shoulder: Secondary | ICD-10-CM | POA: Diagnosis not present

## 2018-12-12 DIAGNOSIS — M25519 Pain in unspecified shoulder: Secondary | ICD-10-CM | POA: Diagnosis not present

## 2018-12-12 DIAGNOSIS — G20 Parkinson's disease: Secondary | ICD-10-CM | POA: Diagnosis not present

## 2018-12-12 DIAGNOSIS — R2689 Other abnormalities of gait and mobility: Secondary | ICD-10-CM | POA: Diagnosis not present

## 2018-12-12 DIAGNOSIS — R296 Repeated falls: Secondary | ICD-10-CM | POA: Diagnosis not present

## 2018-12-12 DIAGNOSIS — Z9181 History of falling: Secondary | ICD-10-CM | POA: Diagnosis not present

## 2018-12-12 DIAGNOSIS — M6281 Muscle weakness (generalized): Secondary | ICD-10-CM | POA: Diagnosis not present

## 2018-12-13 DIAGNOSIS — M6281 Muscle weakness (generalized): Secondary | ICD-10-CM | POA: Diagnosis not present

## 2018-12-13 DIAGNOSIS — R2689 Other abnormalities of gait and mobility: Secondary | ICD-10-CM | POA: Diagnosis not present

## 2018-12-13 DIAGNOSIS — J309 Allergic rhinitis, unspecified: Secondary | ICD-10-CM | POA: Diagnosis not present

## 2018-12-13 DIAGNOSIS — G20 Parkinson's disease: Secondary | ICD-10-CM | POA: Diagnosis not present

## 2018-12-13 DIAGNOSIS — R296 Repeated falls: Secondary | ICD-10-CM | POA: Diagnosis not present

## 2018-12-13 DIAGNOSIS — Z9181 History of falling: Secondary | ICD-10-CM | POA: Diagnosis not present

## 2018-12-13 DIAGNOSIS — M25519 Pain in unspecified shoulder: Secondary | ICD-10-CM | POA: Diagnosis not present

## 2018-12-14 ENCOUNTER — Other Ambulatory Visit: Payer: Self-pay

## 2018-12-14 ENCOUNTER — Ambulatory Visit (INDEPENDENT_AMBULATORY_CARE_PROVIDER_SITE_OTHER): Payer: Medicare Other | Admitting: *Deleted

## 2018-12-14 DIAGNOSIS — Z9181 History of falling: Secondary | ICD-10-CM | POA: Diagnosis not present

## 2018-12-14 DIAGNOSIS — M6281 Muscle weakness (generalized): Secondary | ICD-10-CM | POA: Diagnosis not present

## 2018-12-14 DIAGNOSIS — I495 Sick sinus syndrome: Secondary | ICD-10-CM

## 2018-12-14 DIAGNOSIS — G20 Parkinson's disease: Secondary | ICD-10-CM | POA: Diagnosis not present

## 2018-12-14 DIAGNOSIS — R2689 Other abnormalities of gait and mobility: Secondary | ICD-10-CM | POA: Diagnosis not present

## 2018-12-14 DIAGNOSIS — R296 Repeated falls: Secondary | ICD-10-CM | POA: Diagnosis not present

## 2018-12-14 DIAGNOSIS — M25519 Pain in unspecified shoulder: Secondary | ICD-10-CM | POA: Diagnosis not present

## 2018-12-15 LAB — CUP PACEART REMOTE DEVICE CHECK
Battery Remaining Longevity: 72 mo
Battery Remaining Percentage: 84 %
Brady Statistic RV Percent Paced: 83 %
Date Time Interrogation Session: 20200519080100
Implantable Lead Implant Date: 20000401
Implantable Lead Location: 753860
Implantable Lead Model: 4245
Implantable Lead Serial Number: 402472
Implantable Pulse Generator Implant Date: 20150505
Lead Channel Impedance Value: 437 Ohm
Lead Channel Pacing Threshold Amplitude: 0.8 V
Lead Channel Pacing Threshold Pulse Width: 0.4 ms
Lead Channel Setting Pacing Amplitude: 1.2 V
Lead Channel Setting Pacing Pulse Width: 0.4 ms
Lead Channel Setting Sensing Sensitivity: 2.5 mV
Pulse Gen Serial Number: 390662

## 2018-12-16 DIAGNOSIS — R2689 Other abnormalities of gait and mobility: Secondary | ICD-10-CM | POA: Diagnosis not present

## 2018-12-16 DIAGNOSIS — M6281 Muscle weakness (generalized): Secondary | ICD-10-CM | POA: Diagnosis not present

## 2018-12-16 DIAGNOSIS — R296 Repeated falls: Secondary | ICD-10-CM | POA: Diagnosis not present

## 2018-12-16 DIAGNOSIS — M25519 Pain in unspecified shoulder: Secondary | ICD-10-CM | POA: Diagnosis not present

## 2018-12-16 DIAGNOSIS — G20 Parkinson's disease: Secondary | ICD-10-CM | POA: Diagnosis not present

## 2018-12-16 DIAGNOSIS — Z9181 History of falling: Secondary | ICD-10-CM | POA: Diagnosis not present

## 2018-12-19 DIAGNOSIS — R296 Repeated falls: Secondary | ICD-10-CM | POA: Diagnosis not present

## 2018-12-19 DIAGNOSIS — M25519 Pain in unspecified shoulder: Secondary | ICD-10-CM | POA: Diagnosis not present

## 2018-12-19 DIAGNOSIS — M6281 Muscle weakness (generalized): Secondary | ICD-10-CM | POA: Diagnosis not present

## 2018-12-19 DIAGNOSIS — R2689 Other abnormalities of gait and mobility: Secondary | ICD-10-CM | POA: Diagnosis not present

## 2018-12-19 DIAGNOSIS — G20 Parkinson's disease: Secondary | ICD-10-CM | POA: Diagnosis not present

## 2018-12-19 DIAGNOSIS — Z9181 History of falling: Secondary | ICD-10-CM | POA: Diagnosis not present

## 2018-12-20 DIAGNOSIS — R2689 Other abnormalities of gait and mobility: Secondary | ICD-10-CM | POA: Diagnosis not present

## 2018-12-20 DIAGNOSIS — R296 Repeated falls: Secondary | ICD-10-CM | POA: Diagnosis not present

## 2018-12-20 DIAGNOSIS — G20 Parkinson's disease: Secondary | ICD-10-CM | POA: Diagnosis not present

## 2018-12-20 DIAGNOSIS — M6281 Muscle weakness (generalized): Secondary | ICD-10-CM | POA: Diagnosis not present

## 2018-12-20 DIAGNOSIS — M25519 Pain in unspecified shoulder: Secondary | ICD-10-CM | POA: Diagnosis not present

## 2018-12-20 DIAGNOSIS — Z9181 History of falling: Secondary | ICD-10-CM | POA: Diagnosis not present

## 2018-12-21 DIAGNOSIS — F329 Major depressive disorder, single episode, unspecified: Secondary | ICD-10-CM | POA: Diagnosis not present

## 2018-12-21 DIAGNOSIS — G20 Parkinson's disease: Secondary | ICD-10-CM | POA: Diagnosis not present

## 2018-12-21 DIAGNOSIS — M25519 Pain in unspecified shoulder: Secondary | ICD-10-CM | POA: Diagnosis not present

## 2018-12-21 DIAGNOSIS — R296 Repeated falls: Secondary | ICD-10-CM | POA: Diagnosis not present

## 2018-12-21 DIAGNOSIS — R2689 Other abnormalities of gait and mobility: Secondary | ICD-10-CM | POA: Diagnosis not present

## 2018-12-21 DIAGNOSIS — F0391 Unspecified dementia with behavioral disturbance: Secondary | ICD-10-CM | POA: Diagnosis not present

## 2018-12-21 DIAGNOSIS — Z9181 History of falling: Secondary | ICD-10-CM | POA: Diagnosis not present

## 2018-12-21 DIAGNOSIS — I110 Hypertensive heart disease with heart failure: Secondary | ICD-10-CM | POA: Diagnosis not present

## 2018-12-21 DIAGNOSIS — G4709 Other insomnia: Secondary | ICD-10-CM | POA: Diagnosis not present

## 2018-12-21 DIAGNOSIS — M6281 Muscle weakness (generalized): Secondary | ICD-10-CM | POA: Diagnosis not present

## 2018-12-23 ENCOUNTER — Encounter: Payer: Self-pay | Admitting: Cardiology

## 2018-12-23 DIAGNOSIS — Z9181 History of falling: Secondary | ICD-10-CM | POA: Diagnosis not present

## 2018-12-23 DIAGNOSIS — M25519 Pain in unspecified shoulder: Secondary | ICD-10-CM | POA: Diagnosis not present

## 2018-12-23 DIAGNOSIS — R2689 Other abnormalities of gait and mobility: Secondary | ICD-10-CM | POA: Diagnosis not present

## 2018-12-23 DIAGNOSIS — M6281 Muscle weakness (generalized): Secondary | ICD-10-CM | POA: Diagnosis not present

## 2018-12-23 DIAGNOSIS — G20 Parkinson's disease: Secondary | ICD-10-CM | POA: Diagnosis not present

## 2018-12-23 DIAGNOSIS — R296 Repeated falls: Secondary | ICD-10-CM | POA: Diagnosis not present

## 2018-12-23 NOTE — Progress Notes (Signed)
Remote pacemaker transmission.   

## 2018-12-24 DIAGNOSIS — G20 Parkinson's disease: Secondary | ICD-10-CM | POA: Diagnosis not present

## 2018-12-24 DIAGNOSIS — R2689 Other abnormalities of gait and mobility: Secondary | ICD-10-CM | POA: Diagnosis not present

## 2018-12-24 DIAGNOSIS — Z9181 History of falling: Secondary | ICD-10-CM | POA: Diagnosis not present

## 2018-12-24 DIAGNOSIS — M6281 Muscle weakness (generalized): Secondary | ICD-10-CM | POA: Diagnosis not present

## 2018-12-24 DIAGNOSIS — M25519 Pain in unspecified shoulder: Secondary | ICD-10-CM | POA: Diagnosis not present

## 2018-12-24 DIAGNOSIS — R296 Repeated falls: Secondary | ICD-10-CM | POA: Diagnosis not present

## 2018-12-25 DIAGNOSIS — Z9181 History of falling: Secondary | ICD-10-CM | POA: Diagnosis not present

## 2018-12-25 DIAGNOSIS — G20 Parkinson's disease: Secondary | ICD-10-CM | POA: Diagnosis not present

## 2018-12-25 DIAGNOSIS — M25519 Pain in unspecified shoulder: Secondary | ICD-10-CM | POA: Diagnosis not present

## 2018-12-25 DIAGNOSIS — R2689 Other abnormalities of gait and mobility: Secondary | ICD-10-CM | POA: Diagnosis not present

## 2018-12-25 DIAGNOSIS — M6281 Muscle weakness (generalized): Secondary | ICD-10-CM | POA: Diagnosis not present

## 2018-12-25 DIAGNOSIS — R296 Repeated falls: Secondary | ICD-10-CM | POA: Diagnosis not present

## 2018-12-26 DIAGNOSIS — M25519 Pain in unspecified shoulder: Secondary | ICD-10-CM | POA: Diagnosis not present

## 2018-12-26 DIAGNOSIS — G20 Parkinson's disease: Secondary | ICD-10-CM | POA: Diagnosis not present

## 2018-12-26 DIAGNOSIS — M6281 Muscle weakness (generalized): Secondary | ICD-10-CM | POA: Diagnosis not present

## 2018-12-26 DIAGNOSIS — R2689 Other abnormalities of gait and mobility: Secondary | ICD-10-CM | POA: Diagnosis not present

## 2018-12-26 DIAGNOSIS — R296 Repeated falls: Secondary | ICD-10-CM | POA: Diagnosis not present

## 2018-12-26 DIAGNOSIS — Z9181 History of falling: Secondary | ICD-10-CM | POA: Diagnosis not present

## 2018-12-27 DIAGNOSIS — Z9181 History of falling: Secondary | ICD-10-CM | POA: Diagnosis not present

## 2018-12-27 DIAGNOSIS — R296 Repeated falls: Secondary | ICD-10-CM | POA: Diagnosis not present

## 2018-12-27 DIAGNOSIS — R488 Other symbolic dysfunctions: Secondary | ICD-10-CM | POA: Diagnosis not present

## 2018-12-27 DIAGNOSIS — R471 Dysarthria and anarthria: Secondary | ICD-10-CM | POA: Diagnosis not present

## 2018-12-27 DIAGNOSIS — R2689 Other abnormalities of gait and mobility: Secondary | ICD-10-CM | POA: Diagnosis not present

## 2018-12-27 DIAGNOSIS — M25519 Pain in unspecified shoulder: Secondary | ICD-10-CM | POA: Diagnosis not present

## 2018-12-27 DIAGNOSIS — G20 Parkinson's disease: Secondary | ICD-10-CM | POA: Diagnosis not present

## 2018-12-27 DIAGNOSIS — R1312 Dysphagia, oropharyngeal phase: Secondary | ICD-10-CM | POA: Diagnosis not present

## 2018-12-27 DIAGNOSIS — M6281 Muscle weakness (generalized): Secondary | ICD-10-CM | POA: Diagnosis not present

## 2018-12-27 DIAGNOSIS — R41841 Cognitive communication deficit: Secondary | ICD-10-CM | POA: Diagnosis not present

## 2018-12-28 DIAGNOSIS — R41841 Cognitive communication deficit: Secondary | ICD-10-CM | POA: Diagnosis not present

## 2018-12-28 DIAGNOSIS — R1312 Dysphagia, oropharyngeal phase: Secondary | ICD-10-CM | POA: Diagnosis not present

## 2018-12-28 DIAGNOSIS — G20 Parkinson's disease: Secondary | ICD-10-CM | POA: Diagnosis not present

## 2018-12-28 DIAGNOSIS — R471 Dysarthria and anarthria: Secondary | ICD-10-CM | POA: Diagnosis not present

## 2018-12-28 DIAGNOSIS — R488 Other symbolic dysfunctions: Secondary | ICD-10-CM | POA: Diagnosis not present

## 2018-12-28 DIAGNOSIS — Z9181 History of falling: Secondary | ICD-10-CM | POA: Diagnosis not present

## 2018-12-29 DIAGNOSIS — R471 Dysarthria and anarthria: Secondary | ICD-10-CM | POA: Diagnosis not present

## 2018-12-29 DIAGNOSIS — G20 Parkinson's disease: Secondary | ICD-10-CM | POA: Diagnosis not present

## 2018-12-29 DIAGNOSIS — R41841 Cognitive communication deficit: Secondary | ICD-10-CM | POA: Diagnosis not present

## 2018-12-29 DIAGNOSIS — Z9181 History of falling: Secondary | ICD-10-CM | POA: Diagnosis not present

## 2018-12-29 DIAGNOSIS — R1312 Dysphagia, oropharyngeal phase: Secondary | ICD-10-CM | POA: Diagnosis not present

## 2018-12-29 DIAGNOSIS — R488 Other symbolic dysfunctions: Secondary | ICD-10-CM | POA: Diagnosis not present

## 2018-12-30 DIAGNOSIS — R471 Dysarthria and anarthria: Secondary | ICD-10-CM | POA: Diagnosis not present

## 2018-12-30 DIAGNOSIS — R41841 Cognitive communication deficit: Secondary | ICD-10-CM | POA: Diagnosis not present

## 2018-12-30 DIAGNOSIS — Z9181 History of falling: Secondary | ICD-10-CM | POA: Diagnosis not present

## 2018-12-30 DIAGNOSIS — G20 Parkinson's disease: Secondary | ICD-10-CM | POA: Diagnosis not present

## 2018-12-30 DIAGNOSIS — R488 Other symbolic dysfunctions: Secondary | ICD-10-CM | POA: Diagnosis not present

## 2018-12-30 DIAGNOSIS — R1312 Dysphagia, oropharyngeal phase: Secondary | ICD-10-CM | POA: Diagnosis not present

## 2019-01-01 DIAGNOSIS — R471 Dysarthria and anarthria: Secondary | ICD-10-CM | POA: Diagnosis not present

## 2019-01-01 DIAGNOSIS — G20 Parkinson's disease: Secondary | ICD-10-CM | POA: Diagnosis not present

## 2019-01-01 DIAGNOSIS — R1312 Dysphagia, oropharyngeal phase: Secondary | ICD-10-CM | POA: Diagnosis not present

## 2019-01-01 DIAGNOSIS — Z9181 History of falling: Secondary | ICD-10-CM | POA: Diagnosis not present

## 2019-01-01 DIAGNOSIS — R41841 Cognitive communication deficit: Secondary | ICD-10-CM | POA: Diagnosis not present

## 2019-01-01 DIAGNOSIS — R488 Other symbolic dysfunctions: Secondary | ICD-10-CM | POA: Diagnosis not present

## 2019-01-02 DIAGNOSIS — R1312 Dysphagia, oropharyngeal phase: Secondary | ICD-10-CM | POA: Diagnosis not present

## 2019-01-02 DIAGNOSIS — R41841 Cognitive communication deficit: Secondary | ICD-10-CM | POA: Diagnosis not present

## 2019-01-02 DIAGNOSIS — Z9181 History of falling: Secondary | ICD-10-CM | POA: Diagnosis not present

## 2019-01-02 DIAGNOSIS — R471 Dysarthria and anarthria: Secondary | ICD-10-CM | POA: Diagnosis not present

## 2019-01-02 DIAGNOSIS — G20 Parkinson's disease: Secondary | ICD-10-CM | POA: Diagnosis not present

## 2019-01-02 DIAGNOSIS — R488 Other symbolic dysfunctions: Secondary | ICD-10-CM | POA: Diagnosis not present

## 2019-01-04 DIAGNOSIS — R488 Other symbolic dysfunctions: Secondary | ICD-10-CM | POA: Diagnosis not present

## 2019-01-04 DIAGNOSIS — R1312 Dysphagia, oropharyngeal phase: Secondary | ICD-10-CM | POA: Diagnosis not present

## 2019-01-04 DIAGNOSIS — G20 Parkinson's disease: Secondary | ICD-10-CM | POA: Diagnosis not present

## 2019-01-04 DIAGNOSIS — Z9181 History of falling: Secondary | ICD-10-CM | POA: Diagnosis not present

## 2019-01-04 DIAGNOSIS — R471 Dysarthria and anarthria: Secondary | ICD-10-CM | POA: Diagnosis not present

## 2019-01-04 DIAGNOSIS — R41841 Cognitive communication deficit: Secondary | ICD-10-CM | POA: Diagnosis not present

## 2019-01-05 DIAGNOSIS — G20 Parkinson's disease: Secondary | ICD-10-CM | POA: Diagnosis not present

## 2019-01-05 DIAGNOSIS — R471 Dysarthria and anarthria: Secondary | ICD-10-CM | POA: Diagnosis not present

## 2019-01-05 DIAGNOSIS — Z9181 History of falling: Secondary | ICD-10-CM | POA: Diagnosis not present

## 2019-01-05 DIAGNOSIS — R41841 Cognitive communication deficit: Secondary | ICD-10-CM | POA: Diagnosis not present

## 2019-01-05 DIAGNOSIS — R1312 Dysphagia, oropharyngeal phase: Secondary | ICD-10-CM | POA: Diagnosis not present

## 2019-01-05 DIAGNOSIS — R488 Other symbolic dysfunctions: Secondary | ICD-10-CM | POA: Diagnosis not present

## 2019-01-06 DIAGNOSIS — R41841 Cognitive communication deficit: Secondary | ICD-10-CM | POA: Diagnosis not present

## 2019-01-06 DIAGNOSIS — Z9181 History of falling: Secondary | ICD-10-CM | POA: Diagnosis not present

## 2019-01-06 DIAGNOSIS — R471 Dysarthria and anarthria: Secondary | ICD-10-CM | POA: Diagnosis not present

## 2019-01-06 DIAGNOSIS — R488 Other symbolic dysfunctions: Secondary | ICD-10-CM | POA: Diagnosis not present

## 2019-01-06 DIAGNOSIS — G20 Parkinson's disease: Secondary | ICD-10-CM | POA: Diagnosis not present

## 2019-01-06 DIAGNOSIS — R1312 Dysphagia, oropharyngeal phase: Secondary | ICD-10-CM | POA: Diagnosis not present

## 2019-01-08 DIAGNOSIS — G20 Parkinson's disease: Secondary | ICD-10-CM | POA: Diagnosis not present

## 2019-01-08 DIAGNOSIS — R488 Other symbolic dysfunctions: Secondary | ICD-10-CM | POA: Diagnosis not present

## 2019-01-08 DIAGNOSIS — Z9181 History of falling: Secondary | ICD-10-CM | POA: Diagnosis not present

## 2019-01-08 DIAGNOSIS — R1312 Dysphagia, oropharyngeal phase: Secondary | ICD-10-CM | POA: Diagnosis not present

## 2019-01-08 DIAGNOSIS — R471 Dysarthria and anarthria: Secondary | ICD-10-CM | POA: Diagnosis not present

## 2019-01-08 DIAGNOSIS — R41841 Cognitive communication deficit: Secondary | ICD-10-CM | POA: Diagnosis not present

## 2019-01-09 DIAGNOSIS — R41841 Cognitive communication deficit: Secondary | ICD-10-CM | POA: Diagnosis not present

## 2019-01-09 DIAGNOSIS — R1312 Dysphagia, oropharyngeal phase: Secondary | ICD-10-CM | POA: Diagnosis not present

## 2019-01-09 DIAGNOSIS — G20 Parkinson's disease: Secondary | ICD-10-CM | POA: Diagnosis not present

## 2019-01-09 DIAGNOSIS — Z9181 History of falling: Secondary | ICD-10-CM | POA: Diagnosis not present

## 2019-01-09 DIAGNOSIS — R471 Dysarthria and anarthria: Secondary | ICD-10-CM | POA: Diagnosis not present

## 2019-01-09 DIAGNOSIS — R488 Other symbolic dysfunctions: Secondary | ICD-10-CM | POA: Diagnosis not present

## 2019-01-10 DIAGNOSIS — E559 Vitamin D deficiency, unspecified: Secondary | ICD-10-CM | POA: Diagnosis not present

## 2019-01-10 DIAGNOSIS — R488 Other symbolic dysfunctions: Secondary | ICD-10-CM | POA: Diagnosis not present

## 2019-01-10 DIAGNOSIS — R1312 Dysphagia, oropharyngeal phase: Secondary | ICD-10-CM | POA: Diagnosis not present

## 2019-01-10 DIAGNOSIS — Z79899 Other long term (current) drug therapy: Secondary | ICD-10-CM | POA: Diagnosis not present

## 2019-01-10 DIAGNOSIS — R471 Dysarthria and anarthria: Secondary | ICD-10-CM | POA: Diagnosis not present

## 2019-01-10 DIAGNOSIS — R41841 Cognitive communication deficit: Secondary | ICD-10-CM | POA: Diagnosis not present

## 2019-01-10 DIAGNOSIS — E785 Hyperlipidemia, unspecified: Secondary | ICD-10-CM | POA: Diagnosis not present

## 2019-01-10 DIAGNOSIS — E039 Hypothyroidism, unspecified: Secondary | ICD-10-CM | POA: Diagnosis not present

## 2019-01-10 DIAGNOSIS — G20 Parkinson's disease: Secondary | ICD-10-CM | POA: Diagnosis not present

## 2019-01-10 DIAGNOSIS — Z9181 History of falling: Secondary | ICD-10-CM | POA: Diagnosis not present

## 2019-01-11 DIAGNOSIS — E559 Vitamin D deficiency, unspecified: Secondary | ICD-10-CM | POA: Diagnosis not present

## 2019-01-11 DIAGNOSIS — F329 Major depressive disorder, single episode, unspecified: Secondary | ICD-10-CM | POA: Diagnosis not present

## 2019-01-11 DIAGNOSIS — G4709 Other insomnia: Secondary | ICD-10-CM | POA: Diagnosis not present

## 2019-01-11 DIAGNOSIS — G20 Parkinson's disease: Secondary | ICD-10-CM | POA: Diagnosis not present

## 2019-01-11 DIAGNOSIS — E785 Hyperlipidemia, unspecified: Secondary | ICD-10-CM | POA: Diagnosis not present

## 2019-01-11 DIAGNOSIS — E039 Hypothyroidism, unspecified: Secondary | ICD-10-CM | POA: Diagnosis not present

## 2019-01-11 DIAGNOSIS — Z79899 Other long term (current) drug therapy: Secondary | ICD-10-CM | POA: Diagnosis not present

## 2019-01-11 DIAGNOSIS — F0390 Unspecified dementia without behavioral disturbance: Secondary | ICD-10-CM | POA: Diagnosis not present

## 2019-01-12 DIAGNOSIS — E039 Hypothyroidism, unspecified: Secondary | ICD-10-CM | POA: Diagnosis not present

## 2019-01-12 DIAGNOSIS — R7303 Prediabetes: Secondary | ICD-10-CM | POA: Diagnosis not present

## 2019-01-12 DIAGNOSIS — R471 Dysarthria and anarthria: Secondary | ICD-10-CM | POA: Diagnosis not present

## 2019-01-12 DIAGNOSIS — Z9181 History of falling: Secondary | ICD-10-CM | POA: Diagnosis not present

## 2019-01-12 DIAGNOSIS — R1312 Dysphagia, oropharyngeal phase: Secondary | ICD-10-CM | POA: Diagnosis not present

## 2019-01-12 DIAGNOSIS — G20 Parkinson's disease: Secondary | ICD-10-CM | POA: Diagnosis not present

## 2019-01-12 DIAGNOSIS — E785 Hyperlipidemia, unspecified: Secondary | ICD-10-CM | POA: Diagnosis not present

## 2019-01-12 DIAGNOSIS — R488 Other symbolic dysfunctions: Secondary | ICD-10-CM | POA: Diagnosis not present

## 2019-01-12 DIAGNOSIS — R41841 Cognitive communication deficit: Secondary | ICD-10-CM | POA: Diagnosis not present

## 2019-01-12 DIAGNOSIS — D649 Anemia, unspecified: Secondary | ICD-10-CM | POA: Diagnosis not present

## 2019-01-13 DIAGNOSIS — R488 Other symbolic dysfunctions: Secondary | ICD-10-CM | POA: Diagnosis not present

## 2019-01-13 DIAGNOSIS — R41841 Cognitive communication deficit: Secondary | ICD-10-CM | POA: Diagnosis not present

## 2019-01-13 DIAGNOSIS — R1312 Dysphagia, oropharyngeal phase: Secondary | ICD-10-CM | POA: Diagnosis not present

## 2019-01-13 DIAGNOSIS — Z9181 History of falling: Secondary | ICD-10-CM | POA: Diagnosis not present

## 2019-01-13 DIAGNOSIS — G20 Parkinson's disease: Secondary | ICD-10-CM | POA: Diagnosis not present

## 2019-01-13 DIAGNOSIS — R471 Dysarthria and anarthria: Secondary | ICD-10-CM | POA: Diagnosis not present

## 2019-01-15 DIAGNOSIS — R1312 Dysphagia, oropharyngeal phase: Secondary | ICD-10-CM | POA: Diagnosis not present

## 2019-01-15 DIAGNOSIS — R471 Dysarthria and anarthria: Secondary | ICD-10-CM | POA: Diagnosis not present

## 2019-01-15 DIAGNOSIS — R41841 Cognitive communication deficit: Secondary | ICD-10-CM | POA: Diagnosis not present

## 2019-01-15 DIAGNOSIS — R488 Other symbolic dysfunctions: Secondary | ICD-10-CM | POA: Diagnosis not present

## 2019-01-15 DIAGNOSIS — G20 Parkinson's disease: Secondary | ICD-10-CM | POA: Diagnosis not present

## 2019-01-15 DIAGNOSIS — Z9181 History of falling: Secondary | ICD-10-CM | POA: Diagnosis not present

## 2019-01-16 DIAGNOSIS — R41841 Cognitive communication deficit: Secondary | ICD-10-CM | POA: Diagnosis not present

## 2019-01-16 DIAGNOSIS — R488 Other symbolic dysfunctions: Secondary | ICD-10-CM | POA: Diagnosis not present

## 2019-01-16 DIAGNOSIS — R471 Dysarthria and anarthria: Secondary | ICD-10-CM | POA: Diagnosis not present

## 2019-01-16 DIAGNOSIS — Z9181 History of falling: Secondary | ICD-10-CM | POA: Diagnosis not present

## 2019-01-16 DIAGNOSIS — R1312 Dysphagia, oropharyngeal phase: Secondary | ICD-10-CM | POA: Diagnosis not present

## 2019-01-16 DIAGNOSIS — G20 Parkinson's disease: Secondary | ICD-10-CM | POA: Diagnosis not present

## 2019-01-17 DIAGNOSIS — Z9181 History of falling: Secondary | ICD-10-CM | POA: Diagnosis not present

## 2019-01-17 DIAGNOSIS — R488 Other symbolic dysfunctions: Secondary | ICD-10-CM | POA: Diagnosis not present

## 2019-01-17 DIAGNOSIS — R471 Dysarthria and anarthria: Secondary | ICD-10-CM | POA: Diagnosis not present

## 2019-01-17 DIAGNOSIS — R41841 Cognitive communication deficit: Secondary | ICD-10-CM | POA: Diagnosis not present

## 2019-01-17 DIAGNOSIS — G20 Parkinson's disease: Secondary | ICD-10-CM | POA: Diagnosis not present

## 2019-01-17 DIAGNOSIS — R1312 Dysphagia, oropharyngeal phase: Secondary | ICD-10-CM | POA: Diagnosis not present

## 2019-01-19 DIAGNOSIS — Z9181 History of falling: Secondary | ICD-10-CM | POA: Diagnosis not present

## 2019-01-19 DIAGNOSIS — R1312 Dysphagia, oropharyngeal phase: Secondary | ICD-10-CM | POA: Diagnosis not present

## 2019-01-19 DIAGNOSIS — R488 Other symbolic dysfunctions: Secondary | ICD-10-CM | POA: Diagnosis not present

## 2019-01-19 DIAGNOSIS — G20 Parkinson's disease: Secondary | ICD-10-CM | POA: Diagnosis not present

## 2019-01-19 DIAGNOSIS — R471 Dysarthria and anarthria: Secondary | ICD-10-CM | POA: Diagnosis not present

## 2019-01-19 DIAGNOSIS — R41841 Cognitive communication deficit: Secondary | ICD-10-CM | POA: Diagnosis not present

## 2019-01-20 ENCOUNTER — Ambulatory Visit: Payer: Medicare Other | Admitting: Neurology

## 2019-01-20 DIAGNOSIS — G20 Parkinson's disease: Secondary | ICD-10-CM | POA: Diagnosis not present

## 2019-01-20 DIAGNOSIS — R41841 Cognitive communication deficit: Secondary | ICD-10-CM | POA: Diagnosis not present

## 2019-01-20 DIAGNOSIS — R1312 Dysphagia, oropharyngeal phase: Secondary | ICD-10-CM | POA: Diagnosis not present

## 2019-01-20 DIAGNOSIS — R488 Other symbolic dysfunctions: Secondary | ICD-10-CM | POA: Diagnosis not present

## 2019-01-20 DIAGNOSIS — Z9181 History of falling: Secondary | ICD-10-CM | POA: Diagnosis not present

## 2019-01-20 DIAGNOSIS — R471 Dysarthria and anarthria: Secondary | ICD-10-CM | POA: Diagnosis not present

## 2019-01-22 DIAGNOSIS — R471 Dysarthria and anarthria: Secondary | ICD-10-CM | POA: Diagnosis not present

## 2019-01-22 DIAGNOSIS — R41841 Cognitive communication deficit: Secondary | ICD-10-CM | POA: Diagnosis not present

## 2019-01-22 DIAGNOSIS — R488 Other symbolic dysfunctions: Secondary | ICD-10-CM | POA: Diagnosis not present

## 2019-01-22 DIAGNOSIS — G20 Parkinson's disease: Secondary | ICD-10-CM | POA: Diagnosis not present

## 2019-01-22 DIAGNOSIS — R1312 Dysphagia, oropharyngeal phase: Secondary | ICD-10-CM | POA: Diagnosis not present

## 2019-01-22 DIAGNOSIS — Z9181 History of falling: Secondary | ICD-10-CM | POA: Diagnosis not present

## 2019-01-23 DIAGNOSIS — R1312 Dysphagia, oropharyngeal phase: Secondary | ICD-10-CM | POA: Diagnosis not present

## 2019-01-23 DIAGNOSIS — R488 Other symbolic dysfunctions: Secondary | ICD-10-CM | POA: Diagnosis not present

## 2019-01-23 DIAGNOSIS — G20 Parkinson's disease: Secondary | ICD-10-CM | POA: Diagnosis not present

## 2019-01-23 DIAGNOSIS — Z9181 History of falling: Secondary | ICD-10-CM | POA: Diagnosis not present

## 2019-01-23 DIAGNOSIS — R471 Dysarthria and anarthria: Secondary | ICD-10-CM | POA: Diagnosis not present

## 2019-01-23 DIAGNOSIS — R41841 Cognitive communication deficit: Secondary | ICD-10-CM | POA: Diagnosis not present

## 2019-01-24 DIAGNOSIS — R488 Other symbolic dysfunctions: Secondary | ICD-10-CM | POA: Diagnosis not present

## 2019-01-24 DIAGNOSIS — Z9181 History of falling: Secondary | ICD-10-CM | POA: Diagnosis not present

## 2019-01-24 DIAGNOSIS — R1312 Dysphagia, oropharyngeal phase: Secondary | ICD-10-CM | POA: Diagnosis not present

## 2019-01-24 DIAGNOSIS — R471 Dysarthria and anarthria: Secondary | ICD-10-CM | POA: Diagnosis not present

## 2019-01-24 DIAGNOSIS — G20 Parkinson's disease: Secondary | ICD-10-CM | POA: Diagnosis not present

## 2019-01-24 DIAGNOSIS — R41841 Cognitive communication deficit: Secondary | ICD-10-CM | POA: Diagnosis not present

## 2019-01-25 DIAGNOSIS — Z9181 History of falling: Secondary | ICD-10-CM | POA: Diagnosis not present

## 2019-01-25 DIAGNOSIS — F329 Major depressive disorder, single episode, unspecified: Secondary | ICD-10-CM | POA: Diagnosis not present

## 2019-01-25 DIAGNOSIS — F0390 Unspecified dementia without behavioral disturbance: Secondary | ICD-10-CM | POA: Diagnosis not present

## 2019-01-25 DIAGNOSIS — G20 Parkinson's disease: Secondary | ICD-10-CM | POA: Diagnosis not present

## 2019-01-25 DIAGNOSIS — R41841 Cognitive communication deficit: Secondary | ICD-10-CM | POA: Diagnosis not present

## 2019-01-25 DIAGNOSIS — G4709 Other insomnia: Secondary | ICD-10-CM | POA: Diagnosis not present

## 2019-01-25 DIAGNOSIS — R488 Other symbolic dysfunctions: Secondary | ICD-10-CM | POA: Diagnosis not present

## 2019-01-25 DIAGNOSIS — R471 Dysarthria and anarthria: Secondary | ICD-10-CM | POA: Diagnosis not present

## 2019-01-25 DIAGNOSIS — R1312 Dysphagia, oropharyngeal phase: Secondary | ICD-10-CM | POA: Diagnosis not present

## 2019-01-26 DIAGNOSIS — R296 Repeated falls: Secondary | ICD-10-CM | POA: Diagnosis not present

## 2019-01-26 DIAGNOSIS — M25519 Pain in unspecified shoulder: Secondary | ICD-10-CM | POA: Diagnosis not present

## 2019-01-26 DIAGNOSIS — G20 Parkinson's disease: Secondary | ICD-10-CM | POA: Diagnosis not present

## 2019-01-26 DIAGNOSIS — R2689 Other abnormalities of gait and mobility: Secondary | ICD-10-CM | POA: Diagnosis not present

## 2019-01-26 DIAGNOSIS — R471 Dysarthria and anarthria: Secondary | ICD-10-CM | POA: Diagnosis not present

## 2019-01-26 DIAGNOSIS — R41841 Cognitive communication deficit: Secondary | ICD-10-CM | POA: Diagnosis not present

## 2019-01-26 DIAGNOSIS — R488 Other symbolic dysfunctions: Secondary | ICD-10-CM | POA: Diagnosis not present

## 2019-01-26 DIAGNOSIS — Z9181 History of falling: Secondary | ICD-10-CM | POA: Diagnosis not present

## 2019-01-26 DIAGNOSIS — M6281 Muscle weakness (generalized): Secondary | ICD-10-CM | POA: Diagnosis not present

## 2019-01-26 DIAGNOSIS — R1312 Dysphagia, oropharyngeal phase: Secondary | ICD-10-CM | POA: Diagnosis not present

## 2019-01-27 DIAGNOSIS — R2689 Other abnormalities of gait and mobility: Secondary | ICD-10-CM | POA: Diagnosis not present

## 2019-01-27 DIAGNOSIS — R296 Repeated falls: Secondary | ICD-10-CM | POA: Diagnosis not present

## 2019-01-27 DIAGNOSIS — M6281 Muscle weakness (generalized): Secondary | ICD-10-CM | POA: Diagnosis not present

## 2019-01-27 DIAGNOSIS — M25519 Pain in unspecified shoulder: Secondary | ICD-10-CM | POA: Diagnosis not present

## 2019-01-27 DIAGNOSIS — G20 Parkinson's disease: Secondary | ICD-10-CM | POA: Diagnosis not present

## 2019-01-27 DIAGNOSIS — Z9181 History of falling: Secondary | ICD-10-CM | POA: Diagnosis not present

## 2019-01-28 DIAGNOSIS — M25519 Pain in unspecified shoulder: Secondary | ICD-10-CM | POA: Diagnosis not present

## 2019-01-28 DIAGNOSIS — R2689 Other abnormalities of gait and mobility: Secondary | ICD-10-CM | POA: Diagnosis not present

## 2019-01-28 DIAGNOSIS — Z9181 History of falling: Secondary | ICD-10-CM | POA: Diagnosis not present

## 2019-01-28 DIAGNOSIS — G20 Parkinson's disease: Secondary | ICD-10-CM | POA: Diagnosis not present

## 2019-01-28 DIAGNOSIS — R296 Repeated falls: Secondary | ICD-10-CM | POA: Diagnosis not present

## 2019-01-28 DIAGNOSIS — M6281 Muscle weakness (generalized): Secondary | ICD-10-CM | POA: Diagnosis not present

## 2019-01-29 DIAGNOSIS — Z9181 History of falling: Secondary | ICD-10-CM | POA: Diagnosis not present

## 2019-01-29 DIAGNOSIS — R2689 Other abnormalities of gait and mobility: Secondary | ICD-10-CM | POA: Diagnosis not present

## 2019-01-29 DIAGNOSIS — G20 Parkinson's disease: Secondary | ICD-10-CM | POA: Diagnosis not present

## 2019-01-29 DIAGNOSIS — R296 Repeated falls: Secondary | ICD-10-CM | POA: Diagnosis not present

## 2019-01-29 DIAGNOSIS — M25519 Pain in unspecified shoulder: Secondary | ICD-10-CM | POA: Diagnosis not present

## 2019-01-29 DIAGNOSIS — M6281 Muscle weakness (generalized): Secondary | ICD-10-CM | POA: Diagnosis not present

## 2019-01-30 DIAGNOSIS — R2689 Other abnormalities of gait and mobility: Secondary | ICD-10-CM | POA: Diagnosis not present

## 2019-01-30 DIAGNOSIS — M25519 Pain in unspecified shoulder: Secondary | ICD-10-CM | POA: Diagnosis not present

## 2019-01-30 DIAGNOSIS — G20 Parkinson's disease: Secondary | ICD-10-CM | POA: Diagnosis not present

## 2019-01-30 DIAGNOSIS — M6281 Muscle weakness (generalized): Secondary | ICD-10-CM | POA: Diagnosis not present

## 2019-01-30 DIAGNOSIS — Z9181 History of falling: Secondary | ICD-10-CM | POA: Diagnosis not present

## 2019-01-30 DIAGNOSIS — R296 Repeated falls: Secondary | ICD-10-CM | POA: Diagnosis not present

## 2019-01-31 DIAGNOSIS — D696 Thrombocytopenia, unspecified: Secondary | ICD-10-CM | POA: Diagnosis not present

## 2019-02-01 DIAGNOSIS — Z9181 History of falling: Secondary | ICD-10-CM | POA: Diagnosis not present

## 2019-02-01 DIAGNOSIS — R2689 Other abnormalities of gait and mobility: Secondary | ICD-10-CM | POA: Diagnosis not present

## 2019-02-01 DIAGNOSIS — R296 Repeated falls: Secondary | ICD-10-CM | POA: Diagnosis not present

## 2019-02-01 DIAGNOSIS — G20 Parkinson's disease: Secondary | ICD-10-CM | POA: Diagnosis not present

## 2019-02-01 DIAGNOSIS — M6281 Muscle weakness (generalized): Secondary | ICD-10-CM | POA: Diagnosis not present

## 2019-02-01 DIAGNOSIS — M25519 Pain in unspecified shoulder: Secondary | ICD-10-CM | POA: Diagnosis not present

## 2019-02-02 DIAGNOSIS — M6281 Muscle weakness (generalized): Secondary | ICD-10-CM | POA: Diagnosis not present

## 2019-02-02 DIAGNOSIS — M25519 Pain in unspecified shoulder: Secondary | ICD-10-CM | POA: Diagnosis not present

## 2019-02-02 DIAGNOSIS — G20 Parkinson's disease: Secondary | ICD-10-CM | POA: Diagnosis not present

## 2019-02-02 DIAGNOSIS — Z7901 Long term (current) use of anticoagulants: Secondary | ICD-10-CM | POA: Diagnosis not present

## 2019-02-02 DIAGNOSIS — R296 Repeated falls: Secondary | ICD-10-CM | POA: Diagnosis not present

## 2019-02-02 DIAGNOSIS — Z9181 History of falling: Secondary | ICD-10-CM | POA: Diagnosis not present

## 2019-02-02 DIAGNOSIS — D696 Thrombocytopenia, unspecified: Secondary | ICD-10-CM | POA: Diagnosis not present

## 2019-02-02 DIAGNOSIS — R2689 Other abnormalities of gait and mobility: Secondary | ICD-10-CM | POA: Diagnosis not present

## 2019-02-03 DIAGNOSIS — M25519 Pain in unspecified shoulder: Secondary | ICD-10-CM | POA: Diagnosis not present

## 2019-02-03 DIAGNOSIS — R296 Repeated falls: Secondary | ICD-10-CM | POA: Diagnosis not present

## 2019-02-03 DIAGNOSIS — Z9181 History of falling: Secondary | ICD-10-CM | POA: Diagnosis not present

## 2019-02-03 DIAGNOSIS — M6281 Muscle weakness (generalized): Secondary | ICD-10-CM | POA: Diagnosis not present

## 2019-02-03 DIAGNOSIS — R2689 Other abnormalities of gait and mobility: Secondary | ICD-10-CM | POA: Diagnosis not present

## 2019-02-03 DIAGNOSIS — G20 Parkinson's disease: Secondary | ICD-10-CM | POA: Diagnosis not present

## 2019-02-04 DIAGNOSIS — G20 Parkinson's disease: Secondary | ICD-10-CM | POA: Diagnosis not present

## 2019-02-04 DIAGNOSIS — M25519 Pain in unspecified shoulder: Secondary | ICD-10-CM | POA: Diagnosis not present

## 2019-02-04 DIAGNOSIS — Z9181 History of falling: Secondary | ICD-10-CM | POA: Diagnosis not present

## 2019-02-04 DIAGNOSIS — M6281 Muscle weakness (generalized): Secondary | ICD-10-CM | POA: Diagnosis not present

## 2019-02-04 DIAGNOSIS — R2689 Other abnormalities of gait and mobility: Secondary | ICD-10-CM | POA: Diagnosis not present

## 2019-02-04 DIAGNOSIS — R296 Repeated falls: Secondary | ICD-10-CM | POA: Diagnosis not present

## 2019-02-06 DIAGNOSIS — M25519 Pain in unspecified shoulder: Secondary | ICD-10-CM | POA: Diagnosis not present

## 2019-02-06 DIAGNOSIS — Z9181 History of falling: Secondary | ICD-10-CM | POA: Diagnosis not present

## 2019-02-06 DIAGNOSIS — G20 Parkinson's disease: Secondary | ICD-10-CM | POA: Diagnosis not present

## 2019-02-06 DIAGNOSIS — R2689 Other abnormalities of gait and mobility: Secondary | ICD-10-CM | POA: Diagnosis not present

## 2019-02-06 DIAGNOSIS — M6281 Muscle weakness (generalized): Secondary | ICD-10-CM | POA: Diagnosis not present

## 2019-02-06 DIAGNOSIS — R296 Repeated falls: Secondary | ICD-10-CM | POA: Diagnosis not present

## 2019-02-07 DIAGNOSIS — R296 Repeated falls: Secondary | ICD-10-CM | POA: Diagnosis not present

## 2019-02-07 DIAGNOSIS — M6281 Muscle weakness (generalized): Secondary | ICD-10-CM | POA: Diagnosis not present

## 2019-02-07 DIAGNOSIS — G20 Parkinson's disease: Secondary | ICD-10-CM | POA: Diagnosis not present

## 2019-02-07 DIAGNOSIS — R2689 Other abnormalities of gait and mobility: Secondary | ICD-10-CM | POA: Diagnosis not present

## 2019-02-07 DIAGNOSIS — Z9181 History of falling: Secondary | ICD-10-CM | POA: Diagnosis not present

## 2019-02-07 DIAGNOSIS — M25519 Pain in unspecified shoulder: Secondary | ICD-10-CM | POA: Diagnosis not present

## 2019-02-08 DIAGNOSIS — M6281 Muscle weakness (generalized): Secondary | ICD-10-CM | POA: Diagnosis not present

## 2019-02-08 DIAGNOSIS — Z9181 History of falling: Secondary | ICD-10-CM | POA: Diagnosis not present

## 2019-02-08 DIAGNOSIS — G20 Parkinson's disease: Secondary | ICD-10-CM | POA: Diagnosis not present

## 2019-02-08 DIAGNOSIS — R296 Repeated falls: Secondary | ICD-10-CM | POA: Diagnosis not present

## 2019-02-08 DIAGNOSIS — R2689 Other abnormalities of gait and mobility: Secondary | ICD-10-CM | POA: Diagnosis not present

## 2019-02-08 DIAGNOSIS — M25519 Pain in unspecified shoulder: Secondary | ICD-10-CM | POA: Diagnosis not present

## 2019-02-09 DIAGNOSIS — M25519 Pain in unspecified shoulder: Secondary | ICD-10-CM | POA: Diagnosis not present

## 2019-02-09 DIAGNOSIS — R296 Repeated falls: Secondary | ICD-10-CM | POA: Diagnosis not present

## 2019-02-09 DIAGNOSIS — G20 Parkinson's disease: Secondary | ICD-10-CM | POA: Diagnosis not present

## 2019-02-09 DIAGNOSIS — Z9181 History of falling: Secondary | ICD-10-CM | POA: Diagnosis not present

## 2019-02-09 DIAGNOSIS — M6281 Muscle weakness (generalized): Secondary | ICD-10-CM | POA: Diagnosis not present

## 2019-02-09 DIAGNOSIS — R2689 Other abnormalities of gait and mobility: Secondary | ICD-10-CM | POA: Diagnosis not present

## 2019-02-10 DIAGNOSIS — R2689 Other abnormalities of gait and mobility: Secondary | ICD-10-CM | POA: Diagnosis not present

## 2019-02-10 DIAGNOSIS — E039 Hypothyroidism, unspecified: Secondary | ICD-10-CM | POA: Diagnosis not present

## 2019-02-10 DIAGNOSIS — E785 Hyperlipidemia, unspecified: Secondary | ICD-10-CM | POA: Diagnosis not present

## 2019-02-10 DIAGNOSIS — D696 Thrombocytopenia, unspecified: Secondary | ICD-10-CM | POA: Diagnosis not present

## 2019-02-10 DIAGNOSIS — M6281 Muscle weakness (generalized): Secondary | ICD-10-CM | POA: Diagnosis not present

## 2019-02-10 DIAGNOSIS — M25519 Pain in unspecified shoulder: Secondary | ICD-10-CM | POA: Diagnosis not present

## 2019-02-10 DIAGNOSIS — Z9181 History of falling: Secondary | ICD-10-CM | POA: Diagnosis not present

## 2019-02-10 DIAGNOSIS — G20 Parkinson's disease: Secondary | ICD-10-CM | POA: Diagnosis not present

## 2019-02-10 DIAGNOSIS — I10 Essential (primary) hypertension: Secondary | ICD-10-CM | POA: Diagnosis not present

## 2019-02-10 DIAGNOSIS — R296 Repeated falls: Secondary | ICD-10-CM | POA: Diagnosis not present

## 2019-02-12 DIAGNOSIS — M6281 Muscle weakness (generalized): Secondary | ICD-10-CM | POA: Diagnosis not present

## 2019-02-12 DIAGNOSIS — G20 Parkinson's disease: Secondary | ICD-10-CM | POA: Diagnosis not present

## 2019-02-12 DIAGNOSIS — M25519 Pain in unspecified shoulder: Secondary | ICD-10-CM | POA: Diagnosis not present

## 2019-02-12 DIAGNOSIS — Z9181 History of falling: Secondary | ICD-10-CM | POA: Diagnosis not present

## 2019-02-12 DIAGNOSIS — R2689 Other abnormalities of gait and mobility: Secondary | ICD-10-CM | POA: Diagnosis not present

## 2019-02-12 DIAGNOSIS — R296 Repeated falls: Secondary | ICD-10-CM | POA: Diagnosis not present

## 2019-02-13 DIAGNOSIS — G20 Parkinson's disease: Secondary | ICD-10-CM | POA: Diagnosis not present

## 2019-02-13 DIAGNOSIS — Z9181 History of falling: Secondary | ICD-10-CM | POA: Diagnosis not present

## 2019-02-13 DIAGNOSIS — R2689 Other abnormalities of gait and mobility: Secondary | ICD-10-CM | POA: Diagnosis not present

## 2019-02-13 DIAGNOSIS — R296 Repeated falls: Secondary | ICD-10-CM | POA: Diagnosis not present

## 2019-02-13 DIAGNOSIS — M6281 Muscle weakness (generalized): Secondary | ICD-10-CM | POA: Diagnosis not present

## 2019-02-13 DIAGNOSIS — M25519 Pain in unspecified shoulder: Secondary | ICD-10-CM | POA: Diagnosis not present

## 2019-02-14 DIAGNOSIS — M6281 Muscle weakness (generalized): Secondary | ICD-10-CM | POA: Diagnosis not present

## 2019-02-14 DIAGNOSIS — G20 Parkinson's disease: Secondary | ICD-10-CM | POA: Diagnosis not present

## 2019-02-14 DIAGNOSIS — R296 Repeated falls: Secondary | ICD-10-CM | POA: Diagnosis not present

## 2019-02-14 DIAGNOSIS — M25519 Pain in unspecified shoulder: Secondary | ICD-10-CM | POA: Diagnosis not present

## 2019-02-14 DIAGNOSIS — Z9181 History of falling: Secondary | ICD-10-CM | POA: Diagnosis not present

## 2019-02-14 DIAGNOSIS — R2689 Other abnormalities of gait and mobility: Secondary | ICD-10-CM | POA: Diagnosis not present

## 2019-02-15 DIAGNOSIS — R296 Repeated falls: Secondary | ICD-10-CM | POA: Diagnosis not present

## 2019-02-15 DIAGNOSIS — R2689 Other abnormalities of gait and mobility: Secondary | ICD-10-CM | POA: Diagnosis not present

## 2019-02-15 DIAGNOSIS — Z9181 History of falling: Secondary | ICD-10-CM | POA: Diagnosis not present

## 2019-02-15 DIAGNOSIS — M25519 Pain in unspecified shoulder: Secondary | ICD-10-CM | POA: Diagnosis not present

## 2019-02-15 DIAGNOSIS — G20 Parkinson's disease: Secondary | ICD-10-CM | POA: Diagnosis not present

## 2019-02-15 DIAGNOSIS — M6281 Muscle weakness (generalized): Secondary | ICD-10-CM | POA: Diagnosis not present

## 2019-02-16 DIAGNOSIS — M25519 Pain in unspecified shoulder: Secondary | ICD-10-CM | POA: Diagnosis not present

## 2019-02-16 DIAGNOSIS — R2689 Other abnormalities of gait and mobility: Secondary | ICD-10-CM | POA: Diagnosis not present

## 2019-02-16 DIAGNOSIS — R296 Repeated falls: Secondary | ICD-10-CM | POA: Diagnosis not present

## 2019-02-16 DIAGNOSIS — G20 Parkinson's disease: Secondary | ICD-10-CM | POA: Diagnosis not present

## 2019-02-16 DIAGNOSIS — M6281 Muscle weakness (generalized): Secondary | ICD-10-CM | POA: Diagnosis not present

## 2019-02-16 DIAGNOSIS — Z9181 History of falling: Secondary | ICD-10-CM | POA: Diagnosis not present

## 2019-02-17 DIAGNOSIS — M25519 Pain in unspecified shoulder: Secondary | ICD-10-CM | POA: Diagnosis not present

## 2019-02-17 DIAGNOSIS — G20 Parkinson's disease: Secondary | ICD-10-CM | POA: Diagnosis not present

## 2019-02-17 DIAGNOSIS — M6281 Muscle weakness (generalized): Secondary | ICD-10-CM | POA: Diagnosis not present

## 2019-02-17 DIAGNOSIS — Z9181 History of falling: Secondary | ICD-10-CM | POA: Diagnosis not present

## 2019-02-17 DIAGNOSIS — R296 Repeated falls: Secondary | ICD-10-CM | POA: Diagnosis not present

## 2019-02-17 DIAGNOSIS — R2689 Other abnormalities of gait and mobility: Secondary | ICD-10-CM | POA: Diagnosis not present

## 2019-02-18 DIAGNOSIS — M25552 Pain in left hip: Secondary | ICD-10-CM | POA: Diagnosis not present

## 2019-02-19 DIAGNOSIS — R2689 Other abnormalities of gait and mobility: Secondary | ICD-10-CM | POA: Diagnosis not present

## 2019-02-19 DIAGNOSIS — R296 Repeated falls: Secondary | ICD-10-CM | POA: Diagnosis not present

## 2019-02-19 DIAGNOSIS — G20 Parkinson's disease: Secondary | ICD-10-CM | POA: Diagnosis not present

## 2019-02-19 DIAGNOSIS — M25519 Pain in unspecified shoulder: Secondary | ICD-10-CM | POA: Diagnosis not present

## 2019-02-19 DIAGNOSIS — M6281 Muscle weakness (generalized): Secondary | ICD-10-CM | POA: Diagnosis not present

## 2019-02-19 DIAGNOSIS — Z9181 History of falling: Secondary | ICD-10-CM | POA: Diagnosis not present

## 2019-02-20 DIAGNOSIS — G20 Parkinson's disease: Secondary | ICD-10-CM | POA: Diagnosis not present

## 2019-02-20 DIAGNOSIS — R2689 Other abnormalities of gait and mobility: Secondary | ICD-10-CM | POA: Diagnosis not present

## 2019-02-20 DIAGNOSIS — M25519 Pain in unspecified shoulder: Secondary | ICD-10-CM | POA: Diagnosis not present

## 2019-02-20 DIAGNOSIS — R296 Repeated falls: Secondary | ICD-10-CM | POA: Diagnosis not present

## 2019-02-20 DIAGNOSIS — M6281 Muscle weakness (generalized): Secondary | ICD-10-CM | POA: Diagnosis not present

## 2019-02-20 DIAGNOSIS — Z9181 History of falling: Secondary | ICD-10-CM | POA: Diagnosis not present

## 2019-02-21 DIAGNOSIS — M6281 Muscle weakness (generalized): Secondary | ICD-10-CM | POA: Diagnosis not present

## 2019-02-21 DIAGNOSIS — M25519 Pain in unspecified shoulder: Secondary | ICD-10-CM | POA: Diagnosis not present

## 2019-02-21 DIAGNOSIS — Z9181 History of falling: Secondary | ICD-10-CM | POA: Diagnosis not present

## 2019-02-21 DIAGNOSIS — G20 Parkinson's disease: Secondary | ICD-10-CM | POA: Diagnosis not present

## 2019-02-21 DIAGNOSIS — R2689 Other abnormalities of gait and mobility: Secondary | ICD-10-CM | POA: Diagnosis not present

## 2019-02-21 DIAGNOSIS — R296 Repeated falls: Secondary | ICD-10-CM | POA: Diagnosis not present

## 2019-02-22 DIAGNOSIS — R296 Repeated falls: Secondary | ICD-10-CM | POA: Diagnosis not present

## 2019-02-22 DIAGNOSIS — M25519 Pain in unspecified shoulder: Secondary | ICD-10-CM | POA: Diagnosis not present

## 2019-02-22 DIAGNOSIS — R2689 Other abnormalities of gait and mobility: Secondary | ICD-10-CM | POA: Diagnosis not present

## 2019-02-22 DIAGNOSIS — Z9181 History of falling: Secondary | ICD-10-CM | POA: Diagnosis not present

## 2019-02-22 DIAGNOSIS — M6281 Muscle weakness (generalized): Secondary | ICD-10-CM | POA: Diagnosis not present

## 2019-02-22 DIAGNOSIS — F0390 Unspecified dementia without behavioral disturbance: Secondary | ICD-10-CM | POA: Diagnosis not present

## 2019-02-22 DIAGNOSIS — G4709 Other insomnia: Secondary | ICD-10-CM | POA: Diagnosis not present

## 2019-02-22 DIAGNOSIS — F329 Major depressive disorder, single episode, unspecified: Secondary | ICD-10-CM | POA: Diagnosis not present

## 2019-02-22 DIAGNOSIS — G20 Parkinson's disease: Secondary | ICD-10-CM | POA: Diagnosis not present

## 2019-02-23 DIAGNOSIS — R296 Repeated falls: Secondary | ICD-10-CM | POA: Diagnosis not present

## 2019-02-23 DIAGNOSIS — G20 Parkinson's disease: Secondary | ICD-10-CM | POA: Diagnosis not present

## 2019-02-23 DIAGNOSIS — M6281 Muscle weakness (generalized): Secondary | ICD-10-CM | POA: Diagnosis not present

## 2019-02-23 DIAGNOSIS — Z9181 History of falling: Secondary | ICD-10-CM | POA: Diagnosis not present

## 2019-02-23 DIAGNOSIS — M25519 Pain in unspecified shoulder: Secondary | ICD-10-CM | POA: Diagnosis not present

## 2019-02-23 DIAGNOSIS — R2689 Other abnormalities of gait and mobility: Secondary | ICD-10-CM | POA: Diagnosis not present

## 2019-02-24 DIAGNOSIS — R2689 Other abnormalities of gait and mobility: Secondary | ICD-10-CM | POA: Diagnosis not present

## 2019-02-24 DIAGNOSIS — R296 Repeated falls: Secondary | ICD-10-CM | POA: Diagnosis not present

## 2019-02-24 DIAGNOSIS — Z9181 History of falling: Secondary | ICD-10-CM | POA: Diagnosis not present

## 2019-02-24 DIAGNOSIS — M6281 Muscle weakness (generalized): Secondary | ICD-10-CM | POA: Diagnosis not present

## 2019-02-24 DIAGNOSIS — G20 Parkinson's disease: Secondary | ICD-10-CM | POA: Diagnosis not present

## 2019-02-24 DIAGNOSIS — M25519 Pain in unspecified shoulder: Secondary | ICD-10-CM | POA: Diagnosis not present

## 2019-02-25 DIAGNOSIS — Z9181 History of falling: Secondary | ICD-10-CM | POA: Diagnosis not present

## 2019-02-25 DIAGNOSIS — M25519 Pain in unspecified shoulder: Secondary | ICD-10-CM | POA: Diagnosis not present

## 2019-02-25 DIAGNOSIS — G20 Parkinson's disease: Secondary | ICD-10-CM | POA: Diagnosis not present

## 2019-02-25 DIAGNOSIS — R2689 Other abnormalities of gait and mobility: Secondary | ICD-10-CM | POA: Diagnosis not present

## 2019-02-25 DIAGNOSIS — R296 Repeated falls: Secondary | ICD-10-CM | POA: Diagnosis not present

## 2019-02-25 DIAGNOSIS — M6281 Muscle weakness (generalized): Secondary | ICD-10-CM | POA: Diagnosis not present

## 2019-02-27 DIAGNOSIS — R41841 Cognitive communication deficit: Secondary | ICD-10-CM | POA: Diagnosis not present

## 2019-02-27 DIAGNOSIS — R2689 Other abnormalities of gait and mobility: Secondary | ICD-10-CM | POA: Diagnosis not present

## 2019-02-27 DIAGNOSIS — R488 Other symbolic dysfunctions: Secondary | ICD-10-CM | POA: Diagnosis not present

## 2019-02-27 DIAGNOSIS — R1312 Dysphagia, oropharyngeal phase: Secondary | ICD-10-CM | POA: Diagnosis not present

## 2019-02-27 DIAGNOSIS — Z9181 History of falling: Secondary | ICD-10-CM | POA: Diagnosis not present

## 2019-02-27 DIAGNOSIS — R471 Dysarthria and anarthria: Secondary | ICD-10-CM | POA: Diagnosis not present

## 2019-02-27 DIAGNOSIS — M6281 Muscle weakness (generalized): Secondary | ICD-10-CM | POA: Diagnosis not present

## 2019-02-27 DIAGNOSIS — G20 Parkinson's disease: Secondary | ICD-10-CM | POA: Diagnosis not present

## 2019-02-28 ENCOUNTER — Encounter: Payer: Self-pay | Admitting: Neurology

## 2019-02-28 ENCOUNTER — Ambulatory Visit (INDEPENDENT_AMBULATORY_CARE_PROVIDER_SITE_OTHER): Payer: Medicare Other | Admitting: Neurology

## 2019-02-28 ENCOUNTER — Other Ambulatory Visit: Payer: Self-pay

## 2019-02-28 VITALS — BP 111/50 | HR 60

## 2019-02-28 DIAGNOSIS — R2689 Other abnormalities of gait and mobility: Secondary | ICD-10-CM | POA: Diagnosis not present

## 2019-02-28 DIAGNOSIS — M6281 Muscle weakness (generalized): Secondary | ICD-10-CM | POA: Diagnosis not present

## 2019-02-28 DIAGNOSIS — Z9181 History of falling: Secondary | ICD-10-CM | POA: Diagnosis not present

## 2019-02-28 DIAGNOSIS — R1312 Dysphagia, oropharyngeal phase: Secondary | ICD-10-CM | POA: Diagnosis not present

## 2019-02-28 DIAGNOSIS — G20 Parkinson's disease: Secondary | ICD-10-CM | POA: Diagnosis not present

## 2019-02-28 DIAGNOSIS — R471 Dysarthria and anarthria: Secondary | ICD-10-CM | POA: Diagnosis not present

## 2019-02-28 NOTE — Patient Instructions (Signed)
I would recommend going back to Sinemet 25-100 mg strength half pill 3 times daily.  You are currently taking Sinemet 25-250 mg strength once daily. Your Parkinson's symptoms and examination have remained fairly stable.  I would recommend a follow-up with the nurse practitioner in 6 months.

## 2019-02-28 NOTE — Progress Notes (Signed)
Subjective:    Patient ID: [name] is a 85 y.o. male.  HPI     Interim history:  [name] is an 85 year old right-handed gentleman with an underlying complex medical history of C1 arch fracture and C2 fracture in November 2015 after a fall, atrial fibrillation, scoliosis, spinal stenosis, macular degeneration, vitamin D deficiency, CHF, tachycardia bradycardia syndrome, status post pacemaker placement, status post multiple surgeries including shoulder surgeries, tonsillectomy, hip replacement surgeries, knee surgery, cataract repairs, trigger finger surgery on the right, pacemaker insertion in May 2015, who presents for follow-up consultation of his parkinsonism after a long gap of over 2 years.  The patient is accompanied by a caretaker from his skilled nursing facility today.  I first met him on 09/09/2016, at the request of his primary care physician, at which time he reported a history of tremor on the left side. He had problems with his walking. He had fallen.  He was noted to have signs and symptoms of Parkinson's disease.  He was advised to start a cautious trial of Sinemet, half pill twice daily.    He saw Vaughan Browner, nurse practitioner on 12/09/2016, at which time his Sinemet was increased to half a pill 3 times daily.    Today, 02/28/2019: He reports feeling fairly stable, he [facility] into [facility] about a year ago he estimates.  He had a history of recurrent falls but has not fallen recently, he is mostly wheelchair-bound.  His appetite fluctuates.  His left hand tremor fluctuates, he feels a little weaker on the left side.  He does get to walk with a walker and with some assistance about 3 times out of the week and has therapy.  He takes Sinemet 25-250 mg strength 1 pill once daily in the morning.  I am not sure when this changed and why it was changed from a previously 3 times daily schedule.  He is also on Azilect generic 0.5 mg once daily.  He reports no significant  side effects with the medication, reports no visual or auditory hallucinations.  He tries to hydrate well but sometimes wakes up with a dry mouth. I reviewed the records he brought and the [facility].  The patient's allergies, current medications, family history, past medical history, past social history, past surgical history and problem list were reviewed and updated as appropriate.    Previously:  09/09/2016: (He) has been noted to have a left upper extremity resting tremor and you have also noted facial masking, some signs and symptoms of parkinsonism. I reviewed your office note from 07/15/2016, which you kindly included. He had blood work at your office including CBC with differential which was unremarkable with the exception of borderline MCV at 95.4, CMP was unremarkable with the exception of glucose at 129, TSH was normal, B12 was normal, vitamin D was low normal at 37.8, urinalysis negative.  Of note, he had a head CT without contrast on 03/16/2016 after a fall and I reviewed the results: IMPRESSION: Chronic changes as described, similar to priors. No acute intracranial abnormality. No posttraumatic sequelae are seen in this patient on Eliquis.   In addition, I personally reviewed the images through the PACS system and agree with the results, exvacuo dilatation is noted. Global atrophy is noted.   He had a CT cervical spine without contrast on 06/16/2014 which I reviewed: IMPRESSION:  Fractures involving the C1 anterior arch and the C2 left lateral mass.   He has noted a left arm and hand  tremor for the past year or 2. He has a history of degenerative neck disease and lumbar spinal stenosis, had seen Dr. Nelva Bush for this and had injections to his lower back which worked for the first time and then stop working for the second and third time as I understand. He had seen a spine specialist before and is supposed to see another spine specialist from what I understand. He lives alone in an apartment.  He lives on the third floor and has [employer] an elevator. He walks with a walker, has been using a walker for the past 5 years or so. He is widowed. He has no children. He moved from [place] in 2011. He does not recall any family history of tremor or Parkinson's disease. He has noted some difficulty with walking and fine motor control and has had a long standing issue with balance and has had some falls. He denies any significant memory or mood issues. He sees cardiology regularly. He quit smoking over 40 years ago, drinks alcohol infrequently, maybe once a month or so a glass of wine typically. He does not always drink enough water.   His Past Medical History Is Significant For: Past Medical History:  Diagnosis Date  . Arthritis    incl spinal stenosis  . Atrial fibrillation ([place])   . BPH (benign prostatic hyperplasia)   . CHF (congestive heart failure) ([place])   . CHF (congestive heart failure) ([place])   . Dementia ([place])   . Dysphagia   . HTN (hypertension)   . Hyperlipidemia   . Parkinson disease ([place])   . Renal insufficiency    question degree  . Spinal stenosis   . Thyroid disease   . Wears glasses    reading  . Wears partial dentures    upper partial    His Past Surgical History Is Significant For: Past Surgical History:  Procedure Laterality Date  . bilateral shoulder surgery    . EYE SURGERY     both catararcts  . I&D EXTREMITY  12/07/2011   Procedure: IRRIGATION AND DEBRIDEMENT EXTREMITY;  Surgeon: Tennis Must, MD;  Location: [facility];  Service: Orthopedics;  Laterality: Left;  . PACEMAKER GENERATOR CHANGE N/A 11/28/2013   Procedure: PACEMAKER GENERATOR CHANGE;  Surgeon: Deboraha Sprang, MD;  Location: [facility];  Service: Cardiovascular;  Laterality: N/A;  . PACEMAKER INSERTION     BSX pacemaker generator change 11-28-2013 by Dr Caryl Comes  . prosthetic hip - bilaterally    . R knee quadricep tendon repair    . TONSILLECTOMY    . TRIGGER FINGER RELEASE Right 05/26/2013    Procedure: RIGHT SMALL TRIGGER RELEASE ;  Surgeon: Tennis Must, MD;  Location: [facility];  Service: Orthopedics;  Laterality: Right;    His Family History Is Significant For: No family history on file.  His Social History Is Significant For: Social History   Socioeconomic History  . Marital status: Widowed    Spouse name: Not on file  . Number of children: Not on file  . Years of education: Not on file  . Highest education level: Not on file  Occupational History  . Not on file  Social Needs  . Financial resource strain: Not on file  . Food insecurity    Worry: Not on file    Inability: Not on file  . Transportation needs    Medical: Not on file    Non-medical: Not on file  Tobacco Use  .  Smoking status: Former Smoker    Types: Cigarettes    Quit date: 05/13/1971    Years since quitting: 47.8  . Smokeless tobacco: Never Used  Substance and Sexual Activity  . Alcohol use: No    Frequency: Never    Comment: occasional  . Drug use: No  . Sexual activity: Not on file  Lifestyle  . Physical activity    Days per week: Not on file    Minutes per session: Not on file  . Stress: Not on file  Relationships  . Social Herbalist on phone: Not on file    Gets together: Not on file    Attends religious service: Not on file    Active member of club or organization: Not on file    Attends meetings of clubs or organizations: Not on file    Relationship status: Not on file  Other Topics Concern  . Not on file  Social History Narrative  . Not on file    His Allergies Are:  Allergies  Allergen Reactions  . Amiodarone Other (See Comments)     fluid on lungs  :   His Current Medications Are:  Outpatient Encounter Medications as of 02/28/2019  Medication Sig  . acetaminophen (TYLENOL) 500 MG tablet Take 1,000 mg by mouth 2 (two) times daily. scheduled  . amoxicillin (AMOXIL) 500 MG capsule Take 500 mg by mouth See admin instructions. Take 4  capsules (2000 mg) by mouth prior to dental appointment  . Ascorbic Acid (VITAMIN C) 1000 MG tablet Take 1,000 mg by mouth daily.  . ASPERCREME LIDOCAINE EX Apply topically.  Marland Kitchen b complex vitamins tablet Take 1 tablet by mouth daily.  . carbidopa-levodopa (SINEMET IR) 25-250 MG tablet Take 1 tablet by mouth daily.  . carvedilol (COREG) 3.125 MG tablet Take 1 tablet by mouth 2 (two) times daily.  . Cholecalciferol (VITAMIN D-3) 5000 UNITS TABS Take 5,000 Units by mouth daily.  Marland Kitchen docusate sodium (COLACE) 100 MG capsule Take 100 mg by mouth daily.  . finasteride (PROSCAR) 5 MG tablet Take 5 mg by mouth daily.   . fluticasone (VERAMYST) 27.5 MCG/SPRAY nasal spray Place 2 sprays into the nose daily.  . furosemide (LASIX) 20 MG tablet Take 20 mg by mouth daily.  Marland Kitchen ipratropium-albuterol (DUONEB) 0.5-2.5 (3) MG/3ML SOLN Inhale 1 mL into the lungs every 6 (six) hours as needed for wheezing.  Marland Kitchen levothyroxine (SYNTHROID, LEVOTHROID) 25 MCG tablet Take 1 tablet by mouth daily.  Marland Kitchen lisinopril (PRINIVIL,ZESTRIL) 5 MG tablet Take 1 tablet by mouth daily.  . Mouthwashes (BIOTENE DRY MOUTH MT) Use as directed 10 mLs in the mouth or throat daily as needed (dry mouth).  . Omega-3 Fatty Acids (FISH OIL) 1200 MG CAPS Take 2,400 mg by mouth 2 (two) times daily.  Marland Kitchen oxybutynin (DITROPAN-XL) 5 MG 24 hr tablet Take 5 mg by mouth at bedtime.  . Polyethylene Glycol 3350 (MIRALAX PO) Take 1 Dose by mouth 3 (three) times a week. Takes on M/W/F  . polyvinyl alcohol (LIQUIFILM TEARS) 1.4 % ophthalmic solution Place 1 drop into both eyes 5 (five) times daily as needed for dry eyes.   . pravastatin (PRAVACHOL) 20 MG tablet Take 1 tablet by mouth daily.  . Probiotic Product (PROBIOTIC DAILY PO) Take 1 capsule by mouth daily.  . rasagiline (AZILECT) 0.5 MG TABS tablet Take 1 tablet by mouth daily.  . RESTASIS 0.05 % ophthalmic emulsion Place 1 drop into both eyes 2 (two) times  daily.  . Rivaroxaban (XARELTO) 15 MG TABS tablet Take  1 tablet (15 mg total) by mouth daily with supper.  . senna (SENOKOT) 8.6 MG TABS tablet Take 1 tablet by mouth 2 (two) times daily.  . sertraline (ZOLOFT) 50 MG tablet Take 50 mg by mouth daily.  . Tamsulosin HCl (FLOMAX) 0.4 MG CAPS Take 0.4 mg by mouth 2 (two) times daily.   . traMADol (ULTRAM) 50 MG tablet Take 100 mg by mouth 2 (two) times daily. scheduled  . [DISCONTINUED] doxycycline (VIBRAMYCIN) 100 MG capsule Take 1 capsule by mouth 2 (two) times daily.   No facility-administered encounter medications on file as of 02/28/2019.   :  Review of Systems:  Out of a complete 14 point review of systems, all are reviewed and negative with the exception of these symptoms as listed below: Review of Systems  Neurological:       Pt presents today to follow up on his PD. Pt reports that he has been doing ok, although things have been "dull."    Objective:  Neurological Exam  Physical Exam Physical Examination:   Vitals:   02/28/19 1042  BP: (!) 111/50  Pulse: 60    General Examination: The patient is a very pleasant 85 y.o. male in no acute distress. He appears frail, he is well groomed.   HEENT: Normocephalic, atraumatic, pupils are equal, round and reactive to light. Extraocular tracking is difficult throughout. He has no nystagmus. He has mild facial masking, neck is mildly rigid. Hearing is mildly impaired. He has mild decrease in eye blink rate. Speech is mildly hypophonic, not tremulous, no lip, neck or jaw tremor is noted. Oropharynx exam reveals mild mouth dryness, adequate dental hygiene, no significant airway crowding.   Chest: is clear to auscultation without wheezing, rhonchi or crackles noted.  Heart: sounds are somewhat irregular.  Abdomen: is soft, non-tender and non-distended with normal bowel sounds appreciated on auscultation.  Extremities: There is trace edema in the distal lower extremities bilaterally.   Skin: is warm and dry with no trophic changes noted.  Age-related changes are noted on the skin.   Musculoskeletal: exam reveals no obvious joint deformities, tenderness, joint swelling or erythema.  Neurologically:  Mental status: The patient is awake and alert, paying good  attention. He is able to completely provide the history. His memory, attention, language and knowledge are appropriate. He is oriented to self, date, place, circumstance.   Cranial nerves are as described above under HEENT exam. In addition, shoulder shrug is normal with equal shoulder height noted.  Motor exam: Thin bulk, global strength of 4 out of 5. Reflexes are Trace to 1+ in the upper extremities, diminished in the LEs. He has a mild resting tremor in the left upper extremity.  He has no other postural or action tremor or resting tremor.  Fine motor skills are overall globally moderately impaired.  Romberg is not testable safely.  Cerebellar testing shows no dysmetria or intention tremor.  I did not have him stand or walk today, he is in a wheelchair.  Sensory exam is intact to light touch.  Assessment and Plan:   In summary, [name] is a very pleasant 85 year old male with an underlying complex medical history of C1 arch fracture and C2 fracture in November 2015 after a fall, atrial fibrillation, scoliosis, spinal stenosis, macular degeneration, vitamin D deficiency, CHF, tachycardia bradycardia syndrome, status post pacemaker placement, status post multiple surgeries including shoulder surgeries, tonsillectomy, hip replacement surgeries, knee  surgery, cataract repairs, trigger finger surgery on the right, pacemaker insertion in May 2015, who presents for Follow-up consultation of his Parkinson's disease after a longer gap of over 2 years.  He was on Sinemet 25-100 mg strength half a pill 3 times daily.  He has been on Sinemet 25-250 mg strength 1 pill every morning.  Not sure when this changed.  He is also on Azilect generic 0.5 mg once daily in the morning.  He  can continue with the Azilect and is advised to talk to his primary care physician about potentially changing back the Sinemet 3 times daily scheduled.  Half a pill 3 times a day of the 25-100 mg strength for now, perhaps an increase with time to 1 pill 3 times daily if tolerated.  It is somewhat unusual and unconventional to use Sinemet once daily especially the immediate release formulation.On the reassuring side, he has not had much in the way of progression of his Parkinson's signs and symptoms from what I can tell.  He has been in skilled nursing for the past year.  He is advised to follow-up in about 6 months with 1 of our nurse practitioners.  We talked about the importance of good nutrition, and proper hydration with water.  He is advised not to stand or walk without additional assistance with someone being there.  He has a walker available but may not be completely safe to use the walker by himself.I answered all his questions today and the patient was in agreement. I spent 25 minutes in total face-to-face time with the patient, more than 50% of which was spent in counseling and coordination of care, reviewing test results, reviewing medication and discussing or reviewing the diagnosis of PD, its prognosis and treatment options. Pertinent laboratory and imaging test results that were available during this visit with the patient were reviewed by me and considered in my medical decision making (see chart for details).

## 2019-03-01 DIAGNOSIS — R2689 Other abnormalities of gait and mobility: Secondary | ICD-10-CM | POA: Diagnosis not present

## 2019-03-01 DIAGNOSIS — R471 Dysarthria and anarthria: Secondary | ICD-10-CM | POA: Diagnosis not present

## 2019-03-01 DIAGNOSIS — Z9181 History of falling: Secondary | ICD-10-CM | POA: Diagnosis not present

## 2019-03-01 DIAGNOSIS — R1312 Dysphagia, oropharyngeal phase: Secondary | ICD-10-CM | POA: Diagnosis not present

## 2019-03-01 DIAGNOSIS — M6281 Muscle weakness (generalized): Secondary | ICD-10-CM | POA: Diagnosis not present

## 2019-03-01 DIAGNOSIS — G20 Parkinson's disease: Secondary | ICD-10-CM | POA: Diagnosis not present

## 2019-03-02 DIAGNOSIS — G20 Parkinson's disease: Secondary | ICD-10-CM | POA: Diagnosis not present

## 2019-03-02 DIAGNOSIS — M6281 Muscle weakness (generalized): Secondary | ICD-10-CM | POA: Diagnosis not present

## 2019-03-02 DIAGNOSIS — Z9181 History of falling: Secondary | ICD-10-CM | POA: Diagnosis not present

## 2019-03-02 DIAGNOSIS — R471 Dysarthria and anarthria: Secondary | ICD-10-CM | POA: Diagnosis not present

## 2019-03-02 DIAGNOSIS — R1312 Dysphagia, oropharyngeal phase: Secondary | ICD-10-CM | POA: Diagnosis not present

## 2019-03-02 DIAGNOSIS — R2689 Other abnormalities of gait and mobility: Secondary | ICD-10-CM | POA: Diagnosis not present

## 2019-03-03 DIAGNOSIS — R2689 Other abnormalities of gait and mobility: Secondary | ICD-10-CM | POA: Diagnosis not present

## 2019-03-03 DIAGNOSIS — M6281 Muscle weakness (generalized): Secondary | ICD-10-CM | POA: Diagnosis not present

## 2019-03-03 DIAGNOSIS — G20 Parkinson's disease: Secondary | ICD-10-CM | POA: Diagnosis not present

## 2019-03-03 DIAGNOSIS — Z9181 History of falling: Secondary | ICD-10-CM | POA: Diagnosis not present

## 2019-03-03 DIAGNOSIS — R1312 Dysphagia, oropharyngeal phase: Secondary | ICD-10-CM | POA: Diagnosis not present

## 2019-03-03 DIAGNOSIS — R471 Dysarthria and anarthria: Secondary | ICD-10-CM | POA: Diagnosis not present

## 2019-03-04 DIAGNOSIS — Z9181 History of falling: Secondary | ICD-10-CM | POA: Diagnosis not present

## 2019-03-04 DIAGNOSIS — R471 Dysarthria and anarthria: Secondary | ICD-10-CM | POA: Diagnosis not present

## 2019-03-04 DIAGNOSIS — G20 Parkinson's disease: Secondary | ICD-10-CM | POA: Diagnosis not present

## 2019-03-04 DIAGNOSIS — M6281 Muscle weakness (generalized): Secondary | ICD-10-CM | POA: Diagnosis not present

## 2019-03-04 DIAGNOSIS — R1312 Dysphagia, oropharyngeal phase: Secondary | ICD-10-CM | POA: Diagnosis not present

## 2019-03-04 DIAGNOSIS — R2689 Other abnormalities of gait and mobility: Secondary | ICD-10-CM | POA: Diagnosis not present

## 2019-03-05 DIAGNOSIS — G20 Parkinson's disease: Secondary | ICD-10-CM | POA: Diagnosis not present

## 2019-03-05 DIAGNOSIS — R1312 Dysphagia, oropharyngeal phase: Secondary | ICD-10-CM | POA: Diagnosis not present

## 2019-03-05 DIAGNOSIS — R2689 Other abnormalities of gait and mobility: Secondary | ICD-10-CM | POA: Diagnosis not present

## 2019-03-05 DIAGNOSIS — R471 Dysarthria and anarthria: Secondary | ICD-10-CM | POA: Diagnosis not present

## 2019-03-05 DIAGNOSIS — Z9181 History of falling: Secondary | ICD-10-CM | POA: Diagnosis not present

## 2019-03-05 DIAGNOSIS — M6281 Muscle weakness (generalized): Secondary | ICD-10-CM | POA: Diagnosis not present

## 2019-03-06 DIAGNOSIS — R1312 Dysphagia, oropharyngeal phase: Secondary | ICD-10-CM | POA: Diagnosis not present

## 2019-03-06 DIAGNOSIS — G20 Parkinson's disease: Secondary | ICD-10-CM | POA: Diagnosis not present

## 2019-03-06 DIAGNOSIS — M6281 Muscle weakness (generalized): Secondary | ICD-10-CM | POA: Diagnosis not present

## 2019-03-06 DIAGNOSIS — R471 Dysarthria and anarthria: Secondary | ICD-10-CM | POA: Diagnosis not present

## 2019-03-06 DIAGNOSIS — R2689 Other abnormalities of gait and mobility: Secondary | ICD-10-CM | POA: Diagnosis not present

## 2019-03-06 DIAGNOSIS — Z9181 History of falling: Secondary | ICD-10-CM | POA: Diagnosis not present

## 2019-03-08 DIAGNOSIS — R2689 Other abnormalities of gait and mobility: Secondary | ICD-10-CM | POA: Diagnosis not present

## 2019-03-08 DIAGNOSIS — M6281 Muscle weakness (generalized): Secondary | ICD-10-CM | POA: Diagnosis not present

## 2019-03-08 DIAGNOSIS — Z9181 History of falling: Secondary | ICD-10-CM | POA: Diagnosis not present

## 2019-03-08 DIAGNOSIS — R471 Dysarthria and anarthria: Secondary | ICD-10-CM | POA: Diagnosis not present

## 2019-03-08 DIAGNOSIS — R1312 Dysphagia, oropharyngeal phase: Secondary | ICD-10-CM | POA: Diagnosis not present

## 2019-03-08 DIAGNOSIS — G20 Parkinson's disease: Secondary | ICD-10-CM | POA: Diagnosis not present

## 2019-03-09 DIAGNOSIS — R2689 Other abnormalities of gait and mobility: Secondary | ICD-10-CM | POA: Diagnosis not present

## 2019-03-09 DIAGNOSIS — Z9181 History of falling: Secondary | ICD-10-CM | POA: Diagnosis not present

## 2019-03-09 DIAGNOSIS — G20 Parkinson's disease: Secondary | ICD-10-CM | POA: Diagnosis not present

## 2019-03-09 DIAGNOSIS — R1312 Dysphagia, oropharyngeal phase: Secondary | ICD-10-CM | POA: Diagnosis not present

## 2019-03-09 DIAGNOSIS — R471 Dysarthria and anarthria: Secondary | ICD-10-CM | POA: Diagnosis not present

## 2019-03-09 DIAGNOSIS — M6281 Muscle weakness (generalized): Secondary | ICD-10-CM | POA: Diagnosis not present

## 2019-03-10 DIAGNOSIS — G20 Parkinson's disease: Secondary | ICD-10-CM | POA: Diagnosis not present

## 2019-03-10 DIAGNOSIS — R471 Dysarthria and anarthria: Secondary | ICD-10-CM | POA: Diagnosis not present

## 2019-03-10 DIAGNOSIS — R2689 Other abnormalities of gait and mobility: Secondary | ICD-10-CM | POA: Diagnosis not present

## 2019-03-10 DIAGNOSIS — Z9181 History of falling: Secondary | ICD-10-CM | POA: Diagnosis not present

## 2019-03-10 DIAGNOSIS — M6281 Muscle weakness (generalized): Secondary | ICD-10-CM | POA: Diagnosis not present

## 2019-03-10 DIAGNOSIS — R1312 Dysphagia, oropharyngeal phase: Secondary | ICD-10-CM | POA: Diagnosis not present

## 2019-03-11 DIAGNOSIS — G20 Parkinson's disease: Secondary | ICD-10-CM | POA: Diagnosis not present

## 2019-03-11 DIAGNOSIS — R1312 Dysphagia, oropharyngeal phase: Secondary | ICD-10-CM | POA: Diagnosis not present

## 2019-03-11 DIAGNOSIS — Z9181 History of falling: Secondary | ICD-10-CM | POA: Diagnosis not present

## 2019-03-11 DIAGNOSIS — R2689 Other abnormalities of gait and mobility: Secondary | ICD-10-CM | POA: Diagnosis not present

## 2019-03-11 DIAGNOSIS — R471 Dysarthria and anarthria: Secondary | ICD-10-CM | POA: Diagnosis not present

## 2019-03-11 DIAGNOSIS — M6281 Muscle weakness (generalized): Secondary | ICD-10-CM | POA: Diagnosis not present

## 2019-03-14 DIAGNOSIS — G20 Parkinson's disease: Secondary | ICD-10-CM | POA: Diagnosis not present

## 2019-03-14 DIAGNOSIS — Z9181 History of falling: Secondary | ICD-10-CM | POA: Diagnosis not present

## 2019-03-14 DIAGNOSIS — M6281 Muscle weakness (generalized): Secondary | ICD-10-CM | POA: Diagnosis not present

## 2019-03-14 DIAGNOSIS — R471 Dysarthria and anarthria: Secondary | ICD-10-CM | POA: Diagnosis not present

## 2019-03-14 DIAGNOSIS — R1312 Dysphagia, oropharyngeal phase: Secondary | ICD-10-CM | POA: Diagnosis not present

## 2019-03-14 DIAGNOSIS — R2689 Other abnormalities of gait and mobility: Secondary | ICD-10-CM | POA: Diagnosis not present

## 2019-03-15 ENCOUNTER — Other Ambulatory Visit: Payer: Self-pay

## 2019-03-15 ENCOUNTER — Encounter: Payer: Medicare Other | Admitting: *Deleted

## 2019-03-15 DIAGNOSIS — G4709 Other insomnia: Secondary | ICD-10-CM | POA: Diagnosis not present

## 2019-03-15 DIAGNOSIS — Z9181 History of falling: Secondary | ICD-10-CM | POA: Diagnosis not present

## 2019-03-15 DIAGNOSIS — R1312 Dysphagia, oropharyngeal phase: Secondary | ICD-10-CM | POA: Diagnosis not present

## 2019-03-15 DIAGNOSIS — M6281 Muscle weakness (generalized): Secondary | ICD-10-CM | POA: Diagnosis not present

## 2019-03-15 DIAGNOSIS — R471 Dysarthria and anarthria: Secondary | ICD-10-CM | POA: Diagnosis not present

## 2019-03-15 DIAGNOSIS — G20 Parkinson's disease: Secondary | ICD-10-CM | POA: Diagnosis not present

## 2019-03-15 DIAGNOSIS — R2689 Other abnormalities of gait and mobility: Secondary | ICD-10-CM | POA: Diagnosis not present

## 2019-03-15 DIAGNOSIS — F332 Major depressive disorder, recurrent severe without psychotic features: Secondary | ICD-10-CM | POA: Diagnosis not present

## 2019-03-16 ENCOUNTER — Telehealth: Payer: Self-pay

## 2019-03-16 DIAGNOSIS — R1312 Dysphagia, oropharyngeal phase: Secondary | ICD-10-CM | POA: Diagnosis not present

## 2019-03-16 DIAGNOSIS — Z9181 History of falling: Secondary | ICD-10-CM | POA: Diagnosis not present

## 2019-03-16 DIAGNOSIS — R2689 Other abnormalities of gait and mobility: Secondary | ICD-10-CM | POA: Diagnosis not present

## 2019-03-16 DIAGNOSIS — M6281 Muscle weakness (generalized): Secondary | ICD-10-CM | POA: Diagnosis not present

## 2019-03-16 DIAGNOSIS — G20 Parkinson's disease: Secondary | ICD-10-CM | POA: Diagnosis not present

## 2019-03-16 DIAGNOSIS — R471 Dysarthria and anarthria: Secondary | ICD-10-CM | POA: Diagnosis not present

## 2019-03-16 NOTE — Telephone Encounter (Signed)
Unable to speak  with patient to remind of missed remote transmission 

## 2019-03-17 DIAGNOSIS — Z9181 History of falling: Secondary | ICD-10-CM | POA: Diagnosis not present

## 2019-03-17 DIAGNOSIS — R2689 Other abnormalities of gait and mobility: Secondary | ICD-10-CM | POA: Diagnosis not present

## 2019-03-17 DIAGNOSIS — R1312 Dysphagia, oropharyngeal phase: Secondary | ICD-10-CM | POA: Diagnosis not present

## 2019-03-17 DIAGNOSIS — M6281 Muscle weakness (generalized): Secondary | ICD-10-CM | POA: Diagnosis not present

## 2019-03-17 DIAGNOSIS — R471 Dysarthria and anarthria: Secondary | ICD-10-CM | POA: Diagnosis not present

## 2019-03-17 DIAGNOSIS — G20 Parkinson's disease: Secondary | ICD-10-CM | POA: Diagnosis not present

## 2019-03-18 DIAGNOSIS — R5383 Other fatigue: Secondary | ICD-10-CM | POA: Diagnosis not present

## 2019-03-18 DIAGNOSIS — R0989 Other specified symptoms and signs involving the circulatory and respiratory systems: Secondary | ICD-10-CM | POA: Diagnosis not present

## 2019-03-18 DIAGNOSIS — R7303 Prediabetes: Secondary | ICD-10-CM | POA: Diagnosis not present

## 2019-03-18 DIAGNOSIS — R471 Dysarthria and anarthria: Secondary | ICD-10-CM | POA: Diagnosis not present

## 2019-03-18 DIAGNOSIS — R1312 Dysphagia, oropharyngeal phase: Secondary | ICD-10-CM | POA: Diagnosis not present

## 2019-03-18 DIAGNOSIS — R2689 Other abnormalities of gait and mobility: Secondary | ICD-10-CM | POA: Diagnosis not present

## 2019-03-18 DIAGNOSIS — R5381 Other malaise: Secondary | ICD-10-CM | POA: Diagnosis not present

## 2019-03-18 DIAGNOSIS — M6281 Muscle weakness (generalized): Secondary | ICD-10-CM | POA: Diagnosis not present

## 2019-03-18 DIAGNOSIS — R6883 Chills (without fever): Secondary | ICD-10-CM | POA: Diagnosis not present

## 2019-03-18 DIAGNOSIS — G20 Parkinson's disease: Secondary | ICD-10-CM | POA: Diagnosis not present

## 2019-03-18 DIAGNOSIS — Z9181 History of falling: Secondary | ICD-10-CM | POA: Diagnosis not present

## 2019-03-20 DIAGNOSIS — R471 Dysarthria and anarthria: Secondary | ICD-10-CM | POA: Diagnosis not present

## 2019-03-20 DIAGNOSIS — R2689 Other abnormalities of gait and mobility: Secondary | ICD-10-CM | POA: Diagnosis not present

## 2019-03-20 DIAGNOSIS — G20 Parkinson's disease: Secondary | ICD-10-CM | POA: Diagnosis not present

## 2019-03-20 DIAGNOSIS — M6281 Muscle weakness (generalized): Secondary | ICD-10-CM | POA: Diagnosis not present

## 2019-03-20 DIAGNOSIS — Z9181 History of falling: Secondary | ICD-10-CM | POA: Diagnosis not present

## 2019-03-20 DIAGNOSIS — R1312 Dysphagia, oropharyngeal phase: Secondary | ICD-10-CM | POA: Diagnosis not present

## 2019-03-21 DIAGNOSIS — R5381 Other malaise: Secondary | ICD-10-CM | POA: Diagnosis not present

## 2019-03-21 DIAGNOSIS — R5383 Other fatigue: Secondary | ICD-10-CM | POA: Diagnosis not present

## 2019-03-21 DIAGNOSIS — R6883 Chills (without fever): Secondary | ICD-10-CM | POA: Diagnosis not present

## 2019-03-22 DIAGNOSIS — G20 Parkinson's disease: Secondary | ICD-10-CM | POA: Diagnosis not present

## 2019-03-22 DIAGNOSIS — R471 Dysarthria and anarthria: Secondary | ICD-10-CM | POA: Diagnosis not present

## 2019-03-22 DIAGNOSIS — R2689 Other abnormalities of gait and mobility: Secondary | ICD-10-CM | POA: Diagnosis not present

## 2019-03-22 DIAGNOSIS — G4709 Other insomnia: Secondary | ICD-10-CM | POA: Diagnosis not present

## 2019-03-22 DIAGNOSIS — F0390 Unspecified dementia without behavioral disturbance: Secondary | ICD-10-CM | POA: Diagnosis not present

## 2019-03-22 DIAGNOSIS — R1312 Dysphagia, oropharyngeal phase: Secondary | ICD-10-CM | POA: Diagnosis not present

## 2019-03-22 DIAGNOSIS — F329 Major depressive disorder, single episode, unspecified: Secondary | ICD-10-CM | POA: Diagnosis not present

## 2019-03-22 DIAGNOSIS — Z9181 History of falling: Secondary | ICD-10-CM | POA: Diagnosis not present

## 2019-03-22 DIAGNOSIS — M6281 Muscle weakness (generalized): Secondary | ICD-10-CM | POA: Diagnosis not present

## 2019-03-23 DIAGNOSIS — M6281 Muscle weakness (generalized): Secondary | ICD-10-CM | POA: Diagnosis not present

## 2019-03-23 DIAGNOSIS — R1312 Dysphagia, oropharyngeal phase: Secondary | ICD-10-CM | POA: Diagnosis not present

## 2019-03-23 DIAGNOSIS — Z9181 History of falling: Secondary | ICD-10-CM | POA: Diagnosis not present

## 2019-03-23 DIAGNOSIS — R471 Dysarthria and anarthria: Secondary | ICD-10-CM | POA: Diagnosis not present

## 2019-03-23 DIAGNOSIS — G20 Parkinson's disease: Secondary | ICD-10-CM | POA: Diagnosis not present

## 2019-03-23 DIAGNOSIS — R2689 Other abnormalities of gait and mobility: Secondary | ICD-10-CM | POA: Diagnosis not present

## 2019-03-24 ENCOUNTER — Encounter: Payer: Self-pay | Admitting: Cardiology

## 2019-03-24 DIAGNOSIS — R2689 Other abnormalities of gait and mobility: Secondary | ICD-10-CM | POA: Diagnosis not present

## 2019-03-24 DIAGNOSIS — R471 Dysarthria and anarthria: Secondary | ICD-10-CM | POA: Diagnosis not present

## 2019-03-24 DIAGNOSIS — G20 Parkinson's disease: Secondary | ICD-10-CM | POA: Diagnosis not present

## 2019-03-24 DIAGNOSIS — Z9181 History of falling: Secondary | ICD-10-CM | POA: Diagnosis not present

## 2019-03-24 DIAGNOSIS — M6281 Muscle weakness (generalized): Secondary | ICD-10-CM | POA: Diagnosis not present

## 2019-03-24 DIAGNOSIS — R1312 Dysphagia, oropharyngeal phase: Secondary | ICD-10-CM | POA: Diagnosis not present

## 2019-03-26 DIAGNOSIS — M6281 Muscle weakness (generalized): Secondary | ICD-10-CM | POA: Diagnosis not present

## 2019-03-26 DIAGNOSIS — Z9181 History of falling: Secondary | ICD-10-CM | POA: Diagnosis not present

## 2019-03-26 DIAGNOSIS — R471 Dysarthria and anarthria: Secondary | ICD-10-CM | POA: Diagnosis not present

## 2019-03-26 DIAGNOSIS — R2689 Other abnormalities of gait and mobility: Secondary | ICD-10-CM | POA: Diagnosis not present

## 2019-03-26 DIAGNOSIS — R1312 Dysphagia, oropharyngeal phase: Secondary | ICD-10-CM | POA: Diagnosis not present

## 2019-03-26 DIAGNOSIS — G20 Parkinson's disease: Secondary | ICD-10-CM | POA: Diagnosis not present

## 2019-03-28 DIAGNOSIS — Z9181 History of falling: Secondary | ICD-10-CM | POA: Diagnosis not present

## 2019-03-28 DIAGNOSIS — G20 Parkinson's disease: Secondary | ICD-10-CM | POA: Diagnosis not present

## 2019-03-28 DIAGNOSIS — M6281 Muscle weakness (generalized): Secondary | ICD-10-CM | POA: Diagnosis not present

## 2019-03-28 DIAGNOSIS — R471 Dysarthria and anarthria: Secondary | ICD-10-CM | POA: Diagnosis not present

## 2019-03-28 DIAGNOSIS — R2689 Other abnormalities of gait and mobility: Secondary | ICD-10-CM | POA: Diagnosis not present

## 2019-03-28 DIAGNOSIS — R1312 Dysphagia, oropharyngeal phase: Secondary | ICD-10-CM | POA: Diagnosis not present

## 2019-03-29 DIAGNOSIS — K5900 Constipation, unspecified: Secondary | ICD-10-CM | POA: Diagnosis not present

## 2019-03-29 DIAGNOSIS — R471 Dysarthria and anarthria: Secondary | ICD-10-CM | POA: Diagnosis not present

## 2019-03-29 DIAGNOSIS — R1312 Dysphagia, oropharyngeal phase: Secondary | ICD-10-CM | POA: Diagnosis not present

## 2019-03-29 DIAGNOSIS — R293 Abnormal posture: Secondary | ICD-10-CM | POA: Diagnosis not present

## 2019-03-29 DIAGNOSIS — R41841 Cognitive communication deficit: Secondary | ICD-10-CM | POA: Diagnosis not present

## 2019-03-29 DIAGNOSIS — M25519 Pain in unspecified shoulder: Secondary | ICD-10-CM | POA: Diagnosis not present

## 2019-03-29 DIAGNOSIS — M6281 Muscle weakness (generalized): Secondary | ICD-10-CM | POA: Diagnosis not present

## 2019-03-29 DIAGNOSIS — G20 Parkinson's disease: Secondary | ICD-10-CM | POA: Diagnosis not present

## 2019-03-29 DIAGNOSIS — Z9181 History of falling: Secondary | ICD-10-CM | POA: Diagnosis not present

## 2019-03-29 DIAGNOSIS — R2689 Other abnormalities of gait and mobility: Secondary | ICD-10-CM | POA: Diagnosis not present

## 2019-03-29 DIAGNOSIS — E785 Hyperlipidemia, unspecified: Secondary | ICD-10-CM | POA: Diagnosis not present

## 2019-03-29 DIAGNOSIS — E039 Hypothyroidism, unspecified: Secondary | ICD-10-CM | POA: Diagnosis not present

## 2019-03-29 DIAGNOSIS — R488 Other symbolic dysfunctions: Secondary | ICD-10-CM | POA: Diagnosis not present

## 2019-03-29 DIAGNOSIS — I110 Hypertensive heart disease with heart failure: Secondary | ICD-10-CM | POA: Diagnosis not present

## 2019-03-30 DIAGNOSIS — R1312 Dysphagia, oropharyngeal phase: Secondary | ICD-10-CM | POA: Diagnosis not present

## 2019-03-30 DIAGNOSIS — Z9181 History of falling: Secondary | ICD-10-CM | POA: Diagnosis not present

## 2019-03-30 DIAGNOSIS — R41841 Cognitive communication deficit: Secondary | ICD-10-CM | POA: Diagnosis not present

## 2019-03-30 DIAGNOSIS — R488 Other symbolic dysfunctions: Secondary | ICD-10-CM | POA: Diagnosis not present

## 2019-03-30 DIAGNOSIS — R471 Dysarthria and anarthria: Secondary | ICD-10-CM | POA: Diagnosis not present

## 2019-03-30 DIAGNOSIS — G20 Parkinson's disease: Secondary | ICD-10-CM | POA: Diagnosis not present

## 2019-03-31 ENCOUNTER — Ambulatory Visit (INDEPENDENT_AMBULATORY_CARE_PROVIDER_SITE_OTHER): Payer: Medicare Other | Admitting: *Deleted

## 2019-03-31 DIAGNOSIS — R488 Other symbolic dysfunctions: Secondary | ICD-10-CM | POA: Diagnosis not present

## 2019-03-31 DIAGNOSIS — R1312 Dysphagia, oropharyngeal phase: Secondary | ICD-10-CM | POA: Diagnosis not present

## 2019-03-31 DIAGNOSIS — R471 Dysarthria and anarthria: Secondary | ICD-10-CM | POA: Diagnosis not present

## 2019-03-31 DIAGNOSIS — G20 Parkinson's disease: Secondary | ICD-10-CM | POA: Diagnosis not present

## 2019-03-31 DIAGNOSIS — I495 Sick sinus syndrome: Secondary | ICD-10-CM | POA: Diagnosis not present

## 2019-03-31 DIAGNOSIS — R41841 Cognitive communication deficit: Secondary | ICD-10-CM | POA: Diagnosis not present

## 2019-03-31 DIAGNOSIS — Z9181 History of falling: Secondary | ICD-10-CM | POA: Diagnosis not present

## 2019-04-01 ENCOUNTER — Other Ambulatory Visit: Payer: Self-pay

## 2019-04-01 DIAGNOSIS — R488 Other symbolic dysfunctions: Secondary | ICD-10-CM | POA: Diagnosis not present

## 2019-04-01 DIAGNOSIS — G20 Parkinson's disease: Secondary | ICD-10-CM | POA: Diagnosis not present

## 2019-04-01 DIAGNOSIS — R1312 Dysphagia, oropharyngeal phase: Secondary | ICD-10-CM | POA: Diagnosis not present

## 2019-04-01 DIAGNOSIS — R471 Dysarthria and anarthria: Secondary | ICD-10-CM | POA: Diagnosis not present

## 2019-04-01 DIAGNOSIS — R41841 Cognitive communication deficit: Secondary | ICD-10-CM | POA: Diagnosis not present

## 2019-04-01 DIAGNOSIS — Z9181 History of falling: Secondary | ICD-10-CM | POA: Diagnosis not present

## 2019-04-01 LAB — CUP PACEART REMOTE DEVICE CHECK
Battery Remaining Longevity: 66 mo
Battery Remaining Percentage: 80 %
Brady Statistic RV Percent Paced: 84 %
Date Time Interrogation Session: 20200903180700
Implantable Lead Implant Date: 20000401
Implantable Lead Location: 753860
Implantable Lead Model: 4245
Implantable Lead Serial Number: 402472
Implantable Pulse Generator Implant Date: 20150505
Lead Channel Impedance Value: 457 Ohm
Lead Channel Pacing Threshold Amplitude: 0.7 V
Lead Channel Pacing Threshold Pulse Width: 0.4 ms
Lead Channel Setting Pacing Amplitude: 1.2 V
Lead Channel Setting Pacing Pulse Width: 0.4 ms
Lead Channel Setting Sensing Sensitivity: 2.5 mV
Pulse Gen Serial Number: 390662

## 2019-04-02 DIAGNOSIS — R41841 Cognitive communication deficit: Secondary | ICD-10-CM | POA: Diagnosis not present

## 2019-04-02 DIAGNOSIS — R488 Other symbolic dysfunctions: Secondary | ICD-10-CM | POA: Diagnosis not present

## 2019-04-02 DIAGNOSIS — G20 Parkinson's disease: Secondary | ICD-10-CM | POA: Diagnosis not present

## 2019-04-02 DIAGNOSIS — Z9181 History of falling: Secondary | ICD-10-CM | POA: Diagnosis not present

## 2019-04-02 DIAGNOSIS — R471 Dysarthria and anarthria: Secondary | ICD-10-CM | POA: Diagnosis not present

## 2019-04-02 DIAGNOSIS — R1312 Dysphagia, oropharyngeal phase: Secondary | ICD-10-CM | POA: Diagnosis not present

## 2019-04-03 DIAGNOSIS — R1312 Dysphagia, oropharyngeal phase: Secondary | ICD-10-CM | POA: Diagnosis not present

## 2019-04-03 DIAGNOSIS — R41841 Cognitive communication deficit: Secondary | ICD-10-CM | POA: Diagnosis not present

## 2019-04-03 DIAGNOSIS — Z9181 History of falling: Secondary | ICD-10-CM | POA: Diagnosis not present

## 2019-04-03 DIAGNOSIS — G20 Parkinson's disease: Secondary | ICD-10-CM | POA: Diagnosis not present

## 2019-04-03 DIAGNOSIS — R488 Other symbolic dysfunctions: Secondary | ICD-10-CM | POA: Diagnosis not present

## 2019-04-03 DIAGNOSIS — R471 Dysarthria and anarthria: Secondary | ICD-10-CM | POA: Diagnosis not present

## 2019-04-04 DIAGNOSIS — R488 Other symbolic dysfunctions: Secondary | ICD-10-CM | POA: Diagnosis not present

## 2019-04-04 DIAGNOSIS — R41841 Cognitive communication deficit: Secondary | ICD-10-CM | POA: Diagnosis not present

## 2019-04-04 DIAGNOSIS — Z9181 History of falling: Secondary | ICD-10-CM | POA: Diagnosis not present

## 2019-04-04 DIAGNOSIS — R1312 Dysphagia, oropharyngeal phase: Secondary | ICD-10-CM | POA: Diagnosis not present

## 2019-04-04 DIAGNOSIS — R471 Dysarthria and anarthria: Secondary | ICD-10-CM | POA: Diagnosis not present

## 2019-04-04 DIAGNOSIS — G20 Parkinson's disease: Secondary | ICD-10-CM | POA: Diagnosis not present

## 2019-04-05 DIAGNOSIS — Z9181 History of falling: Secondary | ICD-10-CM | POA: Diagnosis not present

## 2019-04-05 DIAGNOSIS — R41841 Cognitive communication deficit: Secondary | ICD-10-CM | POA: Diagnosis not present

## 2019-04-05 DIAGNOSIS — R471 Dysarthria and anarthria: Secondary | ICD-10-CM | POA: Diagnosis not present

## 2019-04-05 DIAGNOSIS — R1312 Dysphagia, oropharyngeal phase: Secondary | ICD-10-CM | POA: Diagnosis not present

## 2019-04-05 DIAGNOSIS — G20 Parkinson's disease: Secondary | ICD-10-CM | POA: Diagnosis not present

## 2019-04-05 DIAGNOSIS — F330 Major depressive disorder, recurrent, mild: Secondary | ICD-10-CM | POA: Diagnosis not present

## 2019-04-05 DIAGNOSIS — R488 Other symbolic dysfunctions: Secondary | ICD-10-CM | POA: Diagnosis not present

## 2019-04-06 DIAGNOSIS — R471 Dysarthria and anarthria: Secondary | ICD-10-CM | POA: Diagnosis not present

## 2019-04-06 DIAGNOSIS — R41841 Cognitive communication deficit: Secondary | ICD-10-CM | POA: Diagnosis not present

## 2019-04-06 DIAGNOSIS — Z9181 History of falling: Secondary | ICD-10-CM | POA: Diagnosis not present

## 2019-04-06 DIAGNOSIS — R488 Other symbolic dysfunctions: Secondary | ICD-10-CM | POA: Diagnosis not present

## 2019-04-06 DIAGNOSIS — R1312 Dysphagia, oropharyngeal phase: Secondary | ICD-10-CM | POA: Diagnosis not present

## 2019-04-06 DIAGNOSIS — G20 Parkinson's disease: Secondary | ICD-10-CM | POA: Diagnosis not present

## 2019-04-07 DIAGNOSIS — R1312 Dysphagia, oropharyngeal phase: Secondary | ICD-10-CM | POA: Diagnosis not present

## 2019-04-07 DIAGNOSIS — R488 Other symbolic dysfunctions: Secondary | ICD-10-CM | POA: Diagnosis not present

## 2019-04-07 DIAGNOSIS — Z9181 History of falling: Secondary | ICD-10-CM | POA: Diagnosis not present

## 2019-04-07 DIAGNOSIS — G20 Parkinson's disease: Secondary | ICD-10-CM | POA: Diagnosis not present

## 2019-04-07 DIAGNOSIS — R41841 Cognitive communication deficit: Secondary | ICD-10-CM | POA: Diagnosis not present

## 2019-04-07 DIAGNOSIS — R471 Dysarthria and anarthria: Secondary | ICD-10-CM | POA: Diagnosis not present

## 2019-04-08 DIAGNOSIS — R1312 Dysphagia, oropharyngeal phase: Secondary | ICD-10-CM | POA: Diagnosis not present

## 2019-04-08 DIAGNOSIS — R488 Other symbolic dysfunctions: Secondary | ICD-10-CM | POA: Diagnosis not present

## 2019-04-08 DIAGNOSIS — R41841 Cognitive communication deficit: Secondary | ICD-10-CM | POA: Diagnosis not present

## 2019-04-08 DIAGNOSIS — Z9181 History of falling: Secondary | ICD-10-CM | POA: Diagnosis not present

## 2019-04-08 DIAGNOSIS — G20 Parkinson's disease: Secondary | ICD-10-CM | POA: Diagnosis not present

## 2019-04-08 DIAGNOSIS — R471 Dysarthria and anarthria: Secondary | ICD-10-CM | POA: Diagnosis not present

## 2019-04-09 DIAGNOSIS — G20 Parkinson's disease: Secondary | ICD-10-CM | POA: Diagnosis not present

## 2019-04-09 DIAGNOSIS — R41841 Cognitive communication deficit: Secondary | ICD-10-CM | POA: Diagnosis not present

## 2019-04-09 DIAGNOSIS — R1312 Dysphagia, oropharyngeal phase: Secondary | ICD-10-CM | POA: Diagnosis not present

## 2019-04-09 DIAGNOSIS — R471 Dysarthria and anarthria: Secondary | ICD-10-CM | POA: Diagnosis not present

## 2019-04-09 DIAGNOSIS — Z9181 History of falling: Secondary | ICD-10-CM | POA: Diagnosis not present

## 2019-04-09 DIAGNOSIS — R488 Other symbolic dysfunctions: Secondary | ICD-10-CM | POA: Diagnosis not present

## 2019-04-11 DIAGNOSIS — Z9181 History of falling: Secondary | ICD-10-CM | POA: Diagnosis not present

## 2019-04-11 DIAGNOSIS — R471 Dysarthria and anarthria: Secondary | ICD-10-CM | POA: Diagnosis not present

## 2019-04-11 DIAGNOSIS — R1312 Dysphagia, oropharyngeal phase: Secondary | ICD-10-CM | POA: Diagnosis not present

## 2019-04-11 DIAGNOSIS — R488 Other symbolic dysfunctions: Secondary | ICD-10-CM | POA: Diagnosis not present

## 2019-04-11 DIAGNOSIS — R41841 Cognitive communication deficit: Secondary | ICD-10-CM | POA: Diagnosis not present

## 2019-04-11 DIAGNOSIS — G20 Parkinson's disease: Secondary | ICD-10-CM | POA: Diagnosis not present

## 2019-04-11 DIAGNOSIS — U071 COVID-19: Secondary | ICD-10-CM | POA: Diagnosis not present

## 2019-04-12 DIAGNOSIS — Z9181 History of falling: Secondary | ICD-10-CM | POA: Diagnosis not present

## 2019-04-12 DIAGNOSIS — R488 Other symbolic dysfunctions: Secondary | ICD-10-CM | POA: Diagnosis not present

## 2019-04-12 DIAGNOSIS — F330 Major depressive disorder, recurrent, mild: Secondary | ICD-10-CM | POA: Diagnosis not present

## 2019-04-12 DIAGNOSIS — R471 Dysarthria and anarthria: Secondary | ICD-10-CM | POA: Diagnosis not present

## 2019-04-12 DIAGNOSIS — R41841 Cognitive communication deficit: Secondary | ICD-10-CM | POA: Diagnosis not present

## 2019-04-12 DIAGNOSIS — G20 Parkinson's disease: Secondary | ICD-10-CM | POA: Diagnosis not present

## 2019-04-12 DIAGNOSIS — R1312 Dysphagia, oropharyngeal phase: Secondary | ICD-10-CM | POA: Diagnosis not present

## 2019-04-13 DIAGNOSIS — R1312 Dysphagia, oropharyngeal phase: Secondary | ICD-10-CM | POA: Diagnosis not present

## 2019-04-13 DIAGNOSIS — Z9181 History of falling: Secondary | ICD-10-CM | POA: Diagnosis not present

## 2019-04-13 DIAGNOSIS — R41841 Cognitive communication deficit: Secondary | ICD-10-CM | POA: Diagnosis not present

## 2019-04-13 DIAGNOSIS — R471 Dysarthria and anarthria: Secondary | ICD-10-CM | POA: Diagnosis not present

## 2019-04-13 DIAGNOSIS — G20 Parkinson's disease: Secondary | ICD-10-CM | POA: Diagnosis not present

## 2019-04-13 DIAGNOSIS — R488 Other symbolic dysfunctions: Secondary | ICD-10-CM | POA: Diagnosis not present

## 2019-04-13 NOTE — Progress Notes (Signed)
Remote pacemaker transmission.   

## 2019-04-14 DIAGNOSIS — R471 Dysarthria and anarthria: Secondary | ICD-10-CM | POA: Diagnosis not present

## 2019-04-14 DIAGNOSIS — G20 Parkinson's disease: Secondary | ICD-10-CM | POA: Diagnosis not present

## 2019-04-14 DIAGNOSIS — R1312 Dysphagia, oropharyngeal phase: Secondary | ICD-10-CM | POA: Diagnosis not present

## 2019-04-14 DIAGNOSIS — Z9181 History of falling: Secondary | ICD-10-CM | POA: Diagnosis not present

## 2019-04-14 DIAGNOSIS — R488 Other symbolic dysfunctions: Secondary | ICD-10-CM | POA: Diagnosis not present

## 2019-04-14 DIAGNOSIS — R41841 Cognitive communication deficit: Secondary | ICD-10-CM | POA: Diagnosis not present

## 2019-04-15 DIAGNOSIS — R471 Dysarthria and anarthria: Secondary | ICD-10-CM | POA: Diagnosis not present

## 2019-04-15 DIAGNOSIS — R488 Other symbolic dysfunctions: Secondary | ICD-10-CM | POA: Diagnosis not present

## 2019-04-15 DIAGNOSIS — R41841 Cognitive communication deficit: Secondary | ICD-10-CM | POA: Diagnosis not present

## 2019-04-15 DIAGNOSIS — R1312 Dysphagia, oropharyngeal phase: Secondary | ICD-10-CM | POA: Diagnosis not present

## 2019-04-15 DIAGNOSIS — Z9181 History of falling: Secondary | ICD-10-CM | POA: Diagnosis not present

## 2019-04-15 DIAGNOSIS — G20 Parkinson's disease: Secondary | ICD-10-CM | POA: Diagnosis not present

## 2019-04-16 DIAGNOSIS — G20 Parkinson's disease: Secondary | ICD-10-CM | POA: Diagnosis not present

## 2019-04-16 DIAGNOSIS — R1312 Dysphagia, oropharyngeal phase: Secondary | ICD-10-CM | POA: Diagnosis not present

## 2019-04-16 DIAGNOSIS — Z9181 History of falling: Secondary | ICD-10-CM | POA: Diagnosis not present

## 2019-04-16 DIAGNOSIS — R488 Other symbolic dysfunctions: Secondary | ICD-10-CM | POA: Diagnosis not present

## 2019-04-16 DIAGNOSIS — R41841 Cognitive communication deficit: Secondary | ICD-10-CM | POA: Diagnosis not present

## 2019-04-16 DIAGNOSIS — R471 Dysarthria and anarthria: Secondary | ICD-10-CM | POA: Diagnosis not present

## 2019-04-17 DIAGNOSIS — G20 Parkinson's disease: Secondary | ICD-10-CM | POA: Diagnosis not present

## 2019-04-17 DIAGNOSIS — R471 Dysarthria and anarthria: Secondary | ICD-10-CM | POA: Diagnosis not present

## 2019-04-17 DIAGNOSIS — R488 Other symbolic dysfunctions: Secondary | ICD-10-CM | POA: Diagnosis not present

## 2019-04-17 DIAGNOSIS — Z9181 History of falling: Secondary | ICD-10-CM | POA: Diagnosis not present

## 2019-04-17 DIAGNOSIS — R1312 Dysphagia, oropharyngeal phase: Secondary | ICD-10-CM | POA: Diagnosis not present

## 2019-04-17 DIAGNOSIS — R41841 Cognitive communication deficit: Secondary | ICD-10-CM | POA: Diagnosis not present

## 2019-04-18 DIAGNOSIS — Z9181 History of falling: Secondary | ICD-10-CM | POA: Diagnosis not present

## 2019-04-18 DIAGNOSIS — U071 COVID-19: Secondary | ICD-10-CM | POA: Diagnosis not present

## 2019-04-18 DIAGNOSIS — R488 Other symbolic dysfunctions: Secondary | ICD-10-CM | POA: Diagnosis not present

## 2019-04-18 DIAGNOSIS — R471 Dysarthria and anarthria: Secondary | ICD-10-CM | POA: Diagnosis not present

## 2019-04-18 DIAGNOSIS — G20 Parkinson's disease: Secondary | ICD-10-CM | POA: Diagnosis not present

## 2019-04-18 DIAGNOSIS — R41841 Cognitive communication deficit: Secondary | ICD-10-CM | POA: Diagnosis not present

## 2019-04-18 DIAGNOSIS — R1312 Dysphagia, oropharyngeal phase: Secondary | ICD-10-CM | POA: Diagnosis not present

## 2019-04-19 DIAGNOSIS — G20 Parkinson's disease: Secondary | ICD-10-CM | POA: Diagnosis not present

## 2019-04-19 DIAGNOSIS — R41841 Cognitive communication deficit: Secondary | ICD-10-CM | POA: Diagnosis not present

## 2019-04-19 DIAGNOSIS — R1312 Dysphagia, oropharyngeal phase: Secondary | ICD-10-CM | POA: Diagnosis not present

## 2019-04-19 DIAGNOSIS — Z9181 History of falling: Secondary | ICD-10-CM | POA: Diagnosis not present

## 2019-04-19 DIAGNOSIS — R471 Dysarthria and anarthria: Secondary | ICD-10-CM | POA: Diagnosis not present

## 2019-04-19 DIAGNOSIS — R488 Other symbolic dysfunctions: Secondary | ICD-10-CM | POA: Diagnosis not present

## 2019-04-20 DIAGNOSIS — R1312 Dysphagia, oropharyngeal phase: Secondary | ICD-10-CM | POA: Diagnosis not present

## 2019-04-20 DIAGNOSIS — Z9181 History of falling: Secondary | ICD-10-CM | POA: Diagnosis not present

## 2019-04-20 DIAGNOSIS — G20 Parkinson's disease: Secondary | ICD-10-CM | POA: Diagnosis not present

## 2019-04-20 DIAGNOSIS — F330 Major depressive disorder, recurrent, mild: Secondary | ICD-10-CM | POA: Diagnosis not present

## 2019-04-20 DIAGNOSIS — R41841 Cognitive communication deficit: Secondary | ICD-10-CM | POA: Diagnosis not present

## 2019-04-20 DIAGNOSIS — R488 Other symbolic dysfunctions: Secondary | ICD-10-CM | POA: Diagnosis not present

## 2019-04-20 DIAGNOSIS — R471 Dysarthria and anarthria: Secondary | ICD-10-CM | POA: Diagnosis not present

## 2019-04-21 DIAGNOSIS — R488 Other symbolic dysfunctions: Secondary | ICD-10-CM | POA: Diagnosis not present

## 2019-04-21 DIAGNOSIS — G20 Parkinson's disease: Secondary | ICD-10-CM | POA: Diagnosis not present

## 2019-04-21 DIAGNOSIS — R1312 Dysphagia, oropharyngeal phase: Secondary | ICD-10-CM | POA: Diagnosis not present

## 2019-04-21 DIAGNOSIS — Z9181 History of falling: Secondary | ICD-10-CM | POA: Diagnosis not present

## 2019-04-21 DIAGNOSIS — R41841 Cognitive communication deficit: Secondary | ICD-10-CM | POA: Diagnosis not present

## 2019-04-21 DIAGNOSIS — R471 Dysarthria and anarthria: Secondary | ICD-10-CM | POA: Diagnosis not present

## 2019-04-22 DIAGNOSIS — R488 Other symbolic dysfunctions: Secondary | ICD-10-CM | POA: Diagnosis not present

## 2019-04-22 DIAGNOSIS — G20 Parkinson's disease: Secondary | ICD-10-CM | POA: Diagnosis not present

## 2019-04-22 DIAGNOSIS — Z9181 History of falling: Secondary | ICD-10-CM | POA: Diagnosis not present

## 2019-04-22 DIAGNOSIS — R471 Dysarthria and anarthria: Secondary | ICD-10-CM | POA: Diagnosis not present

## 2019-04-22 DIAGNOSIS — R41841 Cognitive communication deficit: Secondary | ICD-10-CM | POA: Diagnosis not present

## 2019-04-22 DIAGNOSIS — R1312 Dysphagia, oropharyngeal phase: Secondary | ICD-10-CM | POA: Diagnosis not present

## 2019-04-23 DIAGNOSIS — R41841 Cognitive communication deficit: Secondary | ICD-10-CM | POA: Diagnosis not present

## 2019-04-23 DIAGNOSIS — G20 Parkinson's disease: Secondary | ICD-10-CM | POA: Diagnosis not present

## 2019-04-23 DIAGNOSIS — R471 Dysarthria and anarthria: Secondary | ICD-10-CM | POA: Diagnosis not present

## 2019-04-23 DIAGNOSIS — Z9181 History of falling: Secondary | ICD-10-CM | POA: Diagnosis not present

## 2019-04-23 DIAGNOSIS — R488 Other symbolic dysfunctions: Secondary | ICD-10-CM | POA: Diagnosis not present

## 2019-04-23 DIAGNOSIS — R1312 Dysphagia, oropharyngeal phase: Secondary | ICD-10-CM | POA: Diagnosis not present

## 2019-04-24 DIAGNOSIS — R471 Dysarthria and anarthria: Secondary | ICD-10-CM | POA: Diagnosis not present

## 2019-04-24 DIAGNOSIS — G20 Parkinson's disease: Secondary | ICD-10-CM | POA: Diagnosis not present

## 2019-04-24 DIAGNOSIS — R488 Other symbolic dysfunctions: Secondary | ICD-10-CM | POA: Diagnosis not present

## 2019-04-24 DIAGNOSIS — R41841 Cognitive communication deficit: Secondary | ICD-10-CM | POA: Diagnosis not present

## 2019-04-24 DIAGNOSIS — Z9181 History of falling: Secondary | ICD-10-CM | POA: Diagnosis not present

## 2019-04-24 DIAGNOSIS — R1312 Dysphagia, oropharyngeal phase: Secondary | ICD-10-CM | POA: Diagnosis not present

## 2019-04-25 DIAGNOSIS — G20 Parkinson's disease: Secondary | ICD-10-CM | POA: Diagnosis not present

## 2019-04-25 DIAGNOSIS — R41841 Cognitive communication deficit: Secondary | ICD-10-CM | POA: Diagnosis not present

## 2019-04-25 DIAGNOSIS — Z9181 History of falling: Secondary | ICD-10-CM | POA: Diagnosis not present

## 2019-04-25 DIAGNOSIS — R1312 Dysphagia, oropharyngeal phase: Secondary | ICD-10-CM | POA: Diagnosis not present

## 2019-04-25 DIAGNOSIS — R471 Dysarthria and anarthria: Secondary | ICD-10-CM | POA: Diagnosis not present

## 2019-04-25 DIAGNOSIS — R488 Other symbolic dysfunctions: Secondary | ICD-10-CM | POA: Diagnosis not present

## 2019-04-26 DIAGNOSIS — R471 Dysarthria and anarthria: Secondary | ICD-10-CM | POA: Diagnosis not present

## 2019-04-26 DIAGNOSIS — Z9181 History of falling: Secondary | ICD-10-CM | POA: Diagnosis not present

## 2019-04-26 DIAGNOSIS — R1312 Dysphagia, oropharyngeal phase: Secondary | ICD-10-CM | POA: Diagnosis not present

## 2019-04-26 DIAGNOSIS — R488 Other symbolic dysfunctions: Secondary | ICD-10-CM | POA: Diagnosis not present

## 2019-04-26 DIAGNOSIS — F330 Major depressive disorder, recurrent, mild: Secondary | ICD-10-CM | POA: Diagnosis not present

## 2019-04-26 DIAGNOSIS — R41841 Cognitive communication deficit: Secondary | ICD-10-CM | POA: Diagnosis not present

## 2019-04-26 DIAGNOSIS — G20 Parkinson's disease: Secondary | ICD-10-CM | POA: Diagnosis not present

## 2019-04-27 DIAGNOSIS — Z9181 History of falling: Secondary | ICD-10-CM | POA: Diagnosis not present

## 2019-04-27 DIAGNOSIS — G20 Parkinson's disease: Secondary | ICD-10-CM | POA: Diagnosis not present

## 2019-04-27 DIAGNOSIS — R488 Other symbolic dysfunctions: Secondary | ICD-10-CM | POA: Diagnosis not present

## 2019-04-27 DIAGNOSIS — R41841 Cognitive communication deficit: Secondary | ICD-10-CM | POA: Diagnosis not present

## 2019-04-27 DIAGNOSIS — R1312 Dysphagia, oropharyngeal phase: Secondary | ICD-10-CM | POA: Diagnosis not present

## 2019-04-27 DIAGNOSIS — R471 Dysarthria and anarthria: Secondary | ICD-10-CM | POA: Diagnosis not present

## 2019-04-28 DIAGNOSIS — R2689 Other abnormalities of gait and mobility: Secondary | ICD-10-CM | POA: Diagnosis not present

## 2019-04-28 DIAGNOSIS — G20 Parkinson's disease: Secondary | ICD-10-CM | POA: Diagnosis not present

## 2019-04-28 DIAGNOSIS — R1312 Dysphagia, oropharyngeal phase: Secondary | ICD-10-CM | POA: Diagnosis not present

## 2019-04-28 DIAGNOSIS — R471 Dysarthria and anarthria: Secondary | ICD-10-CM | POA: Diagnosis not present

## 2019-04-28 DIAGNOSIS — R293 Abnormal posture: Secondary | ICD-10-CM | POA: Diagnosis not present

## 2019-04-28 DIAGNOSIS — Z9181 History of falling: Secondary | ICD-10-CM | POA: Diagnosis not present

## 2019-04-28 DIAGNOSIS — M6281 Muscle weakness (generalized): Secondary | ICD-10-CM | POA: Diagnosis not present

## 2019-04-28 DIAGNOSIS — M25519 Pain in unspecified shoulder: Secondary | ICD-10-CM | POA: Diagnosis not present

## 2019-04-28 DIAGNOSIS — R41841 Cognitive communication deficit: Secondary | ICD-10-CM | POA: Diagnosis not present

## 2019-04-28 DIAGNOSIS — R488 Other symbolic dysfunctions: Secondary | ICD-10-CM | POA: Diagnosis not present

## 2019-04-29 DIAGNOSIS — R41841 Cognitive communication deficit: Secondary | ICD-10-CM | POA: Diagnosis not present

## 2019-04-29 DIAGNOSIS — Z9181 History of falling: Secondary | ICD-10-CM | POA: Diagnosis not present

## 2019-04-29 DIAGNOSIS — R488 Other symbolic dysfunctions: Secondary | ICD-10-CM | POA: Diagnosis not present

## 2019-04-29 DIAGNOSIS — R471 Dysarthria and anarthria: Secondary | ICD-10-CM | POA: Diagnosis not present

## 2019-04-29 DIAGNOSIS — G20 Parkinson's disease: Secondary | ICD-10-CM | POA: Diagnosis not present

## 2019-04-29 DIAGNOSIS — R1312 Dysphagia, oropharyngeal phase: Secondary | ICD-10-CM | POA: Diagnosis not present

## 2019-04-30 DIAGNOSIS — R41841 Cognitive communication deficit: Secondary | ICD-10-CM | POA: Diagnosis not present

## 2019-04-30 DIAGNOSIS — Z23 Encounter for immunization: Secondary | ICD-10-CM | POA: Diagnosis not present

## 2019-04-30 DIAGNOSIS — Z9181 History of falling: Secondary | ICD-10-CM | POA: Diagnosis not present

## 2019-04-30 DIAGNOSIS — G20 Parkinson's disease: Secondary | ICD-10-CM | POA: Diagnosis not present

## 2019-04-30 DIAGNOSIS — R488 Other symbolic dysfunctions: Secondary | ICD-10-CM | POA: Diagnosis not present

## 2019-04-30 DIAGNOSIS — R471 Dysarthria and anarthria: Secondary | ICD-10-CM | POA: Diagnosis not present

## 2019-04-30 DIAGNOSIS — R1312 Dysphagia, oropharyngeal phase: Secondary | ICD-10-CM | POA: Diagnosis not present

## 2019-05-02 DIAGNOSIS — R41841 Cognitive communication deficit: Secondary | ICD-10-CM | POA: Diagnosis not present

## 2019-05-02 DIAGNOSIS — R1312 Dysphagia, oropharyngeal phase: Secondary | ICD-10-CM | POA: Diagnosis not present

## 2019-05-02 DIAGNOSIS — Z9181 History of falling: Secondary | ICD-10-CM | POA: Diagnosis not present

## 2019-05-02 DIAGNOSIS — R488 Other symbolic dysfunctions: Secondary | ICD-10-CM | POA: Diagnosis not present

## 2019-05-02 DIAGNOSIS — G20 Parkinson's disease: Secondary | ICD-10-CM | POA: Diagnosis not present

## 2019-05-02 DIAGNOSIS — R471 Dysarthria and anarthria: Secondary | ICD-10-CM | POA: Diagnosis not present

## 2019-05-03 DIAGNOSIS — F330 Major depressive disorder, recurrent, mild: Secondary | ICD-10-CM | POA: Diagnosis not present

## 2019-05-03 DIAGNOSIS — R1312 Dysphagia, oropharyngeal phase: Secondary | ICD-10-CM | POA: Diagnosis not present

## 2019-05-03 DIAGNOSIS — R41841 Cognitive communication deficit: Secondary | ICD-10-CM | POA: Diagnosis not present

## 2019-05-03 DIAGNOSIS — Z9181 History of falling: Secondary | ICD-10-CM | POA: Diagnosis not present

## 2019-05-03 DIAGNOSIS — G20 Parkinson's disease: Secondary | ICD-10-CM | POA: Diagnosis not present

## 2019-05-03 DIAGNOSIS — R471 Dysarthria and anarthria: Secondary | ICD-10-CM | POA: Diagnosis not present

## 2019-05-03 DIAGNOSIS — R488 Other symbolic dysfunctions: Secondary | ICD-10-CM | POA: Diagnosis not present

## 2019-05-04 DIAGNOSIS — R41841 Cognitive communication deficit: Secondary | ICD-10-CM | POA: Diagnosis not present

## 2019-05-04 DIAGNOSIS — R471 Dysarthria and anarthria: Secondary | ICD-10-CM | POA: Diagnosis not present

## 2019-05-04 DIAGNOSIS — R1312 Dysphagia, oropharyngeal phase: Secondary | ICD-10-CM | POA: Diagnosis not present

## 2019-05-04 DIAGNOSIS — R488 Other symbolic dysfunctions: Secondary | ICD-10-CM | POA: Diagnosis not present

## 2019-05-04 DIAGNOSIS — G20 Parkinson's disease: Secondary | ICD-10-CM | POA: Diagnosis not present

## 2019-05-04 DIAGNOSIS — Z9181 History of falling: Secondary | ICD-10-CM | POA: Diagnosis not present

## 2019-05-05 DIAGNOSIS — R1312 Dysphagia, oropharyngeal phase: Secondary | ICD-10-CM | POA: Diagnosis not present

## 2019-05-05 DIAGNOSIS — R471 Dysarthria and anarthria: Secondary | ICD-10-CM | POA: Diagnosis not present

## 2019-05-05 DIAGNOSIS — R41841 Cognitive communication deficit: Secondary | ICD-10-CM | POA: Diagnosis not present

## 2019-05-05 DIAGNOSIS — Z9181 History of falling: Secondary | ICD-10-CM | POA: Diagnosis not present

## 2019-05-05 DIAGNOSIS — R488 Other symbolic dysfunctions: Secondary | ICD-10-CM | POA: Diagnosis not present

## 2019-05-05 DIAGNOSIS — G20 Parkinson's disease: Secondary | ICD-10-CM | POA: Diagnosis not present

## 2019-05-06 DIAGNOSIS — R471 Dysarthria and anarthria: Secondary | ICD-10-CM | POA: Diagnosis not present

## 2019-05-06 DIAGNOSIS — R488 Other symbolic dysfunctions: Secondary | ICD-10-CM | POA: Diagnosis not present

## 2019-05-06 DIAGNOSIS — R41841 Cognitive communication deficit: Secondary | ICD-10-CM | POA: Diagnosis not present

## 2019-05-06 DIAGNOSIS — G20 Parkinson's disease: Secondary | ICD-10-CM | POA: Diagnosis not present

## 2019-05-06 DIAGNOSIS — Z9181 History of falling: Secondary | ICD-10-CM | POA: Diagnosis not present

## 2019-05-06 DIAGNOSIS — R1312 Dysphagia, oropharyngeal phase: Secondary | ICD-10-CM | POA: Diagnosis not present

## 2019-05-07 DIAGNOSIS — G20 Parkinson's disease: Secondary | ICD-10-CM | POA: Diagnosis not present

## 2019-05-07 DIAGNOSIS — R488 Other symbolic dysfunctions: Secondary | ICD-10-CM | POA: Diagnosis not present

## 2019-05-07 DIAGNOSIS — Z9181 History of falling: Secondary | ICD-10-CM | POA: Diagnosis not present

## 2019-05-07 DIAGNOSIS — R41841 Cognitive communication deficit: Secondary | ICD-10-CM | POA: Diagnosis not present

## 2019-05-07 DIAGNOSIS — R471 Dysarthria and anarthria: Secondary | ICD-10-CM | POA: Diagnosis not present

## 2019-05-07 DIAGNOSIS — R1312 Dysphagia, oropharyngeal phase: Secondary | ICD-10-CM | POA: Diagnosis not present

## 2019-05-09 DIAGNOSIS — R471 Dysarthria and anarthria: Secondary | ICD-10-CM | POA: Diagnosis not present

## 2019-05-09 DIAGNOSIS — R1312 Dysphagia, oropharyngeal phase: Secondary | ICD-10-CM | POA: Diagnosis not present

## 2019-05-09 DIAGNOSIS — Z9181 History of falling: Secondary | ICD-10-CM | POA: Diagnosis not present

## 2019-05-09 DIAGNOSIS — R488 Other symbolic dysfunctions: Secondary | ICD-10-CM | POA: Diagnosis not present

## 2019-05-09 DIAGNOSIS — R41841 Cognitive communication deficit: Secondary | ICD-10-CM | POA: Diagnosis not present

## 2019-05-09 DIAGNOSIS — G20 Parkinson's disease: Secondary | ICD-10-CM | POA: Diagnosis not present

## 2019-05-10 DIAGNOSIS — R41841 Cognitive communication deficit: Secondary | ICD-10-CM | POA: Diagnosis not present

## 2019-05-10 DIAGNOSIS — R488 Other symbolic dysfunctions: Secondary | ICD-10-CM | POA: Diagnosis not present

## 2019-05-10 DIAGNOSIS — F330 Major depressive disorder, recurrent, mild: Secondary | ICD-10-CM | POA: Diagnosis not present

## 2019-05-10 DIAGNOSIS — R471 Dysarthria and anarthria: Secondary | ICD-10-CM | POA: Diagnosis not present

## 2019-05-10 DIAGNOSIS — G20 Parkinson's disease: Secondary | ICD-10-CM | POA: Diagnosis not present

## 2019-05-10 DIAGNOSIS — Z9181 History of falling: Secondary | ICD-10-CM | POA: Diagnosis not present

## 2019-05-10 DIAGNOSIS — R1312 Dysphagia, oropharyngeal phase: Secondary | ICD-10-CM | POA: Diagnosis not present

## 2019-05-11 DIAGNOSIS — Z9181 History of falling: Secondary | ICD-10-CM | POA: Diagnosis not present

## 2019-05-11 DIAGNOSIS — R488 Other symbolic dysfunctions: Secondary | ICD-10-CM | POA: Diagnosis not present

## 2019-05-11 DIAGNOSIS — G20 Parkinson's disease: Secondary | ICD-10-CM | POA: Diagnosis not present

## 2019-05-11 DIAGNOSIS — R41841 Cognitive communication deficit: Secondary | ICD-10-CM | POA: Diagnosis not present

## 2019-05-11 DIAGNOSIS — R1312 Dysphagia, oropharyngeal phase: Secondary | ICD-10-CM | POA: Diagnosis not present

## 2019-05-11 DIAGNOSIS — R471 Dysarthria and anarthria: Secondary | ICD-10-CM | POA: Diagnosis not present

## 2019-05-12 DIAGNOSIS — Z9181 History of falling: Secondary | ICD-10-CM | POA: Diagnosis not present

## 2019-05-12 DIAGNOSIS — R488 Other symbolic dysfunctions: Secondary | ICD-10-CM | POA: Diagnosis not present

## 2019-05-12 DIAGNOSIS — R471 Dysarthria and anarthria: Secondary | ICD-10-CM | POA: Diagnosis not present

## 2019-05-12 DIAGNOSIS — R41841 Cognitive communication deficit: Secondary | ICD-10-CM | POA: Diagnosis not present

## 2019-05-12 DIAGNOSIS — G20 Parkinson's disease: Secondary | ICD-10-CM | POA: Diagnosis not present

## 2019-05-12 DIAGNOSIS — R1312 Dysphagia, oropharyngeal phase: Secondary | ICD-10-CM | POA: Diagnosis not present

## 2019-05-13 DIAGNOSIS — R41841 Cognitive communication deficit: Secondary | ICD-10-CM | POA: Diagnosis not present

## 2019-05-13 DIAGNOSIS — R1312 Dysphagia, oropharyngeal phase: Secondary | ICD-10-CM | POA: Diagnosis not present

## 2019-05-13 DIAGNOSIS — R488 Other symbolic dysfunctions: Secondary | ICD-10-CM | POA: Diagnosis not present

## 2019-05-13 DIAGNOSIS — R471 Dysarthria and anarthria: Secondary | ICD-10-CM | POA: Diagnosis not present

## 2019-05-13 DIAGNOSIS — Z9181 History of falling: Secondary | ICD-10-CM | POA: Diagnosis not present

## 2019-05-13 DIAGNOSIS — G20 Parkinson's disease: Secondary | ICD-10-CM | POA: Diagnosis not present

## 2019-05-15 DIAGNOSIS — R471 Dysarthria and anarthria: Secondary | ICD-10-CM | POA: Diagnosis not present

## 2019-05-15 DIAGNOSIS — R1312 Dysphagia, oropharyngeal phase: Secondary | ICD-10-CM | POA: Diagnosis not present

## 2019-05-15 DIAGNOSIS — Z9181 History of falling: Secondary | ICD-10-CM | POA: Diagnosis not present

## 2019-05-15 DIAGNOSIS — G20 Parkinson's disease: Secondary | ICD-10-CM | POA: Diagnosis not present

## 2019-05-15 DIAGNOSIS — R41841 Cognitive communication deficit: Secondary | ICD-10-CM | POA: Diagnosis not present

## 2019-05-15 DIAGNOSIS — R488 Other symbolic dysfunctions: Secondary | ICD-10-CM | POA: Diagnosis not present

## 2019-05-16 DIAGNOSIS — R1312 Dysphagia, oropharyngeal phase: Secondary | ICD-10-CM | POA: Diagnosis not present

## 2019-05-16 DIAGNOSIS — G20 Parkinson's disease: Secondary | ICD-10-CM | POA: Diagnosis not present

## 2019-05-16 DIAGNOSIS — R488 Other symbolic dysfunctions: Secondary | ICD-10-CM | POA: Diagnosis not present

## 2019-05-16 DIAGNOSIS — Z9181 History of falling: Secondary | ICD-10-CM | POA: Diagnosis not present

## 2019-05-16 DIAGNOSIS — R41841 Cognitive communication deficit: Secondary | ICD-10-CM | POA: Diagnosis not present

## 2019-05-16 DIAGNOSIS — R471 Dysarthria and anarthria: Secondary | ICD-10-CM | POA: Diagnosis not present

## 2019-05-17 DIAGNOSIS — G20 Parkinson's disease: Secondary | ICD-10-CM | POA: Diagnosis not present

## 2019-05-17 DIAGNOSIS — N400 Enlarged prostate without lower urinary tract symptoms: Secondary | ICD-10-CM | POA: Diagnosis not present

## 2019-05-17 DIAGNOSIS — R1312 Dysphagia, oropharyngeal phase: Secondary | ICD-10-CM | POA: Diagnosis not present

## 2019-05-17 DIAGNOSIS — R471 Dysarthria and anarthria: Secondary | ICD-10-CM | POA: Diagnosis not present

## 2019-05-17 DIAGNOSIS — R488 Other symbolic dysfunctions: Secondary | ICD-10-CM | POA: Diagnosis not present

## 2019-05-17 DIAGNOSIS — Z9181 History of falling: Secondary | ICD-10-CM | POA: Diagnosis not present

## 2019-05-17 DIAGNOSIS — R41841 Cognitive communication deficit: Secondary | ICD-10-CM | POA: Diagnosis not present

## 2019-05-18 DIAGNOSIS — Z9181 History of falling: Secondary | ICD-10-CM | POA: Diagnosis not present

## 2019-05-18 DIAGNOSIS — R41841 Cognitive communication deficit: Secondary | ICD-10-CM | POA: Diagnosis not present

## 2019-05-18 DIAGNOSIS — R1312 Dysphagia, oropharyngeal phase: Secondary | ICD-10-CM | POA: Diagnosis not present

## 2019-05-18 DIAGNOSIS — G20 Parkinson's disease: Secondary | ICD-10-CM | POA: Diagnosis not present

## 2019-05-18 DIAGNOSIS — R488 Other symbolic dysfunctions: Secondary | ICD-10-CM | POA: Diagnosis not present

## 2019-05-18 DIAGNOSIS — R471 Dysarthria and anarthria: Secondary | ICD-10-CM | POA: Diagnosis not present

## 2019-05-19 DIAGNOSIS — R41841 Cognitive communication deficit: Secondary | ICD-10-CM | POA: Diagnosis not present

## 2019-05-19 DIAGNOSIS — G20 Parkinson's disease: Secondary | ICD-10-CM | POA: Diagnosis not present

## 2019-05-19 DIAGNOSIS — R471 Dysarthria and anarthria: Secondary | ICD-10-CM | POA: Diagnosis not present

## 2019-05-19 DIAGNOSIS — R488 Other symbolic dysfunctions: Secondary | ICD-10-CM | POA: Diagnosis not present

## 2019-05-19 DIAGNOSIS — R1312 Dysphagia, oropharyngeal phase: Secondary | ICD-10-CM | POA: Diagnosis not present

## 2019-05-19 DIAGNOSIS — Z9181 History of falling: Secondary | ICD-10-CM | POA: Diagnosis not present

## 2019-05-20 DIAGNOSIS — Z9181 History of falling: Secondary | ICD-10-CM | POA: Diagnosis not present

## 2019-05-20 DIAGNOSIS — R41841 Cognitive communication deficit: Secondary | ICD-10-CM | POA: Diagnosis not present

## 2019-05-20 DIAGNOSIS — R488 Other symbolic dysfunctions: Secondary | ICD-10-CM | POA: Diagnosis not present

## 2019-05-20 DIAGNOSIS — R471 Dysarthria and anarthria: Secondary | ICD-10-CM | POA: Diagnosis not present

## 2019-05-20 DIAGNOSIS — G20 Parkinson's disease: Secondary | ICD-10-CM | POA: Diagnosis not present

## 2019-05-20 DIAGNOSIS — R1312 Dysphagia, oropharyngeal phase: Secondary | ICD-10-CM | POA: Diagnosis not present

## 2019-05-23 DIAGNOSIS — R471 Dysarthria and anarthria: Secondary | ICD-10-CM | POA: Diagnosis not present

## 2019-05-23 DIAGNOSIS — R488 Other symbolic dysfunctions: Secondary | ICD-10-CM | POA: Diagnosis not present

## 2019-05-23 DIAGNOSIS — R1312 Dysphagia, oropharyngeal phase: Secondary | ICD-10-CM | POA: Diagnosis not present

## 2019-05-23 DIAGNOSIS — G20 Parkinson's disease: Secondary | ICD-10-CM | POA: Diagnosis not present

## 2019-05-23 DIAGNOSIS — Z9181 History of falling: Secondary | ICD-10-CM | POA: Diagnosis not present

## 2019-05-23 DIAGNOSIS — R41841 Cognitive communication deficit: Secondary | ICD-10-CM | POA: Diagnosis not present

## 2019-05-24 ENCOUNTER — Emergency Department (HOSPITAL_COMMUNITY)
Admission: EM | Admit: 2019-05-24 | Discharge: 2019-05-25 | Disposition: A | Discharge status: Home / Self Care | Payer: Medicare Other | Attending: Emergency Medicine | Admitting: Emergency Medicine

## 2019-05-24 ENCOUNTER — Emergency Department (HOSPITAL_COMMUNITY): Payer: Medicare Other

## 2019-05-24 ENCOUNTER — Other Ambulatory Visit: Payer: Self-pay

## 2019-05-24 ENCOUNTER — Encounter (HOSPITAL_COMMUNITY): Payer: Self-pay

## 2019-05-24 DIAGNOSIS — R0902 Hypoxemia: Secondary | ICD-10-CM | POA: Diagnosis not present

## 2019-05-24 DIAGNOSIS — S0101XA Laceration without foreign body of scalp, initial encounter: Secondary | ICD-10-CM

## 2019-05-24 DIAGNOSIS — Z79899 Other long term (current) drug therapy: Secondary | ICD-10-CM | POA: Diagnosis not present

## 2019-05-24 DIAGNOSIS — Y92128 Other place in nursing home as the place of occurrence of the external cause: Secondary | ICD-10-CM | POA: Insufficient documentation

## 2019-05-24 DIAGNOSIS — Y9301 Activity, walking, marching and hiking: Secondary | ICD-10-CM | POA: Diagnosis not present

## 2019-05-24 DIAGNOSIS — N3000 Acute cystitis without hematuria: Secondary | ICD-10-CM

## 2019-05-24 DIAGNOSIS — I447 Left bundle-branch block, unspecified: Secondary | ICD-10-CM | POA: Diagnosis not present

## 2019-05-24 DIAGNOSIS — S81811A Laceration without foreign body, right lower leg, initial encounter: Secondary | ICD-10-CM | POA: Diagnosis not present

## 2019-05-24 DIAGNOSIS — W010XXA Fall on same level from slipping, tripping and stumbling without subsequent striking against object, initial encounter: Secondary | ICD-10-CM | POA: Diagnosis not present

## 2019-05-24 DIAGNOSIS — S0990XA Unspecified injury of head, initial encounter: Secondary | ICD-10-CM | POA: Diagnosis not present

## 2019-05-24 DIAGNOSIS — Y999 Unspecified external cause status: Secondary | ICD-10-CM | POA: Diagnosis not present

## 2019-05-24 DIAGNOSIS — I509 Heart failure, unspecified: Secondary | ICD-10-CM | POA: Diagnosis not present

## 2019-05-24 DIAGNOSIS — Z87891 Personal history of nicotine dependence: Secondary | ICD-10-CM | POA: Diagnosis not present

## 2019-05-24 DIAGNOSIS — R471 Dysarthria and anarthria: Secondary | ICD-10-CM | POA: Diagnosis not present

## 2019-05-24 DIAGNOSIS — R488 Other symbolic dysfunctions: Secondary | ICD-10-CM | POA: Diagnosis not present

## 2019-05-24 DIAGNOSIS — I110 Hypertensive heart disease with heart failure: Secondary | ICD-10-CM | POA: Insufficient documentation

## 2019-05-24 DIAGNOSIS — S199XXA Unspecified injury of neck, initial encounter: Secondary | ICD-10-CM | POA: Diagnosis not present

## 2019-05-24 DIAGNOSIS — W19XXXA Unspecified fall, initial encounter: Secondary | ICD-10-CM | POA: Diagnosis not present

## 2019-05-24 DIAGNOSIS — I4891 Unspecified atrial fibrillation: Secondary | ICD-10-CM | POA: Insufficient documentation

## 2019-05-24 DIAGNOSIS — G20 Parkinson's disease: Secondary | ICD-10-CM | POA: Insufficient documentation

## 2019-05-24 DIAGNOSIS — M542 Cervicalgia: Secondary | ICD-10-CM | POA: Diagnosis not present

## 2019-05-24 DIAGNOSIS — Z9181 History of falling: Secondary | ICD-10-CM | POA: Diagnosis not present

## 2019-05-24 DIAGNOSIS — Z950 Presence of cardiac pacemaker: Secondary | ICD-10-CM | POA: Insufficient documentation

## 2019-05-24 DIAGNOSIS — R1312 Dysphagia, oropharyngeal phase: Secondary | ICD-10-CM | POA: Diagnosis not present

## 2019-05-24 DIAGNOSIS — E785 Hyperlipidemia, unspecified: Secondary | ICD-10-CM | POA: Insufficient documentation

## 2019-05-24 DIAGNOSIS — R41841 Cognitive communication deficit: Secondary | ICD-10-CM | POA: Diagnosis not present

## 2019-05-24 DIAGNOSIS — R519 Headache, unspecified: Secondary | ICD-10-CM | POA: Diagnosis not present

## 2019-05-24 LAB — URINALYSIS, ROUTINE W REFLEX MICROSCOPIC
Bilirubin Urine: NEGATIVE
Glucose, UA: NEGATIVE mg/dL
Hgb urine dipstick: NEGATIVE
Ketones, ur: NEGATIVE mg/dL
Nitrite: NEGATIVE
Protein, ur: NEGATIVE mg/dL
Specific Gravity, Urine: 1.014 (ref 1.005–1.030)
WBC, UA: >50 WBC/hpf — ABNORMAL HIGH (ref 0–5)
pH: 5 (ref 5.0–8.0)

## 2019-05-24 LAB — BASIC METABOLIC PANEL
Anion gap: 9 (ref 5–15)
BUN: 28 mg/dL — ABNORMAL HIGH (ref 8–23)
CO2: 23 mmol/L (ref 22–32)
Calcium: 8.5 mg/dL — ABNORMAL LOW (ref 8.9–10.3)
Chloride: 103 mmol/L (ref 98–111)
Creatinine, Ser: 1.14 mg/dL (ref 0.61–1.24)
GFR calc Af Amer: >60 mL/min (ref >60)
GFR calc non Af Amer: 56 mL/min — ABNORMAL LOW (ref >60)
Glucose, Bld: 112 mg/dL — ABNORMAL HIGH (ref 70–99)
Potassium: 4 mmol/L (ref 3.5–5.1)
Sodium: 135 mmol/L (ref 135–145)

## 2019-05-24 LAB — CBG MONITORING, ED: Glucose-Capillary: 100 mg/dL — ABNORMAL HIGH (ref 70–99)

## 2019-05-24 LAB — CBC
HCT: 35.6 % — ABNORMAL LOW (ref 39.0–52.0)
Hemoglobin: 11.4 g/dL — ABNORMAL LOW (ref 13.0–17.0)
MCH: 32 pg (ref 26.0–34.0)
MCHC: 32 g/dL (ref 30.0–36.0)
MCV: 100 fL (ref 80.0–100.0)
Platelets: 139 10*3/uL — ABNORMAL LOW (ref 150–400)
RBC: 3.56 MIL/uL — ABNORMAL LOW (ref 4.22–5.81)
RDW: 13.9 % (ref 11.5–15.5)
WBC: 5.4 10*3/uL (ref 4.0–10.5)
nRBC: 0 % (ref 0.0–0.2)

## 2019-05-24 MED ORDER — AMOXICILLIN-POT CLAVULANATE 875-125 MG PO TABS: 1.0000 | ORAL_TABLET | Freq: Two times a day (BID) | ORAL | 0 refills | Status: AC

## 2019-05-24 MED ORDER — SODIUM CHLORIDE 0.9% FLUSH
3.0000 mL | Freq: Once | INTRAVENOUS | Status: AC
  Administered 2019-05-24: 3 mL via INTRAVENOUS

## 2019-05-24 MED ORDER — NITROFURANTOIN MONOHYD MACRO 100 MG PO CAPS: 100.0000 mg | ORAL_CAPSULE | Freq: Once | ORAL | Status: DC

## 2019-05-24 MED ORDER — AMOXICILLIN-POT CLAVULANATE 875-125 MG PO TABS
1.0000 | ORAL_TABLET | Freq: Once | ORAL | Status: AC
  Administered 2019-05-24: 1 via ORAL
  Filled 2019-05-24: qty 1

## 2019-05-24 MED ORDER — LIDOCAINE-EPINEPHRINE (PF) 2 %-1:200000 IJ SOLN
20.0000 mL | Freq: Once | INTRAMUSCULAR | Status: AC
  Administered 2019-05-24: 20 mL
  Filled 2019-05-24: qty 20

## 2019-05-24 MED ORDER — LIDOCAINE-EPINEPHRINE-TETRACAINE (LET) SOLUTION
3.0000 mL | Freq: Once | NASAL | Status: AC
  Administered 2019-05-24: 3 mL via TOPICAL
  Filled 2019-05-24: qty 3

## 2019-05-24 NOTE — ED Notes (Signed)
Condom Cath was placed.

## 2019-05-24 NOTE — ED Notes (Signed)
Family at bedside. 

## 2019-05-24 NOTE — ED Notes (Signed)
Patient transported to CT 

## 2019-05-24 NOTE — ED Triage Notes (Signed)
Pt bib [facility] from [facility] after fall. Pt on xeralto. Suspicious for LOC as pt does not remember falling. Pt AOx4 at this time which is baseline. Neuro intact. Small lac to L side of head above eye.

## 2019-05-24 NOTE — ED Provider Notes (Signed)
ED ECG REPORT   Date: 05/24/2019  Rate: 60  Rhythm: atrial fibrillation and atrial flutter  QRS Axis: normal  Intervals: normal  ST/T Wave abnormalities: normal  Conduction Disutrbances:left bundle branch block  Narrative Interpretation:   Old EKG Reviewed: unchanged  I have personally reviewed the EKG tracing and agree with the computerized printout as noted.    Kadarius Cuffe, Gwenyth Allegra, MD 05/24/19 2322

## 2019-05-24 NOTE — ED Provider Notes (Signed)
[place] EMERGENCY DEPARTMENT Provider Note   CSN: [id number] Arrival date & time: 05/24/19  1739     History   Chief Complaint Chief Complaint  Patient presents with   Fall    HPI [name] is a 85 y.o. male past medical history significant for A. fib on Xarelto, CHF, dementia, Parkinson disease presents to emergency department today via EMS with chief complaint of fall.  Patient currently lives at [facility].  He reports he had gotten up to use the urinal when he lost his footing and fell landing on his left side.  He hit his head on the floor.  He is unsure if he lost consciousness because he does not remember what happened afterwards.  He is also reporting pain in his right lower extremity from the fall.  He denies any neck pain, back pain, headache, visual changes, pain, nausea, vomiting, abdominal pain. Tetanus is up to date. History provided by patient with additional history obtained from chart review.     Past Medical History:  Diagnosis Date   Arthritis    incl spinal stenosis   Atrial fibrillation (HCC)    BPH (benign prostatic hyperplasia)    CHF (congestive heart failure) (HCC)    CHF (congestive heart failure) (HCC)    Dementia (HCC)    Dysphagia    HTN (hypertension)    Hyperlipidemia    Parkinson disease ([place])    Renal insufficiency    question degree   Spinal stenosis    Thyroid disease    Wears glasses    reading   Wears partial dentures    upper partial    Patient Active Problem List   Diagnosis Date Noted   CHF (congestive heart failure) ([place])    Hypertension 09/17/2012   Pneumonia 12/03/2011   Syncope 10/13/2011   ATRIAL FIBRILLATION 02/19/2010   RENAL INSUFFICIENCY 02/19/2010   PACEMAKER, single chamber--[employer] 02/19/2010    Past Surgical History:  Procedure Laterality Date   bilateral shoulder surgery     EYE SURGERY     both catararcts   I&D EXTREMITY  12/07/2011   Procedure: IRRIGATION AND DEBRIDEMENT EXTREMITY;  Surgeon: Tennis Must, MD;  Location: [facility] ORS;  Service: Orthopedics;  Laterality: Left;   PACEMAKER GENERATOR CHANGE N/A 11/28/2013   Procedure: PACEMAKER GENERATOR CHANGE;  Surgeon: Deboraha Sprang, MD;  Location: [facility] CATH LAB;  Service: Cardiovascular;  Laterality: N/A;   PACEMAKER INSERTION     BSX pacemaker generator change 11-28-2013 by Dr Caryl Comes   prosthetic hip - bilaterally     R knee quadricep tendon repair     TONSILLECTOMY     TRIGGER FINGER RELEASE Right 05/26/2013   Procedure: RIGHT SMALL TRIGGER RELEASE ;  Surgeon: Tennis Must, MD;  Location: [facility];  Service: Orthopedics;  Laterality: Right;        Home Medications    Prior to Admission medications   Medication Sig Start Date End Date Taking? Authorizing Provider  acetaminophen (TYLENOL) 500 MG tablet Take 1,000 mg by mouth 3 (three) times daily. scheduled    Yes [provider]  ASPERCREME LIDOCAINE EX Apply 1 application topically 2 (two) times daily.    Yes [provider]  carbidopa-levodopa (SINEMET IR) 25-100 MG tablet Take 1 tablet by mouth 3 (three) times daily.   Yes [provider]  carvedilol (COREG) 3.125 MG tablet Take 1 tablet by mouth 2 (two) times daily. 08/13/18  Yes [provider]  Cholecalciferol (VITAMIN D-3) 5000 UNITS TABS Take 5,000 Units by mouth daily.   Yes [provider]  docusate sodium (COLACE) 100 MG capsule Take 100 mg by mouth daily. 07/22/16  Yes [provider]  finasteride (PROSCAR) 5 MG tablet Take 5 mg by mouth daily.    Yes [provider]  furosemide (LASIX) 20 MG tablet Take 20 mg by mouth daily.   Yes [provider]  levothyroxine (SYNTHROID, LEVOTHROID) 25 MCG tablet Take 1 tablet by mouth daily. 08/24/18  Yes [provider]  lisinopril (PRINIVIL,ZESTRIL) 5 MG tablet Take 1 tablet by mouth daily. 09/09/18  Yes [provider]  oxybutynin (DITROPAN-XL) 5 MG 24 hr tablet Take 5 mg by mouth daily.    Yes [provider]  Polyethyl Glycol-Propyl Glycol 0.4-0.3 % SOLN Apply 1 drop to eye 2 (two) times daily.   Yes [provider]  Polyethylene Glycol 3350 (MIRALAX PO) Take 1 Dose by mouth 3 (three) times a week. Takes on M/W/F   Yes [provider]  pravastatin (PRAVACHOL) 20 MG tablet Take 1 tablet by mouth daily. 08/19/18  Yes [provider]  rasagiline (AZILECT) 0.5 MG TABS tablet Take 1 tablet by mouth daily. 08/20/18  Yes [provider]  Rivaroxaban (XARELTO) 15 MG TABS tablet Take 1 tablet (15 mg total) by mouth daily with supper. 09/13/18  Yes Deboraha Sprang, MD  senna (SENOKOT) 8.6 MG TABS tablet Take 1 tablet by mouth 2 (two) times daily.   Yes [provider]  sertraline (ZOLOFT) 50 MG tablet Take 50 mg by mouth daily.   Yes [provider]  Tamsulosin HCl (FLOMAX) 0.4 MG CAPS Take 0.4 mg by mouth 2 (two) times daily.    Yes [provider]  traMADol (ULTRAM) 50 MG tablet Take 100 mg by mouth 2 (two) times daily. scheduled   Yes [provider]  amoxicillin (AMOXIL) 500 MG capsule Take 500 mg by mouth See admin instructions. Take 4 capsules (2000 mg) by mouth prior to dental appointment    [provider]  amoxicillin-clavulanate (AUGMENTIN) 875-125 MG tablet Take 1 tablet by mouth every 12 (twelve) hours for 7 days. 05/24/19 05/31/19  Shakura Cowing E, [name]  Ascorbic Acid (VITAMIN C) 1000 MG tablet Take 1,000 mg by mouth daily.    [provider]  b complex vitamins tablet Take 1 tablet by mouth daily.    [provider]  carbidopa-levodopa (SINEMET IR) 25-250 MG tablet Take 1 tablet by mouth daily. 08/20/18   [provider]  fluticasone (VERAMYST) 27.5 MCG/SPRAY nasal spray Place 2 sprays into the nose daily.    [provider]  ipratropium-albuterol (DUONEB) 0.5-2.5 (3) MG/3ML SOLN  Inhale 1 mL into the lungs every 6 (six) hours as needed for wheezing. 09/04/18   [provider]  Mouthwashes (BIOTENE DRY MOUTH MT) Use as directed 10 mLs in the mouth or throat daily as needed (dry mouth).    [provider]  Omega-3 Fatty Acids (FISH OIL) 1200 MG CAPS Take 2,400 mg by mouth 2 (two) times daily.    [provider]  polyvinyl alcohol (LIQUIFILM TEARS) 1.4 % ophthalmic solution Place 1 drop into both eyes 5 (five) times daily as needed for dry eyes.     [provider]  Probiotic Product (PROBIOTIC DAILY PO) Take 1 capsule by mouth daily.    [provider]  RESTASIS 0.05 % ophthalmic emulsion Place 1 drop into both eyes 2 (  two) times daily. 07/05/18   [provider]  traZODone (DESYREL) 50 MG tablet Take 50 mg by mouth at bedtime. 05/19/19   [provider]    Family History History reviewed. No pertinent family history.  Social History Social History   Tobacco Use   Smoking status: Former Smoker    Types: Cigarettes    Quit date: 05/13/1971    Years since quitting: 48.0   Smokeless tobacco: Never Used  Substance Use Topics   Alcohol use: No    Frequency: Never    Comment: occasional   Drug use: No     Allergies   Amiodarone   Review of Systems Review of Systems  Constitutional: Negative for chills and fever.  HENT: Negative for congestion, rhinorrhea, sinus pressure and sore throat.   Eyes: Negative for pain and redness.  Respiratory: Negative for cough, shortness of breath and wheezing.   Cardiovascular: Negative for chest pain and palpitations.  Gastrointestinal: Negative for abdominal pain, constipation, diarrhea, nausea and vomiting.  Genitourinary: Negative for dysuria.  Musculoskeletal: Positive for arthralgias. Negative for back pain, myalgias and neck pain.  Skin: Positive for wound. Negative for rash.  Neurological: Negative for dizziness, syncope, weakness, numbness and  headaches.  Psychiatric/Behavioral: Negative for confusion.     Physical Exam Updated Vital Signs BP (!) 159/66    Pulse (!) 59    Temp 97.9 F (36.6 C) (Oral)    Resp 16    SpO2 98%   Physical Exam Vitals signs and nursing note reviewed.  Constitutional:      General: He is not in acute distress.    Appearance: He is not ill-appearing.  HENT:     Head: Normocephalic. No raccoon eyes or Battle's sign.     Jaw: There is normal jaw occlusion.      Right Ear: Tympanic membrane and external ear normal. No hemotympanum.     Left Ear: Tympanic membrane and external ear normal. No hemotympanum.     Nose: No nasal deformity or nasal tenderness.     Right Nostril: No septal hematoma.     Left Nostril: No septal hematoma.     Mouth/Throat:     Mouth: Mucous membranes are moist. No injury.     Pharynx: Oropharynx is clear.  Eyes:     General: No scleral icterus.       Right eye: No discharge.        Left eye: No discharge.     Extraocular Movements: Extraocular movements intact.     Conjunctiva/sclera: Conjunctivae normal.     Pupils: Pupils are equal, round, and reactive to light.  Neck:     Vascular: No JVD.     Comments: Pt wearing cervical collar.  Unable to assess ROM. No step offs or deformities felt. Cardiovascular:     Rate and Rhythm: Normal rate and regular rhythm.     Pulses: Normal pulses.          Radial pulses are 2+ on the right side and 2+ on the left side.     Heart sounds: Normal heart sounds.  Pulmonary:     Comments: Lungs clear to auscultation in all fields. Symmetric chest rise. No wheezing, rales, or rhonchi. Abdominal:     Comments: Abdomen is soft, non-distended, and non-tender in all quadrants. No rigidity, no guarding. No peritoneal signs.  Musculoskeletal: Normal range of motion.     Right knee: Normal.     Right ankle: Normal.  Comments: No cervical, thoracic, or lumbar spinal tenderness to palpation. No paraspinal tenderness. No step offs,  crepitus or deformity palpated. Pelvis is stable. Small superficial tear to right tib-fib, no bleeding.  Skin:    General: Skin is warm and dry.     Capillary Refill: Capillary refill takes less than 2 seconds.  Neurological:     Mental Status: He is oriented to person, place, and time.     GCS: GCS eye subscore is 4. GCS verbal subscore is 5. GCS motor subscore is 6.     Comments: Fluent speech, no facial droop. Speech is clear and goal oriented, follows commands CN III-XII intact, no facial droop Normal strength in upper and lower extremities bilaterally including dorsiflexion and plantar flexion, strong and equal grip strength Sensation normal to light and sharp touch Moves extremities without ataxia, coordination intact    Psychiatric:        Behavior: Behavior normal.      ED Treatments / Results  Labs (all labs ordered are listed, but only abnormal results are displayed) Labs Reviewed  BASIC METABOLIC PANEL - Abnormal; Notable for the following components:      Result Value   Glucose, Bld 112 (*)    BUN 28 (*)    Calcium 8.5 (*)    GFR calc non Af Amer 56 (*)    All other components within normal limits  CBC - Abnormal; Notable for the following components:   RBC 3.56 (*)    Hemoglobin 11.4 (*)    HCT 35.6 (*)    Platelets 139 (*)    All other components within normal limits  URINALYSIS, ROUTINE W REFLEX MICROSCOPIC - Abnormal; Notable for the following components:   Leukocytes,Ua MODERATE (*)    WBC, UA >50 (*)    Bacteria, UA FEW (*)    All other components within normal limits  CBG MONITORING, ED - Abnormal; Notable for the following components:   Glucose-Capillary 100 (*)    All other components within normal limits  URINE CULTURE    EKG   EKG Interpretation  Date/Time:  Tuesday May 24 2019 18:38:26 EDT Ventricular Rate:  60 PR Interval:    QRS Duration: 150 QT Interval:  468 QTC Calculation: 468 R Axis:   66 Text Interpretation: Atrial  fibrillation Left bundle branch block When compared with ECG of 03/29/2018, No significant change was found Confirmed by Delora Fuel ([id number]) on 05/25/2019 12:17:05 AM       Radiology Dg Tibia/fibula Right  Result Date: 05/24/2019 CLINICAL DATA:  Fall with lower extremity lacerations EXAM: RIGHT TIBIA AND FIBULA - 2 VIEW COMPARISON:  None. FINDINGS: There is no evidence of fracture or other focal bone lesions. Soft tissues are unremarkable. Extensive vascular calcification. IMPRESSION: Negative. Electronically Signed   By: Ulyses Jarred M.D.   On: 05/24/2019 19:53   Ct Head Wo Contrast  Result Date: 05/24/2019 CLINICAL DATA:  Fall with headache and neck pain. Anticoagulated on Xarelto. EXAM: CT HEAD WITHOUT CONTRAST CT CERVICAL SPINE WITHOUT CONTRAST TECHNIQUE: Multidetector CT imaging of the head and cervical spine was performed following the standard protocol without intravenous contrast. Multiplanar CT image reconstructions of the cervical spine were also generated. COMPARISON:  11/04/2017 head and cervical spine CT. FINDINGS: CT HEAD FINDINGS Brain: No evidence of parenchymal hemorrhage or extra-axial fluid collection. No mass lesion, mass effect, or midline shift. No CT evidence of acute infarction. Nonspecific mild subcortical and periventricular white matter hypodensity, most in keeping with chronic small  vessel ischemic change. Generalized cerebral volume loss. Cerebral ventricle sizes are stable and concordant with the degree of cerebral volume loss. Vascular: No acute abnormality. Skull: No evidence of calvarial fracture. Sinuses/Orbits: The visualized paranasal sinuses are essentially clear. Other:  The mastoid air cells are unopacified. CT CERVICAL SPINE FINDINGS Alignment: Normal cervical lordosis. No facet subluxation. Dens is well positioned between the lateral masses of C1. Chronic mild 2 mm anterolisthesis at C4-5, 3 mm anterolisthesis at C5-6 and 3 mm anterolisthesis at C7-T1. Skull  base and vertebrae: No acute fracture. No primary bone lesion or focal pathologic process. Soft tissues and spinal canal: No prevertebral edema. No visible canal hematoma. Disc levels: Moderate to marked multilevel degenerative disc disease in the cervical spine, most prominent at C6-7. Advanced bilateral facet arthropathy. Mild to moderate degenerative foraminal stenosis on the right at C2-3 and C3-4. Upper chest: No acute abnormality. Other: Visualized mastoid air cells appear clear. No discrete thyroid nodules. No pathologically enlarged cervical nodes. IMPRESSION: 1. No evidence of acute intracranial abnormality. No evidence of calvarial fracture. 2. Generalized cerebral volume loss and mild chronic small vessel ischemic changes in the cerebral white matter. 3. No cervical spine fracture or facet subluxation. 4. Moderate to marked multilevel degenerative changes in the cervical spine as detailed. Electronically Signed   By: Ilona Sorrel M.D.   On: 05/24/2019 19:50   Ct Cervical Spine Wo Contrast  Result Date: 05/24/2019 CLINICAL DATA:  Fall with headache and neck pain. Anticoagulated on Xarelto. EXAM: CT HEAD WITHOUT CONTRAST CT CERVICAL SPINE WITHOUT CONTRAST TECHNIQUE: Multidetector CT imaging of the head and cervical spine was performed following the standard protocol without intravenous contrast. Multiplanar CT image reconstructions of the cervical spine were also generated. COMPARISON:  11/04/2017 head and cervical spine CT. FINDINGS: CT HEAD FINDINGS Brain: No evidence of parenchymal hemorrhage or extra-axial fluid collection. No mass lesion, mass effect, or midline shift. No CT evidence of acute infarction. Nonspecific mild subcortical and periventricular white matter hypodensity, most in keeping with chronic small vessel ischemic change. Generalized cerebral volume loss. Cerebral ventricle sizes are stable and concordant with the degree of cerebral volume loss. Vascular: No acute abnormality. Skull:  No evidence of calvarial fracture. Sinuses/Orbits: The visualized paranasal sinuses are essentially clear. Other:  The mastoid air cells are unopacified. CT CERVICAL SPINE FINDINGS Alignment: Normal cervical lordosis. No facet subluxation. Dens is well positioned between the lateral masses of C1. Chronic mild 2 mm anterolisthesis at C4-5, 3 mm anterolisthesis at C5-6 and 3 mm anterolisthesis at C7-T1. Skull base and vertebrae: No acute fracture. No primary bone lesion or focal pathologic process. Soft tissues and spinal canal: No prevertebral edema. No visible canal hematoma. Disc levels: Moderate to marked multilevel degenerative disc disease in the cervical spine, most prominent at C6-7. Advanced bilateral facet arthropathy. Mild to moderate degenerative foraminal stenosis on the right at C2-3 and C3-4. Upper chest: No acute abnormality. Other: Visualized mastoid air cells appear clear. No discrete thyroid nodules. No pathologically enlarged cervical nodes. IMPRESSION: 1. No evidence of acute intracranial abnormality. No evidence of calvarial fracture. 2. Generalized cerebral volume loss and mild chronic small vessel ischemic changes in the cerebral white matter. 3. No cervical spine fracture or facet subluxation. 4. Moderate to marked multilevel degenerative changes in the cervical spine as detailed. Electronically Signed   By: Ilona Sorrel M.D.   On: 05/24/2019 19:50    Procedures .Marland KitchenLaceration Repair  Date/Time: 05/24/2019 10:40 PM Performed by: Cherre Robins, [name] Authorized by:  Safiya Girdler, Harley Hallmark, [name]   Consent:    Consent obtained:  Verbal and written   Consent given by:  Patient   Risks discussed:  Infection, pain and retained foreign body   Alternatives discussed:  No treatment Anesthesia (see MAR for exact dosages):    Anesthesia method:  Topical application   Topical anesthetic:  LET Laceration details:    Location:  Scalp   Scalp location:  L temporal   Wound length (cm): L  shaped, 3 cm in length. Repair type:    Repair type:  Simple Pre-procedure details:    Preparation:  Patient was prepped and draped in usual sterile fashion and imaging obtained to evaluate for foreign bodies Exploration:    Hemostasis achieved with:  LET   Wound exploration: wound explored through full range of motion and entire depth of wound probed and visualized     Contaminated: no   Treatment:    Area cleansed with:  Saline   Amount of cleaning:  Standard   Irrigation solution:  Sterile saline   Irrigation volume:  1 L   Irrigation method:  Syringe   Visualized foreign bodies/material removed: no   Skin repair:    Repair method:  Sutures and Steri-Strips   Suture size:  5-0   Suture material:  Prolene   Suture technique:  Simple interrupted   Number of sutures:  3   Number of Steri-Strips:  3 Approximation:    Approximation:  Close Post-procedure details:    Dressing:  Non-adherent dressing   Patient tolerance of procedure:  Tolerated well, no immediate complications   (including critical care time)  Medications Ordered in ED Medications  sodium chloride flush (NS) 0.9 % injection 3 mL (3 mLs Intravenous Given 05/24/19 1851)  lidocaine-EPINEPHrine-tetracaine (LET) solution (3 mLs Topical Given 05/24/19 2123)  lidocaine-EPINEPHrine (XYLOCAINE W/EPI) 2 %-1:200000 (PF) injection 20 mL (20 mLs Infiltration Given 05/24/19 2123)  amoxicillin-clavulanate (AUGMENTIN) 875-125 MG per tablet 1 tablet (1 tablet Oral Given 05/24/19 2311)     Initial Impression / Assessment and Plan / ED Course  I have reviewed the triage vital signs and the nursing notes.  Pertinent labs & imaging results that were available during my care of the patient were reviewed by me and considered in my medical decision making (see chart for details).  Patient seen and examined. Patient nontoxic appearing, in no apparent distress, vitals WNL.  He is alert and oriented.  He arrives in cervical collar and  has L shaped gaping laceration above left eyebrow. Pt palpated from head to toe and only has tenderness to laceration and right tib fib. As patient reported possible syncope after falling and hitting his head labs and UA were ordered in triage. EKG is unchanged from prior.   Pelvis is stable. CN 2-12 intact, no facial droop.  CT head and cervical spine are negative for acute findings.  C-spine is clear and cervical collar removed.  X-ray of right tib-fib by me is negative for any fracture or dislocations.  Attempted to contact at [facility] multiple times without success to provide collateral history.  I reviewed labs.  CBC and BMP overall unremarkable.  UA with moderate leukocytes, over 50 WBC, few bacteria, nitrite negative.  Urine culture sent. Discussed with pharmacy who recommends Augmentin as patient has grown Enterococcus and his UA today has negative nitrites. The Augmentin will cover Enterococcus and gram-negative bacteria.    Laceration with pressure irrigation performed. Wound explored and base of wound visualized in  a bloodless field without evidence of foreign body. Laceration repair per procedure note above, tolerated well.  Tetanus is up-to-date. Discussed suture home care as well as need for wound recheck and suture removal in 7 days. I discussed results, treatment plan, need for follow-up, and return precautions with the patient including signs of infection. Provided opportunity for questions, patient confirmed understanding and is in agreement with plan. The patient was discussed with and seen by Dr. Sherry Ruffing who agrees with the treatment plan.    Portions of this note were generated with [employer]. Dictation errors may occur despite best attempts at proofreading.  Final Clinical Impressions(s) / ED Diagnoses   Final diagnoses:  Fall, initial encounter  Laceration of scalp, initial encounter  Acute cystitis without hematuria    ED Discharge Orders          Ordered    amoxicillin-clavulanate (AUGMENTIN) 875-125 MG tablet  Every 12 hours     05/24/19 2255           Cherre Robins, [name] 05/25/19 0029    Tegeler, Gwenyth Allegra, MD 05/25/19 1207

## 2019-05-24 NOTE — Discharge Instructions (Addendum)
1. Medications: Tylenol or ibuprofen for pain, usual home medications -Prescription sent to your pharmacy for Augmentin.  This is an antibiotic used to treat your urinary tract infection.  These take as prescribed.  2. Treatment: ice for swelling, keep wound clean with warm soap and water and keep bandage dry, do not submerge in water for 24 hours  3. Follow Up: Please return in 7 days to have your stitches/staples removed or sooner if you have concerns. Return to the emergency department for increased redness, drainage of pus from the wound   WOUND CARE  Keep area clean and dry for 24 hours. Do not remove bandage, if applied.  After 24 hours, remove bandage and wash wound gently with mild soap and warm water. Reapply a new bandage after cleaning wound, if directed.   Continue daily cleansing with soap and water until stitches/staples are removed.  Do not apply any ointments or creams to the wound while stitches/staples are in place, as this may cause delayed healing. Return if you experience any of the following signs of infection: Swelling, redness, pus drainage, streaking, fever >101.0 F  Return if you experience excessive bleeding that does not stop after 15-20 minutes of constant, firm pressure.

## 2019-05-24 NOTE — ED Notes (Signed)
Dressing applied to wound.  

## 2019-05-25 DIAGNOSIS — W19XXXD Unspecified fall, subsequent encounter: Secondary | ICD-10-CM | POA: Diagnosis not present

## 2019-05-25 DIAGNOSIS — N3000 Acute cystitis without hematuria: Secondary | ICD-10-CM | POA: Diagnosis not present

## 2019-05-25 DIAGNOSIS — S0181XA Laceration without foreign body of other part of head, initial encounter: Secondary | ICD-10-CM | POA: Diagnosis not present

## 2019-05-25 NOTE — ED Notes (Signed)
Pt clothes in belongings bag.

## 2019-05-25 NOTE — ED Notes (Signed)
Patient verbalizes understanding of discharge instructions. Opportunity for questioning and answers were provided. Armband removed by staff, pt discharged from ED ambulatory.   

## 2019-05-26 DIAGNOSIS — G20 Parkinson's disease: Secondary | ICD-10-CM | POA: Diagnosis not present

## 2019-05-26 DIAGNOSIS — R2689 Other abnormalities of gait and mobility: Secondary | ICD-10-CM | POA: Diagnosis not present

## 2019-05-26 DIAGNOSIS — N3000 Acute cystitis without hematuria: Secondary | ICD-10-CM | POA: Diagnosis not present

## 2019-05-26 DIAGNOSIS — Z9181 History of falling: Secondary | ICD-10-CM | POA: Diagnosis not present

## 2019-05-26 DIAGNOSIS — M25519 Pain in unspecified shoulder: Secondary | ICD-10-CM | POA: Diagnosis not present

## 2019-05-26 DIAGNOSIS — R1312 Dysphagia, oropharyngeal phase: Secondary | ICD-10-CM | POA: Diagnosis not present

## 2019-05-26 DIAGNOSIS — M6281 Muscle weakness (generalized): Secondary | ICD-10-CM | POA: Diagnosis not present

## 2019-05-26 DIAGNOSIS — R279 Unspecified lack of coordination: Secondary | ICD-10-CM | POA: Diagnosis not present

## 2019-05-26 DIAGNOSIS — R488 Other symbolic dysfunctions: Secondary | ICD-10-CM | POA: Diagnosis not present

## 2019-05-26 DIAGNOSIS — N400 Enlarged prostate without lower urinary tract symptoms: Secondary | ICD-10-CM | POA: Diagnosis not present

## 2019-05-26 DIAGNOSIS — R2681 Unsteadiness on feet: Secondary | ICD-10-CM | POA: Diagnosis not present

## 2019-05-26 DIAGNOSIS — R42 Dizziness and giddiness: Secondary | ICD-10-CM | POA: Diagnosis not present

## 2019-05-27 DIAGNOSIS — R2681 Unsteadiness on feet: Secondary | ICD-10-CM | POA: Diagnosis not present

## 2019-05-27 DIAGNOSIS — Z9181 History of falling: Secondary | ICD-10-CM | POA: Diagnosis not present

## 2019-05-27 DIAGNOSIS — R2689 Other abnormalities of gait and mobility: Secondary | ICD-10-CM | POA: Diagnosis not present

## 2019-05-27 DIAGNOSIS — R488 Other symbolic dysfunctions: Secondary | ICD-10-CM | POA: Diagnosis not present

## 2019-05-27 DIAGNOSIS — R1312 Dysphagia, oropharyngeal phase: Secondary | ICD-10-CM | POA: Diagnosis not present

## 2019-05-27 DIAGNOSIS — G20 Parkinson's disease: Secondary | ICD-10-CM | POA: Diagnosis not present

## 2019-05-28 DIAGNOSIS — R488 Other symbolic dysfunctions: Secondary | ICD-10-CM | POA: Diagnosis not present

## 2019-05-28 DIAGNOSIS — R2681 Unsteadiness on feet: Secondary | ICD-10-CM | POA: Diagnosis not present

## 2019-05-28 DIAGNOSIS — G20 Parkinson's disease: Secondary | ICD-10-CM | POA: Diagnosis not present

## 2019-05-28 DIAGNOSIS — R2689 Other abnormalities of gait and mobility: Secondary | ICD-10-CM | POA: Diagnosis not present

## 2019-05-28 DIAGNOSIS — Z9181 History of falling: Secondary | ICD-10-CM | POA: Diagnosis not present

## 2019-05-28 DIAGNOSIS — R1312 Dysphagia, oropharyngeal phase: Secondary | ICD-10-CM | POA: Diagnosis not present

## 2019-05-28 LAB — URINE CULTURE: Culture: 60000 — AB

## 2019-05-29 ENCOUNTER — Telehealth: Payer: Self-pay | Admitting: *Deleted

## 2019-05-29 NOTE — Telephone Encounter (Signed)
Post ED Visit - Positive Culture Follow-up  Culture report reviewed by antimicrobial stewardship pharmacist: [place] Team []  Elenor Quinones, Pharm.D. []  Heide Guile, Pharm.D., BCPS AQ-ID []  Parks Neptune, Pharm.D., BCPS []  Alycia Rossetti, Pharm.D., BCPS []  [place], Pharm.D., BCPS, AAHIVP []  Legrand Como, Pharm.D., BCPS, AAHIVP []  Salome Arnt, PharmD, BCPS []  Johnnette Gourd, PharmD, BCPS []  Hughes Better, PharmD, BCPS []  Leeroy Cha, PharmD []  Laqueta Linden, PharmD, BCPS []  Albertina Parr, PharmD Antonietta Jewel, PharmD  [place] Team []  Leodis Sias, PharmD []  Lindell Spar, PharmD []  Royetta Asal, PharmD []  Graylin Shiver, Rph []  Rema Fendt) Glennon Mac, PharmD []  Arlyn Dunning, PharmD []  Netta Cedars, PharmD []  Dia Sitter, PharmD []  Leone [place], PharmD []  Gretta Arab, PharmD []  Theodis Shove, PharmD []  Peggyann Juba, PharmD []  Reuel Boom, PharmD   Positive urine culture Treated with Amoxicillin-Pot Clavulanate, organism sensitive to the same and no further patient follow-up is required at this time.  Harlon Flor [facility] 05/29/2019, 10:44 AM

## 2019-05-30 DIAGNOSIS — G20 Parkinson's disease: Secondary | ICD-10-CM | POA: Diagnosis not present

## 2019-05-30 DIAGNOSIS — R279 Unspecified lack of coordination: Secondary | ICD-10-CM | POA: Diagnosis not present

## 2019-05-30 DIAGNOSIS — R2689 Other abnormalities of gait and mobility: Secondary | ICD-10-CM | POA: Diagnosis not present

## 2019-05-30 DIAGNOSIS — R2681 Unsteadiness on feet: Secondary | ICD-10-CM | POA: Diagnosis not present

## 2019-05-30 DIAGNOSIS — Z9181 History of falling: Secondary | ICD-10-CM | POA: Diagnosis not present

## 2019-05-30 DIAGNOSIS — M6281 Muscle weakness (generalized): Secondary | ICD-10-CM | POA: Diagnosis not present

## 2019-05-30 DIAGNOSIS — R488 Other symbolic dysfunctions: Secondary | ICD-10-CM | POA: Diagnosis not present

## 2019-05-30 DIAGNOSIS — M25519 Pain in unspecified shoulder: Secondary | ICD-10-CM | POA: Diagnosis not present

## 2019-05-30 DIAGNOSIS — U071 COVID-19: Secondary | ICD-10-CM | POA: Diagnosis not present

## 2019-05-30 DIAGNOSIS — R1312 Dysphagia, oropharyngeal phase: Secondary | ICD-10-CM | POA: Diagnosis not present

## 2019-05-31 DIAGNOSIS — R488 Other symbolic dysfunctions: Secondary | ICD-10-CM | POA: Diagnosis not present

## 2019-05-31 DIAGNOSIS — G4709 Other insomnia: Secondary | ICD-10-CM | POA: Diagnosis not present

## 2019-05-31 DIAGNOSIS — F0390 Unspecified dementia without behavioral disturbance: Secondary | ICD-10-CM | POA: Diagnosis not present

## 2019-05-31 DIAGNOSIS — F330 Major depressive disorder, recurrent, mild: Secondary | ICD-10-CM | POA: Diagnosis not present

## 2019-05-31 DIAGNOSIS — Z9181 History of falling: Secondary | ICD-10-CM | POA: Diagnosis not present

## 2019-05-31 DIAGNOSIS — R2689 Other abnormalities of gait and mobility: Secondary | ICD-10-CM | POA: Diagnosis not present

## 2019-05-31 DIAGNOSIS — F329 Major depressive disorder, single episode, unspecified: Secondary | ICD-10-CM | POA: Diagnosis not present

## 2019-05-31 DIAGNOSIS — G20 Parkinson's disease: Secondary | ICD-10-CM | POA: Diagnosis not present

## 2019-05-31 DIAGNOSIS — R1312 Dysphagia, oropharyngeal phase: Secondary | ICD-10-CM | POA: Diagnosis not present

## 2019-05-31 DIAGNOSIS — M25519 Pain in unspecified shoulder: Secondary | ICD-10-CM | POA: Diagnosis not present

## 2019-06-01 DIAGNOSIS — G20 Parkinson's disease: Secondary | ICD-10-CM | POA: Diagnosis not present

## 2019-06-01 DIAGNOSIS — R1312 Dysphagia, oropharyngeal phase: Secondary | ICD-10-CM | POA: Diagnosis not present

## 2019-06-01 DIAGNOSIS — Z9181 History of falling: Secondary | ICD-10-CM | POA: Diagnosis not present

## 2019-06-01 DIAGNOSIS — R488 Other symbolic dysfunctions: Secondary | ICD-10-CM | POA: Diagnosis not present

## 2019-06-01 DIAGNOSIS — S0181XA Laceration without foreign body of other part of head, initial encounter: Secondary | ICD-10-CM | POA: Diagnosis not present

## 2019-06-01 DIAGNOSIS — M25519 Pain in unspecified shoulder: Secondary | ICD-10-CM | POA: Diagnosis not present

## 2019-06-01 DIAGNOSIS — R2689 Other abnormalities of gait and mobility: Secondary | ICD-10-CM | POA: Diagnosis not present

## 2019-06-01 DIAGNOSIS — N3000 Acute cystitis without hematuria: Secondary | ICD-10-CM | POA: Diagnosis not present

## 2019-06-02 DIAGNOSIS — N3000 Acute cystitis without hematuria: Secondary | ICD-10-CM | POA: Diagnosis not present

## 2019-06-02 DIAGNOSIS — R2689 Other abnormalities of gait and mobility: Secondary | ICD-10-CM | POA: Diagnosis not present

## 2019-06-02 DIAGNOSIS — M25519 Pain in unspecified shoulder: Secondary | ICD-10-CM | POA: Diagnosis not present

## 2019-06-02 DIAGNOSIS — R488 Other symbolic dysfunctions: Secondary | ICD-10-CM | POA: Diagnosis not present

## 2019-06-02 DIAGNOSIS — Z9181 History of falling: Secondary | ICD-10-CM | POA: Diagnosis not present

## 2019-06-02 DIAGNOSIS — G20 Parkinson's disease: Secondary | ICD-10-CM | POA: Diagnosis not present

## 2019-06-02 DIAGNOSIS — Z79899 Other long term (current) drug therapy: Secondary | ICD-10-CM | POA: Diagnosis not present

## 2019-06-02 DIAGNOSIS — R1312 Dysphagia, oropharyngeal phase: Secondary | ICD-10-CM | POA: Diagnosis not present

## 2019-06-03 DIAGNOSIS — M25519 Pain in unspecified shoulder: Secondary | ICD-10-CM | POA: Diagnosis not present

## 2019-06-03 DIAGNOSIS — R2689 Other abnormalities of gait and mobility: Secondary | ICD-10-CM | POA: Diagnosis not present

## 2019-06-03 DIAGNOSIS — G20 Parkinson's disease: Secondary | ICD-10-CM | POA: Diagnosis not present

## 2019-06-03 DIAGNOSIS — R1312 Dysphagia, oropharyngeal phase: Secondary | ICD-10-CM | POA: Diagnosis not present

## 2019-06-03 DIAGNOSIS — S0181XA Laceration without foreign body of other part of head, initial encounter: Secondary | ICD-10-CM | POA: Diagnosis not present

## 2019-06-03 DIAGNOSIS — W19XXXD Unspecified fall, subsequent encounter: Secondary | ICD-10-CM | POA: Diagnosis not present

## 2019-06-03 DIAGNOSIS — Z9181 History of falling: Secondary | ICD-10-CM | POA: Diagnosis not present

## 2019-06-03 DIAGNOSIS — N3000 Acute cystitis without hematuria: Secondary | ICD-10-CM | POA: Diagnosis not present

## 2019-06-03 DIAGNOSIS — I110 Hypertensive heart disease with heart failure: Secondary | ICD-10-CM | POA: Diagnosis not present

## 2019-06-03 DIAGNOSIS — R488 Other symbolic dysfunctions: Secondary | ICD-10-CM | POA: Diagnosis not present

## 2019-06-04 DIAGNOSIS — Z9181 History of falling: Secondary | ICD-10-CM | POA: Diagnosis not present

## 2019-06-04 DIAGNOSIS — R1312 Dysphagia, oropharyngeal phase: Secondary | ICD-10-CM | POA: Diagnosis not present

## 2019-06-04 DIAGNOSIS — R2689 Other abnormalities of gait and mobility: Secondary | ICD-10-CM | POA: Diagnosis not present

## 2019-06-04 DIAGNOSIS — M25519 Pain in unspecified shoulder: Secondary | ICD-10-CM | POA: Diagnosis not present

## 2019-06-04 DIAGNOSIS — R488 Other symbolic dysfunctions: Secondary | ICD-10-CM | POA: Diagnosis not present

## 2019-06-04 DIAGNOSIS — G20 Parkinson's disease: Secondary | ICD-10-CM | POA: Diagnosis not present

## 2019-06-05 DIAGNOSIS — U071 COVID-19: Secondary | ICD-10-CM | POA: Diagnosis not present

## 2019-06-06 DIAGNOSIS — R2689 Other abnormalities of gait and mobility: Secondary | ICD-10-CM | POA: Diagnosis not present

## 2019-06-06 DIAGNOSIS — R1312 Dysphagia, oropharyngeal phase: Secondary | ICD-10-CM | POA: Diagnosis not present

## 2019-06-06 DIAGNOSIS — G20 Parkinson's disease: Secondary | ICD-10-CM | POA: Diagnosis not present

## 2019-06-06 DIAGNOSIS — R488 Other symbolic dysfunctions: Secondary | ICD-10-CM | POA: Diagnosis not present

## 2019-06-06 DIAGNOSIS — M25519 Pain in unspecified shoulder: Secondary | ICD-10-CM | POA: Diagnosis not present

## 2019-06-06 DIAGNOSIS — Z9181 History of falling: Secondary | ICD-10-CM | POA: Diagnosis not present

## 2019-06-07 DIAGNOSIS — G20 Parkinson's disease: Secondary | ICD-10-CM | POA: Diagnosis not present

## 2019-06-07 DIAGNOSIS — M25519 Pain in unspecified shoulder: Secondary | ICD-10-CM | POA: Diagnosis not present

## 2019-06-07 DIAGNOSIS — R2689 Other abnormalities of gait and mobility: Secondary | ICD-10-CM | POA: Diagnosis not present

## 2019-06-07 DIAGNOSIS — Z9181 History of falling: Secondary | ICD-10-CM | POA: Diagnosis not present

## 2019-06-07 DIAGNOSIS — R1312 Dysphagia, oropharyngeal phase: Secondary | ICD-10-CM | POA: Diagnosis not present

## 2019-06-07 DIAGNOSIS — R488 Other symbolic dysfunctions: Secondary | ICD-10-CM | POA: Diagnosis not present

## 2019-06-08 DIAGNOSIS — Z9181 History of falling: Secondary | ICD-10-CM | POA: Diagnosis not present

## 2019-06-08 DIAGNOSIS — G20 Parkinson's disease: Secondary | ICD-10-CM | POA: Diagnosis not present

## 2019-06-08 DIAGNOSIS — M25519 Pain in unspecified shoulder: Secondary | ICD-10-CM | POA: Diagnosis not present

## 2019-06-08 DIAGNOSIS — R2689 Other abnormalities of gait and mobility: Secondary | ICD-10-CM | POA: Diagnosis not present

## 2019-06-08 DIAGNOSIS — R1312 Dysphagia, oropharyngeal phase: Secondary | ICD-10-CM | POA: Diagnosis not present

## 2019-06-08 DIAGNOSIS — R488 Other symbolic dysfunctions: Secondary | ICD-10-CM | POA: Diagnosis not present

## 2019-06-09 DIAGNOSIS — G20 Parkinson's disease: Secondary | ICD-10-CM | POA: Diagnosis not present

## 2019-06-09 DIAGNOSIS — R2689 Other abnormalities of gait and mobility: Secondary | ICD-10-CM | POA: Diagnosis not present

## 2019-06-09 DIAGNOSIS — R1312 Dysphagia, oropharyngeal phase: Secondary | ICD-10-CM | POA: Diagnosis not present

## 2019-06-09 DIAGNOSIS — R488 Other symbolic dysfunctions: Secondary | ICD-10-CM | POA: Diagnosis not present

## 2019-06-09 DIAGNOSIS — Z9181 History of falling: Secondary | ICD-10-CM | POA: Diagnosis not present

## 2019-06-09 DIAGNOSIS — M25519 Pain in unspecified shoulder: Secondary | ICD-10-CM | POA: Diagnosis not present

## 2019-06-10 DIAGNOSIS — M25519 Pain in unspecified shoulder: Secondary | ICD-10-CM | POA: Diagnosis not present

## 2019-06-10 DIAGNOSIS — R1312 Dysphagia, oropharyngeal phase: Secondary | ICD-10-CM | POA: Diagnosis not present

## 2019-06-10 DIAGNOSIS — R488 Other symbolic dysfunctions: Secondary | ICD-10-CM | POA: Diagnosis not present

## 2019-06-10 DIAGNOSIS — R2689 Other abnormalities of gait and mobility: Secondary | ICD-10-CM | POA: Diagnosis not present

## 2019-06-10 DIAGNOSIS — G20 Parkinson's disease: Secondary | ICD-10-CM | POA: Diagnosis not present

## 2019-06-10 DIAGNOSIS — Z9181 History of falling: Secondary | ICD-10-CM | POA: Diagnosis not present

## 2019-06-11 DIAGNOSIS — R1312 Dysphagia, oropharyngeal phase: Secondary | ICD-10-CM | POA: Diagnosis not present

## 2019-06-11 DIAGNOSIS — R2689 Other abnormalities of gait and mobility: Secondary | ICD-10-CM | POA: Diagnosis not present

## 2019-06-11 DIAGNOSIS — R488 Other symbolic dysfunctions: Secondary | ICD-10-CM | POA: Diagnosis not present

## 2019-06-11 DIAGNOSIS — M25519 Pain in unspecified shoulder: Secondary | ICD-10-CM | POA: Diagnosis not present

## 2019-06-11 DIAGNOSIS — Z9181 History of falling: Secondary | ICD-10-CM | POA: Diagnosis not present

## 2019-06-11 DIAGNOSIS — G20 Parkinson's disease: Secondary | ICD-10-CM | POA: Diagnosis not present

## 2019-06-12 DIAGNOSIS — Z9181 History of falling: Secondary | ICD-10-CM | POA: Diagnosis not present

## 2019-06-12 DIAGNOSIS — G20 Parkinson's disease: Secondary | ICD-10-CM | POA: Diagnosis not present

## 2019-06-12 DIAGNOSIS — R488 Other symbolic dysfunctions: Secondary | ICD-10-CM | POA: Diagnosis not present

## 2019-06-12 DIAGNOSIS — M25519 Pain in unspecified shoulder: Secondary | ICD-10-CM | POA: Diagnosis not present

## 2019-06-12 DIAGNOSIS — R1312 Dysphagia, oropharyngeal phase: Secondary | ICD-10-CM | POA: Diagnosis not present

## 2019-06-12 DIAGNOSIS — R2689 Other abnormalities of gait and mobility: Secondary | ICD-10-CM | POA: Diagnosis not present

## 2019-06-13 DIAGNOSIS — M25519 Pain in unspecified shoulder: Secondary | ICD-10-CM | POA: Diagnosis not present

## 2019-06-13 DIAGNOSIS — G20 Parkinson's disease: Secondary | ICD-10-CM | POA: Diagnosis not present

## 2019-06-13 DIAGNOSIS — R1312 Dysphagia, oropharyngeal phase: Secondary | ICD-10-CM | POA: Diagnosis not present

## 2019-06-13 DIAGNOSIS — R2689 Other abnormalities of gait and mobility: Secondary | ICD-10-CM | POA: Diagnosis not present

## 2019-06-13 DIAGNOSIS — R488 Other symbolic dysfunctions: Secondary | ICD-10-CM | POA: Diagnosis not present

## 2019-06-13 DIAGNOSIS — Z9181 History of falling: Secondary | ICD-10-CM | POA: Diagnosis not present

## 2019-06-14 DIAGNOSIS — M25519 Pain in unspecified shoulder: Secondary | ICD-10-CM | POA: Diagnosis not present

## 2019-06-14 DIAGNOSIS — R488 Other symbolic dysfunctions: Secondary | ICD-10-CM | POA: Diagnosis not present

## 2019-06-14 DIAGNOSIS — R1312 Dysphagia, oropharyngeal phase: Secondary | ICD-10-CM | POA: Diagnosis not present

## 2019-06-14 DIAGNOSIS — R2689 Other abnormalities of gait and mobility: Secondary | ICD-10-CM | POA: Diagnosis not present

## 2019-06-14 DIAGNOSIS — G20 Parkinson's disease: Secondary | ICD-10-CM | POA: Diagnosis not present

## 2019-06-14 DIAGNOSIS — Z9181 History of falling: Secondary | ICD-10-CM | POA: Diagnosis not present

## 2019-06-15 DIAGNOSIS — R488 Other symbolic dysfunctions: Secondary | ICD-10-CM | POA: Diagnosis not present

## 2019-06-15 DIAGNOSIS — R2689 Other abnormalities of gait and mobility: Secondary | ICD-10-CM | POA: Diagnosis not present

## 2019-06-15 DIAGNOSIS — G20 Parkinson's disease: Secondary | ICD-10-CM | POA: Diagnosis not present

## 2019-06-15 DIAGNOSIS — R1312 Dysphagia, oropharyngeal phase: Secondary | ICD-10-CM | POA: Diagnosis not present

## 2019-06-15 DIAGNOSIS — Z9181 History of falling: Secondary | ICD-10-CM | POA: Diagnosis not present

## 2019-06-15 DIAGNOSIS — M25519 Pain in unspecified shoulder: Secondary | ICD-10-CM | POA: Diagnosis not present

## 2019-06-16 DIAGNOSIS — R488 Other symbolic dysfunctions: Secondary | ICD-10-CM | POA: Diagnosis not present

## 2019-06-16 DIAGNOSIS — G20 Parkinson's disease: Secondary | ICD-10-CM | POA: Diagnosis not present

## 2019-06-16 DIAGNOSIS — R1312 Dysphagia, oropharyngeal phase: Secondary | ICD-10-CM | POA: Diagnosis not present

## 2019-06-16 DIAGNOSIS — Z9181 History of falling: Secondary | ICD-10-CM | POA: Diagnosis not present

## 2019-06-16 DIAGNOSIS — M25519 Pain in unspecified shoulder: Secondary | ICD-10-CM | POA: Diagnosis not present

## 2019-06-16 DIAGNOSIS — R2689 Other abnormalities of gait and mobility: Secondary | ICD-10-CM | POA: Diagnosis not present

## 2019-06-17 DIAGNOSIS — G20 Parkinson's disease: Secondary | ICD-10-CM | POA: Diagnosis not present

## 2019-06-17 DIAGNOSIS — M25519 Pain in unspecified shoulder: Secondary | ICD-10-CM | POA: Diagnosis not present

## 2019-06-17 DIAGNOSIS — Z9181 History of falling: Secondary | ICD-10-CM | POA: Diagnosis not present

## 2019-06-17 DIAGNOSIS — R1312 Dysphagia, oropharyngeal phase: Secondary | ICD-10-CM | POA: Diagnosis not present

## 2019-06-17 DIAGNOSIS — R2689 Other abnormalities of gait and mobility: Secondary | ICD-10-CM | POA: Diagnosis not present

## 2019-06-17 DIAGNOSIS — R488 Other symbolic dysfunctions: Secondary | ICD-10-CM | POA: Diagnosis not present

## 2019-06-18 DIAGNOSIS — R2689 Other abnormalities of gait and mobility: Secondary | ICD-10-CM | POA: Diagnosis not present

## 2019-06-18 DIAGNOSIS — M25519 Pain in unspecified shoulder: Secondary | ICD-10-CM | POA: Diagnosis not present

## 2019-06-18 DIAGNOSIS — G20 Parkinson's disease: Secondary | ICD-10-CM | POA: Diagnosis not present

## 2019-06-18 DIAGNOSIS — R1312 Dysphagia, oropharyngeal phase: Secondary | ICD-10-CM | POA: Diagnosis not present

## 2019-06-18 DIAGNOSIS — R488 Other symbolic dysfunctions: Secondary | ICD-10-CM | POA: Diagnosis not present

## 2019-06-18 DIAGNOSIS — Z9181 History of falling: Secondary | ICD-10-CM | POA: Diagnosis not present

## 2019-06-19 DIAGNOSIS — R1312 Dysphagia, oropharyngeal phase: Secondary | ICD-10-CM | POA: Diagnosis not present

## 2019-06-19 DIAGNOSIS — R2689 Other abnormalities of gait and mobility: Secondary | ICD-10-CM | POA: Diagnosis not present

## 2019-06-19 DIAGNOSIS — M25519 Pain in unspecified shoulder: Secondary | ICD-10-CM | POA: Diagnosis not present

## 2019-06-19 DIAGNOSIS — R488 Other symbolic dysfunctions: Secondary | ICD-10-CM | POA: Diagnosis not present

## 2019-06-19 DIAGNOSIS — Z9181 History of falling: Secondary | ICD-10-CM | POA: Diagnosis not present

## 2019-06-19 DIAGNOSIS — G20 Parkinson's disease: Secondary | ICD-10-CM | POA: Diagnosis not present

## 2019-06-20 DIAGNOSIS — R1312 Dysphagia, oropharyngeal phase: Secondary | ICD-10-CM | POA: Diagnosis not present

## 2019-06-20 DIAGNOSIS — M25519 Pain in unspecified shoulder: Secondary | ICD-10-CM | POA: Diagnosis not present

## 2019-06-20 DIAGNOSIS — Z9181 History of falling: Secondary | ICD-10-CM | POA: Diagnosis not present

## 2019-06-20 DIAGNOSIS — R488 Other symbolic dysfunctions: Secondary | ICD-10-CM | POA: Diagnosis not present

## 2019-06-20 DIAGNOSIS — R2689 Other abnormalities of gait and mobility: Secondary | ICD-10-CM | POA: Diagnosis not present

## 2019-06-20 DIAGNOSIS — G20 Parkinson's disease: Secondary | ICD-10-CM | POA: Diagnosis not present

## 2019-06-21 DIAGNOSIS — G20 Parkinson's disease: Secondary | ICD-10-CM | POA: Diagnosis not present

## 2019-06-21 DIAGNOSIS — R2689 Other abnormalities of gait and mobility: Secondary | ICD-10-CM | POA: Diagnosis not present

## 2019-06-21 DIAGNOSIS — M25519 Pain in unspecified shoulder: Secondary | ICD-10-CM | POA: Diagnosis not present

## 2019-06-21 DIAGNOSIS — R1312 Dysphagia, oropharyngeal phase: Secondary | ICD-10-CM | POA: Diagnosis not present

## 2019-06-21 DIAGNOSIS — R488 Other symbolic dysfunctions: Secondary | ICD-10-CM | POA: Diagnosis not present

## 2019-06-21 DIAGNOSIS — Z9181 History of falling: Secondary | ICD-10-CM | POA: Diagnosis not present

## 2019-06-22 DIAGNOSIS — M25519 Pain in unspecified shoulder: Secondary | ICD-10-CM | POA: Diagnosis not present

## 2019-06-22 DIAGNOSIS — G20 Parkinson's disease: Secondary | ICD-10-CM | POA: Diagnosis not present

## 2019-06-22 DIAGNOSIS — R1312 Dysphagia, oropharyngeal phase: Secondary | ICD-10-CM | POA: Diagnosis not present

## 2019-06-22 DIAGNOSIS — R2689 Other abnormalities of gait and mobility: Secondary | ICD-10-CM | POA: Diagnosis not present

## 2019-06-22 DIAGNOSIS — Z9181 History of falling: Secondary | ICD-10-CM | POA: Diagnosis not present

## 2019-06-22 DIAGNOSIS — R488 Other symbolic dysfunctions: Secondary | ICD-10-CM | POA: Diagnosis not present

## 2019-06-23 DIAGNOSIS — M25519 Pain in unspecified shoulder: Secondary | ICD-10-CM | POA: Diagnosis not present

## 2019-06-23 DIAGNOSIS — R2689 Other abnormalities of gait and mobility: Secondary | ICD-10-CM | POA: Diagnosis not present

## 2019-06-23 DIAGNOSIS — G20 Parkinson's disease: Secondary | ICD-10-CM | POA: Diagnosis not present

## 2019-06-23 DIAGNOSIS — R488 Other symbolic dysfunctions: Secondary | ICD-10-CM | POA: Diagnosis not present

## 2019-06-23 DIAGNOSIS — R1312 Dysphagia, oropharyngeal phase: Secondary | ICD-10-CM | POA: Diagnosis not present

## 2019-06-23 DIAGNOSIS — Z9181 History of falling: Secondary | ICD-10-CM | POA: Diagnosis not present

## 2019-06-25 DIAGNOSIS — Z9181 History of falling: Secondary | ICD-10-CM | POA: Diagnosis not present

## 2019-06-25 DIAGNOSIS — G20 Parkinson's disease: Secondary | ICD-10-CM | POA: Diagnosis not present

## 2019-06-25 DIAGNOSIS — M25519 Pain in unspecified shoulder: Secondary | ICD-10-CM | POA: Diagnosis not present

## 2019-06-25 DIAGNOSIS — R1312 Dysphagia, oropharyngeal phase: Secondary | ICD-10-CM | POA: Diagnosis not present

## 2019-06-25 DIAGNOSIS — R2689 Other abnormalities of gait and mobility: Secondary | ICD-10-CM | POA: Diagnosis not present

## 2019-06-25 DIAGNOSIS — R488 Other symbolic dysfunctions: Secondary | ICD-10-CM | POA: Diagnosis not present

## 2019-06-26 DIAGNOSIS — R1312 Dysphagia, oropharyngeal phase: Secondary | ICD-10-CM | POA: Diagnosis not present

## 2019-06-26 DIAGNOSIS — Z9181 History of falling: Secondary | ICD-10-CM | POA: Diagnosis not present

## 2019-06-26 DIAGNOSIS — G20 Parkinson's disease: Secondary | ICD-10-CM | POA: Diagnosis not present

## 2019-06-26 DIAGNOSIS — M25519 Pain in unspecified shoulder: Secondary | ICD-10-CM | POA: Diagnosis not present

## 2019-06-26 DIAGNOSIS — R488 Other symbolic dysfunctions: Secondary | ICD-10-CM | POA: Diagnosis not present

## 2019-06-26 DIAGNOSIS — R2689 Other abnormalities of gait and mobility: Secondary | ICD-10-CM | POA: Diagnosis not present

## 2019-06-27 DIAGNOSIS — R488 Other symbolic dysfunctions: Secondary | ICD-10-CM | POA: Diagnosis not present

## 2019-06-27 DIAGNOSIS — Z9181 History of falling: Secondary | ICD-10-CM | POA: Diagnosis not present

## 2019-06-27 DIAGNOSIS — R2689 Other abnormalities of gait and mobility: Secondary | ICD-10-CM | POA: Diagnosis not present

## 2019-06-27 DIAGNOSIS — G20 Parkinson's disease: Secondary | ICD-10-CM | POA: Diagnosis not present

## 2019-06-27 DIAGNOSIS — R1312 Dysphagia, oropharyngeal phase: Secondary | ICD-10-CM | POA: Diagnosis not present

## 2019-06-27 DIAGNOSIS — M25519 Pain in unspecified shoulder: Secondary | ICD-10-CM | POA: Diagnosis not present

## 2019-06-30 ENCOUNTER — Ambulatory Visit (INDEPENDENT_AMBULATORY_CARE_PROVIDER_SITE_OTHER): Payer: Medicare Other | Admitting: *Deleted

## 2019-06-30 DIAGNOSIS — Z950 Presence of cardiac pacemaker: Secondary | ICD-10-CM | POA: Diagnosis not present

## 2019-06-30 LAB — CUP PACEART REMOTE DEVICE CHECK
Battery Remaining Longevity: 66 mo
Battery Remaining Percentage: 77 %
Brady Statistic RV Percent Paced: 84 %
Date Time Interrogation Session: 20201203040100
Implantable Lead Implant Date: 20000401
Implantable Lead Location: 753860
Implantable Lead Model: 4245
Implantable Lead Serial Number: 402472
Implantable Pulse Generator Implant Date: 20150505
Lead Channel Impedance Value: 419 Ohm
Lead Channel Pacing Threshold Amplitude: 0.7 V
Lead Channel Pacing Threshold Pulse Width: 0.4 ms
Lead Channel Setting Pacing Amplitude: 1.4 V
Lead Channel Setting Pacing Pulse Width: 0.4 ms
Lead Channel Setting Sensing Sensitivity: 2.5 mV
Pulse Gen Serial Number: 390662

## 2019-07-01 ENCOUNTER — Ambulatory Visit (INDEPENDENT_AMBULATORY_CARE_PROVIDER_SITE_OTHER): Payer: Medicare Other | Admitting: Urology

## 2019-07-01 ENCOUNTER — Other Ambulatory Visit: Payer: Self-pay

## 2019-07-01 ENCOUNTER — Encounter: Payer: Self-pay | Admitting: Urology

## 2019-07-01 VITALS — BP 135/74 | HR 60 | Ht 69.0 in | Wt 151.2 lb

## 2019-07-01 DIAGNOSIS — N401 Enlarged prostate with lower urinary tract symptoms: Secondary | ICD-10-CM | POA: Diagnosis not present

## 2019-07-01 DIAGNOSIS — M419 Scoliosis, unspecified: Secondary | ICD-10-CM | POA: Insufficient documentation

## 2019-07-01 DIAGNOSIS — H3530 Unspecified macular degeneration: Secondary | ICD-10-CM | POA: Insufficient documentation

## 2019-07-01 DIAGNOSIS — R2689 Other abnormalities of gait and mobility: Secondary | ICD-10-CM | POA: Insufficient documentation

## 2019-07-01 DIAGNOSIS — R5383 Other fatigue: Secondary | ICD-10-CM | POA: Insufficient documentation

## 2019-07-01 DIAGNOSIS — M79609 Pain in unspecified limb: Secondary | ICD-10-CM | POA: Insufficient documentation

## 2019-07-01 DIAGNOSIS — E559 Vitamin D deficiency, unspecified: Secondary | ICD-10-CM | POA: Insufficient documentation

## 2019-07-01 DIAGNOSIS — R32 Unspecified urinary incontinence: Secondary | ICD-10-CM | POA: Insufficient documentation

## 2019-07-01 DIAGNOSIS — R269 Unspecified abnormalities of gait and mobility: Secondary | ICD-10-CM | POA: Insufficient documentation

## 2019-07-01 DIAGNOSIS — M48062 Spinal stenosis, lumbar region with neurogenic claudication: Secondary | ICD-10-CM | POA: Insufficient documentation

## 2019-07-01 DIAGNOSIS — R259 Unspecified abnormal involuntary movements: Secondary | ICD-10-CM | POA: Insufficient documentation

## 2019-07-01 NOTE — Progress Notes (Signed)
07/01/2019 3:42 PM   [name] 1927-01-22 [id number]  Referring provider: Virgel Bouquet, MD [address] [place],  [place] [address]  Chief Complaint  Patient presents with  . Hospitalization Follow-up    HPI: 85 y.o. male referred for evaluation of BPH.  He has a history of BPH and Parkinson's.  He apparently fell in October 2020 and was evaluated in the ED in [place].  Urinalysis did show pyuria and urine culture was positive for 60,000 colonies of Enterococcus.  He was treated.  He notes occasional dysuria but nothing persistent.  He also has frequency, urgency, straining to urinate and a weak urinary stream.  He states he did not know why he was referred here because his urinary symptoms are not bothersome.  He is on double dose tamsulosin at 0.8 mg daily, finasteride and extended release oxybutynin 5 mg daily.  He states he does not desire any additional treatment for his symptoms.  He denies history of recurrent UTIs.  He has had a history of penile prosthesis in the past.   PMH: Past Medical History:  Diagnosis Date  . Arthritis    incl spinal stenosis  . Atrial fibrillation ([place])   . BPH (benign prostatic hyperplasia)   . CHF (congestive heart failure) ([place])   . CHF (congestive heart failure) ([place])   . Dementia ([place])   . Dysphagia   . HTN (hypertension)   . Hyperlipidemia   . Parkinson disease ([place])   . Renal insufficiency    question degree  . Spinal stenosis   . Thyroid disease   . Wears glasses    reading  . Wears partial dentures    upper partial    Surgical History: Past Surgical History:  Procedure Laterality Date  . bilateral shoulder surgery    . EYE SURGERY     both catararcts  . I&D EXTREMITY  12/07/2011   Procedure: IRRIGATION AND DEBRIDEMENT EXTREMITY;  Surgeon: Tennis Must, MD;  Location: [facility];  Service: Orthopedics;  Laterality: Left;  . PACEMAKER GENERATOR CHANGE N/A 11/28/2013   Procedure: PACEMAKER GENERATOR CHANGE;  Surgeon: Deboraha Sprang, MD;  Location: [facility] CATH LAB;  Service: Cardiovascular;  Laterality: N/A;  . PACEMAKER INSERTION     BSX pacemaker generator change 11-28-2013 by Dr Caryl Comes  . prosthetic hip - bilaterally    . R knee quadricep tendon repair    . TONSILLECTOMY    . TRIGGER FINGER RELEASE Right 05/26/2013   Procedure: RIGHT SMALL TRIGGER RELEASE ;  Surgeon: Tennis Must, MD;  Location: [facility];  Service: Orthopedics;  Laterality: Right;    Home Medications:  Allergies as of 07/01/2019      Reactions   Amiodarone Other (See Comments)    fluid on lungs      Medication List       Accurate as of July 01, 2019  3:42 PM. If you have any questions, ask your nurse or doctor.        acetaminophen 500 MG tablet Commonly known as: TYLENOL Take 1,000 mg by mouth 3 (three) times daily. scheduled   amoxicillin 500 MG capsule Commonly known as: AMOXIL Take 500 mg by mouth See admin instructions. Take 4 capsules (2000 mg) by mouth prior to dental appointment   ASPERCREME LIDOCAINE EX Apply 1 application topically 2 (two) times daily.   b complex vitamins tablet Take 1 tablet by mouth daily.   BIOTENE DRY MOUTH MT Use as directed 10 mLs in  the mouth or throat daily as needed (dry mouth).   carbidopa-levodopa 25-100 MG tablet Commonly known as: SINEMET IR Take 1 tablet by mouth 3 (three) times daily.   carbidopa-levodopa 25-250 MG tablet Commonly known as: SINEMET IR Take 1 tablet by mouth daily.   carvedilol 3.125 MG tablet Commonly known as: COREG Take 1 tablet by mouth 2 (two) times daily.   cefTRIAXone 1 g injection Commonly known as: ROCEPHIN   docusate sodium 100 MG capsule Commonly known as: COLACE Take 100 mg by mouth daily.   finasteride 5 MG tablet Commonly known as: PROSCAR Take 5 mg by mouth daily.   Fish Oil 1200 MG Caps Take 2,400 mg by mouth 2 (two) times daily.   fluticasone 27.5 MCG/SPRAY nasal spray Commonly known as: VERAMYST Place 2 sprays  into the nose daily.   furosemide 20 MG tablet Commonly known as: LASIX Take 20 mg by mouth daily.   ipratropium-albuterol 0.5-2.5 (3) MG/3ML Soln Commonly known as: DUONEB Inhale 1 mL into the lungs every 6 (six) hours as needed for wheezing.   levothyroxine 25 MCG tablet Commonly known as: SYNTHROID Take 1 tablet by mouth daily.   lisinopril 5 MG tablet Commonly known as: ZESTRIL Take 1 tablet by mouth daily.   MIRALAX PO Take 1 Dose by mouth 3 (three) times a week. Takes on M/W/F   oxybutynin 5 MG 24 hr tablet Commonly known as: DITROPAN-XL Take 5 mg by mouth daily.   Polyethyl Glycol-Propyl Glycol 0.4-0.3 % Soln Apply 1 drop to eye 2 (two) times daily.   polyvinyl alcohol 1.4 % ophthalmic solution Commonly known as: LIQUIFILM TEARS Place 1 drop into both eyes 5 (five) times daily as needed for dry eyes.   pravastatin 20 MG tablet Commonly known as: PRAVACHOL Take 1 tablet by mouth daily.   PROBIOTIC DAILY PO Take 1 capsule by mouth daily.   rasagiline 0.5 MG Tabs tablet Commonly known as: AZILECT Take 1 tablet by mouth daily.   Restasis 0.05 % ophthalmic emulsion Generic drug: cycloSPORINE Place 1 drop into both eyes 2 (two) times daily.   Rivaroxaban 15 MG Tabs tablet Commonly known as: Xarelto Take 1 tablet (15 mg total) by mouth daily with supper.   senna 8.6 MG Tabs tablet Commonly known as: SENOKOT Take 1 tablet by mouth 2 (two) times daily.   sertraline 50 MG tablet Commonly known as: ZOLOFT Take 50 mg by mouth daily. What changed: Another medication with the same name was removed. Continue taking this medication, and follow the directions you see here. Changed by: Abbie Sons, MD   tamsulosin 0.4 MG Caps capsule Commonly known as: FLOMAX Take 0.4 mg by mouth 2 (two) times daily.   traMADol 50 MG tablet Commonly known as: ULTRAM Take 100 mg by mouth 2 (two) times daily. scheduled   traZODone 50 MG tablet Commonly known as: DESYREL  Take 50 mg by mouth at bedtime.   vitamin C 1000 MG tablet Take 1,000 mg by mouth daily.   Vitamin D-3 125 MCG (5000 UT) Tabs Take 5,000 Units by mouth daily.       Allergies:  Allergies  Allergen Reactions  . Amiodarone Other (See Comments)     fluid on lungs    Family History: No family history on file.  Social History:  reports that he quit smoking about 48 years ago. His smoking use included cigarettes. He has never used smokeless tobacco. He reports that he does not drink alcohol or use  drugs.  ROS: UROLOGY Frequent Urination?: Yes Hard to postpone urination?: Yes Burning/pain with urination?: Yes Get up at night to urinate?: Yes Leakage of urine?: No Urine stream starts and stops?: No Trouble starting stream?: No Do you have to strain to urinate?: Yes Blood in urine?: No Urinary tract infection?: No Sexually transmitted disease?: No Injury to kidneys or bladder?: No Painful intercourse?: No Weak stream?: Yes Erection problems?: No Penile pain?: No  Gastrointestinal Nausea?: No Vomiting?: No Indigestion/heartburn?: No Diarrhea?: No Constipation?: No  Constitutional Fever: No Night sweats?: No Weight loss?: No Fatigue?: Yes  Skin Skin rash/lesions?: No Itching?: No  Eyes Blurred vision?: No Double vision?: No  Ears/Nose/Throat Sore throat?: No Sinus problems?: No  Hematologic/Lymphatic Swollen glands?: No Easy bruising?: No  Cardiovascular Leg swelling?: No Chest pain?: No  Respiratory Cough?: No Shortness of breath?: No  Endocrine Excessive thirst?: No  Musculoskeletal Back pain?: No Joint pain?: No  Neurological Headaches?: No Dizziness?: No  Psychologic Depression?: No Anxiety?: No  Physical Exam: BP 135/74 (BP Location: Left Arm, Patient Position: Sitting, Cuff Size: Normal)   Pulse 60   Ht 5\' 9"  (1.753 m)   Wt 151 lb 3.2 oz (68.6 kg)   BMI 22.33 kg/m   Constitutional:  Alert and oriented, No acute distress.  HEENT: [place] AT, moist mucus membranes.  Trachea midline, no masses. Cardiovascular: No clubbing, cyanosis, or edema. Respiratory: Normal respiratory effort, no increased work of breathing. GI: Abdomen is soft, nontender, nondistended, no abdominal masses GU: Declined DRE Lymph: No cervical or inguinal lymphadenopathy. Skin: No rashes, bruises or suspicious lesions. Neurologic: Grossly intact, no focal deficits, moving all 4 extremities. Psychiatric: Normal mood and affect.   Assessment & Plan:    - Lower urinary tract symptoms Most likely secondary to accommodation of BPH and Parkinson's.  He is on a double dose alpha-blocker and finasteride along with an anticholinergic medication.  PVR by bladder scan today was 88 mL.  He does not desire any additional treatment.  He has intermittent dysuria and would check urinalysis for persistent dysuria.  Follow-up as needed   Abbie Sons, MD  [facility] [address], [place], [place] [address] [phone]

## 2019-07-18 DIAGNOSIS — Z79899 Other long term (current) drug therapy: Secondary | ICD-10-CM | POA: Diagnosis not present

## 2019-07-18 DIAGNOSIS — E559 Vitamin D deficiency, unspecified: Secondary | ICD-10-CM | POA: Diagnosis not present

## 2019-07-18 DIAGNOSIS — E785 Hyperlipidemia, unspecified: Secondary | ICD-10-CM | POA: Diagnosis not present

## 2019-07-18 DIAGNOSIS — E039 Hypothyroidism, unspecified: Secondary | ICD-10-CM | POA: Diagnosis not present

## 2019-07-18 DIAGNOSIS — D696 Thrombocytopenia, unspecified: Secondary | ICD-10-CM | POA: Diagnosis not present

## 2019-07-26 NOTE — Progress Notes (Signed)
PPM remote 

## 2019-07-29 DIAGNOSIS — M25519 Pain in unspecified shoulder: Secondary | ICD-10-CM | POA: Diagnosis not present

## 2019-07-29 DIAGNOSIS — R279 Unspecified lack of coordination: Secondary | ICD-10-CM | POA: Diagnosis not present

## 2019-07-29 DIAGNOSIS — G20 Parkinson's disease: Secondary | ICD-10-CM | POA: Diagnosis not present

## 2019-07-29 DIAGNOSIS — M6281 Muscle weakness (generalized): Secondary | ICD-10-CM | POA: Diagnosis not present

## 2019-08-01 DIAGNOSIS — M25519 Pain in unspecified shoulder: Secondary | ICD-10-CM | POA: Diagnosis not present

## 2019-08-01 DIAGNOSIS — G20 Parkinson's disease: Secondary | ICD-10-CM | POA: Diagnosis not present

## 2019-08-01 DIAGNOSIS — R279 Unspecified lack of coordination: Secondary | ICD-10-CM | POA: Diagnosis not present

## 2019-08-01 DIAGNOSIS — M6281 Muscle weakness (generalized): Secondary | ICD-10-CM | POA: Diagnosis not present

## 2019-08-02 DIAGNOSIS — G20 Parkinson's disease: Secondary | ICD-10-CM | POA: Diagnosis not present

## 2019-08-02 DIAGNOSIS — R279 Unspecified lack of coordination: Secondary | ICD-10-CM | POA: Diagnosis not present

## 2019-08-02 DIAGNOSIS — M25519 Pain in unspecified shoulder: Secondary | ICD-10-CM | POA: Diagnosis not present

## 2019-08-02 DIAGNOSIS — M6281 Muscle weakness (generalized): Secondary | ICD-10-CM | POA: Diagnosis not present

## 2019-08-03 DIAGNOSIS — M6281 Muscle weakness (generalized): Secondary | ICD-10-CM | POA: Diagnosis not present

## 2019-08-03 DIAGNOSIS — G20 Parkinson's disease: Secondary | ICD-10-CM | POA: Diagnosis not present

## 2019-08-03 DIAGNOSIS — R279 Unspecified lack of coordination: Secondary | ICD-10-CM | POA: Diagnosis not present

## 2019-08-03 DIAGNOSIS — M25519 Pain in unspecified shoulder: Secondary | ICD-10-CM | POA: Diagnosis not present

## 2019-08-04 DIAGNOSIS — M6281 Muscle weakness (generalized): Secondary | ICD-10-CM | POA: Diagnosis not present

## 2019-08-04 DIAGNOSIS — R279 Unspecified lack of coordination: Secondary | ICD-10-CM | POA: Diagnosis not present

## 2019-08-04 DIAGNOSIS — G20 Parkinson's disease: Secondary | ICD-10-CM | POA: Diagnosis not present

## 2019-08-04 DIAGNOSIS — M25519 Pain in unspecified shoulder: Secondary | ICD-10-CM | POA: Diagnosis not present

## 2019-08-05 DIAGNOSIS — R279 Unspecified lack of coordination: Secondary | ICD-10-CM | POA: Diagnosis not present

## 2019-08-05 DIAGNOSIS — M25519 Pain in unspecified shoulder: Secondary | ICD-10-CM | POA: Diagnosis not present

## 2019-08-05 DIAGNOSIS — G20 Parkinson's disease: Secondary | ICD-10-CM | POA: Diagnosis not present

## 2019-08-05 DIAGNOSIS — M6281 Muscle weakness (generalized): Secondary | ICD-10-CM | POA: Diagnosis not present

## 2019-08-08 DIAGNOSIS — G20 Parkinson's disease: Secondary | ICD-10-CM | POA: Diagnosis not present

## 2019-08-08 DIAGNOSIS — R279 Unspecified lack of coordination: Secondary | ICD-10-CM | POA: Diagnosis not present

## 2019-08-08 DIAGNOSIS — M25519 Pain in unspecified shoulder: Secondary | ICD-10-CM | POA: Diagnosis not present

## 2019-08-08 DIAGNOSIS — M6281 Muscle weakness (generalized): Secondary | ICD-10-CM | POA: Diagnosis not present

## 2019-08-09 DIAGNOSIS — M25519 Pain in unspecified shoulder: Secondary | ICD-10-CM | POA: Diagnosis not present

## 2019-08-09 DIAGNOSIS — R279 Unspecified lack of coordination: Secondary | ICD-10-CM | POA: Diagnosis not present

## 2019-08-09 DIAGNOSIS — M6281 Muscle weakness (generalized): Secondary | ICD-10-CM | POA: Diagnosis not present

## 2019-08-09 DIAGNOSIS — G20 Parkinson's disease: Secondary | ICD-10-CM | POA: Diagnosis not present

## 2019-08-11 DIAGNOSIS — M6281 Muscle weakness (generalized): Secondary | ICD-10-CM | POA: Diagnosis not present

## 2019-08-11 DIAGNOSIS — G20 Parkinson's disease: Secondary | ICD-10-CM | POA: Diagnosis not present

## 2019-08-11 DIAGNOSIS — R279 Unspecified lack of coordination: Secondary | ICD-10-CM | POA: Diagnosis not present

## 2019-08-11 DIAGNOSIS — M25519 Pain in unspecified shoulder: Secondary | ICD-10-CM | POA: Diagnosis not present

## 2019-08-16 DIAGNOSIS — M6281 Muscle weakness (generalized): Secondary | ICD-10-CM | POA: Diagnosis not present

## 2019-08-16 DIAGNOSIS — R279 Unspecified lack of coordination: Secondary | ICD-10-CM | POA: Diagnosis not present

## 2019-08-16 DIAGNOSIS — M25519 Pain in unspecified shoulder: Secondary | ICD-10-CM | POA: Diagnosis not present

## 2019-08-16 DIAGNOSIS — G20 Parkinson's disease: Secondary | ICD-10-CM | POA: Diagnosis not present

## 2019-08-17 DIAGNOSIS — R279 Unspecified lack of coordination: Secondary | ICD-10-CM | POA: Diagnosis not present

## 2019-08-17 DIAGNOSIS — Z23 Encounter for immunization: Secondary | ICD-10-CM | POA: Diagnosis not present

## 2019-08-17 DIAGNOSIS — G20 Parkinson's disease: Secondary | ICD-10-CM | POA: Diagnosis not present

## 2019-08-17 DIAGNOSIS — M6281 Muscle weakness (generalized): Secondary | ICD-10-CM | POA: Diagnosis not present

## 2019-08-17 DIAGNOSIS — M25519 Pain in unspecified shoulder: Secondary | ICD-10-CM | POA: Diagnosis not present

## 2019-08-19 DIAGNOSIS — F330 Major depressive disorder, recurrent, mild: Secondary | ICD-10-CM | POA: Diagnosis not present

## 2019-08-19 DIAGNOSIS — G20 Parkinson's disease: Secondary | ICD-10-CM | POA: Diagnosis not present

## 2019-08-19 DIAGNOSIS — M6281 Muscle weakness (generalized): Secondary | ICD-10-CM | POA: Diagnosis not present

## 2019-08-19 DIAGNOSIS — R279 Unspecified lack of coordination: Secondary | ICD-10-CM | POA: Diagnosis not present

## 2019-08-19 DIAGNOSIS — M25519 Pain in unspecified shoulder: Secondary | ICD-10-CM | POA: Diagnosis not present

## 2019-08-22 DIAGNOSIS — R279 Unspecified lack of coordination: Secondary | ICD-10-CM | POA: Diagnosis not present

## 2019-08-22 DIAGNOSIS — M6281 Muscle weakness (generalized): Secondary | ICD-10-CM | POA: Diagnosis not present

## 2019-08-22 DIAGNOSIS — M25519 Pain in unspecified shoulder: Secondary | ICD-10-CM | POA: Diagnosis not present

## 2019-08-22 DIAGNOSIS — G20 Parkinson's disease: Secondary | ICD-10-CM | POA: Diagnosis not present

## 2019-08-23 DIAGNOSIS — R279 Unspecified lack of coordination: Secondary | ICD-10-CM | POA: Diagnosis not present

## 2019-08-23 DIAGNOSIS — G20 Parkinson's disease: Secondary | ICD-10-CM | POA: Diagnosis not present

## 2019-08-23 DIAGNOSIS — M25519 Pain in unspecified shoulder: Secondary | ICD-10-CM | POA: Diagnosis not present

## 2019-08-23 DIAGNOSIS — M6281 Muscle weakness (generalized): Secondary | ICD-10-CM | POA: Diagnosis not present

## 2019-08-24 DIAGNOSIS — G20 Parkinson's disease: Secondary | ICD-10-CM | POA: Diagnosis not present

## 2019-08-24 DIAGNOSIS — M6281 Muscle weakness (generalized): Secondary | ICD-10-CM | POA: Diagnosis not present

## 2019-08-24 DIAGNOSIS — R279 Unspecified lack of coordination: Secondary | ICD-10-CM | POA: Diagnosis not present

## 2019-08-24 DIAGNOSIS — M25519 Pain in unspecified shoulder: Secondary | ICD-10-CM | POA: Diagnosis not present

## 2019-08-26 DIAGNOSIS — F330 Major depressive disorder, recurrent, mild: Secondary | ICD-10-CM | POA: Diagnosis not present

## 2019-08-30 DIAGNOSIS — M6281 Muscle weakness (generalized): Secondary | ICD-10-CM | POA: Diagnosis not present

## 2019-08-30 DIAGNOSIS — M25519 Pain in unspecified shoulder: Secondary | ICD-10-CM | POA: Diagnosis not present

## 2019-08-30 DIAGNOSIS — Z9181 History of falling: Secondary | ICD-10-CM | POA: Diagnosis not present

## 2019-08-30 DIAGNOSIS — G20 Parkinson's disease: Secondary | ICD-10-CM | POA: Diagnosis not present

## 2019-08-30 DIAGNOSIS — R2681 Unsteadiness on feet: Secondary | ICD-10-CM | POA: Diagnosis not present

## 2019-08-30 DIAGNOSIS — R279 Unspecified lack of coordination: Secondary | ICD-10-CM | POA: Diagnosis not present

## 2019-08-31 ENCOUNTER — Other Ambulatory Visit: Payer: Self-pay

## 2019-08-31 ENCOUNTER — Encounter: Payer: Self-pay | Admitting: Adult Health

## 2019-08-31 ENCOUNTER — Ambulatory Visit (INDEPENDENT_AMBULATORY_CARE_PROVIDER_SITE_OTHER): Payer: Medicare Other | Admitting: Adult Health

## 2019-08-31 VITALS — BP 98/48 | HR 99 | Temp 97.0°F | Ht 67.0 in

## 2019-08-31 DIAGNOSIS — G20 Parkinson's disease: Secondary | ICD-10-CM

## 2019-08-31 DIAGNOSIS — M6281 Muscle weakness (generalized): Secondary | ICD-10-CM | POA: Diagnosis not present

## 2019-08-31 DIAGNOSIS — R2681 Unsteadiness on feet: Secondary | ICD-10-CM | POA: Diagnosis not present

## 2019-08-31 DIAGNOSIS — R279 Unspecified lack of coordination: Secondary | ICD-10-CM | POA: Diagnosis not present

## 2019-08-31 DIAGNOSIS — Z9181 History of falling: Secondary | ICD-10-CM | POA: Diagnosis not present

## 2019-08-31 DIAGNOSIS — M25519 Pain in unspecified shoulder: Secondary | ICD-10-CM | POA: Diagnosis not present

## 2019-08-31 NOTE — Patient Instructions (Signed)
Your Plan:  Continue Sinemet and Azlict If your symptoms worsen or you develop new symptoms please let us know.    Thank you for coming to see [place] at [facility]. I hope we have been able to provide you high quality care today.  You may receive a patient satisfaction survey over the next few weeks. We would appreciate your feedback and comments so that we may continue to improve ourselves and the health of our patients.
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
## 2019-09-01 DIAGNOSIS — F330 Major depressive disorder, recurrent, mild: Secondary | ICD-10-CM | POA: Diagnosis not present

## 2019-09-06 DIAGNOSIS — R2681 Unsteadiness on feet: Secondary | ICD-10-CM | POA: Diagnosis not present

## 2019-09-06 DIAGNOSIS — M25519 Pain in unspecified shoulder: Secondary | ICD-10-CM | POA: Diagnosis not present

## 2019-09-06 DIAGNOSIS — R279 Unspecified lack of coordination: Secondary | ICD-10-CM | POA: Diagnosis not present

## 2019-09-06 DIAGNOSIS — M6281 Muscle weakness (generalized): Secondary | ICD-10-CM | POA: Diagnosis not present

## 2019-09-06 DIAGNOSIS — G20 Parkinson's disease: Secondary | ICD-10-CM | POA: Diagnosis not present

## 2019-09-06 DIAGNOSIS — Z9181 History of falling: Secondary | ICD-10-CM | POA: Diagnosis not present

## 2019-09-07 DIAGNOSIS — R2681 Unsteadiness on feet: Secondary | ICD-10-CM | POA: Diagnosis not present

## 2019-09-07 DIAGNOSIS — M25519 Pain in unspecified shoulder: Secondary | ICD-10-CM | POA: Diagnosis not present

## 2019-09-07 DIAGNOSIS — M6281 Muscle weakness (generalized): Secondary | ICD-10-CM | POA: Diagnosis not present

## 2019-09-07 DIAGNOSIS — R279 Unspecified lack of coordination: Secondary | ICD-10-CM | POA: Diagnosis not present

## 2019-09-07 DIAGNOSIS — Z9181 History of falling: Secondary | ICD-10-CM | POA: Diagnosis not present

## 2019-09-07 DIAGNOSIS — G20 Parkinson's disease: Secondary | ICD-10-CM | POA: Diagnosis not present

## 2019-09-09 DIAGNOSIS — M25519 Pain in unspecified shoulder: Secondary | ICD-10-CM | POA: Diagnosis not present

## 2019-09-09 DIAGNOSIS — Z9181 History of falling: Secondary | ICD-10-CM | POA: Diagnosis not present

## 2019-09-09 DIAGNOSIS — M6281 Muscle weakness (generalized): Secondary | ICD-10-CM | POA: Diagnosis not present

## 2019-09-09 DIAGNOSIS — R2681 Unsteadiness on feet: Secondary | ICD-10-CM | POA: Diagnosis not present

## 2019-09-09 DIAGNOSIS — R279 Unspecified lack of coordination: Secondary | ICD-10-CM | POA: Diagnosis not present

## 2019-09-09 DIAGNOSIS — G20 Parkinson's disease: Secondary | ICD-10-CM | POA: Diagnosis not present

## 2019-09-12 DIAGNOSIS — R5383 Other fatigue: Secondary | ICD-10-CM | POA: Diagnosis not present

## 2019-09-12 DIAGNOSIS — R630 Anorexia: Secondary | ICD-10-CM | POA: Diagnosis not present

## 2019-09-12 DIAGNOSIS — R05 Cough: Secondary | ICD-10-CM | POA: Diagnosis not present

## 2019-09-13 DIAGNOSIS — I509 Heart failure, unspecified: Secondary | ICD-10-CM | POA: Diagnosis not present

## 2019-09-13 DIAGNOSIS — R279 Unspecified lack of coordination: Secondary | ICD-10-CM | POA: Diagnosis not present

## 2019-09-13 DIAGNOSIS — R2681 Unsteadiness on feet: Secondary | ICD-10-CM | POA: Diagnosis not present

## 2019-09-13 DIAGNOSIS — R05 Cough: Secondary | ICD-10-CM | POA: Diagnosis not present

## 2019-09-13 DIAGNOSIS — M6281 Muscle weakness (generalized): Secondary | ICD-10-CM | POA: Diagnosis not present

## 2019-09-13 DIAGNOSIS — G20 Parkinson's disease: Secondary | ICD-10-CM | POA: Diagnosis not present

## 2019-09-13 DIAGNOSIS — M25519 Pain in unspecified shoulder: Secondary | ICD-10-CM | POA: Diagnosis not present

## 2019-09-13 DIAGNOSIS — Z9181 History of falling: Secondary | ICD-10-CM | POA: Diagnosis not present

## 2019-09-14 DIAGNOSIS — M6281 Muscle weakness (generalized): Secondary | ICD-10-CM | POA: Diagnosis not present

## 2019-09-14 DIAGNOSIS — M25519 Pain in unspecified shoulder: Secondary | ICD-10-CM | POA: Diagnosis not present

## 2019-09-14 DIAGNOSIS — G20 Parkinson's disease: Secondary | ICD-10-CM | POA: Diagnosis not present

## 2019-09-14 DIAGNOSIS — R319 Hematuria, unspecified: Secondary | ICD-10-CM | POA: Diagnosis not present

## 2019-09-14 DIAGNOSIS — R2681 Unsteadiness on feet: Secondary | ICD-10-CM | POA: Diagnosis not present

## 2019-09-14 DIAGNOSIS — N390 Urinary tract infection, site not specified: Secondary | ICD-10-CM | POA: Diagnosis not present

## 2019-09-14 DIAGNOSIS — Z23 Encounter for immunization: Secondary | ICD-10-CM | POA: Diagnosis not present

## 2019-09-14 DIAGNOSIS — R279 Unspecified lack of coordination: Secondary | ICD-10-CM | POA: Diagnosis not present

## 2019-09-14 DIAGNOSIS — Z9181 History of falling: Secondary | ICD-10-CM | POA: Diagnosis not present

## 2019-09-14 DIAGNOSIS — N3000 Acute cystitis without hematuria: Secondary | ICD-10-CM | POA: Diagnosis not present

## 2019-09-14 DIAGNOSIS — D649 Anemia, unspecified: Secondary | ICD-10-CM | POA: Diagnosis not present

## 2019-09-14 DIAGNOSIS — E039 Hypothyroidism, unspecified: Secondary | ICD-10-CM | POA: Diagnosis not present

## 2019-09-15 DIAGNOSIS — M6281 Muscle weakness (generalized): Secondary | ICD-10-CM | POA: Diagnosis not present

## 2019-09-15 DIAGNOSIS — R279 Unspecified lack of coordination: Secondary | ICD-10-CM | POA: Diagnosis not present

## 2019-09-15 DIAGNOSIS — M25519 Pain in unspecified shoulder: Secondary | ICD-10-CM | POA: Diagnosis not present

## 2019-09-15 DIAGNOSIS — R2681 Unsteadiness on feet: Secondary | ICD-10-CM | POA: Diagnosis not present

## 2019-09-15 DIAGNOSIS — G20 Parkinson's disease: Secondary | ICD-10-CM | POA: Diagnosis not present

## 2019-09-15 DIAGNOSIS — Z9181 History of falling: Secondary | ICD-10-CM | POA: Diagnosis not present

## 2019-09-16 DIAGNOSIS — I5040 Unspecified combined systolic (congestive) and diastolic (congestive) heart failure: Secondary | ICD-10-CM | POA: Diagnosis not present

## 2019-09-16 DIAGNOSIS — N390 Urinary tract infection, site not specified: Secondary | ICD-10-CM | POA: Diagnosis not present

## 2019-09-16 DIAGNOSIS — I509 Heart failure, unspecified: Secondary | ICD-10-CM | POA: Diagnosis not present

## 2019-09-20 DIAGNOSIS — R279 Unspecified lack of coordination: Secondary | ICD-10-CM | POA: Diagnosis not present

## 2019-09-20 DIAGNOSIS — M6281 Muscle weakness (generalized): Secondary | ICD-10-CM | POA: Diagnosis not present

## 2019-09-20 DIAGNOSIS — M25519 Pain in unspecified shoulder: Secondary | ICD-10-CM | POA: Diagnosis not present

## 2019-09-20 DIAGNOSIS — R2681 Unsteadiness on feet: Secondary | ICD-10-CM | POA: Diagnosis not present

## 2019-09-20 DIAGNOSIS — G20 Parkinson's disease: Secondary | ICD-10-CM | POA: Diagnosis not present

## 2019-09-20 DIAGNOSIS — Z9181 History of falling: Secondary | ICD-10-CM | POA: Diagnosis not present

## 2019-09-21 DIAGNOSIS — M25519 Pain in unspecified shoulder: Secondary | ICD-10-CM | POA: Diagnosis not present

## 2019-09-21 DIAGNOSIS — G20 Parkinson's disease: Secondary | ICD-10-CM | POA: Diagnosis not present

## 2019-09-21 DIAGNOSIS — I509 Heart failure, unspecified: Secondary | ICD-10-CM | POA: Diagnosis not present

## 2019-09-21 DIAGNOSIS — R9431 Abnormal electrocardiogram [ECG] [EKG]: Secondary | ICD-10-CM | POA: Diagnosis not present

## 2019-09-21 DIAGNOSIS — M6281 Muscle weakness (generalized): Secondary | ICD-10-CM | POA: Diagnosis not present

## 2019-09-21 DIAGNOSIS — R2681 Unsteadiness on feet: Secondary | ICD-10-CM | POA: Diagnosis not present

## 2019-09-21 DIAGNOSIS — J189 Pneumonia, unspecified organism: Secondary | ICD-10-CM | POA: Diagnosis not present

## 2019-09-21 DIAGNOSIS — Z9181 History of falling: Secondary | ICD-10-CM | POA: Diagnosis not present

## 2019-09-21 DIAGNOSIS — K5900 Constipation, unspecified: Secondary | ICD-10-CM | POA: Diagnosis not present

## 2019-09-21 DIAGNOSIS — R279 Unspecified lack of coordination: Secondary | ICD-10-CM | POA: Diagnosis not present

## 2019-09-21 DIAGNOSIS — N390 Urinary tract infection, site not specified: Secondary | ICD-10-CM | POA: Diagnosis not present

## 2019-09-21 DIAGNOSIS — I4891 Unspecified atrial fibrillation: Secondary | ICD-10-CM | POA: Diagnosis not present

## 2019-09-22 DIAGNOSIS — G20 Parkinson's disease: Secondary | ICD-10-CM | POA: Diagnosis not present

## 2019-09-22 DIAGNOSIS — I509 Heart failure, unspecified: Secondary | ICD-10-CM | POA: Diagnosis not present

## 2019-09-22 DIAGNOSIS — J189 Pneumonia, unspecified organism: Secondary | ICD-10-CM | POA: Diagnosis not present

## 2019-09-22 DIAGNOSIS — M25519 Pain in unspecified shoulder: Secondary | ICD-10-CM | POA: Diagnosis not present

## 2019-09-22 DIAGNOSIS — R2681 Unsteadiness on feet: Secondary | ICD-10-CM | POA: Diagnosis not present

## 2019-09-22 DIAGNOSIS — Z9181 History of falling: Secondary | ICD-10-CM | POA: Diagnosis not present

## 2019-09-22 DIAGNOSIS — I482 Chronic atrial fibrillation, unspecified: Secondary | ICD-10-CM | POA: Diagnosis not present

## 2019-09-22 DIAGNOSIS — R279 Unspecified lack of coordination: Secondary | ICD-10-CM | POA: Diagnosis not present

## 2019-09-22 DIAGNOSIS — M6281 Muscle weakness (generalized): Secondary | ICD-10-CM | POA: Diagnosis not present

## 2019-09-22 DIAGNOSIS — R5383 Other fatigue: Secondary | ICD-10-CM | POA: Diagnosis not present

## 2019-09-23 DIAGNOSIS — I509 Heart failure, unspecified: Secondary | ICD-10-CM | POA: Diagnosis not present

## 2019-09-23 DIAGNOSIS — G20 Parkinson's disease: Secondary | ICD-10-CM | POA: Diagnosis not present

## 2019-09-23 DIAGNOSIS — I4891 Unspecified atrial fibrillation: Secondary | ICD-10-CM | POA: Diagnosis not present

## 2019-09-23 DIAGNOSIS — M25519 Pain in unspecified shoulder: Secondary | ICD-10-CM | POA: Diagnosis not present

## 2019-09-23 DIAGNOSIS — R279 Unspecified lack of coordination: Secondary | ICD-10-CM | POA: Diagnosis not present

## 2019-09-23 DIAGNOSIS — R2681 Unsteadiness on feet: Secondary | ICD-10-CM | POA: Diagnosis not present

## 2019-09-23 DIAGNOSIS — Z9181 History of falling: Secondary | ICD-10-CM | POA: Diagnosis not present

## 2019-09-23 DIAGNOSIS — M6281 Muscle weakness (generalized): Secondary | ICD-10-CM | POA: Diagnosis not present

## 2019-09-24 DIAGNOSIS — M6281 Muscle weakness (generalized): Secondary | ICD-10-CM | POA: Diagnosis not present

## 2019-09-24 DIAGNOSIS — I509 Heart failure, unspecified: Secondary | ICD-10-CM | POA: Diagnosis not present

## 2019-09-24 DIAGNOSIS — Z9181 History of falling: Secondary | ICD-10-CM | POA: Diagnosis not present

## 2019-09-24 DIAGNOSIS — R2681 Unsteadiness on feet: Secondary | ICD-10-CM | POA: Diagnosis not present

## 2019-09-24 DIAGNOSIS — R279 Unspecified lack of coordination: Secondary | ICD-10-CM | POA: Diagnosis not present

## 2019-09-24 DIAGNOSIS — J189 Pneumonia, unspecified organism: Secondary | ICD-10-CM | POA: Diagnosis not present

## 2019-09-24 DIAGNOSIS — G20 Parkinson's disease: Secondary | ICD-10-CM | POA: Diagnosis not present

## 2019-09-24 DIAGNOSIS — M25519 Pain in unspecified shoulder: Secondary | ICD-10-CM | POA: Diagnosis not present

## 2019-09-25 DIAGNOSIS — M6281 Muscle weakness (generalized): Secondary | ICD-10-CM | POA: Diagnosis not present

## 2019-09-25 DIAGNOSIS — G20 Parkinson's disease: Secondary | ICD-10-CM | POA: Diagnosis not present

## 2019-09-25 DIAGNOSIS — R279 Unspecified lack of coordination: Secondary | ICD-10-CM | POA: Diagnosis not present

## 2019-09-25 DIAGNOSIS — R2681 Unsteadiness on feet: Secondary | ICD-10-CM | POA: Diagnosis not present

## 2019-09-25 DIAGNOSIS — M25519 Pain in unspecified shoulder: Secondary | ICD-10-CM | POA: Diagnosis not present

## 2019-09-25 DIAGNOSIS — Z9181 History of falling: Secondary | ICD-10-CM | POA: Diagnosis not present

## 2019-09-26 DIAGNOSIS — I4891 Unspecified atrial fibrillation: Secondary | ICD-10-CM | POA: Diagnosis not present

## 2019-09-26 DIAGNOSIS — D649 Anemia, unspecified: Secondary | ICD-10-CM | POA: Diagnosis not present

## 2019-09-26 DIAGNOSIS — N179 Acute kidney failure, unspecified: Secondary | ICD-10-CM | POA: Diagnosis not present

## 2019-09-26 DIAGNOSIS — I509 Heart failure, unspecified: Secondary | ICD-10-CM | POA: Diagnosis not present

## 2019-09-27 DIAGNOSIS — I509 Heart failure, unspecified: Secondary | ICD-10-CM | POA: Diagnosis not present

## 2019-09-27 DIAGNOSIS — F0391 Unspecified dementia with behavioral disturbance: Secondary | ICD-10-CM | POA: Diagnosis not present

## 2019-09-27 DIAGNOSIS — Z9181 History of falling: Secondary | ICD-10-CM | POA: Diagnosis not present

## 2019-09-27 DIAGNOSIS — R1312 Dysphagia, oropharyngeal phase: Secondary | ICD-10-CM | POA: Diagnosis not present

## 2019-09-27 DIAGNOSIS — R279 Unspecified lack of coordination: Secondary | ICD-10-CM | POA: Diagnosis not present

## 2019-09-27 DIAGNOSIS — I4891 Unspecified atrial fibrillation: Secondary | ICD-10-CM | POA: Diagnosis not present

## 2019-09-27 DIAGNOSIS — M6281 Muscle weakness (generalized): Secondary | ICD-10-CM | POA: Diagnosis not present

## 2019-09-27 DIAGNOSIS — F330 Major depressive disorder, recurrent, mild: Secondary | ICD-10-CM | POA: Diagnosis not present

## 2019-09-27 DIAGNOSIS — N3000 Acute cystitis without hematuria: Secondary | ICD-10-CM | POA: Diagnosis not present

## 2019-09-27 DIAGNOSIS — G20 Parkinson's disease: Secondary | ICD-10-CM | POA: Diagnosis not present

## 2019-09-27 DIAGNOSIS — R488 Other symbolic dysfunctions: Secondary | ICD-10-CM | POA: Diagnosis not present

## 2019-09-27 DIAGNOSIS — M25519 Pain in unspecified shoulder: Secondary | ICD-10-CM | POA: Diagnosis not present

## 2019-09-27 DIAGNOSIS — R2681 Unsteadiness on feet: Secondary | ICD-10-CM | POA: Diagnosis not present

## 2019-09-27 DIAGNOSIS — E039 Hypothyroidism, unspecified: Secondary | ICD-10-CM | POA: Diagnosis not present

## 2019-09-28 DIAGNOSIS — R488 Other symbolic dysfunctions: Secondary | ICD-10-CM | POA: Diagnosis not present

## 2019-09-28 DIAGNOSIS — R1312 Dysphagia, oropharyngeal phase: Secondary | ICD-10-CM | POA: Diagnosis not present

## 2019-09-28 DIAGNOSIS — G20 Parkinson's disease: Secondary | ICD-10-CM | POA: Diagnosis not present

## 2019-09-28 DIAGNOSIS — R2681 Unsteadiness on feet: Secondary | ICD-10-CM | POA: Diagnosis not present

## 2019-09-28 DIAGNOSIS — R279 Unspecified lack of coordination: Secondary | ICD-10-CM | POA: Diagnosis not present

## 2019-09-28 DIAGNOSIS — J810 Acute pulmonary edema: Secondary | ICD-10-CM | POA: Diagnosis not present

## 2019-09-28 DIAGNOSIS — M6281 Muscle weakness (generalized): Secondary | ICD-10-CM | POA: Diagnosis not present

## 2019-09-28 DIAGNOSIS — J159 Unspecified bacterial pneumonia: Secondary | ICD-10-CM | POA: Diagnosis not present

## 2019-09-28 DIAGNOSIS — D649 Anemia, unspecified: Secondary | ICD-10-CM | POA: Diagnosis not present

## 2019-09-29 ENCOUNTER — Ambulatory Visit (INDEPENDENT_AMBULATORY_CARE_PROVIDER_SITE_OTHER): Payer: Medicare Other | Admitting: *Deleted

## 2019-09-29 DIAGNOSIS — Z950 Presence of cardiac pacemaker: Secondary | ICD-10-CM

## 2019-09-29 DIAGNOSIS — R2681 Unsteadiness on feet: Secondary | ICD-10-CM | POA: Diagnosis not present

## 2019-09-29 DIAGNOSIS — R1312 Dysphagia, oropharyngeal phase: Secondary | ICD-10-CM | POA: Diagnosis not present

## 2019-09-29 DIAGNOSIS — M6281 Muscle weakness (generalized): Secondary | ICD-10-CM | POA: Diagnosis not present

## 2019-09-29 DIAGNOSIS — R488 Other symbolic dysfunctions: Secondary | ICD-10-CM | POA: Diagnosis not present

## 2019-09-29 DIAGNOSIS — N179 Acute kidney failure, unspecified: Secondary | ICD-10-CM | POA: Diagnosis not present

## 2019-09-29 DIAGNOSIS — R5383 Other fatigue: Secondary | ICD-10-CM | POA: Diagnosis not present

## 2019-09-29 DIAGNOSIS — D649 Anemia, unspecified: Secondary | ICD-10-CM | POA: Diagnosis not present

## 2019-09-29 DIAGNOSIS — G20 Parkinson's disease: Secondary | ICD-10-CM | POA: Diagnosis not present

## 2019-09-29 DIAGNOSIS — R279 Unspecified lack of coordination: Secondary | ICD-10-CM | POA: Diagnosis not present

## 2019-09-29 LAB — CUP PACEART REMOTE DEVICE CHECK
Battery Remaining Longevity: 60 mo
Battery Remaining Percentage: 71 %
Brady Statistic RV Percent Paced: 77 %
Date Time Interrogation Session: 20210304040100
Implantable Lead Implant Date: 20000401
Implantable Lead Location: 753860
Implantable Lead Model: 4245
Implantable Lead Serial Number: 402472
Implantable Pulse Generator Implant Date: 20150505
Lead Channel Impedance Value: 417 Ohm
Lead Channel Pacing Threshold Amplitude: 1 V
Lead Channel Pacing Threshold Pulse Width: 0.4 ms
Lead Channel Setting Pacing Amplitude: 1.4 V
Lead Channel Setting Pacing Pulse Width: 0.4 ms
Lead Channel Setting Sensing Sensitivity: 2.5 mV
Pulse Gen Serial Number: 390662

## 2019-09-29 NOTE — Progress Notes (Signed)
PPM Remote  

## 2019-09-30 DIAGNOSIS — M6281 Muscle weakness (generalized): Secondary | ICD-10-CM | POA: Diagnosis not present

## 2019-09-30 DIAGNOSIS — R279 Unspecified lack of coordination: Secondary | ICD-10-CM | POA: Diagnosis not present

## 2019-09-30 DIAGNOSIS — R488 Other symbolic dysfunctions: Secondary | ICD-10-CM | POA: Diagnosis not present

## 2019-09-30 DIAGNOSIS — R1312 Dysphagia, oropharyngeal phase: Secondary | ICD-10-CM | POA: Diagnosis not present

## 2019-09-30 DIAGNOSIS — G20 Parkinson's disease: Secondary | ICD-10-CM | POA: Diagnosis not present

## 2019-09-30 DIAGNOSIS — R2681 Unsteadiness on feet: Secondary | ICD-10-CM | POA: Diagnosis not present

## 2019-10-01 DIAGNOSIS — R488 Other symbolic dysfunctions: Secondary | ICD-10-CM | POA: Diagnosis not present

## 2019-10-01 DIAGNOSIS — R279 Unspecified lack of coordination: Secondary | ICD-10-CM | POA: Diagnosis not present

## 2019-10-01 DIAGNOSIS — M6281 Muscle weakness (generalized): Secondary | ICD-10-CM | POA: Diagnosis not present

## 2019-10-01 DIAGNOSIS — R2681 Unsteadiness on feet: Secondary | ICD-10-CM | POA: Diagnosis not present

## 2019-10-01 DIAGNOSIS — R1312 Dysphagia, oropharyngeal phase: Secondary | ICD-10-CM | POA: Diagnosis not present

## 2019-10-01 DIAGNOSIS — G20 Parkinson's disease: Secondary | ICD-10-CM | POA: Diagnosis not present

## 2019-10-02 DIAGNOSIS — R488 Other symbolic dysfunctions: Secondary | ICD-10-CM | POA: Diagnosis not present

## 2019-10-02 DIAGNOSIS — M6281 Muscle weakness (generalized): Secondary | ICD-10-CM | POA: Diagnosis not present

## 2019-10-02 DIAGNOSIS — R2681 Unsteadiness on feet: Secondary | ICD-10-CM | POA: Diagnosis not present

## 2019-10-02 DIAGNOSIS — R279 Unspecified lack of coordination: Secondary | ICD-10-CM | POA: Diagnosis not present

## 2019-10-02 DIAGNOSIS — G20 Parkinson's disease: Secondary | ICD-10-CM | POA: Diagnosis not present

## 2019-10-02 DIAGNOSIS — R1312 Dysphagia, oropharyngeal phase: Secondary | ICD-10-CM | POA: Diagnosis not present

## 2019-10-03 DIAGNOSIS — I4891 Unspecified atrial fibrillation: Secondary | ICD-10-CM | POA: Diagnosis not present

## 2019-10-03 DIAGNOSIS — G20 Parkinson's disease: Secondary | ICD-10-CM | POA: Diagnosis not present

## 2019-10-03 DIAGNOSIS — R279 Unspecified lack of coordination: Secondary | ICD-10-CM | POA: Diagnosis not present

## 2019-10-03 DIAGNOSIS — M6281 Muscle weakness (generalized): Secondary | ICD-10-CM | POA: Diagnosis not present

## 2019-10-03 DIAGNOSIS — R488 Other symbolic dysfunctions: Secondary | ICD-10-CM | POA: Diagnosis not present

## 2019-10-03 DIAGNOSIS — R1312 Dysphagia, oropharyngeal phase: Secondary | ICD-10-CM | POA: Diagnosis not present

## 2019-10-03 DIAGNOSIS — R2681 Unsteadiness on feet: Secondary | ICD-10-CM | POA: Diagnosis not present

## 2019-10-04 DIAGNOSIS — M545 Low back pain: Secondary | ICD-10-CM | POA: Diagnosis not present

## 2019-10-04 DIAGNOSIS — D649 Anemia, unspecified: Secondary | ICD-10-CM | POA: Diagnosis not present

## 2019-10-04 DIAGNOSIS — R488 Other symbolic dysfunctions: Secondary | ICD-10-CM | POA: Diagnosis not present

## 2019-10-04 DIAGNOSIS — R2681 Unsteadiness on feet: Secondary | ICD-10-CM | POA: Diagnosis not present

## 2019-10-04 DIAGNOSIS — G20 Parkinson's disease: Secondary | ICD-10-CM | POA: Diagnosis not present

## 2019-10-04 DIAGNOSIS — R1312 Dysphagia, oropharyngeal phase: Secondary | ICD-10-CM | POA: Diagnosis not present

## 2019-10-04 DIAGNOSIS — M6281 Muscle weakness (generalized): Secondary | ICD-10-CM | POA: Diagnosis not present

## 2019-10-04 DIAGNOSIS — R279 Unspecified lack of coordination: Secondary | ICD-10-CM | POA: Diagnosis not present

## 2019-10-05 DIAGNOSIS — R2681 Unsteadiness on feet: Secondary | ICD-10-CM | POA: Diagnosis not present

## 2019-10-05 DIAGNOSIS — M6281 Muscle weakness (generalized): Secondary | ICD-10-CM | POA: Diagnosis not present

## 2019-10-05 DIAGNOSIS — R279 Unspecified lack of coordination: Secondary | ICD-10-CM | POA: Diagnosis not present

## 2019-10-05 DIAGNOSIS — R1312 Dysphagia, oropharyngeal phase: Secondary | ICD-10-CM | POA: Diagnosis not present

## 2019-10-05 DIAGNOSIS — R488 Other symbolic dysfunctions: Secondary | ICD-10-CM | POA: Diagnosis not present

## 2019-10-05 DIAGNOSIS — G20 Parkinson's disease: Secondary | ICD-10-CM | POA: Diagnosis not present

## 2019-10-06 DIAGNOSIS — M6281 Muscle weakness (generalized): Secondary | ICD-10-CM | POA: Diagnosis not present

## 2019-10-06 DIAGNOSIS — R279 Unspecified lack of coordination: Secondary | ICD-10-CM | POA: Diagnosis not present

## 2019-10-06 DIAGNOSIS — R488 Other symbolic dysfunctions: Secondary | ICD-10-CM | POA: Diagnosis not present

## 2019-10-06 DIAGNOSIS — G20 Parkinson's disease: Secondary | ICD-10-CM | POA: Diagnosis not present

## 2019-10-06 DIAGNOSIS — R1312 Dysphagia, oropharyngeal phase: Secondary | ICD-10-CM | POA: Diagnosis not present

## 2019-10-06 DIAGNOSIS — R2681 Unsteadiness on feet: Secondary | ICD-10-CM | POA: Diagnosis not present

## 2019-10-07 DIAGNOSIS — G20 Parkinson's disease: Secondary | ICD-10-CM | POA: Diagnosis not present

## 2019-10-07 DIAGNOSIS — R2681 Unsteadiness on feet: Secondary | ICD-10-CM | POA: Diagnosis not present

## 2019-10-07 DIAGNOSIS — R279 Unspecified lack of coordination: Secondary | ICD-10-CM | POA: Diagnosis not present

## 2019-10-07 DIAGNOSIS — N390 Urinary tract infection, site not specified: Secondary | ICD-10-CM | POA: Diagnosis not present

## 2019-10-07 DIAGNOSIS — W19XXXD Unspecified fall, subsequent encounter: Secondary | ICD-10-CM | POA: Diagnosis not present

## 2019-10-07 DIAGNOSIS — R1312 Dysphagia, oropharyngeal phase: Secondary | ICD-10-CM | POA: Diagnosis not present

## 2019-10-07 DIAGNOSIS — R488 Other symbolic dysfunctions: Secondary | ICD-10-CM | POA: Diagnosis not present

## 2019-10-07 DIAGNOSIS — M48061 Spinal stenosis, lumbar region without neurogenic claudication: Secondary | ICD-10-CM | POA: Diagnosis not present

## 2019-10-07 DIAGNOSIS — M6281 Muscle weakness (generalized): Secondary | ICD-10-CM | POA: Diagnosis not present

## 2019-10-07 DIAGNOSIS — M545 Low back pain: Secondary | ICD-10-CM | POA: Diagnosis not present

## 2019-10-08 DIAGNOSIS — R2681 Unsteadiness on feet: Secondary | ICD-10-CM | POA: Diagnosis not present

## 2019-10-08 DIAGNOSIS — M6281 Muscle weakness (generalized): Secondary | ICD-10-CM | POA: Diagnosis not present

## 2019-10-08 DIAGNOSIS — R1312 Dysphagia, oropharyngeal phase: Secondary | ICD-10-CM | POA: Diagnosis not present

## 2019-10-08 DIAGNOSIS — G20 Parkinson's disease: Secondary | ICD-10-CM | POA: Diagnosis not present

## 2019-10-08 DIAGNOSIS — R488 Other symbolic dysfunctions: Secondary | ICD-10-CM | POA: Diagnosis not present

## 2019-10-08 DIAGNOSIS — R279 Unspecified lack of coordination: Secondary | ICD-10-CM | POA: Diagnosis not present

## 2019-10-09 DIAGNOSIS — R279 Unspecified lack of coordination: Secondary | ICD-10-CM | POA: Diagnosis not present

## 2019-10-09 DIAGNOSIS — M6281 Muscle weakness (generalized): Secondary | ICD-10-CM | POA: Diagnosis not present

## 2019-10-09 DIAGNOSIS — G20 Parkinson's disease: Secondary | ICD-10-CM | POA: Diagnosis not present

## 2019-10-09 DIAGNOSIS — R1312 Dysphagia, oropharyngeal phase: Secondary | ICD-10-CM | POA: Diagnosis not present

## 2019-10-09 DIAGNOSIS — R2681 Unsteadiness on feet: Secondary | ICD-10-CM | POA: Diagnosis not present

## 2019-10-09 DIAGNOSIS — R488 Other symbolic dysfunctions: Secondary | ICD-10-CM | POA: Diagnosis not present

## 2019-10-10 DIAGNOSIS — G20 Parkinson's disease: Secondary | ICD-10-CM | POA: Diagnosis not present

## 2019-10-10 DIAGNOSIS — R279 Unspecified lack of coordination: Secondary | ICD-10-CM | POA: Diagnosis not present

## 2019-10-10 DIAGNOSIS — M6281 Muscle weakness (generalized): Secondary | ICD-10-CM | POA: Diagnosis not present

## 2019-10-10 DIAGNOSIS — R2681 Unsteadiness on feet: Secondary | ICD-10-CM | POA: Diagnosis not present

## 2019-10-10 DIAGNOSIS — R1312 Dysphagia, oropharyngeal phase: Secondary | ICD-10-CM | POA: Diagnosis not present

## 2019-10-10 DIAGNOSIS — R488 Other symbolic dysfunctions: Secondary | ICD-10-CM | POA: Diagnosis not present

## 2019-10-11 DIAGNOSIS — R279 Unspecified lack of coordination: Secondary | ICD-10-CM | POA: Diagnosis not present

## 2019-10-11 DIAGNOSIS — R488 Other symbolic dysfunctions: Secondary | ICD-10-CM | POA: Diagnosis not present

## 2019-10-11 DIAGNOSIS — M6281 Muscle weakness (generalized): Secondary | ICD-10-CM | POA: Diagnosis not present

## 2019-10-11 DIAGNOSIS — R2681 Unsteadiness on feet: Secondary | ICD-10-CM | POA: Diagnosis not present

## 2019-10-11 DIAGNOSIS — G20 Parkinson's disease: Secondary | ICD-10-CM | POA: Diagnosis not present

## 2019-10-11 DIAGNOSIS — R1312 Dysphagia, oropharyngeal phase: Secondary | ICD-10-CM | POA: Diagnosis not present

## 2019-10-12 DIAGNOSIS — R2681 Unsteadiness on feet: Secondary | ICD-10-CM | POA: Diagnosis not present

## 2019-10-12 DIAGNOSIS — M6281 Muscle weakness (generalized): Secondary | ICD-10-CM | POA: Diagnosis not present

## 2019-10-12 DIAGNOSIS — E559 Vitamin D deficiency, unspecified: Secondary | ICD-10-CM | POA: Diagnosis not present

## 2019-10-12 DIAGNOSIS — I4891 Unspecified atrial fibrillation: Secondary | ICD-10-CM | POA: Diagnosis not present

## 2019-10-12 DIAGNOSIS — G20 Parkinson's disease: Secondary | ICD-10-CM | POA: Diagnosis not present

## 2019-10-12 DIAGNOSIS — D649 Anemia, unspecified: Secondary | ICD-10-CM | POA: Diagnosis not present

## 2019-10-12 DIAGNOSIS — E039 Hypothyroidism, unspecified: Secondary | ICD-10-CM | POA: Diagnosis not present

## 2019-10-12 DIAGNOSIS — R279 Unspecified lack of coordination: Secondary | ICD-10-CM | POA: Diagnosis not present

## 2019-10-12 DIAGNOSIS — R488 Other symbolic dysfunctions: Secondary | ICD-10-CM | POA: Diagnosis not present

## 2019-10-12 DIAGNOSIS — I509 Heart failure, unspecified: Secondary | ICD-10-CM | POA: Diagnosis not present

## 2019-10-12 DIAGNOSIS — R1312 Dysphagia, oropharyngeal phase: Secondary | ICD-10-CM | POA: Diagnosis not present

## 2019-10-13 DIAGNOSIS — R279 Unspecified lack of coordination: Secondary | ICD-10-CM | POA: Diagnosis not present

## 2019-10-13 DIAGNOSIS — E785 Hyperlipidemia, unspecified: Secondary | ICD-10-CM | POA: Diagnosis not present

## 2019-10-13 DIAGNOSIS — R1312 Dysphagia, oropharyngeal phase: Secondary | ICD-10-CM | POA: Diagnosis not present

## 2019-10-13 DIAGNOSIS — R7303 Prediabetes: Secondary | ICD-10-CM | POA: Diagnosis not present

## 2019-10-13 DIAGNOSIS — R488 Other symbolic dysfunctions: Secondary | ICD-10-CM | POA: Diagnosis not present

## 2019-10-13 DIAGNOSIS — D649 Anemia, unspecified: Secondary | ICD-10-CM | POA: Diagnosis not present

## 2019-10-13 DIAGNOSIS — M6281 Muscle weakness (generalized): Secondary | ICD-10-CM | POA: Diagnosis not present

## 2019-10-13 DIAGNOSIS — M545 Low back pain: Secondary | ICD-10-CM | POA: Diagnosis not present

## 2019-10-13 DIAGNOSIS — R2681 Unsteadiness on feet: Secondary | ICD-10-CM | POA: Diagnosis not present

## 2019-10-13 DIAGNOSIS — G20 Parkinson's disease: Secondary | ICD-10-CM | POA: Diagnosis not present

## 2019-10-14 DIAGNOSIS — R1312 Dysphagia, oropharyngeal phase: Secondary | ICD-10-CM | POA: Diagnosis not present

## 2019-10-14 DIAGNOSIS — R279 Unspecified lack of coordination: Secondary | ICD-10-CM | POA: Diagnosis not present

## 2019-10-14 DIAGNOSIS — R2681 Unsteadiness on feet: Secondary | ICD-10-CM | POA: Diagnosis not present

## 2019-10-14 DIAGNOSIS — M6281 Muscle weakness (generalized): Secondary | ICD-10-CM | POA: Diagnosis not present

## 2019-10-14 DIAGNOSIS — R488 Other symbolic dysfunctions: Secondary | ICD-10-CM | POA: Diagnosis not present

## 2019-10-14 DIAGNOSIS — G20 Parkinson's disease: Secondary | ICD-10-CM | POA: Diagnosis not present

## 2019-10-15 DIAGNOSIS — R1312 Dysphagia, oropharyngeal phase: Secondary | ICD-10-CM | POA: Diagnosis not present

## 2019-10-15 DIAGNOSIS — R2681 Unsteadiness on feet: Secondary | ICD-10-CM | POA: Diagnosis not present

## 2019-10-15 DIAGNOSIS — G20 Parkinson's disease: Secondary | ICD-10-CM | POA: Diagnosis not present

## 2019-10-15 DIAGNOSIS — M6281 Muscle weakness (generalized): Secondary | ICD-10-CM | POA: Diagnosis not present

## 2019-10-15 DIAGNOSIS — R488 Other symbolic dysfunctions: Secondary | ICD-10-CM | POA: Diagnosis not present

## 2019-10-15 DIAGNOSIS — R279 Unspecified lack of coordination: Secondary | ICD-10-CM | POA: Diagnosis not present

## 2019-10-16 DIAGNOSIS — R488 Other symbolic dysfunctions: Secondary | ICD-10-CM | POA: Diagnosis not present

## 2019-10-16 DIAGNOSIS — R2681 Unsteadiness on feet: Secondary | ICD-10-CM | POA: Diagnosis not present

## 2019-10-16 DIAGNOSIS — R1312 Dysphagia, oropharyngeal phase: Secondary | ICD-10-CM | POA: Diagnosis not present

## 2019-10-16 DIAGNOSIS — R279 Unspecified lack of coordination: Secondary | ICD-10-CM | POA: Diagnosis not present

## 2019-10-16 DIAGNOSIS — G20 Parkinson's disease: Secondary | ICD-10-CM | POA: Diagnosis not present

## 2019-10-16 DIAGNOSIS — M6281 Muscle weakness (generalized): Secondary | ICD-10-CM | POA: Diagnosis not present

## 2019-10-17 DIAGNOSIS — G20 Parkinson's disease: Secondary | ICD-10-CM | POA: Diagnosis not present

## 2019-10-17 DIAGNOSIS — R279 Unspecified lack of coordination: Secondary | ICD-10-CM | POA: Diagnosis not present

## 2019-10-17 DIAGNOSIS — R1312 Dysphagia, oropharyngeal phase: Secondary | ICD-10-CM | POA: Diagnosis not present

## 2019-10-17 DIAGNOSIS — R488 Other symbolic dysfunctions: Secondary | ICD-10-CM | POA: Diagnosis not present

## 2019-10-17 DIAGNOSIS — M6281 Muscle weakness (generalized): Secondary | ICD-10-CM | POA: Diagnosis not present

## 2019-10-17 DIAGNOSIS — R2681 Unsteadiness on feet: Secondary | ICD-10-CM | POA: Diagnosis not present

## 2019-10-18 DIAGNOSIS — R2681 Unsteadiness on feet: Secondary | ICD-10-CM | POA: Diagnosis not present

## 2019-10-18 DIAGNOSIS — G20 Parkinson's disease: Secondary | ICD-10-CM | POA: Diagnosis not present

## 2019-10-18 DIAGNOSIS — M545 Low back pain: Secondary | ICD-10-CM | POA: Diagnosis not present

## 2019-10-18 DIAGNOSIS — R279 Unspecified lack of coordination: Secondary | ICD-10-CM | POA: Diagnosis not present

## 2019-10-18 DIAGNOSIS — R488 Other symbolic dysfunctions: Secondary | ICD-10-CM | POA: Diagnosis not present

## 2019-10-18 DIAGNOSIS — R1312 Dysphagia, oropharyngeal phase: Secondary | ICD-10-CM | POA: Diagnosis not present

## 2019-10-18 DIAGNOSIS — M6281 Muscle weakness (generalized): Secondary | ICD-10-CM | POA: Diagnosis not present

## 2019-10-19 DIAGNOSIS — R2681 Unsteadiness on feet: Secondary | ICD-10-CM | POA: Diagnosis not present

## 2019-10-19 DIAGNOSIS — R279 Unspecified lack of coordination: Secondary | ICD-10-CM | POA: Diagnosis not present

## 2019-10-19 DIAGNOSIS — G20 Parkinson's disease: Secondary | ICD-10-CM | POA: Diagnosis not present

## 2019-10-19 DIAGNOSIS — R488 Other symbolic dysfunctions: Secondary | ICD-10-CM | POA: Diagnosis not present

## 2019-10-19 DIAGNOSIS — R1312 Dysphagia, oropharyngeal phase: Secondary | ICD-10-CM | POA: Diagnosis not present

## 2019-10-19 DIAGNOSIS — M6281 Muscle weakness (generalized): Secondary | ICD-10-CM | POA: Diagnosis not present

## 2019-10-20 DIAGNOSIS — R2681 Unsteadiness on feet: Secondary | ICD-10-CM | POA: Diagnosis not present

## 2019-10-20 DIAGNOSIS — R1312 Dysphagia, oropharyngeal phase: Secondary | ICD-10-CM | POA: Diagnosis not present

## 2019-10-20 DIAGNOSIS — R488 Other symbolic dysfunctions: Secondary | ICD-10-CM | POA: Diagnosis not present

## 2019-10-20 DIAGNOSIS — M6281 Muscle weakness (generalized): Secondary | ICD-10-CM | POA: Diagnosis not present

## 2019-10-20 DIAGNOSIS — R279 Unspecified lack of coordination: Secondary | ICD-10-CM | POA: Diagnosis not present

## 2019-10-20 DIAGNOSIS — G20 Parkinson's disease: Secondary | ICD-10-CM | POA: Diagnosis not present

## 2019-10-21 DIAGNOSIS — R1312 Dysphagia, oropharyngeal phase: Secondary | ICD-10-CM | POA: Diagnosis not present

## 2019-10-21 DIAGNOSIS — R279 Unspecified lack of coordination: Secondary | ICD-10-CM | POA: Diagnosis not present

## 2019-10-21 DIAGNOSIS — G20 Parkinson's disease: Secondary | ICD-10-CM | POA: Diagnosis not present

## 2019-10-21 DIAGNOSIS — R488 Other symbolic dysfunctions: Secondary | ICD-10-CM | POA: Diagnosis not present

## 2019-10-21 DIAGNOSIS — R2681 Unsteadiness on feet: Secondary | ICD-10-CM | POA: Diagnosis not present

## 2019-10-21 DIAGNOSIS — M6281 Muscle weakness (generalized): Secondary | ICD-10-CM | POA: Diagnosis not present

## 2019-10-22 DIAGNOSIS — R488 Other symbolic dysfunctions: Secondary | ICD-10-CM | POA: Diagnosis not present

## 2019-10-22 DIAGNOSIS — R1312 Dysphagia, oropharyngeal phase: Secondary | ICD-10-CM | POA: Diagnosis not present

## 2019-10-22 DIAGNOSIS — G20 Parkinson's disease: Secondary | ICD-10-CM | POA: Diagnosis not present

## 2019-10-22 DIAGNOSIS — R279 Unspecified lack of coordination: Secondary | ICD-10-CM | POA: Diagnosis not present

## 2019-10-22 DIAGNOSIS — M6281 Muscle weakness (generalized): Secondary | ICD-10-CM | POA: Diagnosis not present

## 2019-10-22 DIAGNOSIS — R2681 Unsteadiness on feet: Secondary | ICD-10-CM | POA: Diagnosis not present

## 2019-10-23 DIAGNOSIS — R488 Other symbolic dysfunctions: Secondary | ICD-10-CM | POA: Diagnosis not present

## 2019-10-23 DIAGNOSIS — R279 Unspecified lack of coordination: Secondary | ICD-10-CM | POA: Diagnosis not present

## 2019-10-23 DIAGNOSIS — M6281 Muscle weakness (generalized): Secondary | ICD-10-CM | POA: Diagnosis not present

## 2019-10-23 DIAGNOSIS — R1312 Dysphagia, oropharyngeal phase: Secondary | ICD-10-CM | POA: Diagnosis not present

## 2019-10-23 DIAGNOSIS — G20 Parkinson's disease: Secondary | ICD-10-CM | POA: Diagnosis not present

## 2019-10-23 DIAGNOSIS — R2681 Unsteadiness on feet: Secondary | ICD-10-CM | POA: Diagnosis not present

## 2019-10-24 DIAGNOSIS — R279 Unspecified lack of coordination: Secondary | ICD-10-CM | POA: Diagnosis not present

## 2019-10-24 DIAGNOSIS — R488 Other symbolic dysfunctions: Secondary | ICD-10-CM | POA: Diagnosis not present

## 2019-10-24 DIAGNOSIS — R1312 Dysphagia, oropharyngeal phase: Secondary | ICD-10-CM | POA: Diagnosis not present

## 2019-10-24 DIAGNOSIS — R2681 Unsteadiness on feet: Secondary | ICD-10-CM | POA: Diagnosis not present

## 2019-10-24 DIAGNOSIS — G20 Parkinson's disease: Secondary | ICD-10-CM | POA: Diagnosis not present

## 2019-10-24 DIAGNOSIS — M6281 Muscle weakness (generalized): Secondary | ICD-10-CM | POA: Diagnosis not present

## 2019-10-25 DIAGNOSIS — R279 Unspecified lack of coordination: Secondary | ICD-10-CM | POA: Diagnosis not present

## 2019-10-25 DIAGNOSIS — R2681 Unsteadiness on feet: Secondary | ICD-10-CM | POA: Diagnosis not present

## 2019-10-25 DIAGNOSIS — R1312 Dysphagia, oropharyngeal phase: Secondary | ICD-10-CM | POA: Diagnosis not present

## 2019-10-25 DIAGNOSIS — R488 Other symbolic dysfunctions: Secondary | ICD-10-CM | POA: Diagnosis not present

## 2019-10-25 DIAGNOSIS — M6281 Muscle weakness (generalized): Secondary | ICD-10-CM | POA: Diagnosis not present

## 2019-10-25 DIAGNOSIS — G20 Parkinson's disease: Secondary | ICD-10-CM | POA: Diagnosis not present

## 2019-10-26 DIAGNOSIS — R2681 Unsteadiness on feet: Secondary | ICD-10-CM | POA: Diagnosis not present

## 2019-10-26 DIAGNOSIS — M6281 Muscle weakness (generalized): Secondary | ICD-10-CM | POA: Diagnosis not present

## 2019-10-26 DIAGNOSIS — R488 Other symbolic dysfunctions: Secondary | ICD-10-CM | POA: Diagnosis not present

## 2019-10-26 DIAGNOSIS — G20 Parkinson's disease: Secondary | ICD-10-CM | POA: Diagnosis not present

## 2019-10-26 DIAGNOSIS — R279 Unspecified lack of coordination: Secondary | ICD-10-CM | POA: Diagnosis not present

## 2019-10-26 DIAGNOSIS — R1312 Dysphagia, oropharyngeal phase: Secondary | ICD-10-CM | POA: Diagnosis not present

## 2019-10-27 DIAGNOSIS — M25519 Pain in unspecified shoulder: Secondary | ICD-10-CM | POA: Diagnosis not present

## 2019-10-27 DIAGNOSIS — R300 Dysuria: Secondary | ICD-10-CM | POA: Diagnosis not present

## 2019-10-27 DIAGNOSIS — R279 Unspecified lack of coordination: Secondary | ICD-10-CM | POA: Diagnosis not present

## 2019-10-27 DIAGNOSIS — R2681 Unsteadiness on feet: Secondary | ICD-10-CM | POA: Diagnosis not present

## 2019-10-27 DIAGNOSIS — Z9181 History of falling: Secondary | ICD-10-CM | POA: Diagnosis not present

## 2019-10-27 DIAGNOSIS — F0391 Unspecified dementia with behavioral disturbance: Secondary | ICD-10-CM | POA: Diagnosis not present

## 2019-10-27 DIAGNOSIS — G20 Parkinson's disease: Secondary | ICD-10-CM | POA: Diagnosis not present

## 2019-10-27 DIAGNOSIS — M6281 Muscle weakness (generalized): Secondary | ICD-10-CM | POA: Diagnosis not present

## 2019-10-27 DIAGNOSIS — R488 Other symbolic dysfunctions: Secondary | ICD-10-CM | POA: Diagnosis not present

## 2019-10-27 DIAGNOSIS — R1312 Dysphagia, oropharyngeal phase: Secondary | ICD-10-CM | POA: Diagnosis not present

## 2019-10-27 DIAGNOSIS — N139 Obstructive and reflux uropathy, unspecified: Secondary | ICD-10-CM | POA: Diagnosis not present

## 2019-10-27 DIAGNOSIS — I959 Hypotension, unspecified: Secondary | ICD-10-CM | POA: Diagnosis not present

## 2019-10-28 DIAGNOSIS — N400 Enlarged prostate without lower urinary tract symptoms: Secondary | ICD-10-CM | POA: Diagnosis not present

## 2019-10-28 DIAGNOSIS — N390 Urinary tract infection, site not specified: Secondary | ICD-10-CM | POA: Diagnosis not present

## 2019-10-28 DIAGNOSIS — R300 Dysuria: Secondary | ICD-10-CM | POA: Diagnosis not present

## 2019-10-28 DIAGNOSIS — I110 Hypertensive heart disease with heart failure: Secondary | ICD-10-CM | POA: Diagnosis not present

## 2019-10-28 DIAGNOSIS — N139 Obstructive and reflux uropathy, unspecified: Secondary | ICD-10-CM | POA: Diagnosis not present

## 2019-10-29 DIAGNOSIS — G20 Parkinson's disease: Secondary | ICD-10-CM | POA: Diagnosis not present

## 2019-10-29 DIAGNOSIS — R488 Other symbolic dysfunctions: Secondary | ICD-10-CM | POA: Diagnosis not present

## 2019-10-29 DIAGNOSIS — R1312 Dysphagia, oropharyngeal phase: Secondary | ICD-10-CM | POA: Diagnosis not present

## 2019-10-29 DIAGNOSIS — M6281 Muscle weakness (generalized): Secondary | ICD-10-CM | POA: Diagnosis not present

## 2019-10-29 DIAGNOSIS — R2681 Unsteadiness on feet: Secondary | ICD-10-CM | POA: Diagnosis not present

## 2019-10-29 DIAGNOSIS — M25519 Pain in unspecified shoulder: Secondary | ICD-10-CM | POA: Diagnosis not present

## 2019-10-30 DIAGNOSIS — M6281 Muscle weakness (generalized): Secondary | ICD-10-CM | POA: Diagnosis not present

## 2019-10-30 DIAGNOSIS — R488 Other symbolic dysfunctions: Secondary | ICD-10-CM | POA: Diagnosis not present

## 2019-10-30 DIAGNOSIS — M25519 Pain in unspecified shoulder: Secondary | ICD-10-CM | POA: Diagnosis not present

## 2019-10-30 DIAGNOSIS — R1312 Dysphagia, oropharyngeal phase: Secondary | ICD-10-CM | POA: Diagnosis not present

## 2019-10-30 DIAGNOSIS — R2681 Unsteadiness on feet: Secondary | ICD-10-CM | POA: Diagnosis not present

## 2019-10-30 DIAGNOSIS — G20 Parkinson's disease: Secondary | ICD-10-CM | POA: Diagnosis not present

## 2019-10-31 DIAGNOSIS — R1312 Dysphagia, oropharyngeal phase: Secondary | ICD-10-CM | POA: Diagnosis not present

## 2019-10-31 DIAGNOSIS — M545 Low back pain: Secondary | ICD-10-CM | POA: Diagnosis not present

## 2019-10-31 DIAGNOSIS — M6281 Muscle weakness (generalized): Secondary | ICD-10-CM | POA: Diagnosis not present

## 2019-10-31 DIAGNOSIS — N401 Enlarged prostate with lower urinary tract symptoms: Secondary | ICD-10-CM | POA: Diagnosis not present

## 2019-10-31 DIAGNOSIS — R2681 Unsteadiness on feet: Secondary | ICD-10-CM | POA: Diagnosis not present

## 2019-10-31 DIAGNOSIS — E039 Hypothyroidism, unspecified: Secondary | ICD-10-CM | POA: Diagnosis not present

## 2019-10-31 DIAGNOSIS — I4891 Unspecified atrial fibrillation: Secondary | ICD-10-CM | POA: Diagnosis not present

## 2019-10-31 DIAGNOSIS — R488 Other symbolic dysfunctions: Secondary | ICD-10-CM | POA: Diagnosis not present

## 2019-10-31 DIAGNOSIS — G20 Parkinson's disease: Secondary | ICD-10-CM | POA: Diagnosis not present

## 2019-10-31 DIAGNOSIS — I509 Heart failure, unspecified: Secondary | ICD-10-CM | POA: Diagnosis not present

## 2019-10-31 DIAGNOSIS — I110 Hypertensive heart disease with heart failure: Secondary | ICD-10-CM | POA: Diagnosis not present

## 2019-10-31 DIAGNOSIS — R630 Anorexia: Secondary | ICD-10-CM | POA: Diagnosis not present

## 2019-10-31 DIAGNOSIS — N138 Other obstructive and reflux uropathy: Secondary | ICD-10-CM | POA: Diagnosis not present

## 2019-10-31 DIAGNOSIS — M25519 Pain in unspecified shoulder: Secondary | ICD-10-CM | POA: Diagnosis not present

## 2019-11-01 DIAGNOSIS — M25519 Pain in unspecified shoulder: Secondary | ICD-10-CM | POA: Diagnosis not present

## 2019-11-01 DIAGNOSIS — R2681 Unsteadiness on feet: Secondary | ICD-10-CM | POA: Diagnosis not present

## 2019-11-01 DIAGNOSIS — R1312 Dysphagia, oropharyngeal phase: Secondary | ICD-10-CM | POA: Diagnosis not present

## 2019-11-01 DIAGNOSIS — R488 Other symbolic dysfunctions: Secondary | ICD-10-CM | POA: Diagnosis not present

## 2019-11-01 DIAGNOSIS — G20 Parkinson's disease: Secondary | ICD-10-CM | POA: Diagnosis not present

## 2019-11-01 DIAGNOSIS — M6281 Muscle weakness (generalized): Secondary | ICD-10-CM | POA: Diagnosis not present

## 2019-11-02 DIAGNOSIS — M25519 Pain in unspecified shoulder: Secondary | ICD-10-CM | POA: Diagnosis not present

## 2019-11-02 DIAGNOSIS — M6281 Muscle weakness (generalized): Secondary | ICD-10-CM | POA: Diagnosis not present

## 2019-11-02 DIAGNOSIS — R1312 Dysphagia, oropharyngeal phase: Secondary | ICD-10-CM | POA: Diagnosis not present

## 2019-11-02 DIAGNOSIS — R488 Other symbolic dysfunctions: Secondary | ICD-10-CM | POA: Diagnosis not present

## 2019-11-02 DIAGNOSIS — G20 Parkinson's disease: Secondary | ICD-10-CM | POA: Diagnosis not present

## 2019-11-02 DIAGNOSIS — R2681 Unsteadiness on feet: Secondary | ICD-10-CM | POA: Diagnosis not present

## 2019-11-03 DIAGNOSIS — M6281 Muscle weakness (generalized): Secondary | ICD-10-CM | POA: Diagnosis not present

## 2019-11-03 DIAGNOSIS — R1312 Dysphagia, oropharyngeal phase: Secondary | ICD-10-CM | POA: Diagnosis not present

## 2019-11-03 DIAGNOSIS — R488 Other symbolic dysfunctions: Secondary | ICD-10-CM | POA: Diagnosis not present

## 2019-11-03 DIAGNOSIS — G20 Parkinson's disease: Secondary | ICD-10-CM | POA: Diagnosis not present

## 2019-11-03 DIAGNOSIS — M25519 Pain in unspecified shoulder: Secondary | ICD-10-CM | POA: Diagnosis not present

## 2019-11-03 DIAGNOSIS — R2681 Unsteadiness on feet: Secondary | ICD-10-CM | POA: Diagnosis not present

## 2019-11-04 DIAGNOSIS — R488 Other symbolic dysfunctions: Secondary | ICD-10-CM | POA: Diagnosis not present

## 2019-11-04 DIAGNOSIS — M6281 Muscle weakness (generalized): Secondary | ICD-10-CM | POA: Diagnosis not present

## 2019-11-04 DIAGNOSIS — N139 Obstructive and reflux uropathy, unspecified: Secondary | ICD-10-CM | POA: Diagnosis not present

## 2019-11-04 DIAGNOSIS — M25519 Pain in unspecified shoulder: Secondary | ICD-10-CM | POA: Diagnosis not present

## 2019-11-04 DIAGNOSIS — N390 Urinary tract infection, site not specified: Secondary | ICD-10-CM | POA: Diagnosis not present

## 2019-11-04 DIAGNOSIS — R1312 Dysphagia, oropharyngeal phase: Secondary | ICD-10-CM | POA: Diagnosis not present

## 2019-11-04 DIAGNOSIS — R2681 Unsteadiness on feet: Secondary | ICD-10-CM | POA: Diagnosis not present

## 2019-11-04 DIAGNOSIS — E039 Hypothyroidism, unspecified: Secondary | ICD-10-CM | POA: Diagnosis not present

## 2019-11-04 DIAGNOSIS — I4891 Unspecified atrial fibrillation: Secondary | ICD-10-CM | POA: Diagnosis not present

## 2019-11-04 DIAGNOSIS — G20 Parkinson's disease: Secondary | ICD-10-CM | POA: Diagnosis not present

## 2019-11-05 DIAGNOSIS — M6281 Muscle weakness (generalized): Secondary | ICD-10-CM | POA: Diagnosis not present

## 2019-11-05 DIAGNOSIS — R488 Other symbolic dysfunctions: Secondary | ICD-10-CM | POA: Diagnosis not present

## 2019-11-05 DIAGNOSIS — G20 Parkinson's disease: Secondary | ICD-10-CM | POA: Diagnosis not present

## 2019-11-05 DIAGNOSIS — M25519 Pain in unspecified shoulder: Secondary | ICD-10-CM | POA: Diagnosis not present

## 2019-11-05 DIAGNOSIS — R2681 Unsteadiness on feet: Secondary | ICD-10-CM | POA: Diagnosis not present

## 2019-11-05 DIAGNOSIS — R1312 Dysphagia, oropharyngeal phase: Secondary | ICD-10-CM | POA: Diagnosis not present

## 2019-11-06 DIAGNOSIS — M6281 Muscle weakness (generalized): Secondary | ICD-10-CM | POA: Diagnosis not present

## 2019-11-06 DIAGNOSIS — M25519 Pain in unspecified shoulder: Secondary | ICD-10-CM | POA: Diagnosis not present

## 2019-11-06 DIAGNOSIS — R1312 Dysphagia, oropharyngeal phase: Secondary | ICD-10-CM | POA: Diagnosis not present

## 2019-11-06 DIAGNOSIS — G20 Parkinson's disease: Secondary | ICD-10-CM | POA: Diagnosis not present

## 2019-11-06 DIAGNOSIS — R2681 Unsteadiness on feet: Secondary | ICD-10-CM | POA: Diagnosis not present

## 2019-11-06 DIAGNOSIS — R488 Other symbolic dysfunctions: Secondary | ICD-10-CM | POA: Diagnosis not present

## 2019-11-07 DIAGNOSIS — G3183 Dementia with Lewy bodies: Secondary | ICD-10-CM | POA: Diagnosis not present

## 2019-11-07 DIAGNOSIS — R488 Other symbolic dysfunctions: Secondary | ICD-10-CM | POA: Diagnosis not present

## 2019-11-07 DIAGNOSIS — M25519 Pain in unspecified shoulder: Secondary | ICD-10-CM | POA: Diagnosis not present

## 2019-11-07 DIAGNOSIS — R042 Hemoptysis: Secondary | ICD-10-CM | POA: Diagnosis not present

## 2019-11-07 DIAGNOSIS — R0989 Other specified symptoms and signs involving the circulatory and respiratory systems: Secondary | ICD-10-CM | POA: Diagnosis not present

## 2019-11-07 DIAGNOSIS — F0280 Dementia in other diseases classified elsewhere without behavioral disturbance: Secondary | ICD-10-CM | POA: Diagnosis not present

## 2019-11-07 DIAGNOSIS — F329 Major depressive disorder, single episode, unspecified: Secondary | ICD-10-CM | POA: Diagnosis not present

## 2019-11-07 DIAGNOSIS — M6281 Muscle weakness (generalized): Secondary | ICD-10-CM | POA: Diagnosis not present

## 2019-11-07 DIAGNOSIS — R2681 Unsteadiness on feet: Secondary | ICD-10-CM | POA: Diagnosis not present

## 2019-11-07 DIAGNOSIS — R1312 Dysphagia, oropharyngeal phase: Secondary | ICD-10-CM | POA: Diagnosis not present

## 2019-11-07 DIAGNOSIS — G20 Parkinson's disease: Secondary | ICD-10-CM | POA: Diagnosis not present

## 2019-11-08 DIAGNOSIS — J189 Pneumonia, unspecified organism: Secondary | ICD-10-CM | POA: Diagnosis not present

## 2019-11-11 DIAGNOSIS — R7303 Prediabetes: Secondary | ICD-10-CM | POA: Diagnosis not present

## 2019-11-11 DIAGNOSIS — R2681 Unsteadiness on feet: Secondary | ICD-10-CM | POA: Diagnosis not present

## 2019-11-11 DIAGNOSIS — G20 Parkinson's disease: Secondary | ICD-10-CM | POA: Diagnosis not present

## 2019-11-11 DIAGNOSIS — E039 Hypothyroidism, unspecified: Secondary | ICD-10-CM | POA: Diagnosis not present

## 2019-11-11 DIAGNOSIS — M25519 Pain in unspecified shoulder: Secondary | ICD-10-CM | POA: Diagnosis not present

## 2019-11-11 DIAGNOSIS — Z20828 Contact with and (suspected) exposure to other viral communicable diseases: Secondary | ICD-10-CM | POA: Diagnosis not present

## 2019-11-11 DIAGNOSIS — M6281 Muscle weakness (generalized): Secondary | ICD-10-CM | POA: Diagnosis not present

## 2019-11-11 DIAGNOSIS — R1312 Dysphagia, oropharyngeal phase: Secondary | ICD-10-CM | POA: Diagnosis not present

## 2019-11-11 DIAGNOSIS — R488 Other symbolic dysfunctions: Secondary | ICD-10-CM | POA: Diagnosis not present

## 2019-11-11 DIAGNOSIS — E785 Hyperlipidemia, unspecified: Secondary | ICD-10-CM | POA: Diagnosis not present

## 2019-11-14 DIAGNOSIS — R403 Persistent vegetative state: Secondary | ICD-10-CM | POA: Diagnosis not present

## 2019-11-14 DIAGNOSIS — R488 Other symbolic dysfunctions: Secondary | ICD-10-CM | POA: Diagnosis not present

## 2019-11-14 DIAGNOSIS — G20 Parkinson's disease: Secondary | ICD-10-CM | POA: Diagnosis not present

## 2019-11-14 DIAGNOSIS — R2681 Unsteadiness on feet: Secondary | ICD-10-CM | POA: Diagnosis not present

## 2019-11-14 DIAGNOSIS — Z789 Other specified health status: Secondary | ICD-10-CM | POA: Diagnosis not present

## 2019-11-14 DIAGNOSIS — M6281 Muscle weakness (generalized): Secondary | ICD-10-CM | POA: Diagnosis not present

## 2019-11-14 DIAGNOSIS — R1312 Dysphagia, oropharyngeal phase: Secondary | ICD-10-CM | POA: Diagnosis not present

## 2019-11-14 DIAGNOSIS — M25519 Pain in unspecified shoulder: Secondary | ICD-10-CM | POA: Diagnosis not present

## 2019-11-14 DIAGNOSIS — R0902 Hypoxemia: Secondary | ICD-10-CM | POA: Diagnosis not present

## 2019-11-14 DIAGNOSIS — J189 Pneumonia, unspecified organism: Secondary | ICD-10-CM | POA: Diagnosis not present

## 2019-11-15 DIAGNOSIS — E785 Hyperlipidemia, unspecified: Secondary | ICD-10-CM | POA: Diagnosis not present

## 2019-11-15 DIAGNOSIS — Z950 Presence of cardiac pacemaker: Secondary | ICD-10-CM | POA: Diagnosis not present

## 2019-11-15 DIAGNOSIS — K219 Gastro-esophageal reflux disease without esophagitis: Secondary | ICD-10-CM | POA: Diagnosis not present

## 2019-11-15 DIAGNOSIS — M4800 Spinal stenosis, site unspecified: Secondary | ICD-10-CM | POA: Diagnosis not present

## 2019-11-15 DIAGNOSIS — Z9981 Dependence on supplemental oxygen: Secondary | ICD-10-CM | POA: Diagnosis not present

## 2019-11-15 DIAGNOSIS — G3183 Dementia with Lewy bodies: Secondary | ICD-10-CM | POA: Diagnosis not present

## 2019-11-15 DIAGNOSIS — J189 Pneumonia, unspecified organism: Secondary | ICD-10-CM | POA: Diagnosis not present

## 2019-11-15 DIAGNOSIS — D649 Anemia, unspecified: Secondary | ICD-10-CM | POA: Diagnosis not present

## 2019-11-15 DIAGNOSIS — L89621 Pressure ulcer of left heel, stage 1: Secondary | ICD-10-CM | POA: Diagnosis not present

## 2019-11-15 DIAGNOSIS — L89142 Pressure ulcer of left lower back, stage 2: Secondary | ICD-10-CM | POA: Diagnosis not present

## 2019-11-15 DIAGNOSIS — F329 Major depressive disorder, single episode, unspecified: Secondary | ICD-10-CM | POA: Diagnosis not present

## 2019-11-15 DIAGNOSIS — L89152 Pressure ulcer of sacral region, stage 2: Secondary | ICD-10-CM | POA: Diagnosis not present

## 2019-11-15 DIAGNOSIS — Z466 Encounter for fitting and adjustment of urinary device: Secondary | ICD-10-CM | POA: Diagnosis not present

## 2019-11-15 DIAGNOSIS — F0280 Dementia in other diseases classified elsewhere without behavioral disturbance: Secondary | ICD-10-CM | POA: Diagnosis not present

## 2019-11-15 DIAGNOSIS — R488 Other symbolic dysfunctions: Secondary | ICD-10-CM | POA: Diagnosis not present

## 2019-11-15 DIAGNOSIS — J302 Other seasonal allergic rhinitis: Secondary | ICD-10-CM | POA: Diagnosis not present

## 2019-11-15 DIAGNOSIS — Z8616 Personal history of COVID-19: Secondary | ICD-10-CM | POA: Diagnosis not present

## 2019-11-15 DIAGNOSIS — L89102 Pressure ulcer of unspecified part of back, stage 2: Secondary | ICD-10-CM | POA: Diagnosis not present

## 2019-11-15 DIAGNOSIS — R1312 Dysphagia, oropharyngeal phase: Secondary | ICD-10-CM | POA: Diagnosis not present

## 2019-11-15 DIAGNOSIS — R2681 Unsteadiness on feet: Secondary | ICD-10-CM | POA: Diagnosis not present

## 2019-11-15 DIAGNOSIS — M25519 Pain in unspecified shoulder: Secondary | ICD-10-CM | POA: Diagnosis not present

## 2019-11-15 DIAGNOSIS — E039 Hypothyroidism, unspecified: Secondary | ICD-10-CM | POA: Diagnosis not present

## 2019-11-15 DIAGNOSIS — I4891 Unspecified atrial fibrillation: Secondary | ICD-10-CM | POA: Diagnosis not present

## 2019-11-15 DIAGNOSIS — M6281 Muscle weakness (generalized): Secondary | ICD-10-CM | POA: Diagnosis not present

## 2019-11-15 DIAGNOSIS — N400 Enlarged prostate without lower urinary tract symptoms: Secondary | ICD-10-CM | POA: Diagnosis not present

## 2019-11-15 DIAGNOSIS — I509 Heart failure, unspecified: Secondary | ICD-10-CM | POA: Diagnosis not present

## 2019-11-15 DIAGNOSIS — J9691 Respiratory failure, unspecified with hypoxia: Secondary | ICD-10-CM | POA: Diagnosis not present

## 2019-11-15 DIAGNOSIS — I110 Hypertensive heart disease with heart failure: Secondary | ICD-10-CM | POA: Diagnosis not present

## 2019-11-15 DIAGNOSIS — G20 Parkinson's disease: Secondary | ICD-10-CM | POA: Diagnosis not present

## 2019-11-15 DIAGNOSIS — I25119 Atherosclerotic heart disease of native coronary artery with unspecified angina pectoris: Secondary | ICD-10-CM | POA: Diagnosis not present

## 2019-11-15 DIAGNOSIS — Z87891 Personal history of nicotine dependence: Secondary | ICD-10-CM | POA: Diagnosis not present

## 2019-11-16 DIAGNOSIS — J189 Pneumonia, unspecified organism: Secondary | ICD-10-CM | POA: Diagnosis not present

## 2019-11-16 DIAGNOSIS — J9691 Respiratory failure, unspecified with hypoxia: Secondary | ICD-10-CM | POA: Diagnosis not present

## 2019-11-16 DIAGNOSIS — I110 Hypertensive heart disease with heart failure: Secondary | ICD-10-CM | POA: Diagnosis not present

## 2019-11-16 DIAGNOSIS — I509 Heart failure, unspecified: Secondary | ICD-10-CM | POA: Diagnosis not present

## 2019-11-16 DIAGNOSIS — G3183 Dementia with Lewy bodies: Secondary | ICD-10-CM | POA: Diagnosis not present

## 2019-11-16 DIAGNOSIS — F0280 Dementia in other diseases classified elsewhere without behavioral disturbance: Secondary | ICD-10-CM | POA: Diagnosis not present

## 2019-11-17 DIAGNOSIS — J189 Pneumonia, unspecified organism: Secondary | ICD-10-CM | POA: Diagnosis not present

## 2019-11-17 DIAGNOSIS — J9691 Respiratory failure, unspecified with hypoxia: Secondary | ICD-10-CM | POA: Diagnosis not present

## 2019-11-17 DIAGNOSIS — I509 Heart failure, unspecified: Secondary | ICD-10-CM | POA: Diagnosis not present

## 2019-11-17 DIAGNOSIS — Z515 Encounter for palliative care: Secondary | ICD-10-CM | POA: Diagnosis not present

## 2019-11-17 DIAGNOSIS — F0280 Dementia in other diseases classified elsewhere without behavioral disturbance: Secondary | ICD-10-CM | POA: Diagnosis not present

## 2019-11-17 DIAGNOSIS — G3183 Dementia with Lewy bodies: Secondary | ICD-10-CM | POA: Diagnosis not present

## 2019-11-17 DIAGNOSIS — I110 Hypertensive heart disease with heart failure: Secondary | ICD-10-CM | POA: Diagnosis not present

## 2019-11-26 DEATH — deceased

## 2019-11-29 ENCOUNTER — Encounter: Payer: Self-pay | Admitting: Adult Health

## 2019-11-29 ENCOUNTER — Ambulatory Visit: Payer: Medicare Other | Admitting: Adult Health

## 2019-12-30 ENCOUNTER — Telehealth: Payer: Self-pay

## 2019-12-30 NOTE — Telephone Encounter (Signed)
Unable to speak  with patient to remind of missed remote transmission 

## 2020-01-29 IMAGING — CT CT CERVICAL SPINE W/O CM
4 of 6 series · 16 of 33 positions shown, 18 images · non-contrast
Comparison: Prior CT from 10/05/2017.

CLINICAL DATA: Initial evaluation for acute trauma, fall.

EXAM:
CT HEAD WITHOUT CONTRAST
CT CERVICAL SPINE WITHOUT CONTRAST
TECHNIQUE: Multidetector CT imaging of the head and cervical spine was
performed following the standard protocol without intravenous
contrast. Multiplanar CT image reconstructions of the cervical spine
were also generated.

[Series 4: head 5.0 h30s · axial · 0.42mm/px · z∈[-99,-44]mm · 2 of 35 slices shown]
[im 12/35  bone]
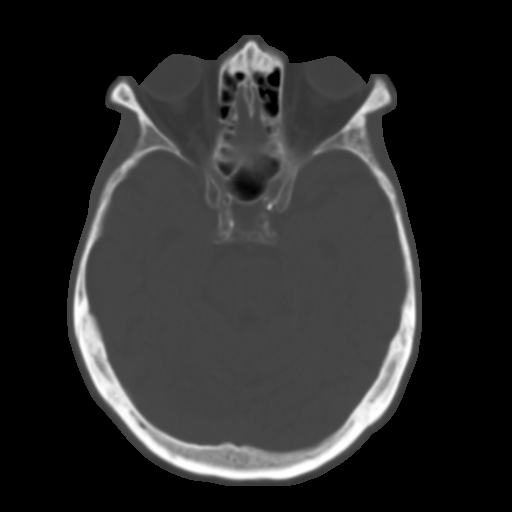
[im 23/35  bone]
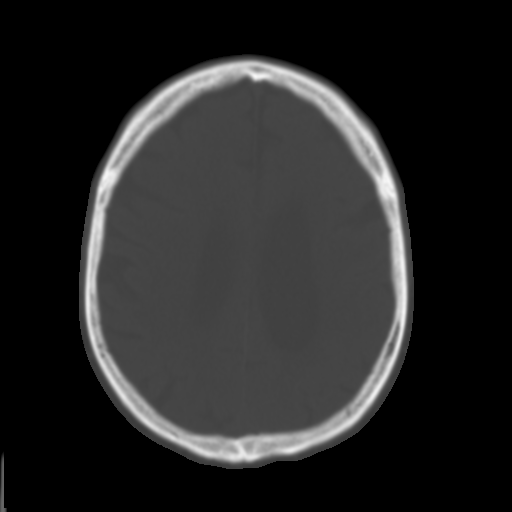

[Series 9: c_spine 2.0 st · axial · 0.33mm/px · z∈[-257,-129]mm · 8 of 84 slices shown, 10 images]
[im 10/84  soft-tissue]
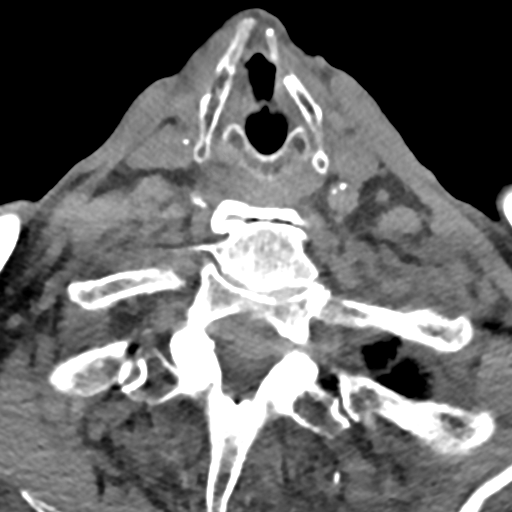
[im 10/84  bone]
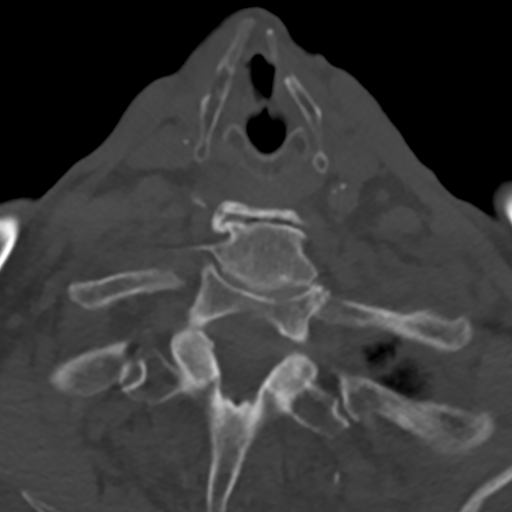
[im 19/84  bone]
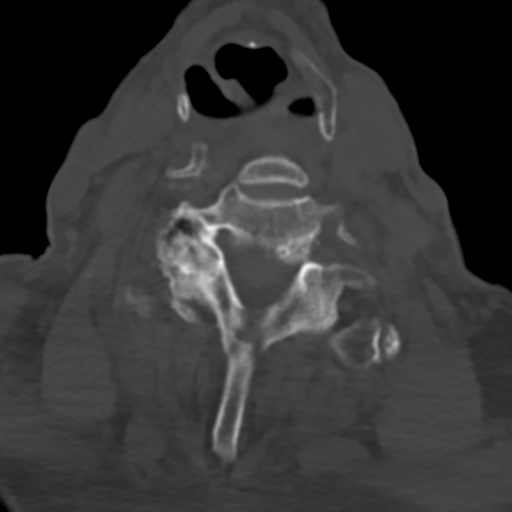
[im 28/84  bone]
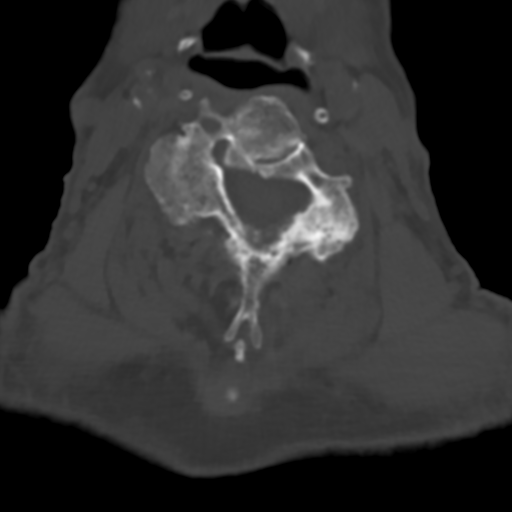
[im 37/84  bone]
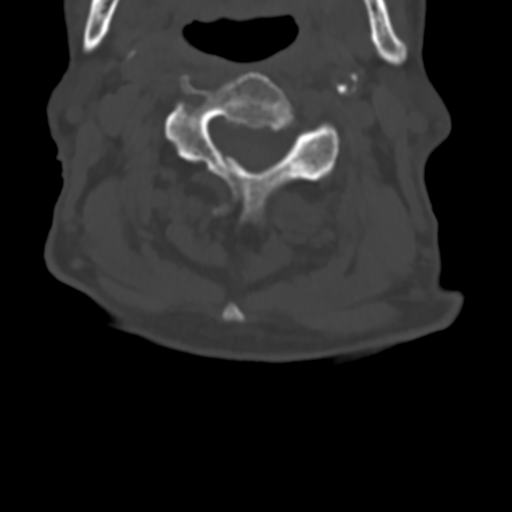
[im 47/84  soft-tissue]
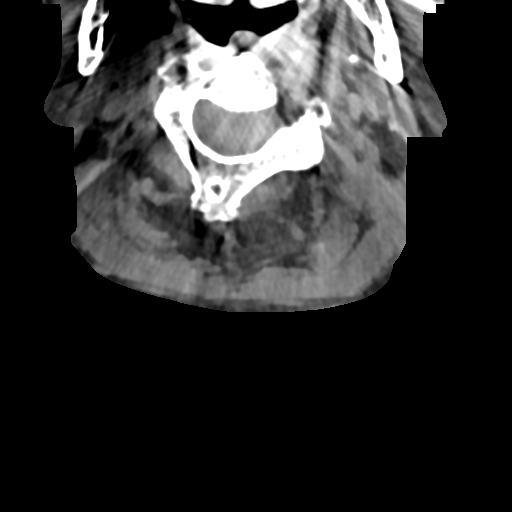
[im 47/84  bone]
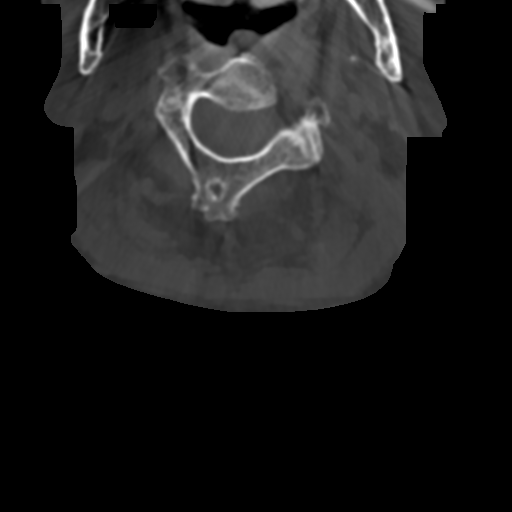
[im 56/84  bone]
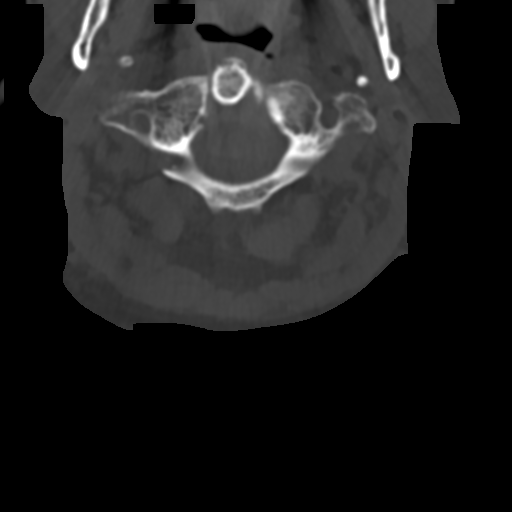
[im 65/84  bone]
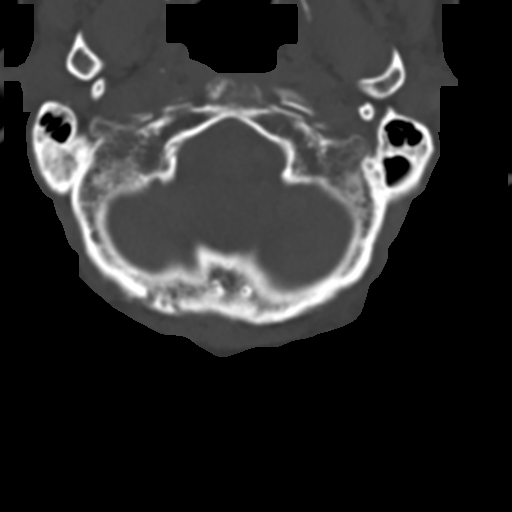
[im 74/84  bone]
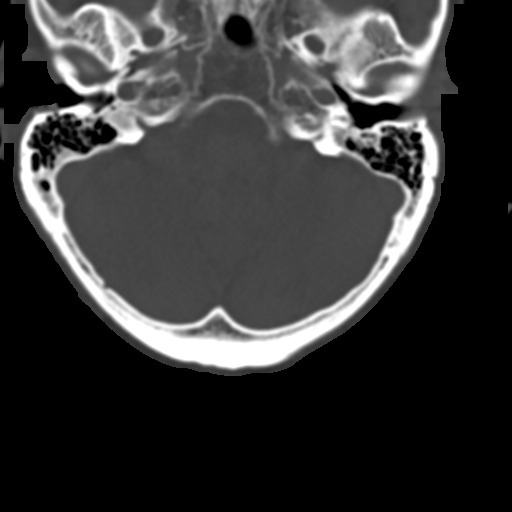

[Series 10: coronal bone · coronal · 0.23mm/px · 1 of 73 slices shown]
[im 37/73  bone]
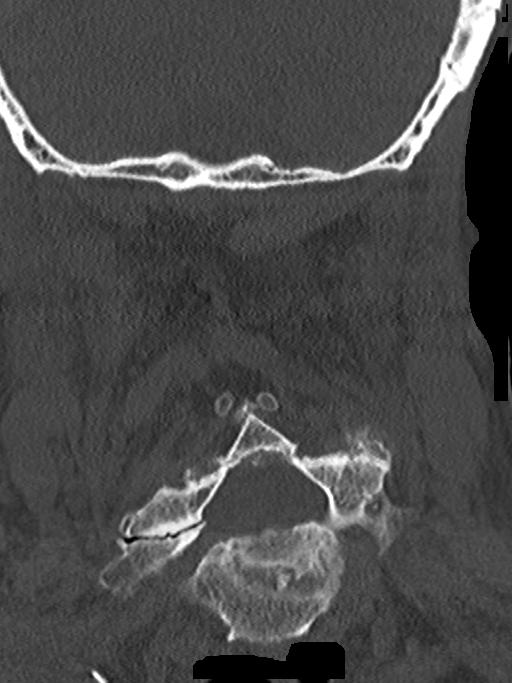

[Series 11: sagittal bone · sagittal · 0.31mm/px · 5 of 61 slices shown]
[im 11/61  bone]
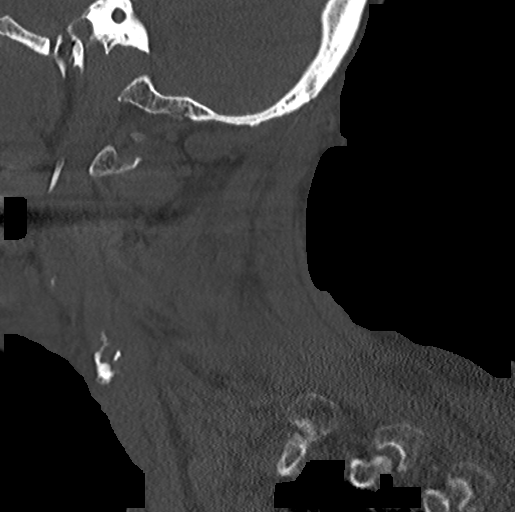
[im 21/61  bone]
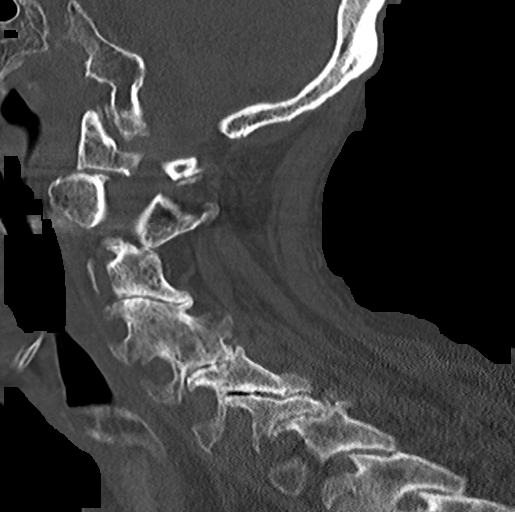
[im 31/61  bone]
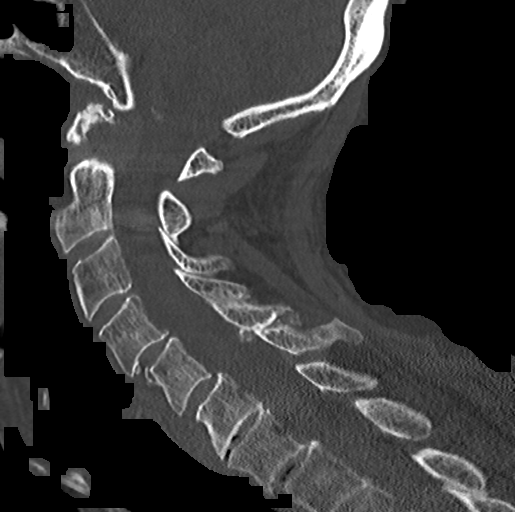
[im 41/61  bone]
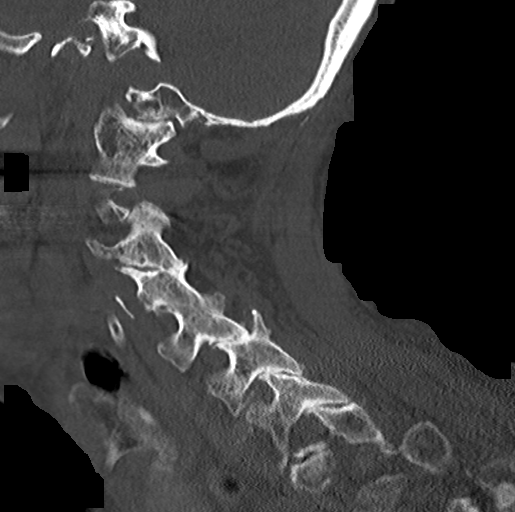
[im 51/61  bone]
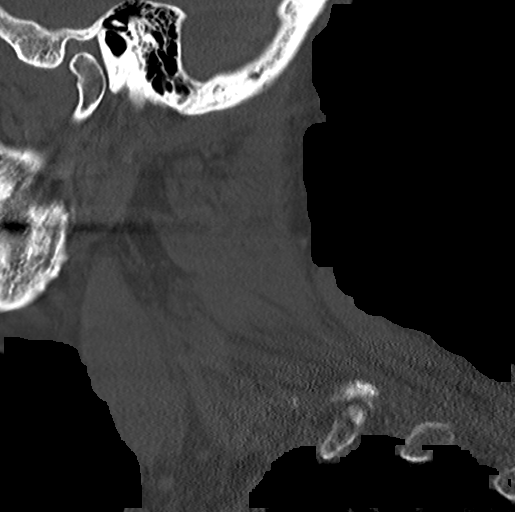

[16 of 33 positions shown; findings below may reference images not displayed]

FINDINGS: CT HEAD FINDINGS

Brain: In moderate cerebral atrophy with chronic small vessel
ischemic disease. Scatter remote lacunar infarcts present within the
bilateral thalami. Few small remote left cerebellar infarct.

No acute intracranial hemorrhage. No acute large vessel territory
infarct. No mass lesion, midline shift or mass effect. Ventricular
prominence related to global parenchymal volume loss of
hydrocephalus. No extra-axial fluid collection.

Vascular: No hyperdense vessel. Calcified atherosclerosis present at
the skull base.

Skull: No discernible scalp soft tissue injury.  Calvarium intact.

Sinuses/Orbits: Globes orbital soft tissues within normal limits.
Paranasal sinuses and mastoid air cells are clear.

Other: None.

CT CERVICAL SPINE FINDINGS

Alignment: Levoscoliosis with straightening of the normal cervical
lordosis. Trace anterolisthesis of C4 on C5, C5 on C6, and C7 on T1.

Skull base and vertebrae: Skull base intact. Mild rotation of C1 on
C2 likely positional. Normal C1-2 articulations otherwise preserved.
Dens intact. Vertebral body heights maintained. No acute fracture.

Soft tissues and spinal canal: Paraspinous soft tissues demonstrate
no acute abnormality. No abnormal prevertebral edema. Spinal canal
within normal limits. Advanced atherosclerotic plaque about the
carotid bifurcations.

Disc levels: Advanced multilevel facet arthropathy seen throughout
the cervical spine. Moderate multilevel degenerative spondylolysis,
most notable at C6-7.

Upper chest: Partially visualized upper chest demonstrates no acute
abnormality. Partially visualized lung apices are grossly clear.
Paraseptal emphysematous changes noted.

Other: None.
IMPRESSION: CT BRAIN:

1. No acute intracranial abnormality.
2. Moderate age-related cerebral atrophy with chronic small vessel
ischemic disease.

CT CERVICAL SPINE:

1. No acute traumatic injury within the cervical spine.
2. Levoscoliosis with moderate multilevel degenerative spondylolysis
and facet arthrosis.

## 2020-03-06 ENCOUNTER — Encounter: Payer: Self-pay | Admitting: Adult Health

## 2020-03-06 ENCOUNTER — Ambulatory Visit: Payer: Medicare Other | Admitting: Adult Health
